# Patient Record
Sex: Female | Born: 1940 | Hispanic: No | Smoking: Never smoker
Health system: Southern US, Community
[De-identification: ages and names within clinical notes are randomized; demographics above are authoritative.]

## PROBLEM LIST (undated history)

## (undated) DIAGNOSIS — I10 Essential (primary) hypertension: Secondary | ICD-10-CM

## (undated) DIAGNOSIS — I2510 Atherosclerotic heart disease of native coronary artery without angina pectoris: Secondary | ICD-10-CM

## (undated) DIAGNOSIS — I421 Obstructive hypertrophic cardiomyopathy: Secondary | ICD-10-CM

## (undated) DIAGNOSIS — M109 Gout, unspecified: Secondary | ICD-10-CM

## (undated) DIAGNOSIS — E785 Hyperlipidemia, unspecified: Secondary | ICD-10-CM

## (undated) DIAGNOSIS — M069 Rheumatoid arthritis, unspecified: Secondary | ICD-10-CM

## (undated) DIAGNOSIS — R079 Chest pain, unspecified: Secondary | ICD-10-CM

## (undated) HISTORY — DX: Atherosclerotic heart disease of native coronary artery without angina pectoris: I25.10

## (undated) HISTORY — DX: Hyperlipidemia, unspecified: E78.5

## (undated) HISTORY — DX: Essential (primary) hypertension: I10

## (undated) HISTORY — DX: Obstructive hypertrophic cardiomyopathy: I42.1

## (undated) HISTORY — DX: Chest pain, unspecified: R07.9

---

## 2011-07-06 DIAGNOSIS — I10 Essential (primary) hypertension: Secondary | ICD-10-CM | POA: Diagnosis present

## 2011-08-11 DIAGNOSIS — Z008 Encounter for other general examination: Secondary | ICD-10-CM | POA: Insufficient documentation

## 2016-06-10 ENCOUNTER — Encounter (HOSPITAL_COMMUNITY): Payer: Self-pay | Admitting: Emergency Medicine

## 2016-06-10 ENCOUNTER — Emergency Department (HOSPITAL_COMMUNITY)
Admission: EM | Admit: 2016-06-10 | Discharge: 2016-06-10 | Disposition: A | Discharge status: Home / Self Care | Payer: Medicare Other | Attending: Emergency Medicine | Admitting: Emergency Medicine

## 2016-06-10 DIAGNOSIS — M2550 Pain in unspecified joint: Secondary | ICD-10-CM

## 2016-06-10 DIAGNOSIS — H579 Unspecified disorder of eye and adnexa: Secondary | ICD-10-CM | POA: Diagnosis present

## 2016-06-10 DIAGNOSIS — Z7982 Long term (current) use of aspirin: Secondary | ICD-10-CM | POA: Diagnosis not present

## 2016-06-10 DIAGNOSIS — H1031 Unspecified acute conjunctivitis, right eye: Secondary | ICD-10-CM | POA: Insufficient documentation

## 2016-06-10 DIAGNOSIS — Z79899 Other long term (current) drug therapy: Secondary | ICD-10-CM | POA: Diagnosis not present

## 2016-06-10 HISTORY — DX: Gout, unspecified: M10.9

## 2016-06-10 MED ORDER — NAPROXEN 500 MG PO TABS: 500.0000 mg | ORAL_TABLET | Freq: Two times a day (BID) | ORAL | 0 refills | Status: DC

## 2016-06-10 MED ORDER — FLUORESCEIN SODIUM 0.6 MG OP STRP
1.0000 | ORAL_STRIP | Freq: Once | OPHTHALMIC | Status: AC
  Administered 2016-06-10: 1 via OPHTHALMIC
  Filled 2016-06-10: qty 1

## 2016-06-10 MED ORDER — TETRACAINE HCL 0.5 % OP SOLN
2.0000 [drp] | Freq: Once | OPHTHALMIC | Status: AC
Start: 2016-06-10 — End: 2016-06-10
  Administered 2016-06-10: 2 [drp] via OPHTHALMIC
  Filled 2016-06-10: qty 4

## 2016-06-10 MED ORDER — TOBRAMYCIN 0.3 % OP SOLN
1.0000 [drp] | Freq: Once | OPHTHALMIC | Status: AC
  Administered 2016-06-10: 1 [drp] via OPHTHALMIC
  Filled 2016-06-10: qty 5

## 2016-06-10 MED ORDER — KETOROLAC TROMETHAMINE 0.5 % OP SOLN
1.0000 [drp] | Freq: Once | OPHTHALMIC | Status: AC
  Administered 2016-06-10: 1 [drp] via OPHTHALMIC
  Filled 2016-06-10: qty 5

## 2016-06-10 NOTE — ED Triage Notes (Signed)
Pt is with son-in-law and do not speak englishRedness to right eye, rates pain 5/10.  Also c/o joint pain (fingers, knees and wrist), rates pain 4-5/10

## 2016-06-10 NOTE — Discharge Instructions (Signed)
Warm wet compresses on/off to your eye.  Apply one drop of the ketorolac to the right eye 4 times a day and one drop of the tobramycin every 4 hrs for 5-7 days.  Avoid rubbing your eye.  Call the eye doctor listed to arrange a follow-up.  You can contact one of the doctors listed to establish a primary care provider.

## 2016-06-10 NOTE — ED Provider Notes (Signed)
[facility] DEPT Provider Note   CSN: [id number] Arrival date & time: 06/10/16  1191     History   Chief Complaint Chief Complaint  Patient presents with  . Conjunctivitis    HPI Dana [name] is a 75 y.o. female.  HPI  Dana [name] is a 75 y.o. female who presents to the Emergency Department complaining of redness, drainage and itching of the right eye.  Symptoms have been present for months, but worsening for one week.  Pt moved here from [place] and has not found a PCP yet.  She has not used any drops to her eye.  She states she has to pry her eye open every morning and reports some blurred vision.  She denies headaches, fever, dizziness or vision loss.  She also requests pain control for recurrent pain to her hands, shoulders and fingers.  States that she has taken mobic without relief.  Denies swelling or redness of the joints   Past Medical History:  Diagnosis Date  . Gout   . Heart beat abnormality     There are no active problems to display for this patient.   History reviewed. No pertinent surgical history.  OB History    Gravida Para Term Preterm AB Living   7         7   SAB TAB Ectopic Multiple Live Births                   Home Medications    Prior to Admission medications   Medication Sig Start Date End Date Taking? Authorizing Provider  allopurinol (ZYLOPRIM) 300 MG tablet Take 300 mg by mouth daily.   Yes Historical Provider, MD  aspirin EC 81 MG tablet Take 81 mg by mouth daily.   Yes Historical Provider, MD  Cholecalciferol (VITAMIN D) 2000 units CAPS Take by mouth.   Yes Historical Provider, MD  ezetimibe (ZETIA) 10 MG tablet Take 10 mg by mouth daily.   Yes Historical Provider, MD  furosemide (LASIX) 40 MG tablet Take 40 mg by mouth.   Yes Historical Provider, MD  meloxicam (MOBIC) 15 MG tablet Take 15 mg by mouth daily.   Yes Historical Provider, MD  metoprolol (LOPRESSOR) 100 MG tablet Take 100 mg by mouth 2 (two) times daily.   Yes  Historical Provider, MD  potassium chloride (K-DUR,KLOR-CON) 10 MEQ tablet Take 10 mEq by mouth daily.   Yes Historical Provider, MD    Family History History reviewed. No pertinent family history.  Social History Social History  Substance Use Topics  . Smoking status: Never Smoker  . Smokeless tobacco: Never Used  . Alcohol use No     Allergies   Patient has no known allergies.   Review of Systems Review of Systems  Constitutional: Negative for chills and fever.  Eyes: Positive for pain, discharge, redness, itching and visual disturbance.  Genitourinary: Negative for difficulty urinating and dysuria.  Musculoskeletal: Positive for arthralgias (pain to both shoulders, hands and fingers). Negative for joint swelling.  Skin: Negative for color change, rash and wound.  Neurological: Negative for dizziness, syncope, weakness and headaches.  All other systems reviewed and are negative.    Physical Exam Updated Vital Signs BP 122/77 (BP Location: Left Arm)   Pulse 64   Temp 98.6 F (37 C) (Oral)   Resp 17   SpO2 99%   Physical Exam  Constitutional: She is oriented to person, place, and time. She appears well-developed and well-nourished. No distress.  HENT:  Head: Normocephalic and atraumatic.  Eyes: EOM are normal. Pupils are equal, round, and reactive to light. Lids are everted and swept, no foreign bodies found. Right eye exhibits chemosis and discharge. Right eye exhibits no hordeolum. No foreign body present in the right eye. Left eye exhibits no discharge. Right conjunctiva is injected. Left conjunctiva is not injected.  Neck: Normal range of motion. Neck supple. No thyromegaly present.  Cardiovascular: Normal rate, regular rhythm and intact distal pulses.   No murmur heard. Pulmonary/Chest: Effort normal and breath sounds normal. No respiratory distress.  Musculoskeletal: Normal range of motion. She exhibits tenderness. She exhibits no edema or deformity.  ttp of  the DIP joints of the bilateral hands and bialteral wrists and shoulders.  No erythema or edema.  Sensation intact.    Lymphadenopathy:    She has no cervical adenopathy.  Neurological: She is alert and oriented to person, place, and time. She exhibits normal muscle tone. Coordination normal.  Skin: Skin is warm and dry. No rash noted.  Nursing note and vitals reviewed.    ED Treatments / Results  Labs (all labs ordered are listed, but only abnormal results are displayed) Labs Reviewed - No data to display  EKG  EKG Interpretation None       Radiology No results found.  Procedures Procedures (including critical care time)  Medications Ordered in ED Medications  tobramycin (TOBREX) 0.3 % ophthalmic solution 1 drop (not administered)  ketorolac (ACULAR) 0.5 % ophthalmic solution 1 drop (not administered)  tetracaine (PONTOCAINE) 0.5 % ophthalmic solution 2 drop (2 drops Right Eye Given by Other 06/10/16 1130)  fluorescein ophthalmic strip 1 strip (1 strip Right Eye Given by Other 06/10/16 1130)     Initial Impression / Assessment and Plan / ED Course  I have reviewed the triage vital signs and the nursing notes.  Pertinent labs & imaging results that were available during my care of the patient were reviewed by me and considered in my medical decision making (see chart for details).  Clinical Course    Pt with conjunctivitis of the right eye.  Chemosis likely from frequent rubbing of her eye.    IOP of the right measured with tonopen:  13, 13, 12 mm Hg  No FB's or evidence of hordeolum.  Dispensed Acular and tobramycin.  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  rx given for naprosyn.  Referral info for local PCP and ophtho    Final Clinical Impressions(s) / ED Diagnoses   Final diagnoses:  Acute conjunctivitis of right eye, unspecified acute conjunctivitis type  Arthralgia, unspecified joint    New Prescriptions New Prescriptions   No medications on file     Rosey Bath 06/12/16 2159    Donnetta Hutching, MD 06/17/16 [phone]

## 2016-07-06 ENCOUNTER — Other Ambulatory Visit (HOSPITAL_COMMUNITY): Payer: Self-pay | Admitting: Internal Medicine

## 2016-07-06 DIAGNOSIS — B351 Tinea unguium: Secondary | ICD-10-CM | POA: Insufficient documentation

## 2016-07-06 DIAGNOSIS — H5704 Mydriasis: Secondary | ICD-10-CM | POA: Insufficient documentation

## 2016-07-06 DIAGNOSIS — Z7982 Long term (current) use of aspirin: Secondary | ICD-10-CM | POA: Insufficient documentation

## 2016-07-06 DIAGNOSIS — Z1231 Encounter for screening mammogram for malignant neoplasm of breast: Secondary | ICD-10-CM

## 2016-07-06 DIAGNOSIS — R0989 Other specified symptoms and signs involving the circulatory and respiratory systems: Secondary | ICD-10-CM | POA: Insufficient documentation

## 2016-07-06 DIAGNOSIS — R600 Localized edema: Secondary | ICD-10-CM | POA: Insufficient documentation

## 2016-07-07 ENCOUNTER — Other Ambulatory Visit (HOSPITAL_COMMUNITY): Payer: Self-pay | Admitting: Internal Medicine

## 2016-07-07 DIAGNOSIS — E785 Hyperlipidemia, unspecified: Secondary | ICD-10-CM

## 2016-07-07 DIAGNOSIS — R079 Chest pain, unspecified: Secondary | ICD-10-CM

## 2016-07-07 DIAGNOSIS — I10 Essential (primary) hypertension: Secondary | ICD-10-CM

## 2016-07-11 ENCOUNTER — Ambulatory Visit (HOSPITAL_COMMUNITY)
Admission: RE | Admit: 2016-07-11 | Discharge: 2016-07-11 | Disposition: A | Discharge status: Home / Self Care | Payer: Medicare Other | Source: Ambulatory Visit | Attending: Internal Medicine | Admitting: Internal Medicine

## 2016-07-11 DIAGNOSIS — R079 Chest pain, unspecified: Secondary | ICD-10-CM | POA: Diagnosis not present

## 2016-07-11 DIAGNOSIS — I10 Essential (primary) hypertension: Secondary | ICD-10-CM | POA: Insufficient documentation

## 2016-07-11 DIAGNOSIS — E785 Hyperlipidemia, unspecified: Secondary | ICD-10-CM | POA: Diagnosis not present

## 2016-07-11 DIAGNOSIS — I6523 Occlusion and stenosis of bilateral carotid arteries: Secondary | ICD-10-CM | POA: Diagnosis not present

## 2016-07-11 DIAGNOSIS — R600 Localized edema: Secondary | ICD-10-CM | POA: Insufficient documentation

## 2016-07-11 DIAGNOSIS — R011 Cardiac murmur, unspecified: Secondary | ICD-10-CM | POA: Diagnosis not present

## 2016-07-11 DIAGNOSIS — R0989 Other specified symptoms and signs involving the circulatory and respiratory systems: Secondary | ICD-10-CM | POA: Insufficient documentation

## 2016-07-18 ENCOUNTER — Ambulatory Visit (HOSPITAL_COMMUNITY): Payer: Medicare Other

## 2016-09-19 ENCOUNTER — Encounter: Payer: Self-pay | Admitting: Cardiology

## 2016-09-19 NOTE — Progress Notes (Signed)
Cardiology Office Note  Date: 09/21/2016   ID: Dana [name], Dana [name] 10-27-1940, MRN [id number]  PCP: [name], MD  Consulting Cardiologist: Nona Dell, MD   Chief Complaint  Patient presents with  . Heart Murmur    History of Present Illness: Dana [name] is a 76 y.o. female referred for cardiology consultation by Dr. Selena Batten for evaluation of heart murmur. She is here with her son today. She is a native of [place] in the [place], most recently lived with family in [place]. This past fall she and family members moved to this area to be closer to one of their daughters. She does not speak English well, her son translates in their native language. I do not have any details about her cardiac history as yet, apparently she did follow with a cardiologist, and it sounds like based on the patient's sons very limited description that she may have had a diagnosed hypertrophic cardiomyopathy.  Patient apparently has a history of recurring chest pain, has been managed with high-dose beta blocker, and actually since she has moved to this area has been having fewer symptoms. She is not particularly active, has NYHA class II dyspnea with basic ADLs. She does not specifically endorse any palpitations or syncope.  I personally reviewed her ECG today which shows sinus bradycardia with IVCD of left bundle-branch block type as well as increased voltage.  Past Medical History:  Diagnosis Date  . Arthritis   . Essential hypertension   . Gout   . Hyperlipidemia     History reviewed. No pertinent surgical history.  Current Outpatient Prescriptions  Medication Sig Dispense Refill  . allopurinol (ZYLOPRIM) 300 MG tablet Take 300 mg by mouth daily.    [name] aspirin EC 81 MG tablet Take 81 mg by mouth daily.    . diphenhydramine-acetaminophen (TYLENOL PM) 25-500 MG TABS tablet Take 1 tablet by mouth at bedtime as needed.    . furosemide (LASIX) 40 MG tablet Take 40 mg by mouth.    .  methotrexate 2.5 MG tablet 4 tabs once a week    . metoprolol (LOPRESSOR) 100 MG tablet Take 100 mg by mouth 2 (two) times daily.    . nitroGLYCERIN (NITROSTAT) 0.4 MG SL tablet Place 0.4 mg under the tongue every 5 (five) minutes as needed for chest pain.    . predniSONE (STERAPRED UNI-PAK 21 TAB) 5 MG (21) TBPK tablet Take 5 mg by mouth daily.    [name] ezetimibe (ZETIA) 10 MG tablet Take 10 mg by mouth daily.    . meloxicam (MOBIC) 15 MG tablet Take 15 mg by mouth daily.    . potassium chloride (K-DUR,KLOR-CON) 10 MEQ tablet Take 10 mEq by mouth daily.     No current facility-administered medications for this visit.    Allergies:  Patient has no known allergies.   Social History: The patient  reports that she has never smoked. She has never used smokeless tobacco. She reports that she does not drink alcohol or use drugs.   Family History: The patient's family history includes Hypertension in her mother.   ROS:  Please see the history of present illness. Otherwise, complete review of systems is positive for arthritic pains.  All other systems are reviewed and negative.   Physical Exam: VS:  BP (!) 143/75 (BP Location: Right Arm, Cuff Size: Large)   Pulse (!) 53   Ht 5\' 2"  (1.575 m)   Wt 173 lb 9.6 oz (78.7 kg)   BMI 31.75  kg/m , BMI Body mass index is 31.75 kg/m.  Wt Readings from Last 3 Encounters:  09/21/16 173 lb 9.6 oz (78.7 kg)    General: Obese woman, appears comfortable at rest. HEENT: Conjunctiva and lids normal, oropharynx clear. Neck: Supple, no elevated JVP or carotid bruits, no thyromegaly. Lungs: Clear to auscultation, nonlabored breathing at rest. Cardiac: Regular rate and rhythm, no S3, 3/6 systolic murmur best heard at the right base, no pericardial rub. Abdomen: Soft, nontender, bowel sounds present, no guarding or rebound. Extremities: No pitting edema, distal pulses 2+. Skin: Warm and dry. Musculoskeletal: No kyphosis. Neuropsychiatric: Alert and oriented x3,  affect grossly appropriate.  ECG: No prior tracing available for comparison.  Recent Labwork:  February 2018: Cholesterol 169, triglycerides 87, HDL 47, LDL 105  Other Studies Reviewed Today:  Carotid Dopplers 07/11/2016: PRESSION: Minor carotid atherosclerosis. No hemodynamically significant ICA stenosis. Degree of narrowing less than 50% bilaterally.  Patent antegrade vertebral flow bilaterally  Assessment and Plan:  1. Heart murmur and potential history of hypertrophic cardiomyopathy. No records regarding prior cardiac workup available as yet, will be requested when the patient's son brings in this contact information. In the meanwhile we will obtain an echocardiogram to assess cardiac structure and function, can then decide about follow-up disposition.  2. Essential hypertension by history. She is currently on Lopressor, also takes Lasix with potassium supplements.  3. Hyperlipidemia, recent LDL 105. She is on Zetia.  4. History of arthritis and gout. Currently established with Dr. Selena Batten.  Current medicines were reviewed with the patient today.   Orders Placed This Encounter  Procedures  . EKG 12-Lead  . ECHOCARDIOGRAM COMPLETE    Disposition: Review records and determine follow-up plan.  Signed, Jonelle Sidle, MD, [facility] 09/21/2016 8:57 AM    [facility] at [facility] [address] [place], [place] [address] Phone: [phone]; Fax: [phone]

## 2016-09-21 ENCOUNTER — Encounter: Payer: Self-pay | Admitting: Cardiology

## 2016-09-21 ENCOUNTER — Ambulatory Visit (INDEPENDENT_AMBULATORY_CARE_PROVIDER_SITE_OTHER): Payer: Medicare Other | Admitting: Cardiology

## 2016-09-21 VITALS — BP 143/75 | HR 53 | Ht 62.0 in | Wt 173.6 lb

## 2016-09-21 DIAGNOSIS — I422 Other hypertrophic cardiomyopathy: Secondary | ICD-10-CM | POA: Diagnosis not present

## 2016-09-21 DIAGNOSIS — R011 Cardiac murmur, unspecified: Secondary | ICD-10-CM | POA: Diagnosis not present

## 2016-09-21 DIAGNOSIS — I10 Essential (primary) hypertension: Secondary | ICD-10-CM

## 2016-09-21 DIAGNOSIS — E782 Mixed hyperlipidemia: Secondary | ICD-10-CM | POA: Diagnosis not present

## 2016-09-21 NOTE — Patient Instructions (Signed)
Your physician recommends that you schedule a follow-up appointment to be determined   Your physician recommends that you continue on your current medications as directed. Please refer to the Current Medication list given to you today.  Your physician has requested that you have an echocardiogram. Echocardiography is a painless test that uses sound waves to create images of your heart. It provides your doctor with information about the size and shape of your heart and how well your heart's chambers and valves are working. This procedure takes approximately one hour. There are no restrictions for this procedure.  If you need a refill on your cardiac medications before your next appointment, please call your pharmacy.  Thank you for choosing [facility]!

## 2016-09-27 ENCOUNTER — Ambulatory Visit (HOSPITAL_COMMUNITY)
Admission: RE | Admit: 2016-09-27 | Discharge: 2016-09-27 | Disposition: A | Discharge status: Home / Self Care | Payer: Medicare Other | Source: Ambulatory Visit | Attending: Cardiology | Admitting: Cardiology

## 2016-09-27 DIAGNOSIS — I081 Rheumatic disorders of both mitral and tricuspid valves: Secondary | ICD-10-CM | POA: Diagnosis not present

## 2016-09-27 DIAGNOSIS — R011 Cardiac murmur, unspecified: Secondary | ICD-10-CM | POA: Diagnosis not present

## 2016-09-27 LAB — ECHOCARDIOGRAM COMPLETE
AO mean calculated velocity dopler: 237 cm/s
AOVTI: 92.2 cm
AV Mean grad: 30 mmHg
AV Peak grad: 83 mmHg
AVPKVEL: 455 cm/s
CHL CUP MV DEC (S): 278
CHL CUP STROKE VOLUME: 39 mL
E decel time: 278 msec
EERAT: 20.65
FS: 31 % (ref 28–44)
IV/PV OW: 1.4
LA diam end sys: 40 mm
LA diam index: 2.12 cm/m2
LA vol A4C: 62.9 ml
LASIZE: 40 mm
LAVOL: 60 mL
LAVOLIN: 31.8 mL/m2
LV E/e' medial: 20.65
LV TDI E'LATERAL: 4.78
LV e' LATERAL: 4.78 cm/s
LV sys vol index: 7 mL/m2
LV sys vol: 14 mL (ref 14–42)
LVDIAVOL: 53 mL (ref 46–106)
LVDIAVOLIN: 28 mL/m2
LVEEAVG: 20.65
LVOT area: 2.27 cm2
LVOT diameter: 17 mm
Lateral S' vel: 8.35 cm/s
MV Peak grad: 4 mmHg
MV pk E vel: 98.7 m/s
MVPKAVEL: 67.2 m/s
PW: 14.1 mm — AB (ref 0.6–1.1)
RV TAPSE: 15.9 mm
RV sys press: 28 mmHg
Reg peak vel: 251 cm/s
Simpson's disk: 74
TDI e' medial: 3.81
TR max vel: 251 cm/s

## 2016-09-27 NOTE — Progress Notes (Signed)
*  PRELIMINARY RESULTS* Echocardiogram 2D Echocardiogram has been performed.  Stacey Drain 09/27/2016, 1:35 PM

## 2016-12-01 ENCOUNTER — Ambulatory Visit (HOSPITAL_COMMUNITY): Payer: Medicare Other

## 2016-12-07 ENCOUNTER — Ambulatory Visit (HOSPITAL_COMMUNITY)
Admission: RE | Admit: 2016-12-07 | Discharge: 2016-12-07 | Disposition: A | Discharge status: Home / Self Care | Payer: Medicare Other | Source: Ambulatory Visit | Attending: Internal Medicine | Admitting: Internal Medicine

## 2016-12-07 DIAGNOSIS — Z1231 Encounter for screening mammogram for malignant neoplasm of breast: Secondary | ICD-10-CM

## 2016-12-09 ENCOUNTER — Other Ambulatory Visit (HOSPITAL_COMMUNITY): Payer: Self-pay | Admitting: Internal Medicine

## 2016-12-09 DIAGNOSIS — M79662 Pain in left lower leg: Secondary | ICD-10-CM

## 2016-12-12 ENCOUNTER — Ambulatory Visit (HOSPITAL_COMMUNITY)
Admission: RE | Admit: 2016-12-12 | Discharge: 2016-12-12 | Disposition: A | Discharge status: Home / Self Care | Payer: Medicare Other | Source: Ambulatory Visit | Attending: Internal Medicine | Admitting: Internal Medicine

## 2016-12-12 DIAGNOSIS — M79605 Pain in left leg: Secondary | ICD-10-CM | POA: Diagnosis not present

## 2016-12-12 DIAGNOSIS — M79662 Pain in left lower leg: Secondary | ICD-10-CM

## 2017-01-19 ENCOUNTER — Encounter (INDEPENDENT_AMBULATORY_CARE_PROVIDER_SITE_OTHER): Payer: Medicare Other | Admitting: Ophthalmology

## 2017-01-19 DIAGNOSIS — I10 Essential (primary) hypertension: Secondary | ICD-10-CM | POA: Diagnosis not present

## 2017-01-19 DIAGNOSIS — H33303 Unspecified retinal break, bilateral: Secondary | ICD-10-CM | POA: Diagnosis not present

## 2017-01-19 DIAGNOSIS — H35033 Hypertensive retinopathy, bilateral: Secondary | ICD-10-CM | POA: Diagnosis not present

## 2017-01-19 DIAGNOSIS — H43813 Vitreous degeneration, bilateral: Secondary | ICD-10-CM

## 2017-01-29 ENCOUNTER — Observation Stay (HOSPITAL_COMMUNITY)
Admission: EM | Admit: 2017-01-29 | Discharge: 2017-01-30 | Disposition: A | Discharge status: Home / Self Care | Payer: Medicare Other | Attending: Internal Medicine | Admitting: Internal Medicine

## 2017-01-29 ENCOUNTER — Emergency Department (HOSPITAL_COMMUNITY): Payer: Medicare Other

## 2017-01-29 ENCOUNTER — Encounter (HOSPITAL_COMMUNITY): Payer: Self-pay | Admitting: Cardiology

## 2017-01-29 DIAGNOSIS — M1990 Unspecified osteoarthritis, unspecified site: Secondary | ICD-10-CM | POA: Diagnosis present

## 2017-01-29 DIAGNOSIS — I422 Other hypertrophic cardiomyopathy: Secondary | ICD-10-CM

## 2017-01-29 DIAGNOSIS — R079 Chest pain, unspecified: Secondary | ICD-10-CM | POA: Diagnosis not present

## 2017-01-29 DIAGNOSIS — R110 Nausea: Secondary | ICD-10-CM | POA: Diagnosis not present

## 2017-01-29 DIAGNOSIS — Z79899 Other long term (current) drug therapy: Secondary | ICD-10-CM | POA: Insufficient documentation

## 2017-01-29 DIAGNOSIS — Z7982 Long term (current) use of aspirin: Secondary | ICD-10-CM | POA: Insufficient documentation

## 2017-01-29 DIAGNOSIS — D649 Anemia, unspecified: Secondary | ICD-10-CM | POA: Diagnosis present

## 2017-01-29 DIAGNOSIS — I10 Essential (primary) hypertension: Secondary | ICD-10-CM | POA: Diagnosis not present

## 2017-01-29 DIAGNOSIS — M109 Gout, unspecified: Secondary | ICD-10-CM | POA: Diagnosis present

## 2017-01-29 DIAGNOSIS — E782 Mixed hyperlipidemia: Secondary | ICD-10-CM | POA: Diagnosis present

## 2017-01-29 DIAGNOSIS — E785 Hyperlipidemia, unspecified: Secondary | ICD-10-CM

## 2017-01-29 DIAGNOSIS — R0602 Shortness of breath: Secondary | ICD-10-CM | POA: Insufficient documentation

## 2017-01-29 DIAGNOSIS — R42 Dizziness and giddiness: Secondary | ICD-10-CM | POA: Diagnosis not present

## 2017-01-29 DIAGNOSIS — I340 Nonrheumatic mitral (valve) insufficiency: Secondary | ICD-10-CM | POA: Diagnosis not present

## 2017-01-29 LAB — COMPREHENSIVE METABOLIC PANEL
ALT: 9 U/L — ABNORMAL LOW (ref 14–54)
AST: 18 U/L (ref 15–41)
Albumin: 3.4 g/dL — ABNORMAL LOW (ref 3.5–5.0)
Alkaline Phosphatase: 73 U/L (ref 38–126)
Anion gap: 7 (ref 5–15)
BILIRUBIN TOTAL: 0.7 mg/dL (ref 0.3–1.2)
BUN: 23 mg/dL — AB (ref 6–20)
CO2: 23 mmol/L (ref 22–32)
CREATININE: 0.77 mg/dL (ref 0.44–1.00)
Calcium: 8.9 mg/dL (ref 8.9–10.3)
Chloride: 105 mmol/L (ref 101–111)
Glucose, Bld: 166 mg/dL — ABNORMAL HIGH (ref 65–99)
POTASSIUM: 3.6 mmol/L (ref 3.5–5.1)
SODIUM: 135 mmol/L (ref 135–145)
TOTAL PROTEIN: 7.3 g/dL (ref 6.5–8.1)

## 2017-01-29 LAB — CBC WITH DIFFERENTIAL/PLATELET
Basophils Absolute: 0 10*3/uL (ref 0.0–0.1)
Basophils Relative: 1 %
EOS ABS: 0.1 10*3/uL (ref 0.0–0.7)
EOS PCT: 2 %
HCT: 32.1 % — ABNORMAL LOW (ref 36.0–46.0)
Hemoglobin: 10.5 g/dL — ABNORMAL LOW (ref 12.0–15.0)
LYMPHS ABS: 1.4 10*3/uL (ref 0.7–4.0)
Lymphocytes Relative: 26 %
MCH: 28.5 pg (ref 26.0–34.0)
MCHC: 32.7 g/dL (ref 30.0–36.0)
MCV: 87.2 fL (ref 78.0–100.0)
MONOS PCT: 6 %
Monocytes Absolute: 0.3 10*3/uL (ref 0.1–1.0)
Neutro Abs: 3.7 10*3/uL (ref 1.7–7.7)
Neutrophils Relative %: 67 %
PLATELETS: 287 10*3/uL (ref 150–400)
RBC: 3.68 MIL/uL — ABNORMAL LOW (ref 3.87–5.11)
RDW: 15.1 % (ref 11.5–15.5)
WBC: 5.5 10*3/uL (ref 4.0–10.5)

## 2017-01-29 LAB — I-STAT TROPONIN, ED: TROPONIN I, POC: 0 ng/mL (ref 0.00–0.08)

## 2017-01-29 LAB — TROPONIN I

## 2017-01-29 LAB — D-DIMER, QUANTITATIVE: D-Dimer, Quant: 4.69 ug/mL-FEU — ABNORMAL HIGH (ref 0.00–0.50)

## 2017-01-29 LAB — MAGNESIUM: MAGNESIUM: 2.1 mg/dL (ref 1.7–2.4)

## 2017-01-29 LAB — RETICULOCYTES
RBC.: 3.49 MIL/uL — ABNORMAL LOW (ref 3.87–5.11)
Retic Count, Absolute: 48.9 10*3/uL (ref 19.0–186.0)
Retic Ct Pct: 1.4 % (ref 0.4–3.1)

## 2017-01-29 LAB — BRAIN NATRIURETIC PEPTIDE: B Natriuretic Peptide: 372 pg/mL — ABNORMAL HIGH (ref 0.0–100.0)

## 2017-01-29 LAB — CBG MONITORING, ED: GLUCOSE-CAPILLARY: 179 mg/dL — AB (ref 65–99)

## 2017-01-29 LAB — TSH: TSH: 1.045 u[IU]/mL (ref 0.350–4.500)

## 2017-01-29 MED ORDER — ORAL CARE MOUTH RINSE
15.0000 mL | Freq: Two times a day (BID) | OROMUCOSAL | Status: DC
  Administered 2017-01-29: 15 mL via OROMUCOSAL

## 2017-01-29 MED ORDER — ATORVASTATIN CALCIUM 10 MG PO TABS: 10.0000 mg | ORAL_TABLET | Freq: Every day | ORAL | Status: DC

## 2017-01-29 MED ORDER — SODIUM CHLORIDE 0.9 % IV BOLUS (SEPSIS)
1000.0000 mL | Freq: Once | INTRAVENOUS | Status: AC
  Administered 2017-01-29: 1000 mL via INTRAVENOUS

## 2017-01-29 MED ORDER — ALLOPURINOL 300 MG PO TABS
300.0000 mg | ORAL_TABLET | Freq: Every day | ORAL | Status: DC
  Administered 2017-01-30: 300 mg via ORAL
  Filled 2017-01-29: qty 1

## 2017-01-29 MED ORDER — ASPIRIN 81 MG PO CHEW: 324.0000 mg | CHEWABLE_TABLET | Freq: Once | ORAL | Status: DC

## 2017-01-29 MED ORDER — ONDANSETRON HCL 4 MG/2ML IJ SOLN: 4.0000 mg | Freq: Four times a day (QID) | INTRAMUSCULAR | Status: DC | PRN

## 2017-01-29 MED ORDER — NITROGLYCERIN 0.4 MG SL SUBL: 0.4000 mg | SUBLINGUAL_TABLET | SUBLINGUAL | Status: DC | PRN

## 2017-01-29 MED ORDER — KETOROLAC TROMETHAMINE 15 MG/ML IJ SOLN: 15.0000 mg | Freq: Three times a day (TID) | INTRAMUSCULAR | Status: DC | PRN

## 2017-01-29 MED ORDER — ACETAMINOPHEN ER 650 MG PO TBCR: 650.0000 mg | EXTENDED_RELEASE_TABLET | Freq: Three times a day (TID) | ORAL | Status: DC | PRN

## 2017-01-29 MED ORDER — SODIUM CHLORIDE 0.9% FLUSH: 3.0000 mL | INTRAVENOUS | Status: DC | PRN

## 2017-01-29 MED ORDER — SODIUM CHLORIDE 0.9 % IV BOLUS (SEPSIS)
500.0000 mL | Freq: Once | INTRAVENOUS | Status: AC
  Administered 2017-01-29: 500 mL via INTRAVENOUS

## 2017-01-29 MED ORDER — MORPHINE SULFATE (PF) 2 MG/ML IV SOLN: 2.0000 mg | INTRAVENOUS | Status: DC | PRN

## 2017-01-29 MED ORDER — KETOROLAC TROMETHAMINE 30 MG/ML IJ SOLN
15.0000 mg | Freq: Once | INTRAMUSCULAR | Status: AC
  Administered 2017-01-29: 15 mg via INTRAVENOUS
  Filled 2017-01-29: qty 1

## 2017-01-29 MED ORDER — ACETAMINOPHEN 325 MG PO TABS: 650.0000 mg | ORAL_TABLET | ORAL | Status: DC | PRN

## 2017-01-29 MED ORDER — IOPAMIDOL (ISOVUE-370) INJECTION 76%
100.0000 mL | Freq: Once | INTRAVENOUS | Status: AC | PRN
  Administered 2017-01-29: 100 mL via INTRAVENOUS

## 2017-01-29 MED ORDER — METOPROLOL TARTRATE 50 MG PO TABS
100.0000 mg | ORAL_TABLET | Freq: Two times a day (BID) | ORAL | Status: DC
Start: 2017-01-29 — End: 2017-01-30
  Administered 2017-01-29 – 2017-01-30 (×2): 100 mg via ORAL
  Filled 2017-01-29 (×2): qty 2

## 2017-01-29 MED ORDER — PREDNISOLONE ACETATE 1 % OP SUSP
1.0000 [drp] | Freq: Two times a day (BID) | OPHTHALMIC | Status: DC
  Administered 2017-01-30: 1 [drp] via OPHTHALMIC
  Filled 2017-01-29: qty 1

## 2017-01-29 MED ORDER — ASPIRIN EC 81 MG PO TBEC
81.0000 mg | DELAYED_RELEASE_TABLET | Freq: Every day | ORAL | Status: DC
  Administered 2017-01-30: 81 mg via ORAL
  Filled 2017-01-29: qty 1

## 2017-01-29 MED ORDER — SODIUM CHLORIDE 0.9% FLUSH
3.0000 mL | Freq: Two times a day (BID) | INTRAVENOUS | Status: DC
  Administered 2017-01-29: 3 mL via INTRAVENOUS

## 2017-01-29 MED ORDER — ENOXAPARIN SODIUM 40 MG/0.4ML ~~LOC~~ SOLN
40.0000 mg | SUBCUTANEOUS | Status: DC
  Administered 2017-01-29: 40 mg via SUBCUTANEOUS
  Filled 2017-01-29: qty 0.4

## 2017-01-29 MED ORDER — CYCLOSPORINE 0.05 % OP EMUL
1.0000 [drp] | Freq: Two times a day (BID) | OPHTHALMIC | Status: DC
  Administered 2017-01-29 – 2017-01-30 (×2): 1 [drp] via OPHTHALMIC
  Filled 2017-01-29 (×2): qty 1

## 2017-01-29 MED ORDER — FOLIC ACID 1 MG PO TABS
1.0000 mg | ORAL_TABLET | Freq: Every day | ORAL | Status: DC
  Administered 2017-01-29 – 2017-01-30 (×2): 1 mg via ORAL
  Filled 2017-01-29 (×2): qty 1

## 2017-01-29 MED ORDER — FENTANYL CITRATE (PF) 100 MCG/2ML IJ SOLN
25.0000 ug | Freq: Once | INTRAMUSCULAR | Status: AC
  Administered 2017-01-29: 25 ug via INTRAVENOUS
  Filled 2017-01-29: qty 2

## 2017-01-29 MED ORDER — SODIUM CHLORIDE 0.9 % IV SOLN: 250.0000 mL | INTRAVENOUS | Status: DC | PRN

## 2017-01-29 MED ORDER — PANTOPRAZOLE SODIUM 40 MG PO TBEC
40.0000 mg | DELAYED_RELEASE_TABLET | Freq: Every day | ORAL | Status: DC
  Administered 2017-01-29 – 2017-01-30 (×2): 40 mg via ORAL
  Filled 2017-01-29 (×2): qty 1

## 2017-01-29 NOTE — ED Triage Notes (Signed)
Chest pain today that started after taking tylenol.  Pt took 6 baby aspirin at home.  EMS gave 1 SL NTG with pain relief.  B/p 86/38 after ntg. Rates pain 7/10 now. 12 lead EKG normal with EMS.

## 2017-01-29 NOTE — ED Provider Notes (Addendum)
[facility] DEPT Provider Note   CSN: [id number] Arrival date & time: 01/29/17  1221     History   Chief Complaint No chief complaint on file.   HPI Dana [name] is a 76 y.o. female.  HPI   76 year old female with past medical history of gout, hyperlipidemia, hypertension and history of multiple episodes of chest pain with her last cath last year that was reportedly clean without intervention but I do not have those records. She had acute onset of left-sided chest pain that was sharp and pressure about 5 minutes after eating and taking her medications. She immediately called EMS for help and they gave her nitroglycerin which relieved her pain a little bit. She is very taken 6 low dose aspirin today prior to arrival. Using her son as an interpreter she does endorse shortness of breath, nausea and lightheadedness but no diaphoresis with this episode. No history of blood clots. This is similar to previous episodes. No other associated modifying symptoms.  Past Medical History:  Diagnosis Date  . Arthritis   . Essential hypertension   . Gout   . Heart abnormality   . HTN (hypertension) 01/29/2017  . Hyperlipidemia     Patient Active Problem List   Diagnosis Date Noted  . Chest pain 01/29/2017  . Hypertrophic cardiomyopathy (HCC) 01/29/2017  . Moderate to severe mitral regurgitation 01/29/2017  . HTN (hypertension) 01/29/2017  . Hyperlipidemia 01/29/2017  . Gout 01/29/2017  . Arthritis 01/29/2017  . Anemia 01/29/2017    History reviewed. No pertinent surgical history.  OB History    Gravida Para Term Preterm AB Living   7         7   SAB TAB Ectopic Multiple Live Births                   Home Medications    Prior to Admission medications   Medication Sig Start Date End Date Taking? Authorizing Provider  acetaminophen (TYLENOL 8 HOUR ARTHRITIS PAIN) 650 MG CR tablet Take 650 mg by mouth every 8 (eight) hours as needed for pain.   Yes [provider]    allopurinol (ZYLOPRIM) 300 MG tablet Take 300 mg by mouth daily.   Yes [provider]  aspirin EC 81 MG tablet Take 81 mg by mouth daily.   Yes [provider]  atorvastatin (LIPITOR) 10 MG tablet Take 10 mg by mouth daily.   Yes [provider]  folic acid (FOLVITE) 1 MG tablet Take 1 mg by mouth daily.   Yes [provider]  furosemide (LASIX) 40 MG tablet Take 40 mg by mouth daily as needed for fluid.    Yes [provider]  metoprolol (LOPRESSOR) 100 MG tablet Take 100 mg by mouth 2 (two) times daily.   Yes [provider]  nitroGLYCERIN (NITROSTAT) 0.4 MG SL tablet Place 0.4 mg under the tongue every 5 (five) minutes as needed for chest pain.   Yes [provider]  prednisoLONE acetate (PRED FORTE) 1 % ophthalmic suspension PLACE ONE DROP INTO THE RIGHT EYE TWICE DAILY 01/20/17  Yes [provider]  RESTASIS MULTIDOSE 0.05 % ophthalmic emulsion PLACE ONE DROP IN BOTH EYES TWICE DAILY 12/26/16  Yes [provider]    Family History Family History  Problem Relation Age of Onset  . Hypertension Mother   . Liver disease Mother   . Alcohol abuse Father     Social History Social History  Substance Use Topics  . Smoking  status: Never Smoker  . Smokeless tobacco: Never Used  . Alcohol use No     Allergies   Patient has no known allergies.   Review of Systems Review of Systems  All other systems reviewed and are negative.    Physical Exam Updated Vital Signs BP 112/69 (BP Location: Right Arm)   Pulse 71   Temp 97.6 F (36.4 C) (Oral)   Resp 18   Ht 5\' 3"  (1.6 m)   Wt 78.5 kg (173 lb)   SpO2 100%   BMI 30.65 kg/m   Physical Exam  Constitutional: She is oriented to person, place, and time. She appears well-developed and well-nourished.  HENT:  Head: Normocephalic and atraumatic.  Eyes: Conjunctivae and EOM are normal.  Neck: Normal range of motion.  Cardiovascular: Normal rate and  regular rhythm.   Murmur heard. Pulmonary/Chest: Breath sounds normal. No stridor. Tachypnea noted. No respiratory distress. She has no wheezes.  Abdominal: Soft. She exhibits no distension.  Musculoskeletal: Normal range of motion. She exhibits tenderness (left chest reproducing her pain). She exhibits no edema.  Neurological: She is alert and oriented to person, place, and time. No cranial nerve deficit. Coordination normal.  Skin: Skin is warm and dry.  Nursing note and vitals reviewed.    ED Treatments / Results  Labs (all labs ordered are listed, but only abnormal results are displayed) Labs Reviewed  CBC WITH DIFFERENTIAL/PLATELET - Abnormal; Notable for the following:       Result Value   RBC 3.68 (*)    Hemoglobin 10.5 (*)    HCT 32.1 (*)    All other components within normal limits  COMPREHENSIVE METABOLIC PANEL - Abnormal; Notable for the following:    Glucose, Bld 166 (*)    BUN 23 (*)    Albumin 3.4 (*)    ALT 9 (*)    All other components within normal limits  D-DIMER, QUANTITATIVE (NOT AT [facility]) - Abnormal; Notable for the following:    D-Dimer, Quant 4.69 (*)    All other components within normal limits  BRAIN NATRIURETIC PEPTIDE - Abnormal; Notable for the following:    B Natriuretic Peptide 372.0 (*)    All other components within normal limits  RETICULOCYTES - Abnormal; Notable for the following:    RBC. 3.49 (*)    All other components within normal limits  CBG MONITORING, ED - Abnormal; Notable for the following:    Glucose-Capillary 179 (*)    All other components within normal limits  TROPONIN I  TROPONIN I  TSH  MAGNESIUM  CBC  TROPONIN I  VITAMIN B12  FOLATE  IRON AND TIBC  FERRITIN  BASIC METABOLIC PANEL  LIPID PANEL  CBC  PROTIME-INR  TROPONIN I  I-STAT TROPONIN, ED    EKG  EKG Interpretation  Date/Time:  Sunday January 29 2017 12:34:17 EDT Ventricular Rate:  86 PR Interval:    QRS Duration: 104 QT Interval:  400 QTC  Calculation: 479 R Axis:   19 Text Interpretation:  Sinus rhythm Probable left atrial enlargement RSR' in V1 or V2, right VCD or RVH LVH with secondary repolarization abnormality No old tracing to compare Confirmed by Marily Memos [phone]) on 01/29/2017 3:48:47 PM       Radiology Dg Chest 2 View  Result Date: 01/29/2017 CLINICAL DATA:  CHEST PAIN, Chest pain today that started after taking Tylenol. Pt took 6 baby aspirin at home PATIENT DOESN'T SPEAK ENGLISH, DAUGHTER INTERPRETED FOR HER, DAUGHTER STATES " CHEST PAIN  STARTED EARLIER TODAY, NO OTHER SYMPTOMS" H EXAM: CHEST  2 VIEW COMPARISON:  None. FINDINGS: The heart size and mediastinal contours are within normal limits. Both lungs are clear. Degenerative changes are seen in thoracic spine. IMPRESSION: No evidence for acute cardiopulmonary abnormality. Electronically Signed   By: Norva Pavlov M.D.   On: 01/29/2017 13:48   Ct Angio Chest Pe W And/or Wo Contrast  Result Date: 01/29/2017 CLINICAL DATA:  Chest pain today. Elevated D-dimer. History of hypertension. EXAM: CT ANGIOGRAPHY CHEST WITH CONTRAST TECHNIQUE: Multidetector CT imaging of the chest was performed using the standard protocol during bolus administration of intravenous contrast. Multiplanar CT image reconstructions and MIPs were obtained to evaluate the vascular anatomy. CONTRAST:  100 cc Isovue 370 COMPARISON:  None. FINDINGS: Cardiovascular: Study slightly limited by patient motion artifact but there is no pulmonary embolism identified within main, lobar or segmental pulmonary arteries bilaterally. No aortic aneurysm or dissection. Mild atherosclerotic change at the aortic arch. Mild cardiomegaly. No pericardial effusion. Coronary artery calcifications noted. Mediastinum/Nodes: Heterogeneously partially calcified mass within the right thyroid lobe measuring approximately 3 x 3 cm. Small lymph nodes within mediastinum, none of which appear to be pathologic by CT size criteria.  Additional small lymph nodes noted within the axillary regions bilaterally. Esophagus appears normal. Trachea and central bronchi are unremarkable. Lungs/Pleura: Patchy ground-glass opacities within each lung, suggesting edema. No pleural effusion or pneumothorax seen. Upper Abdomen: No acute abnormality. Musculoskeletal: Mild degenerative spurring within the thoracic spine. No acute or suspicious osseous finding. Review of the MIP images confirms the above findings. IMPRESSION: 1. No pulmonary embolism identified. 2. Mild cardiomegaly with perihilar and bibasilar edema suggesting mild CHF/volume overload. No evidence of pneumonia. 3. Coronary artery calcifications, particularly dense within the left anterior descending coronary artery. Recommend correlation with any possible associated cardiac symptoms. 4. Heterogeneous, partially calcified, mass within the right thyroid lobe measuring measuring 3 x 3 cm. Per consensus guidelines, nonemergent thyroid ultrasound is recommended. Aortic Atherosclerosis (ICD10-I70.0). Electronically Signed   By: Bary Richard M.D.   On: 01/29/2017 16:01    Procedures Procedures (including critical care time)  Medications Ordered in ED Medications  aspirin chewable tablet 324 mg (324 mg Oral Not Given 01/29/17 1254)  pantoprazole (PROTONIX) EC tablet 40 mg (40 mg Oral Given 01/29/17 1838)  atorvastatin (LIPITOR) tablet 10 mg (not administered)  folic acid (FOLVITE) tablet 1 mg (1 mg Oral Given 01/29/17 1838)  prednisoLONE acetate (PRED FORTE) 1 % ophthalmic suspension 1 drop (1 drop Right Eye Not Given 01/29/17 2112)  cycloSPORINE (RESTASIS) 0.05 % ophthalmic emulsion 1 drop (1 drop Both Eyes Given 01/29/17 2110)  allopurinol (ZYLOPRIM) tablet 300 mg (not administered)  aspirin EC tablet 81 mg (not administered)  metoprolol tartrate (LOPRESSOR) tablet 100 mg (100 mg Oral Given 01/29/17 2109)  nitroGLYCERIN (NITROSTAT) SL tablet 0.4 mg (not administered)  acetaminophen (TYLENOL)  tablet 650 mg (not administered)  ondansetron (ZOFRAN) injection 4 mg (not administered)  enoxaparin (LOVENOX) injection 40 mg (40 mg Subcutaneous Given 01/29/17 1838)  sodium chloride flush (NS) 0.9 % injection 3 mL (3 mLs Intravenous Given 01/29/17 2111)  sodium chloride flush (NS) 0.9 % injection 3 mL (not administered)  0.9 %  sodium chloride infusion (not administered)  ketorolac (TORADOL) 15 MG/ML injection 15 mg (not administered)  morphine 2 MG/ML injection 2 mg (not administered)  MEDLINE mouth rinse (15 mLs Mouth Rinse Given 01/29/17 2200)  sodium chloride 0.9 % bolus 500 mL (0 mLs Intravenous Stopped 01/29/17 1435)  ketorolac (TORADOL) 30 MG/ML injection 15 mg (15 mg Intravenous Given 01/29/17 1248)  sodium chloride 0.9 % bolus 1,000 mL (0 mLs Intravenous Stopped 01/29/17 1620)  fentaNYL (SUBLIMAZE) injection 25 mcg (25 mcg Intravenous Given 01/29/17 1518)  iopamidol (ISOVUE-370) 76 % injection 100 mL (100 mLs Intravenous Contrast Given 01/29/17 1529)     Initial Impression / Assessment and Plan / ED Course  I have reviewed the triage vital signs and the nursing notes.  Pertinent labs & imaging results that were available during my care of the patient were reviewed by me and considered in my medical decision making (see chart for details).  The recent clean cath makes ACS unlikely however with her age and risk factors we'll do a delta troponins. Also possible concern would be a pulmonary embolus so we'll do a d-dimer as she seemed to be low risk. It also be GI related with this close proximity to taking this pill and eating lunch but will rule out other emergencies first.  PE scan negative. Still with some pain but improving. BP's improved with fluids.  High risk for ACS with obesity, HTN, age, description of symptoms. Will d/w hospitalist regarding ACS rule out observation.       Final Clinical Impressions(s) / ED Diagnoses   Final diagnoses:  Chest pain, unspecified type        Marily Memos, MD 01/29/17 1443    Marily Memos, MD 01/29/17 [phone]

## 2017-01-29 NOTE — H&P (Signed)
History and Physical    Dana [name] MPN:[id number] DOB: 11-Jun-1941 DOA: 01/29/2017  PCP: [name], MD  Cardiologist: Dr. Diona Browner Patient coming from: Home  I have personally briefly reviewed patient's old medical records in [facility]  Chief Complaint: Chest pain/shortness of breath  HPI: Dana [name] is a 76 y.o. female native of [place] in the [place] who moved to [place] from [place] to be closer to her daughter's. Patient's daughter translates during the interview. Patient with medical history significant of hypertension, gout, hyperlipidemia, arthritis, hypertrophic cardiomyopathy with EF of 70%, with signs concerning for worsening ventricular outflow tract obstruction per cardiac catheterization of 08/07/2014 in [place]. 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 Patient is presenting to the ED with sudden onset left substernal chest pain with radiation to the left shoulder and left upper extremity described as sharp in nature and intermittent with some associated burning sensation as well which lasted about 2 minutes and then improved and subsequently reoccurred. Patient does endorse some shortness of breath as well as diaphoresis, palpitations, nausea, lightheadedness with these symptoms. EMS was called and per family patient given sibling 1 nitroglycerin which helped with the chest pain. Patient subsequently brought to the ED. Patient denies any fevers, no chills, no abdominal pain, no melena, no hematemesis, no hematochezia, no emesis, no recent long car trips, no recent surgeries.   ED Course: Patient seen in the ED given aspirin and noted to have systolic blood pressures initially in the 80s. Felt likely secondary to nitroglycerin. Patient given fluid boluses with improvement with systolic blood pressure now in the 120s. D-dimer which was done was elevated. First set of troponin was negative. Comprehensive metabolic profile  unremarkable. CBC had a hemoglobin of 10.5 otherwise was within normal limits. Chest x-ray done was unremarkable. CT angiogram chest negative for PE, mild cardiomegaly. Hila and bibasilar edema suggesting mild CHF/volume overload. Negative for pneumonia. Coronary artery calcifications particular dense within the LAD. Heterogeneous partially calcified mass within the right thyroid lobe. [facility] were called to admit the patient for further evaluation and management.  Review of Systems: As per HPI otherwise 10 point review of systems negative.   Past Medical History:  Diagnosis Date  . Arthritis   . Essential hypertension   . Gout   . Heart abnormality   . HTN (hypertension) 01/29/2017  . Hyperlipidemia     History reviewed. No pertinent surgical history.   reports that she has never smoked. She has never used smokeless tobacco. She reports that she does not drink alcohol or use drugs.  No Known Allergies  Family History  Problem Relation Age of Onset  . Hypertension Mother   . Liver disease Mother   . Alcohol abuse Father    Mother deceased age 45 from issues of the liver. Father deceased age 41 was noted to have a history of significant alcohol use. Cause of death unknown.  Prior to Admission medications   Medication Sig Start Date End Date Taking? Authorizing Provider  acetaminophen (TYLENOL 8 HOUR ARTHRITIS PAIN) 650 MG CR tablet Take 650 mg by mouth every 8 (eight) hours as needed for pain.   Yes [provider]  allopurinol (ZYLOPRIM) 300 MG tablet Take 300 mg by mouth daily.   Yes [provider]  aspirin EC 81 MG tablet Take 81 mg by mouth daily.   Yes [provider]  atorvastatin (LIPITOR) 10 MG tablet Take 10 mg by mouth daily.   Yes  [provider]  folic acid (FOLVITE) 1 MG tablet Take 1 mg by mouth daily.   Yes [provider]  furosemide (LASIX) 40 MG tablet Take 40 mg by mouth daily as needed for fluid.    Yes  [provider]  metoprolol (LOPRESSOR) 100 MG tablet Take 100 mg by mouth 2 (two) times daily.   Yes [provider]  nitroGLYCERIN (NITROSTAT) 0.4 MG SL tablet Place 0.4 mg under the tongue every 5 (five) minutes as needed for chest pain.   Yes [provider]  prednisoLONE acetate (PRED FORTE) 1 % ophthalmic suspension PLACE ONE DROP INTO THE RIGHT EYE TWICE DAILY 01/20/17  Yes [provider]  RESTASIS MULTIDOSE 0.05 % ophthalmic emulsion PLACE ONE DROP IN BOTH EYES TWICE DAILY 12/26/16  Yes [provider]    Physical Exam: Vitals:   01/29/17 1645 01/29/17 1700 01/29/17 1715 01/29/17 1730  BP: 135/89 135/76 135/84 126/81  Pulse: 85 83 87 81  Resp: (!) 9 18 (!) 34 16  Temp:      TempSrc:      SpO2: 97% 96% 97% 98%  Weight:      Height:        Constitutional: NAD, calm, comfortable Vitals:   01/29/17 1645 01/29/17 1700 01/29/17 1715 01/29/17 1730  BP: 135/89 135/76 135/84 126/81  Pulse: 85 83 87 81  Resp: (!) 9 18 (!) 34 16  Temp:      TempSrc:      SpO2: 97% 96% 97% 98%  Weight:      Height:       Eyes: PERRLA, EOMI, lids and conjunctivae normal ENMT: Mucous membranes are moist. Posterior pharynx clear of any exudate or lesions.Normal dentition.  Neck: normal, supple, no masses, no thyromegaly Respiratory: clear to auscultation bilaterally, no wheezing, no crackles. Normal respiratory effort. No accessory muscle use.  Cardiovascular: Regular rate and rhythm, with a grade 3/6 systolic ejection murmur heard at the base. No extremity edema. 2+ pedal pulses. No carotid bruits.  Abdomen: no tenderness, no masses palpated. No hepatosplenomegaly. Bowel sounds positive.  Musculoskeletal: no clubbing / cyanosis. No joint deformity upper and lower extremities. Good ROM, no contractures. Normal muscle tone.  Skin: no rashes, lesions, ulcers. No induration Neurologic: CN 2-12 grossly intact. Sensation intact, DTR normal. Strength 5/5 in all 4.   Psychiatric: Normal judgment and insight. Alert and oriented x 3. Normal mood.   Labs on Admission: I have personally reviewed following labs and imaging studies  CBC:  Recent Labs Lab 01/29/17 1251  WBC 5.5  NEUTROABS 3.7  HGB 10.5*  HCT 32.1*  MCV 87.2  PLT 287   Basic Metabolic Panel:  Recent Labs Lab 01/29/17 1251  NA 135  K 3.6  CL 105  CO2 23  GLUCOSE 166*  BUN 23*  CREATININE 0.77  CALCIUM 8.9   GFR: Estimated Creatinine Clearance: 60.2 mL/min (by C-G formula based on SCr of 0.77 mg/dL). Liver Function Tests:  Recent Labs Lab 01/29/17 1251  AST 18  ALT 9*  ALKPHOS 73  BILITOT 0.7  PROT 7.3  ALBUMIN 3.4*   No results for input(s): LIPASE, AMYLASE in the last 168 hours. No results for input(s): AMMONIA in the last 168 hours. Coagulation Profile: No results for input(s): INR, PROTIME in the last 168 hours. Cardiac Enzymes:  Recent Labs Lab 01/29/17 1251  TROPONINI <0.03   BNP (last 3 results) No results for input(s): PROBNP in the last 8760 hours. HbA1C: No results  for input(s): HGBA1C in the last 72 hours. CBG:  Recent Labs Lab 01/29/17 1248  GLUCAP 179*   Lipid Profile: No results for input(s): CHOL, HDL, LDLCALC, TRIG, CHOLHDL, LDLDIRECT in the last 72 hours. Thyroid Function Tests: No results for input(s): TSH, T4TOTAL, FREET4, T3FREE, THYROIDAB in the last 72 hours. Anemia Panel: No results for input(s): VITAMINB12, FOLATE, FERRITIN, TIBC, IRON, RETICCTPCT in the last 72 hours. Urine analysis: No results found for: COLORURINE, APPEARANCEUR, LABSPEC, PHURINE, GLUCOSEU, HGBUR, BILIRUBINUR, KETONESUR, PROTEINUR, UROBILINOGEN, NITRITE, LEUKOCYTESUR  Radiological Exams on Admission: Dg Chest 2 View  Result Date: 01/29/2017 CLINICAL DATA:  CHEST PAIN, Chest pain today that started after taking Tylenol. Pt took 6 baby aspirin at home PATIENT DOESN'T SPEAK ENGLISH, DAUGHTER INTERPRETED FOR HER, DAUGHTER STATES " CHEST PAIN STARTED  EARLIER TODAY, NO OTHER SYMPTOMS" H EXAM: CHEST  2 VIEW COMPARISON:  None. FINDINGS: The heart size and mediastinal contours are within normal limits. Both lungs are clear. Degenerative changes are seen in thoracic spine. IMPRESSION: No evidence for acute cardiopulmonary abnormality. Electronically Signed   By: Norva Pavlov M.D.   On: 01/29/2017 13:48   Ct Angio Chest Pe W And/or Wo Contrast  Result Date: 01/29/2017 CLINICAL DATA:  Chest pain today. Elevated D-dimer. History of hypertension. EXAM: CT ANGIOGRAPHY CHEST WITH CONTRAST TECHNIQUE: Multidetector CT imaging of the chest was performed using the standard protocol during bolus administration of intravenous contrast. Multiplanar CT image reconstructions and MIPs were obtained to evaluate the vascular anatomy. CONTRAST:  100 cc Isovue 370 COMPARISON:  None. FINDINGS: Cardiovascular: Study slightly limited by patient motion artifact but there is no pulmonary embolism identified within main, lobar or segmental pulmonary arteries bilaterally. No aortic aneurysm or dissection. Mild atherosclerotic change at the aortic arch. Mild cardiomegaly. No pericardial effusion. Coronary artery calcifications noted. Mediastinum/Nodes: Heterogeneously partially calcified mass within the right thyroid lobe measuring approximately 3 x 3 cm. Small lymph nodes within mediastinum, none of which appear to be pathologic by CT size criteria. Additional small lymph nodes noted within the axillary regions bilaterally. Esophagus appears normal. Trachea and central bronchi are unremarkable. Lungs/Pleura: Patchy ground-glass opacities within each lung, suggesting edema. No pleural effusion or pneumothorax seen. Upper Abdomen: No acute abnormality. Musculoskeletal: Mild degenerative spurring within the thoracic spine. No acute or suspicious osseous finding. Review of the MIP images confirms the above findings. IMPRESSION: 1. No pulmonary embolism identified. 2. Mild cardiomegaly with  perihilar and bibasilar edema suggesting mild CHF/volume overload. No evidence of pneumonia. 3. Coronary artery calcifications, particularly dense within the left anterior descending coronary artery. Recommend correlation with any possible associated cardiac symptoms. 4. Heterogeneous, partially calcified, mass within the right thyroid lobe measuring measuring 3 x 3 cm. Per consensus guidelines, nonemergent thyroid ultrasound is recommended. Aortic Atherosclerosis (ICD10-I70.0). Electronically Signed   By: Bary Richard M.D.   On: 01/29/2017 16:01    EKG: Independently reviewed. LVH with secondary repolarization  Assessment/Plan Principal Problem:   Chest pain Active Problems:   Hypertrophic cardiomyopathy (HCC)   Moderate to severe mitral regurgitation   HTN (hypertension)   Hyperlipidemia   Gout   Arthritis   Anemia   #1 chest pain Patient presenting with chest pain with both typical and atypical features. Patient with multiple risk factors of hypertension, hyperlipidemia, history of hypertropic cardiomyopathy with moderate mitral regurgitation. Cardiac catheterization from February 2016 with findings worrisome for worsening ventricular outflow tract obstruction. Patient with a significant murmur on examination. Patient also with symptoms that occurred after eating lunch  with some burning associated with it. Admit patient to telemetry. Cycle cardiac enzymes every 6 hours 3. Check a 2-D echo. Check a fasting lipid panel. Check a TSH. D-dimer which was done on admission was elevated however CT angiogram chest was negative for PE. Will continue patient's beta blocker, aspirin, Lipitor. Place on a PPI daily. Patient may benefit from a cardiac catheterization however will defer to cardiology. Consult with cardiology for further evaluation and management.  #2 hypertension Blood pressure initially on presentation to the ED was hypotensive with systolics in the 80s however responded to a fluid  bolus. Systolic blood pressure now in the 120s to the 130s. Resume home regimen of Lopressor. Hold Lasix for now and a blood pressure remained stable resume in the morning.  #3 moderate mitral regurgitation/Hypertrophic cardiomyopathy Repeat 2-D echo. Cardiology consultation for further evaluation to see if patient with a worsening moderate mitral regurgitation. Continue aspirin, statin, beta blocker. May resume diuretics hopefully tomorrow if blood pressure remained stable.  #4 anemia Questionable etiology. Patient with no overt bleeding. Check an anemia panel. Follow H&H. Transfusion threshold hemoglobin less than 8. Continue home regimen folic acid.  #5 gout Stable. Continue home regimen of allopurinol.  #6 arthritis Pain management.  DVT prophylaxis: Lovenox Code Status: Full Family Communication: Updated patient and daughter at bedside. Disposition Plan: Pending cardiac workup. Likely home once medically stable. Consults called: Cardiology Admission status: Place in observation.   [facility] MD [facility] Pager [phone]  If 7PM-7AM, please contact night-coverage [email or web address] Password [email or web address]  01/29/2017, 5:41 PM

## 2017-01-29 NOTE — ED Notes (Signed)
Pt transported to CT ?

## 2017-01-30 ENCOUNTER — Observation Stay (HOSPITAL_BASED_OUTPATIENT_CLINIC_OR_DEPARTMENT_OTHER): Payer: Medicare Other

## 2017-01-30 DIAGNOSIS — I371 Nonrheumatic pulmonary valve insufficiency: Secondary | ICD-10-CM

## 2017-01-30 DIAGNOSIS — I361 Nonrheumatic tricuspid (valve) insufficiency: Secondary | ICD-10-CM | POA: Diagnosis not present

## 2017-01-30 DIAGNOSIS — I340 Nonrheumatic mitral (valve) insufficiency: Secondary | ICD-10-CM | POA: Diagnosis not present

## 2017-01-30 DIAGNOSIS — R079 Chest pain, unspecified: Secondary | ICD-10-CM

## 2017-01-30 DIAGNOSIS — D649 Anemia, unspecified: Secondary | ICD-10-CM | POA: Diagnosis not present

## 2017-01-30 DIAGNOSIS — E785 Hyperlipidemia, unspecified: Secondary | ICD-10-CM | POA: Diagnosis not present

## 2017-01-30 DIAGNOSIS — I422 Other hypertrophic cardiomyopathy: Secondary | ICD-10-CM | POA: Diagnosis not present

## 2017-01-30 DIAGNOSIS — I10 Essential (primary) hypertension: Secondary | ICD-10-CM

## 2017-01-30 LAB — BASIC METABOLIC PANEL
ANION GAP: 8 (ref 5–15)
BUN: 28 mg/dL — ABNORMAL HIGH (ref 6–20)
CHLORIDE: 108 mmol/L (ref 101–111)
CO2: 23 mmol/L (ref 22–32)
CREATININE: 0.8 mg/dL (ref 0.44–1.00)
Calcium: 8.6 mg/dL — ABNORMAL LOW (ref 8.9–10.3)
GFR calc non Af Amer: >60 mL/min (ref >60)
Glucose, Bld: 100 mg/dL — ABNORMAL HIGH (ref 65–99)
POTASSIUM: 4.2 mmol/L (ref 3.5–5.1)
Sodium: 139 mmol/L (ref 135–145)

## 2017-01-30 LAB — IRON AND TIBC
IRON: 27 ug/dL — AB (ref 28–170)
SATURATION RATIOS: 9 % — AB (ref 10.4–31.8)
TIBC: 309 ug/dL (ref 250–450)
UIBC: 282 ug/dL

## 2017-01-30 LAB — TROPONIN I
Troponin I: 0.06 ng/mL (ref <0.03)
Troponin I: 0.05 ng/mL (ref <0.03)

## 2017-01-30 LAB — VITAMIN B12: VITAMIN B 12: 715 pg/mL (ref 180–914)

## 2017-01-30 LAB — PROTIME-INR
INR: 1.04
PROTHROMBIN TIME: 13.6 s (ref 11.4–15.2)

## 2017-01-30 LAB — NM MYOCAR MULTI W/SPECT W/WALL MOTION / EF
CHL CUP NUCLEAR SDS: 6
CHL CUP NUCLEAR SRS: 10
LV sys vol: 42 mL
LVDIAVOL: 92 mL (ref 46–106)
NUC STRESS TID: 0.93
Peak HR: 96 {beats}/min
RATE: 0.39
Rest HR: 64 {beats}/min
SSS: 16

## 2017-01-30 LAB — LIPID PANEL
CHOL/HDL RATIO: 2.7 ratio
Cholesterol: 112 mg/dL (ref 0–200)
HDL: 41 mg/dL (ref >40)
LDL CALC: 56 mg/dL (ref 0–99)
TRIGLYCERIDES: 74 mg/dL (ref <150)
VLDL: 15 mg/dL (ref 0–40)

## 2017-01-30 LAB — CBC
HCT: 30.3 % — ABNORMAL LOW (ref 36.0–46.0)
Hemoglobin: 9.8 g/dL — ABNORMAL LOW (ref 12.0–15.0)
MCH: 28.3 pg (ref 26.0–34.0)
MCHC: 32.3 g/dL (ref 30.0–36.0)
MCV: 87.6 fL (ref 78.0–100.0)
PLATELETS: 263 10*3/uL (ref 150–400)
RBC: 3.46 MIL/uL — ABNORMAL LOW (ref 3.87–5.11)
RDW: 15.2 % (ref 11.5–15.5)
WBC: 4.4 10*3/uL (ref 4.0–10.5)

## 2017-01-30 LAB — FOLATE: FOLATE: 14.4 ng/mL (ref >5.9)

## 2017-01-30 LAB — ECHOCARDIOGRAM COMPLETE
Height: 63 in
Weight: 2750.4 oz

## 2017-01-30 LAB — FERRITIN: Ferritin: 28 ng/mL (ref 11–307)

## 2017-01-30 MED ORDER — REGADENOSON 0.4 MG/5ML IV SOLN
INTRAVENOUS | Status: AC
  Administered 2017-01-30: 0.4 mg via INTRAVENOUS
  Filled 2017-01-30: qty 5

## 2017-01-30 MED ORDER — TECHNETIUM TC 99M TETROFOSMIN IV KIT
30.0000 | PACK | Freq: Once | INTRAVENOUS | Status: AC | PRN
  Administered 2017-01-30: 30 via INTRAVENOUS

## 2017-01-30 MED ORDER — SODIUM CHLORIDE 0.9% FLUSH
INTRAVENOUS | Status: AC
  Administered 2017-01-30: 10 mL via INTRAVENOUS
  Filled 2017-01-30: qty 10

## 2017-01-30 MED ORDER — PANTOPRAZOLE SODIUM 40 MG PO TBEC: 40.0000 mg | DELAYED_RELEASE_TABLET | Freq: Every day | ORAL | 0 refills | Status: DC

## 2017-01-30 MED ORDER — TECHNETIUM TC 99M TETROFOSMIN IV KIT
10.0000 | PACK | Freq: Once | INTRAVENOUS | Status: AC | PRN
  Administered 2017-01-30: 10 via INTRAVENOUS

## 2017-01-30 MED ORDER — VERAPAMIL HCL ER 240 MG PO TBCR: 120.0000 mg | EXTENDED_RELEASE_TABLET | Freq: Every day | ORAL | Status: DC

## 2017-01-30 MED ORDER — TECHNETIUM TC 99M SESTAMIBI - CARDIOLITE: 30.0000 | Freq: Once | INTRAVENOUS | Status: DC | PRN

## 2017-01-30 MED ORDER — REGADENOSON 0.4 MG/5ML IV SOLN
0.4000 mg | Freq: Once | INTRAVENOUS | Status: AC
  Administered 2017-01-30: 0.4 mg via INTRAVENOUS
  Filled 2017-01-30: qty 5

## 2017-01-30 MED ORDER — VERAPAMIL HCL ER 120 MG PO TBCR: 120.0000 mg | EXTENDED_RELEASE_TABLET | Freq: Every day | ORAL | 0 refills | Status: DC

## 2017-01-30 NOTE — Consult Note (Signed)
Cardiology Consultation:   Patient IDShamera [name]; [id number]; Aug 25, 1940   Admit date: 01/29/2017 Date of Consult: 01/30/2017  Primary Care Provider: [name], MD Primary Cardiologist: [facility] Primary Electrophysiologist:  None   Patient Profile:   Dana [name] is a 76 y.o. female with a hx of HOCM who is being seen today for the evaluation of chest pain  at the request of Dana [name].  History of Present Illness:   Dana [name] 76 y.o. does not speak [place] From [place]. CRF; HTN and elevated lipids. She clearly indicates she is not having chest pain this am but other than that I cannot get any history this am.  Admitted yesterday with sudden onset left substernal chest pian. Radiation to left shoulder Only lasted minutes Some diaphoresis and dyspnea Denies rapid palpitations BP was low and responded to fluid D dimer negative No syncope Had cath in [place] 2/11/6 with no obstructive disease Echo done 09/27/16 with severe LVH and LVOT velocity under 33m/sec with some ambiguity due to MR signal   Past Medical History:  Diagnosis Date  . Arthritis   . Essential hypertension   . Gout   . Heart abnormality   . HTN (hypertension) 01/29/2017  . Hyperlipidemia     History reviewed. No pertinent surgical history.   Inpatient Medications: Scheduled Meds: . allopurinol  300 mg Oral Daily  . aspirin  324 mg Oral Once  . aspirin EC  81 mg Oral Daily  . atorvastatin  10 mg Oral q1800  . cycloSPORINE  1 drop Both Eyes BID  . enoxaparin (LOVENOX) injection  40 mg Subcutaneous Q24H  . folic acid  1 mg Oral Daily  . mouth rinse  15 mL Mouth Rinse BID  . metoprolol tartrate  100 mg Oral BID  . pantoprazole  40 mg Oral Q0600  . prednisoLONE acetate  1 drop Right Eye BID  . regadenoson  0.4 mg Intravenous Once  . sodium chloride flush  3 mL Intravenous Q12H   Continuous Infusions: . sodium chloride     PRN Meds: sodium chloride, acetaminophen, ketorolac, morphine injection, nitroGLYCERIN,  ondansetron (ZOFRAN) IV, sodium chloride flush, technetium sestamibi  Allergies:   No Known Allergies  Social History:   Social History   Social History  . Marital status: Widowed    Spouse name: N/A  . Number of children: N/A  . Years of education: N/A   Occupational History  . Not on file.   Social History Main Topics  . Smoking status: Never Smoker  . Smokeless tobacco: Never Used  . Alcohol use No  . Drug use: No  . Sexual activity: Not on file   Other Topics Concern  . Not on file   Social History Narrative  . No narrative on file    Family History:   Family History  Problem Relation Age of Onset  . Hypertension Mother   . Liver disease Mother   . Alcohol abuse Father      ROS:  Please see the history of present illness.  ROS  All other ROS reviewed and negative.     Physical Exam/Data:   Vitals:   01/29/17 2105 01/30/17 0451 01/30/17 0455 01/30/17 0552  BP:  (!) 179/89 (!) 146/87   Pulse:  (!) 53 (!) 55   Resp:  18    Temp:  97.7 F (36.5 C)    TempSrc:  Oral    SpO2: 96% 98%    Weight:    171 lb 14.4  oz (78 kg)  Height:        Intake/Output Summary (Last 24 hours) at 01/30/17 0819 Last data filed at 01/30/17 0600  Gross per 24 hour  Intake              246 ml  Output                0 ml  Net              246 ml   Filed Weights   01/29/17 1241 01/30/17 0552  Weight: 173 lb (78.5 kg) 171 lb 14.4 oz (78 kg)   Body mass index is 30.45 kg/m.  General:  Well nourished, well developed, in no acute distress  HEENT: normal Lymph: no adenopathy Neck: no JVD Endocrine:  No thryomegaly Vascular: No carotid bruits; FA pulses 2+ bilaterally without bruits  Cardiac:  normal S1, S2; RRR;  SEM in LVOT area  Lungs:  clear to auscultation bilaterally, no wheezing, rhonchi or rales  Abd: soft, nontender, no hepatomegaly  Ext: no edema Musculoskeletal:  No deformities, BUE and BLE strength normal and equal Skin: warm and dry  Neuro:  CNs 2-12  intact, no focal abnormalities noted Psych:  Normal affect   EKG:  The EKG was personally reviewed and demonstrates:   SR LVH with strain Telemetry:  Telemetry was personally reviewed and demonstrates:  NSR no VT  Relevant CV Studies: Echo 4/18 HOCM mild LVOT gardient  Laboratory Data:  Chemistry Recent Labs Lab 01/29/17 1251 01/30/17 0427  NA 135 139  K 3.6 4.2  CL 105 108  CO2 23 23  GLUCOSE 166* 100*  BUN 23* 28*  CREATININE 0.77 0.80  CALCIUM 8.9 8.6*  GFRNONAA >60 >60  GFRAA >60 >60  ANIONGAP 7 8     Recent Labs Lab 01/29/17 1251  PROT 7.3  ALBUMIN 3.4*  AST 18  ALT 9*  ALKPHOS 73  BILITOT 0.7   Hematology Recent Labs Lab 01/29/17 1251 01/29/17 2022 01/30/17 0427  WBC 5.5  --  4.4  RBC 3.68* 3.49* 3.46*  HGB 10.5*  --  9.8*  HCT 32.1*  --  30.3*  MCV 87.2  --  87.6  MCH 28.5  --  28.3  MCHC 32.7  --  32.3  RDW 15.1  --  15.2  PLT 287  --  263   Cardiac Enzymes Recent Labs Lab 01/29/17 1251 01/29/17 2022 01/30/17 0427  TROPONINI <0.03 <0.03 0.06*    Recent Labs Lab 01/29/17 1322  TROPIPOC 0.00    BNP Recent Labs Lab 01/29/17 1305  BNP 372.0*    DDimer  Recent Labs Lab 01/29/17 1251  DDIMER 4.69*    Radiology/Studies:  Dg Chest 2 View  Result Date: 01/29/2017 CLINICAL DATA:  CHEST PAIN, Chest pain today that started after taking Tylenol. Pt took 6 baby aspirin at home PATIENT DOESN'T SPEAK ENGLISH, DAUGHTER INTERPRETED FOR HER, DAUGHTER STATES " CHEST PAIN STARTED EARLIER TODAY, NO OTHER SYMPTOMS" H EXAM: CHEST  2 VIEW COMPARISON:  None. FINDINGS: The heart size and mediastinal contours are within normal limits. Both lungs are clear. Degenerative changes are seen in thoracic spine. IMPRESSION: No evidence for acute cardiopulmonary abnormality. Electronically Signed   By: Norva Pavlov M.D.   On: 01/29/2017 13:48   Ct Angio Chest Pe W And/or Wo Contrast  Result Date: 01/29/2017 CLINICAL DATA:  Chest pain today. Elevated  D-dimer. History of hypertension. EXAM: CT ANGIOGRAPHY CHEST WITH CONTRAST TECHNIQUE: Multidetector CT  imaging of the chest was performed using the standard protocol during bolus administration of intravenous contrast. Multiplanar CT image reconstructions and MIPs were obtained to evaluate the vascular anatomy. CONTRAST:  100 cc Isovue 370 COMPARISON:  None. FINDINGS: Cardiovascular: Study slightly limited by patient motion artifact but there is no pulmonary embolism identified within main, lobar or segmental pulmonary arteries bilaterally. No aortic aneurysm or dissection. Mild atherosclerotic change at the aortic arch. Mild cardiomegaly. No pericardial effusion. Coronary artery calcifications noted. Mediastinum/Nodes: Heterogeneously partially calcified mass within the right thyroid lobe measuring approximately 3 x 3 cm. Small lymph nodes within mediastinum, none of which appear to be pathologic by CT size criteria. Additional small lymph nodes noted within the axillary regions bilaterally. Esophagus appears normal. Trachea and central bronchi are unremarkable. Lungs/Pleura: Patchy ground-glass opacities within each lung, suggesting edema. No pleural effusion or pneumothorax seen. Upper Abdomen: No acute abnormality. Musculoskeletal: Mild degenerative spurring within the thoracic spine. No acute or suspicious osseous finding. Review of the MIP images confirms the above findings. IMPRESSION: 1. No pulmonary embolism identified. 2. Mild cardiomegaly with perihilar and bibasilar edema suggesting mild CHF/volume overload. No evidence of pneumonia. 3. Coronary artery calcifications, particularly dense within the left anterior descending coronary artery. Recommend correlation with any possible associated cardiac symptoms. 4. Heterogeneous, partially calcified, mass within the right thyroid lobe measuring measuring 3 x 3 cm. Per consensus guidelines, nonemergent thyroid ultrasound is recommended. Aortic Atherosclerosis  (ICD10-I70.0). Electronically Signed   By: Bary Richard M.D.   On: 01/29/2017 16:01    Assessment and Plan:   1. Chest Pain: resolved r/o ECG chronic LVH with strain from HOCM. Have ordered myovue for this am 2. HOCM:  Avoid dehydration continue beta blocker f/u echo for gradients pending suspect SSCP may be from this with microvascular angina Add low dose verapamil Will arrange outpatient f/u with Dr Revonda Humphrey to d/c home if myovue normal    Signed, Charlton Haws, MD  01/30/2017 8:19 AM

## 2017-01-30 NOTE — Progress Notes (Signed)
Dr. Onalee Hua text paged for notification of Critical Troponin 0.06. Patient denies chest pain. No distress noted at this time.

## 2017-01-30 NOTE — Discharge Summary (Signed)
Physician Discharge Summary  Dana [name] FTD:[id number] DOB: Nov 17, 1940 DOA: 01/29/2017  PCP: [name], MD  Admit date: 01/29/2017 Discharge date: 01/30/2017  Time spent: 60 minutes  Recommendations for Outpatient Follow-up:  1. Follow-up with [name], MD in 1-2 weeks. 2. Follow-up with Dr. Diona Browner, cardiology in 2 weeks.   Discharge Diagnoses:  Principal Problem:   Chest pain Active Problems:   Hypertrophic cardiomyopathy (HCC)   MR (mitral regurgitation)   HTN (hypertension)   Hyperlipidemia   Gout   Arthritis   Anemia   Discharge Condition: Stable and improved  Diet recommendation: Heart healthy  Filed Weights   01/29/17 1241 01/30/17 0552  Weight: 78.5 kg (173 lb) 78 kg (171 lb 14.4 oz)    History of present illness:   Dana [name] is a 76 y.o. female native of [place] in the [place] who moved to [place] from [place] to be closer to her daughter's. Patient's daughter translates during the interview. Patient with medical history significant of hypertension, gout, hyperlipidemia, arthritis, hypertrophic cardiomyopathy with EF of 70%, with signs concerning for worsening ventricular outflow tract obstruction per cardiac catheterization of 08/07/2014 in [place]. 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 Patient presented to the ED with sudden onset left substernal chest pain with radiation to the left shoulder and left upper extremity described as sharp in nature and intermittent with some associated burning sensation as well which lasted about 2 minutes and then improved and subsequently reoccurred. Patient did endorse some shortness of breath as well as diaphoresis, palpitations, nausea, lightheadedness with these symptoms. EMS was called and per family patient given SL 1 nitroglycerin which helped with the chest pain. Patient subsequently brought to the ED. Patient denied any fevers, no chills, no abdominal pain, no  melena, no hematemesis, no hematochezia, no emesis, no recent long car trips, no recent surgeries.   ED Course: Patient seen in the ED given aspirin and noted to have systolic blood pressures initially in the 80s. Felt likely secondary to nitroglycerin. Patient given fluid boluses with improvement with systolic blood pressure now in the 120s. D-dimer which was done was elevated. First set of troponin was negative. Comprehensive metabolic profile unremarkable. CBC had a hemoglobin of 10.5 otherwise was within normal limits. Chest x-ray done was unremarkable. CT angiogram chest negative for PE, mild cardiomegaly. Hila and bibasilar edema suggesting mild CHF/volume overload. Negative for pneumonia. Coronary artery calcifications particular dense within the LAD. Heterogeneous partially calcified mass within the right thyroid lobe. [facility] were called to admit the patient for further evaluation and management.  Hospital Course:  #1 chest pain Patient presented with chest pain with both typical and atypical features. Patient with multiple risk factors of hypertension, hyperlipidemia, history of hypertropic cardiomyopathy with moderate mitral regurgitation. Cardiac catheterization from February 2016 with findings worrisome for worsening ventricular outflow tract obstruction. Patient with a significant murmur on examination. Patient also with symptoms that occurred after eating lunch with some burning associated with it.  Patient was admitted placed on telemetry and cardiac enzymes were cycled which were minimally elevated and seemed to have plateaued. 2-D echo was obtained with EF of 65-70%, severe basal septal hypertrophy, findings consistent with  HOCM > SAM LVOT gradient heart to differentiate from MR signal but velocity likely to 2-62m/sec. TSH was obtained which was within normal limits.  D-dimer which was done on admission was elevated however CT angiogram chest was negative for PE. Fasting lipid  panel obtained had a LDL  of 56 total cholesterol of 112 and HDL of 41. Patient did not have any further chest pain during the hospitalization. Patient was maintained on home regimen of beta blocker, aspirin, Lipitor. PPI was added to patient's regimen. Cardiology consulted and patient underwent a Myoview stress test which was normal. Was recommended per cardiology to add low-dose verapamil to patient's regimen and patient to follow-up with cardiology, Dr. Diona Browner in the outpatient setting. Patient will be discharged in stable and improved condition.   #2 hypertension Blood pressure initially on presentation to the ED was hypotensive with systolics in the 80s however responded to a fluid bolus. Systolic blood pressure remained stable. Patient was resumed back on home regimen of Lopressor. Patient's diuretics were held. Patient's blood pressure improved. Outpatient follow-up.   #3 mitral regurgitation/Hypertrophic cardiomyopathy Repeat 2-D echo was done with a EF of 65-70%, findings consistent with HOCM > SAM LVOT gradient are to differentiate from MR signal but likely velocities between 2-7m/sec. Severe basal septal hypertrophy. Systolic function was vigorous. Patient was seen in consultation by cardiology who recommended to continue patient on beta blocker with addition of low-dose verapamil. Myoview stress rest was done which was a low risk study and normal. Was recommended that patient continue on aspirin, statin and follow up with Dr. Diona Browner of cardiology in the outpatient setting.  #4 anemia Questionable etiology. Patient with no overt bleeding. Patient's hemoglobin remained stable with no overt bleeding during the hospitalization. Anemia panel done had a iron level of 27 ferritin of 28 folate of 14.4 B-12 levels of 715. Outpatient follow-up with PCP.  #5 gout Stable. Patient was maintained on home regimen of allopurinol.  #6 arthritis Pain management.  Procedures:  2-D echo  01/30/2017  Chest x-ray 01/29/2017  Myoview stress test 01/30/2017----low risk study. Left ventricular EF mildly decreased 45-54%, there was no ST segment deviation noted during test. Significant bowel artifact obscures some of the inferior lateral wall. No ischemia. EF 54%. Low risk study.  Consultations:  Cardiology: Dr. Eden Emms 01/30/2017  Discharge Exam: Vitals:   01/30/17 0455 01/30/17 1447  BP: (!) 146/87 (!) 158/65  Pulse: (!) 55 74  Resp:  18  Temp:  97.7 F (36.5 C)    General: NAD Cardiovascular: RRR Respiratory: CTAB  Discharge Instructions   Discharge Instructions    Diet - low sodium heart healthy    Complete by:  As directed    Discharge instructions    Complete by:  As directed    Avoid dehydration.   Increase activity slowly    Complete by:  As directed      Current Discharge Medication List    START taking these medications   Details  pantoprazole (PROTONIX) 40 MG tablet Take 1 tablet (40 mg total) by mouth daily at 6 (six) AM. Qty: 30 tablet, Refills: 0    verapamil (CALAN-SR) 120 MG CR tablet Take 1 tablet (120 mg total) by mouth daily. Qty: 30 tablet, Refills: 0      CONTINUE these medications which have NOT CHANGED   Details  acetaminophen (TYLENOL 8 HOUR ARTHRITIS PAIN) 650 MG CR tablet Take 650 mg by mouth every 8 (eight) hours as needed for pain.    allopurinol (ZYLOPRIM) 300 MG tablet Take 300 mg by mouth daily.    aspirin EC 81 MG tablet Take 81 mg by mouth daily.    atorvastatin (LIPITOR) 10 MG tablet Take 10 mg by mouth daily.    folic acid (FOLVITE) 1 MG tablet Take 1 mg  by mouth daily.    furosemide (LASIX) 40 MG tablet Take 40 mg by mouth daily as needed for fluid.     metoprolol (LOPRESSOR) 100 MG tablet Take 100 mg by mouth 2 (two) times daily.    nitroGLYCERIN (NITROSTAT) 0.4 MG SL tablet Place 0.4 mg under the tongue every 5 (five) minutes as needed for chest pain.    prednisoLONE acetate (PRED FORTE) 1 % ophthalmic  suspension PLACE ONE DROP INTO THE RIGHT EYE TWICE DAILY Refills: 1    RESTASIS MULTIDOSE 0.05 % ophthalmic emulsion PLACE ONE DROP IN BOTH EYES TWICE DAILY Refills: 4       No Known Allergies Follow-up Information    [name], MD. Schedule an appointment as soon as possible for a visit in 2 week(s).   Specialty:  Internal Medicine Why:  f/u in 1-2 weeks. Contact information: [address] [place] [address] [phone]        Jonelle Sidle, MD. Schedule an appointment as soon as possible for a visit in 2 week(s).   Specialty:  Cardiology Contact information: [address] [place] [address] [phone]            The results of significant diagnostics from this hospitalization (including imaging, microbiology, ancillary and laboratory) are listed below for reference.    Significant Diagnostic Studies: Dg Chest 2 View  Result Date: 01/29/2017 CLINICAL DATA:  CHEST PAIN, Chest pain today that started after taking Tylenol. Pt took 6 baby aspirin at home PATIENT DOESN'T SPEAK ENGLISH, DAUGHTER INTERPRETED FOR HER, DAUGHTER STATES " CHEST PAIN STARTED EARLIER TODAY, NO OTHER SYMPTOMS" H EXAM: CHEST  2 VIEW COMPARISON:  None. FINDINGS: The heart size and mediastinal contours are within normal limits. Both lungs are clear. Degenerative changes are seen in thoracic spine. IMPRESSION: No evidence for acute cardiopulmonary abnormality. Electronically Signed   By: Norva Pavlov M.D.   On: 01/29/2017 13:48   Ct Angio Chest Pe W And/or Wo Contrast  Result Date: 01/29/2017 CLINICAL DATA:  Chest pain today. Elevated D-dimer. History of hypertension. EXAM: CT ANGIOGRAPHY CHEST WITH CONTRAST TECHNIQUE: Multidetector CT imaging of the chest was performed using the standard protocol during bolus administration of intravenous contrast. Multiplanar CT image reconstructions and MIPs were obtained to evaluate the vascular anatomy. CONTRAST:  100 cc Isovue 370  COMPARISON:  None. FINDINGS: Cardiovascular: Study slightly limited by patient motion artifact but there is no pulmonary embolism identified within main, lobar or segmental pulmonary arteries bilaterally. No aortic aneurysm or dissection. Mild atherosclerotic change at the aortic arch. Mild cardiomegaly. No pericardial effusion. Coronary artery calcifications noted. Mediastinum/Nodes: Heterogeneously partially calcified mass within the right thyroid lobe measuring approximately 3 x 3 cm. Small lymph nodes within mediastinum, none of which appear to be pathologic by CT size criteria. Additional small lymph nodes noted within the axillary regions bilaterally. Esophagus appears normal. Trachea and central bronchi are unremarkable. Lungs/Pleura: Patchy ground-glass opacities within each lung, suggesting edema. No pleural effusion or pneumothorax seen. Upper Abdomen: No acute abnormality. Musculoskeletal: Mild degenerative spurring within the thoracic spine. No acute or suspicious osseous finding. Review of the MIP images confirms the above findings. IMPRESSION: 1. No pulmonary embolism identified. 2. Mild cardiomegaly with perihilar and bibasilar edema suggesting mild CHF/volume overload. No evidence of pneumonia. 3. Coronary artery calcifications, particularly dense within the left anterior descending coronary artery. Recommend correlation with any possible associated cardiac symptoms. 4. Heterogeneous, partially calcified, mass within the right thyroid lobe measuring measuring  3 x 3 cm. Per consensus guidelines, nonemergent thyroid ultrasound is recommended. Aortic Atherosclerosis (ICD10-I70.0). Electronically Signed   By: Bary Richard M.D.   On: 01/29/2017 16:01   Nm Myocar Multi W/spect W/wall Motion / Ef  Result Date: 01/30/2017  This is a low risk study.  The left ventricular ejection fraction is mildly decreased (45-54%).  There was no ST segment deviation noted during stress.  Significant bowel artifact  obscures some of the inferior and lateral wall No ischemia EF 54% low risk study    Microbiology: No results found for this or any previous visit (from the past 240 hour(s)).   Labs: Basic Metabolic Panel:  Recent Labs Lab 01/29/17 1251 01/29/17 2022 01/30/17 0427  NA 135  --  139  K 3.6  --  4.2  CL 105  --  108  CO2 23  --  23  GLUCOSE 166*  --  100*  BUN 23*  --  28*  CREATININE 0.77  --  0.80  CALCIUM 8.9  --  8.6*  MG  --  2.1  --    Liver Function Tests:  Recent Labs Lab 01/29/17 1251  AST 18  ALT 9*  ALKPHOS 73  BILITOT 0.7  PROT 7.3  ALBUMIN 3.4*   No results for input(s): LIPASE, AMYLASE in the last 168 hours. No results for input(s): AMMONIA in the last 168 hours. CBC:  Recent Labs Lab 01/29/17 1251 01/30/17 0427  WBC 5.5 4.4  NEUTROABS 3.7  --   HGB 10.5* 9.8*  HCT 32.1* 30.3*  MCV 87.2 87.6  PLT 287 263   Cardiac Enzymes:  Recent Labs Lab 01/29/17 1251 01/29/17 2022 01/30/17 0427 01/30/17 0834  TROPONINI <0.03 <0.03 0.06* 0.05*   BNP: BNP (last 3 results)  Recent Labs  01/29/17 1305  BNP 372.0*    ProBNP (last 3 results) No results for input(s): PROBNP in the last 8760 hours.  CBG:  Recent Labs Lab 01/29/17 1248  GLUCAP 179*       Signed:  Olie Scaffidi MD.  [facility] 01/30/2017, 5:27 PM

## 2017-01-30 NOTE — Care Management Obs Status (Signed)
MEDICARE OBSERVATION STATUS NOTIFICATION   Patient Details  Name: Dana [name] MRN: [id number] Date of Birth: [birth year]   Medicare Observation Status Notification Given:  Yes    Malcolm Metro, RN 01/30/2017, 11:16 AM

## 2017-01-30 NOTE — Care Management Note (Signed)
Case Management Note  Patient Details  Name: Dana [name] MRN: [id number] Date of Birth: 1940-10-29  Subjective/Objective:                  Admitted for r/o chest pain. Pt from home, lives with family. She is ind with ADL's. She has PCP and transportation to appointments. She has insurance with drug coverage. Daughter reports no difficulty managing medication. She plans to return home with self care. No CM needs communicated.   Action/Plan: DC home today.   Expected Discharge Date:     01/30/2017             Expected Discharge Plan:  Home/Self Care  In-House Referral:  NA  Discharge planning Services  CM Consult  Post Acute Care Choice:  NA Choice offered to:  NA  Status of Service:  Completed, signed off  Malcolm Metro, RN 01/30/2017, 11:58 AM

## 2017-01-30 NOTE — Progress Notes (Signed)
*  PRELIMINARY RESULTS* Echocardiogram 2D Echocardiogram has been performed.  Dana [name] 01/30/2017, 4:32 PM

## 2017-02-14 ENCOUNTER — Encounter (INDEPENDENT_AMBULATORY_CARE_PROVIDER_SITE_OTHER): Payer: Medicare Other | Admitting: Ophthalmology

## 2017-02-14 DIAGNOSIS — H33302 Unspecified retinal break, left eye: Secondary | ICD-10-CM

## 2017-02-14 NOTE — Progress Notes (Signed)
Cardiology Office Note    Date:  02/15/2017   ID:  Dana [name], Dana [name] [birth year], MRN [id number]  PCP:  [name], MD  Cardiologist: Dr. Diona Browner  Chief Complaint  Patient presents with  . Follow-up    History of Present Illness:  Dana [name] is a 76 y.o. female does not speak [place] From [place] with a hx of HOCM  CRF; HTN and elevated lipids. History of cardiac catheterization in [place] 08/07/14 with 40% nonobstructive LAD no obstructive disease. Echo 09/27/16 with severe LVH and LVOT velocity under 2 m/second with some ambiguity due to MR signal.   Seen in the hospital 01/30/17 with chest pain. EKG with chronic LVH with strain. Troponins flat at 0.03 0.06 and 0.05 Felt substernal chest pain may be from microvascular angina and low-dose verapamil was added. Repeat 2-D echo 02/01/17 LVEF 65-70% a chest CT(negative for PE, mild volume overload/CHF), and LVOT gradient hard to differentiate from MR signal likely 2-3 m/s. Myoview 01/30/17 was low risk LVEF 45-54% with no ST segment deviation during stress. No ischemia. Significant bowel artifact obscures some of the inferior and lateral wall. Patient was anemic hemoglobin 9.8.  Patient comes in today accompanied by her son who interprets for her. He says her heart is been doing well since she's been home. She denies any further chest pain or dyspnea. She was having more knee pain and was placed on a steroid taper by her family physician.    Past Medical History:  Diagnosis Date  . Arthritis   . Essential hypertension   . Gout   . Heart abnormality   . HTN (hypertension) 01/29/2017  . Hyperlipidemia     History reviewed. No pertinent surgical history.  Current Medications: Current Meds  Medication Sig  . acetaminophen (TYLENOL 8 HOUR ARTHRITIS PAIN) 650 MG CR tablet Take 650 mg by mouth every 8 (eight) hours as needed for pain.  [name] allopurinol (ZYLOPRIM) 300 MG tablet Take 300 mg by mouth daily.  [name] aspirin EC 81 MG tablet Take 81 mg by  mouth daily.  [name] atorvastatin (LIPITOR) 10 MG tablet Take 10 mg by mouth daily.  . folic acid (FOLVITE) 1 MG tablet Take 1 mg by mouth daily.  . furosemide (LASIX) 40 MG tablet Take 40 mg by mouth daily as needed for fluid.   . metoprolol (LOPRESSOR) 100 MG tablet Take 100 mg by mouth 2 (two) times daily.  . nitroGLYCERIN (NITROSTAT) 0.4 MG SL tablet Place 0.4 mg under the tongue every 5 (five) minutes as needed for chest pain.  . pantoprazole (PROTONIX) 40 MG tablet Take 1 tablet (40 mg total) by mouth daily at 6 (six) AM.  . prednisoLONE acetate (PRED FORTE) 1 % ophthalmic suspension PLACE ONE DROP INTO THE RIGHT EYE TWICE DAILY  . RESTASIS MULTIDOSE 0.05 % ophthalmic emulsion PLACE ONE DROP IN BOTH EYES TWICE DAILY  . verapamil (CALAN-SR) 120 MG CR tablet Take 1 tablet (120 mg total) by mouth daily.     Allergies:   Patient has no known allergies.   Social History   Social History  . Marital status: Widowed    Spouse name: N/A  . Number of children: N/A  . Years of education: N/A   Social History Main Topics  . Smoking status: Never Smoker  . Smokeless tobacco: Never Used  . Alcohol use No  . Drug use: No  . Sexual activity: Not Asked   Other Topics Concern  . None   Social History Narrative  .  None     Family History:  The patient's family history includes Alcohol abuse in her father; Hypertension in her mother; Liver disease in her mother.   ROS:   Please see the history of present illness.    Review of Systems  Constitution: Negative.  HENT: Negative.   Eyes: Negative.   Cardiovascular: Negative.   Respiratory: Negative.   Hematologic/Lymphatic: Negative.   Musculoskeletal: Positive for arthritis, joint pain and joint swelling.  Gastrointestinal: Negative.   Genitourinary: Negative.   Neurological: Negative.    All other systems reviewed and are negative.   PHYSICAL EXAM:   VS:  BP 122/78 (BP Location: Left Arm)   Pulse (!) 58   Ht 5\' 2"  (1.575 m)   Wt  172 lb 12.8 oz (78.4 kg)   SpO2 98%   BMI 31.61 kg/m   Physical Exam  GEN: Overweight, in no acute distress  Neck: no JVD, carotid bruits, or masses Cardiac:RRR; 2-3/6 are systolic murmur throughout Respiratory:  clear to auscultation bilaterally, normal work of breathing GI: soft, nontender, nondistended, + BS Ext: without cyanosis, clubbing, or edema, Good distal pulses bilaterally Neuro:  Alert and Oriented x 3 Psych: euthymic mood, full affect  Wt Readings from Last 3 Encounters:  02/15/17 172 lb 12.8 oz (78.4 kg)  01/30/17 171 lb 14.4 oz (78 kg)  09/21/16 173 lb 9.6 oz (78.7 kg)      Studies/Labs Reviewed:   EKG:  EKG is  ordered today.  The ekg ordered today demonstrates sinus bradycardia 49 bpm with LVH and ST-T wave changes unchanged from prior tracing  Recent Labs: 01/29/2017: ALT 9; B Natriuretic Peptide 372.0; Magnesium 2.1; TSH 1.045 01/30/2017: BUN 28; Creatinine, Ser 0.80; Hemoglobin 9.8; Platelets 263; Potassium 4.2; Sodium 139   Lipid Panel    Component Value Date/Time   CHOL 112 01/30/2017 0427   TRIG 74 01/30/2017 0427   HDL 41 01/30/2017 0427   CHOLHDL 2.7 01/30/2017 0427   VLDL 15 01/30/2017 0427   LDLCALC 56 01/30/2017 0427    Additional studies/ records that were reviewed today include:  2-D echo 01/30/17 Study Conclusions   - Left ventricle: Findings consistant with HOCM> SAM LVOT gradient   hard to differentiate from MR signal but likely velocity is   between 2-74m/sec. Severe basal septal hypertrophy. Systolic   function was vigorous. The estimated ejection fraction was in the   range of 65% to 70%. - Left atrium: The atrium was mildly dilated. - Atrial septum: No defect or patent foramen ovale was identified. - Pulmonary arteries: PA peak pressure: 39 mm Hg (S).    Myoview 01/30/17  This is a low risk study.  The left ventricular ejection fraction is mildly decreased (45-54%).  There was no ST segment deviation noted during stress.     Significant bowel artifact obscures some of the inferior and lateral wall No ischemia EF 54% low risk study    ASSESSMENT:    1. Chest pain, unspecified type   2. Hypertrophic cardiomyopathy (HCC)   3. Essential hypertension   4. Hyperlipidemia, unspecified hyperlipidemia type      PLAN:  In order of problems listed above:   Chest pain Resolved patient was hospitalization with similar symptoms low risk Myoview. Felt she could possibly have microvascular angina and low-dose verapamil was added. Sinus bradycardia on both verapamil and beta blocker that patient feeling much better. Will watch closely. Follow-up with Dr. 04/01/17 in October.  HOCM follow-up echo LVOT gradient hard to differentiate  from MR signal but likely velocity is between 2-3 m/s. Severe basal septal hypertrophy, systolic function was vigorous LVEF 65-70%. Avoid dehydration and continue beta blocker.  Essential hypertension blood pressure controlled  Hyperlipidemia continue Lipitor   Medication Adjustments/Labs and Tests Ordered: Current medicines are reviewed at length with the patient today.  Concerns regarding medicines are outlined above.  Medication changes, Labs and Tests ordered today are listed in the Patient Instructions below. Patient Instructions  Medication Instructions:  Your physician recommends that you continue on your current medications as directed. Please refer to the Current Medication list given to you today.   Labwork: NONE  Testing/Procedures: NONE  Follow-Up: Your physician recommends that you schedule a follow-up appointment - 04/07/2017    Any Other Special Instructions Will Be Listed Below (If Applicable).     If you need a refill on your cardiac medications before your next appointment, please call your pharmacy.      Elson Clan, PA-C  02/15/2017 11:03 AM    [facility] [address] [place], [place], [place]  [address] Phone: [phone]; Fax: [phone]

## 2017-02-15 ENCOUNTER — Ambulatory Visit (INDEPENDENT_AMBULATORY_CARE_PROVIDER_SITE_OTHER): Payer: Medicare Other | Admitting: Physician Assistant

## 2017-02-15 ENCOUNTER — Encounter: Payer: Self-pay | Admitting: Physician Assistant

## 2017-02-15 VITALS — BP 122/78 | HR 58 | Ht 62.0 in | Wt 172.8 lb

## 2017-02-15 DIAGNOSIS — I422 Other hypertrophic cardiomyopathy: Secondary | ICD-10-CM | POA: Diagnosis not present

## 2017-02-15 DIAGNOSIS — E785 Hyperlipidemia, unspecified: Secondary | ICD-10-CM

## 2017-02-15 DIAGNOSIS — R079 Chest pain, unspecified: Secondary | ICD-10-CM

## 2017-02-15 DIAGNOSIS — I10 Essential (primary) hypertension: Secondary | ICD-10-CM

## 2017-02-15 NOTE — Patient Instructions (Signed)
Medication Instructions:  Your physician recommends that you continue on your current medications as directed. Please refer to the Current Medication list given to you today.   Labwork: NONE  Testing/Procedures: NONE  Follow-Up: Your physician recommends that you schedule a follow-up appointment - 04/07/2017    Any Other Special Instructions Will Be Listed Below (If Applicable).     If you need a refill on your cardiac medications before your next appointment, please call your pharmacy.

## 2017-02-28 ENCOUNTER — Encounter (HOSPITAL_COMMUNITY): Payer: Self-pay | Admitting: *Deleted

## 2017-02-28 ENCOUNTER — Emergency Department (HOSPITAL_COMMUNITY): Payer: Medicare Other

## 2017-02-28 ENCOUNTER — Inpatient Hospital Stay (HOSPITAL_COMMUNITY)
Admission: EM | Admit: 2017-02-28 | Discharge: 2017-03-04 | DRG: 247 | Disposition: A | Discharge status: Home / Self Care | Payer: Medicare Other | Attending: Cardiology | Admitting: Cardiology

## 2017-02-28 DIAGNOSIS — I422 Other hypertrophic cardiomyopathy: Secondary | ICD-10-CM | POA: Diagnosis not present

## 2017-02-28 DIAGNOSIS — E785 Hyperlipidemia, unspecified: Secondary | ICD-10-CM | POA: Diagnosis present

## 2017-02-28 DIAGNOSIS — R079 Chest pain, unspecified: Secondary | ICD-10-CM | POA: Diagnosis present

## 2017-02-28 DIAGNOSIS — I25110 Atherosclerotic heart disease of native coronary artery with unstable angina pectoris: Secondary | ICD-10-CM | POA: Diagnosis not present

## 2017-02-28 DIAGNOSIS — I421 Obstructive hypertrophic cardiomyopathy: Secondary | ICD-10-CM | POA: Diagnosis present

## 2017-02-28 DIAGNOSIS — I10 Essential (primary) hypertension: Secondary | ICD-10-CM | POA: Diagnosis present

## 2017-02-28 DIAGNOSIS — D649 Anemia, unspecified: Secondary | ICD-10-CM | POA: Diagnosis present

## 2017-02-28 DIAGNOSIS — Z955 Presence of coronary angioplasty implant and graft: Secondary | ICD-10-CM

## 2017-02-28 DIAGNOSIS — M1990 Unspecified osteoarthritis, unspecified site: Secondary | ICD-10-CM | POA: Diagnosis present

## 2017-02-28 DIAGNOSIS — R0789 Other chest pain: Secondary | ICD-10-CM | POA: Diagnosis present

## 2017-02-28 DIAGNOSIS — R072 Precordial pain: Secondary | ICD-10-CM

## 2017-02-28 DIAGNOSIS — I200 Unstable angina: Secondary | ICD-10-CM

## 2017-02-28 DIAGNOSIS — M109 Gout, unspecified: Secondary | ICD-10-CM | POA: Diagnosis present

## 2017-02-28 LAB — BASIC METABOLIC PANEL
ANION GAP: 10 (ref 5–15)
BUN: 31 mg/dL — ABNORMAL HIGH (ref 6–20)
CHLORIDE: 100 mmol/L — AB (ref 101–111)
CO2: 25 mmol/L (ref 22–32)
Calcium: 8.5 mg/dL — ABNORMAL LOW (ref 8.9–10.3)
Creatinine, Ser: 0.89 mg/dL (ref 0.44–1.00)
GFR calc non Af Amer: >60 mL/min (ref >60)
GLUCOSE: 114 mg/dL — AB (ref 65–99)
POTASSIUM: 3.5 mmol/L (ref 3.5–5.1)
Sodium: 135 mmol/L (ref 135–145)

## 2017-02-28 LAB — CBC
HEMATOCRIT: 39.3 % (ref 36.0–46.0)
HEMOGLOBIN: 12.9 g/dL (ref 12.0–15.0)
MCH: 28 pg (ref 26.0–34.0)
MCHC: 32.8 g/dL (ref 30.0–36.0)
MCV: 85.4 fL (ref 78.0–100.0)
Platelets: 221 10*3/uL (ref 150–400)
RBC: 4.6 MIL/uL (ref 3.87–5.11)
RDW: 16.3 % — ABNORMAL HIGH (ref 11.5–15.5)
WBC: 11.5 10*3/uL — ABNORMAL HIGH (ref 4.0–10.5)

## 2017-02-28 LAB — TROPONIN I: Troponin I: 0.03 ng/mL (ref <0.03)

## 2017-02-28 LAB — I-STAT TROPONIN, ED: Troponin i, poc: 0.05 ng/mL (ref 0.00–0.08)

## 2017-02-28 MED ORDER — NITROGLYCERIN 2 % TD OINT
TOPICAL_OINTMENT | TRANSDERMAL | Status: AC
  Filled 2017-02-28: qty 1

## 2017-02-28 MED ORDER — PREDNISONE 5 MG PO TABS
10.0000 mg | ORAL_TABLET | Freq: Every day | ORAL | Status: DC
  Administered 2017-03-01 – 2017-03-04 (×4): 10 mg via ORAL
  Filled 2017-02-28: qty 1
  Filled 2017-02-28: qty 2
  Filled 2017-02-28 (×2): qty 1

## 2017-02-28 MED ORDER — ENOXAPARIN SODIUM 40 MG/0.4ML ~~LOC~~ SOLN: 40.0000 mg | SUBCUTANEOUS | Status: DC

## 2017-02-28 MED ORDER — PREDNISOLONE ACETATE 1 % OP SUSP
1.0000 [drp] | Freq: Two times a day (BID) | OPHTHALMIC | Status: DC
  Administered 2017-03-01 – 2017-03-04 (×5): 1 [drp] via OPHTHALMIC
  Filled 2017-02-28: qty 5
  Filled 2017-02-28 (×2): qty 1

## 2017-02-28 MED ORDER — ALLOPURINOL 300 MG PO TABS
300.0000 mg | ORAL_TABLET | Freq: Every day | ORAL | Status: DC
  Administered 2017-03-01 – 2017-03-04 (×4): 300 mg via ORAL
  Filled 2017-02-28 (×5): qty 1

## 2017-02-28 MED ORDER — HEPARIN BOLUS VIA INFUSION
4000.0000 [IU] | Freq: Once | INTRAVENOUS | Status: AC
  Administered 2017-03-01: 4000 [IU] via INTRAVENOUS
  Filled 2017-02-28: qty 4000

## 2017-02-28 MED ORDER — ONDANSETRON HCL 4 MG/2ML IJ SOLN: 4.0000 mg | Freq: Four times a day (QID) | INTRAMUSCULAR | Status: DC | PRN

## 2017-02-28 MED ORDER — ACETAMINOPHEN 325 MG PO TABS: 650.0000 mg | ORAL_TABLET | ORAL | Status: DC | PRN

## 2017-02-28 MED ORDER — NITROGLYCERIN 2 % TD OINT
1.0000 [in_us] | TOPICAL_OINTMENT | Freq: Once | TRANSDERMAL | Status: AC
  Administered 2017-02-28: 1 [in_us] via TOPICAL

## 2017-02-28 MED ORDER — SODIUM CHLORIDE 0.9 % IV BOLUS (SEPSIS)
500.0000 mL | Freq: Once | INTRAVENOUS | Status: AC
  Administered 2017-02-28: 500 mL via INTRAVENOUS

## 2017-02-28 MED ORDER — PANTOPRAZOLE SODIUM 40 MG PO TBEC
40.0000 mg | DELAYED_RELEASE_TABLET | Freq: Every day | ORAL | Status: DC
  Administered 2017-03-01 – 2017-03-04 (×4): 40 mg via ORAL
  Filled 2017-02-28 (×4): qty 1

## 2017-02-28 MED ORDER — FOLIC ACID 1 MG PO TABS
1.0000 mg | ORAL_TABLET | Freq: Every day | ORAL | Status: DC
  Administered 2017-03-01 – 2017-03-04 (×5): 1 mg via ORAL
  Filled 2017-02-28 (×5): qty 1

## 2017-02-28 MED ORDER — NITROGLYCERIN 0.4 MG SL SUBL: 0.4000 mg | SUBLINGUAL_TABLET | Freq: Once | SUBLINGUAL | Status: DC

## 2017-02-28 MED ORDER — METOPROLOL TARTRATE 25 MG PO TABS
100.0000 mg | ORAL_TABLET | Freq: Two times a day (BID) | ORAL | Status: DC
  Administered 2017-03-02 – 2017-03-04 (×3): 100 mg via ORAL
  Filled 2017-02-28: qty 4
  Filled 2017-02-28: qty 1
  Filled 2017-02-28: qty 4
  Filled 2017-02-28 (×2): qty 1
  Filled 2017-02-28: qty 2

## 2017-02-28 MED ORDER — HEPARIN (PORCINE) IN NACL 100-0.45 UNIT/ML-% IJ SOLN
650.0000 [IU]/h | INTRAMUSCULAR | Status: DC
  Administered 2017-03-01: 800 [IU]/h via INTRAVENOUS
  Administered 2017-03-02: 650 [IU]/h via INTRAVENOUS
  Filled 2017-02-28 (×2): qty 250

## 2017-02-28 MED ORDER — VERAPAMIL HCL ER 120 MG PO TBCR
120.0000 mg | EXTENDED_RELEASE_TABLET | Freq: Every day | ORAL | Status: DC
  Administered 2017-03-03 – 2017-03-04 (×2): 120 mg via ORAL
  Filled 2017-02-28 (×6): qty 1

## 2017-02-28 MED ORDER — CYCLOSPORINE 0.05 % OP EMUL
1.0000 [drp] | Freq: Two times a day (BID) | OPHTHALMIC | Status: DC
  Administered 2017-03-01 – 2017-03-04 (×8): 1 [drp] via OPHTHALMIC
  Filled 2017-02-28 (×8): qty 1

## 2017-02-28 MED ORDER — ATORVASTATIN CALCIUM 10 MG PO TABS
10.0000 mg | ORAL_TABLET | Freq: Every day | ORAL | Status: DC
  Administered 2017-03-01 – 2017-03-03 (×4): 10 mg via ORAL
  Filled 2017-02-28 (×4): qty 1

## 2017-02-28 MED ORDER — ASPIRIN EC 81 MG PO TBEC
81.0000 mg | DELAYED_RELEASE_TABLET | Freq: Every day | ORAL | Status: DC
  Administered 2017-03-01 – 2017-03-04 (×5): 81 mg via ORAL
  Filled 2017-02-28 (×5): qty 1

## 2017-02-28 NOTE — ED Triage Notes (Addendum)
Pt brought in by [facility] for c/o chest pain and weakness x 1 day; pt states the pain is to the left side of her chest and does not radiate; pt describes the pain as a "needle stick pain"; son administered 4 baby aspirin at home prior to ems arrival

## 2017-02-28 NOTE — H&P (Signed)
History and Physical    Dana [name] LMR:[id number] DOB: [birth year] DOA: 02/28/2017  PCP: [name], MD  Patient coming from: Home.  Chief Complaint: Chest pain.  HPI: Dana [name] is a 76 y.o. female with history of HOCM, hypertension, gout presents to the ER with complaints of chest pain. Patient was recently admitted to the hospital and had stress test Myoview which was negative for ischemia and patient was placed on verapamil. Patient also was recently placed on prednisone likely for gouty arthritis and has 7 more days left. Patient states chest pain started 2 days ago left anterior chest wall pressure-like present even at rest increases on exertion with no associated shortness of breath but had some mild dizziness. Denies any productive cough fever or chills.   ED Course: EKG in the ER does not show anything acute. Troponin was negative. Chest x-ray was unremarkable. Patient was given sublingual nitroglycerin for which patient blood pressure dropped following which patient was given 500 mL normal saline bolus.  Review of Systems: As per HPI, rest all negative.   Past Medical History:  Diagnosis Date  . Arthritis   . Essential hypertension   . Gout   . Heart abnormality   . HTN (hypertension) 01/29/2017  . Hyperlipidemia     History reviewed. No pertinent surgical history.   reports that she has never smoked. She has never used smokeless tobacco. She reports that she does not drink alcohol or use drugs.  No Known Allergies  Family History  Problem Relation Age of Onset  . Hypertension Mother   . Liver disease Mother   . Alcohol abuse Father     Prior to Admission medications   Medication Sig Start Date End Date Taking? Authorizing Provider  acetaminophen (TYLENOL 8 HOUR ARTHRITIS PAIN) 650 MG CR tablet Take 650 mg by mouth every 8 (eight) hours as needed for pain.   Yes [provider]  allopurinol (ZYLOPRIM) 300 MG tablet Take 300 mg by mouth daily.   Yes  [provider]  aspirin EC 81 MG tablet Take 81 mg by mouth daily.   Yes [provider]  atorvastatin (LIPITOR) 10 MG tablet Take 10 mg by mouth daily.   Yes [provider]  folic acid (FOLVITE) 1 MG tablet Take 1 mg by mouth daily.   Yes [provider]  furosemide (LASIX) 40 MG tablet Take 40 mg by mouth daily as needed for fluid.    Yes [provider]  metoprolol (LOPRESSOR) 100 MG tablet Take 100 mg by mouth 2 (two) times daily.   Yes [provider]  nitroGLYCERIN (NITROSTAT) 0.4 MG SL tablet Place 0.4 mg under the tongue every 5 (five) minutes as needed for chest pain.   Yes [provider]  pantoprazole (PROTONIX) 40 MG tablet Take 1 tablet (40 mg total) by mouth daily at 6 (six) AM. 01/31/17  Yes Rodolph Bong, MD  prednisoLONE acetate (PRED FORTE) 1 % ophthalmic suspension PLACE ONE DROP INTO THE RIGHT EYE TWICE DAILY 01/20/17  Yes [provider]  predniSONE (DELTASONE) 10 MG tablet TAKE ONE TABLET BY MOUTH ONCE DAILY FOR 7 DAYS STARTING ON 02/22/2017 02/08/17  Yes [provider]  RESTASIS MULTIDOSE 0.05 % ophthalmic emulsion PLACE ONE DROP IN BOTH EYES TWICE DAILY 12/26/16  Yes [provider]  verapamil (CALAN-SR) 120 MG CR tablet Take 1 tablet (120 mg total) by mouth daily. 01/30/17  Yes Rodolph Bong, MD    Physical Exam: Vitals:  02/28/17 1930 02/28/17 2000 02/28/17 2030 02/28/17 2133  BP: 95/69 102/78 105/75 100/86  Pulse: 100 85 89 98  Resp: (!) 24 12 17 18   Temp:      TempSrc:      SpO2: 94% 98% 97% 97%  Weight:      Height:          Constitutional: Moderately built and nourished. Vitals:   02/28/17 1930 02/28/17 2000 02/28/17 2030 02/28/17 2133  BP: 95/69 102/78 105/75 100/86  Pulse: 100 85 89 98  Resp: (!) 24 12 17 18   Temp:      TempSrc:      SpO2: 94% 98% 97% 97%  Weight:      Height:       Eyes: Anicteric no pallor. ENMT: No discharge from the ears eyes  nose or mouth. Neck: No mass felt. No JVD appreciated. Respiratory: No rhonchi or crepitations. Cardiovascular: S1-S2 heard systolic murmur heard. Abdomen: Soft nontender bowel sounds present. Musculoskeletal: No edema. No joint effusion. Skin: No rash. Skin appears warm. Neurologic: Alert awake oriented to time place and person. Moves all extremities. Psychiatric: Appears normal. Normal affect.   Labs on Admission: I have personally reviewed following labs and imaging studies  CBC:  Recent Labs Lab 02/28/17 1916  WBC 11.5*  HGB 12.9  HCT 39.3  MCV 85.4  PLT 221   Basic Metabolic Panel:  Recent Labs Lab 02/28/17 1916  NA 135  K 3.5  CL 100*  CO2 25  GLUCOSE 114*  BUN 31*  CREATININE 0.89  CALCIUM 8.5*   GFR: Estimated Creatinine Clearance: 52.9 mL/min (by C-G formula based on SCr of 0.89 mg/dL). Liver Function Tests: No results for input(s): AST, ALT, ALKPHOS, BILITOT, PROT, ALBUMIN in the last 168 hours. No results for input(s): LIPASE, AMYLASE in the last 168 hours. No results for input(s): AMMONIA in the last 168 hours. Coagulation Profile: No results for input(s): INR, PROTIME in the last 168 hours. Cardiac Enzymes: No results for input(s): CKTOTAL, CKMB, CKMBINDEX, TROPONINI in the last 168 hours. BNP (last 3 results) No results for input(s): PROBNP in the last 8760 hours. HbA1C: No results for input(s): HGBA1C in the last 72 hours. CBG: No results for input(s): GLUCAP in the last 168 hours. Lipid Profile: No results for input(s): CHOL, HDL, LDLCALC, TRIG, CHOLHDL, LDLDIRECT in the last 72 hours. Thyroid Function Tests: No results for input(s): TSH, T4TOTAL, FREET4, T3FREE, THYROIDAB in the last 72 hours. Anemia Panel: No results for input(s): VITAMINB12, FOLATE, FERRITIN, TIBC, IRON, RETICCTPCT in the last 72 hours. Urine analysis: No results found for: COLORURINE, APPEARANCEUR, LABSPEC, PHURINE, GLUCOSEU, HGBUR, BILIRUBINUR, KETONESUR, PROTEINUR,  UROBILINOGEN, NITRITE, LEUKOCYTESUR Sepsis Labs: @LABRCNTIP (procalcitonin:4,lacticidven:4) )No results found for this or any previous visit (from the past 240 hour(s)).   Radiological Exams on Admission: Dg Chest 2 View  Result Date: 02/28/2017 CLINICAL DATA:  Chest pain and weakness for 1 day. EXAM: CHEST  2 VIEW COMPARISON:  Chest radiographs and CTA 01/29/2017 FINDINGS: The cardiomediastinal silhouette is unchanged. Heart size is within normal limits. The descending thoracic aorta is mildly tortuous. The lungs are clear aside from minimal atelectasis or scarring in the left lung base. No pleural effusion or pneumothorax is identified. No acute osseous abnormality is seen. IMPRESSION: No active cardiopulmonary disease. Electronically Signed   By: M.D.   On: 02/28/2017 19:39    EKG: Independently reviewed. Normal sinus rhythm with LVH.  Assessment/Plan Principal Problem:   Chest pain Active Problems:  Hypertrophic cardiomyopathy (HCC)   HTN (hypertension)   Gout   Anemia    1. Chest pain - patient has had a negative stress test recently. Will cycle cardiac markers keep patient nothing by mouth in a.m. Consult cardiology in the a.m.. Since patient's blood pressure is in the low normal will avoid nitroglycerin. Patient is on Aspirin and Lipitor. 2. HOCM - will try to avoid dehydration. Beta blockers and verapamil has been ordered for tomorrow morning. 3. Gout - with recent exacerbation patient on prednisone. 4. Hypertension presently blood pressure is in the low-normal closely observe.  I have reviewed patient's old charts and labs.   DVT prophylaxis: Lovenox. Code Status: Full code.  Family Communication: Discussed with patient.  Disposition Plan: Home.  Consults called: None.  Admission status: Observation.    Eduard Clos MD [facility] Pager [phone].  If 7PM-7AM, please contact night-coverage [email or web address] Password [email or web address]  02/28/2017,  9:51 PM

## 2017-02-28 NOTE — ED Provider Notes (Signed)
Emergency Department Provider Note   I have reviewed the triage vital signs and the nursing notes.   HISTORY  Chief Complaint Chest Pain   HPI Dana [name] is a 76 y.o. female with PMH of HTN, HLD, MR, and hypertrophic cardiomyopathy presents to the emergency department for evaluation of intermittent substernal chest pain over the past 24 hours. The patient states the pain is sharp and "needle-like" but also feels like a heavy pressure. No modifying factors. No fever or shaking chills. She does "feel cold" at times. This pain feels similar to pain experienced during her last admission. Denies any abdominal pain, nausea, or vomiting. No radiation of pain. Son at bedside gave ASA PTA.    Past Medical History:  Diagnosis Date  . Arthritis   . Essential hypertension   . Gout   . Heart abnormality   . HTN (hypertension) 01/29/2017  . Hyperlipidemia     Patient Active Problem List   Diagnosis Date Noted  . Chest pain 01/29/2017  . Hypertrophic cardiomyopathy (HCC) 01/29/2017  . MR (mitral regurgitation) 01/29/2017  . HTN (hypertension) 01/29/2017  . Hyperlipidemia 01/29/2017  . Gout 01/29/2017  . Arthritis 01/29/2017  . Anemia 01/29/2017    History reviewed. No pertinent surgical history.  Current Outpatient Rx  . Order #: [id number] Class: Historical Med  . Order #: [id number] Class: Historical Med  . Order #: [id number] Class: Historical Med  . Order #: [id number] Class: Historical Med  . Order #: [id number] Class: Historical Med  . Order #: [id number] Class: Historical Med  . Order #: [id number] Class: Historical Med  . Order #: [id number] Class: Historical Med  . Order #: [id number] Class: Print  . Order #: [id number] Class: Historical Med  . Order #: [id number] Class: Historical Med  . Order #: [id number] Class: Historical Med  . Order #: [id number] Class: Print    Allergies Patient has no known allergies.  Family History  Problem Relation Age of Onset  . Hypertension Mother   .  Liver disease Mother   . Alcohol abuse Father     Social History Social History  Substance Use Topics  . Smoking status: Never Smoker  . Smokeless tobacco: Never Used  . Alcohol use No    Review of Systems  Constitutional: No fever/chills Eyes: No visual changes. ENT: No sore throat. Cardiovascular: Positive chest pain. Respiratory: Denies shortness of breath. Gastrointestinal: No abdominal pain.  No nausea, no vomiting.  No diarrhea.  No constipation. Genitourinary: Negative for dysuria. Musculoskeletal: Negative for back pain. Skin: Negative for rash. Neurological: Negative for headaches, focal weakness or numbness.  10-point ROS otherwise negative.  ____________________________________________   PHYSICAL EXAM:  VITAL SIGNS: ED Triage Vitals  Enc Vitals Group     BP 02/28/17 1914 97/72     Pulse Rate 02/28/17 1914 99     Resp 02/28/17 1914 18     Temp 02/28/17 1914 98.4 F (36.9 C)     Temp Source 02/28/17 1914 Oral     SpO2 02/28/17 1914 96 %     Weight 02/28/17 1911 172 lb (78 kg)     Height 02/28/17 1911 5\' 2"  (1.575 m)     Pain Score 02/28/17 1910 6   Constitutional: Alert and oriented. Well appearing and in no acute distress. Eyes: Conjunctivae are normal. Head: Atraumatic. Nose: No congestion/rhinnorhea. Mouth/Throat: Mucous membranes are moist.  Oropharynx non-erythematous. Neck: No stridor.  Cardiovascular: Normal rate, regular rhythm. Good peripheral circulation. 4/6 systolic murmur.  Respiratory: Normal respiratory effort.  No retractions. Lungs CTAB. Gastrointestinal: Soft and nontender.  No distention.  Musculoskeletal: No lower extremity tenderness nor edema. No gross deformities of extremities. Neurologic:  Normal speech and language. No gross focal neurologic deficits are appreciated.  Skin:  Skin is warm, dry and intact. No rash noted.  ____________________________________________   LABS (all labs ordered are listed, but only abnormal  results are displayed)  Labs Reviewed  BASIC METABOLIC PANEL - Abnormal; Notable for the following:       Result Value   Chloride 100 (*)    Glucose, Bld 114 (*)    BUN 31 (*)    Calcium 8.5 (*)    All other components within normal limits  CBC - Abnormal; Notable for the following:    WBC 11.5 (*)    RDW 16.3 (*)    All other components within normal limits  I-STAT TROPONIN, ED   ____________________________________________  EKG   EKG Interpretation  Date/Time:  Tuesday February 28 2017 19:13:35 EDT Ventricular Rate:  96 PR Interval:    QRS Duration: 101 QT Interval:  362 QTC Calculation: 458 R Axis:   24 Text Interpretation:  Sinus rhythm Abnormal R-wave progression, early transition LVH with secondary repolarization abnormality No STEMI. Similar to prior.  Confirmed by Alona Bene [phone]) on 02/28/2017 7:18:05 PM       ____________________________________________  RADIOLOGY  Dg Chest 2 View  Result Date: 02/28/2017 CLINICAL DATA:  Chest pain and weakness for 1 day. EXAM: CHEST  2 VIEW COMPARISON:  Chest radiographs and CTA 01/29/2017 FINDINGS: The cardiomediastinal silhouette is unchanged. Heart size is within normal limits. The descending thoracic aorta is mildly tortuous. The lungs are clear aside from minimal atelectasis or scarring in the left lung base. No pleural effusion or pneumothorax is identified. No acute osseous abnormality is seen. IMPRESSION: No active cardiopulmonary disease. Electronically Signed   By: Sebastian Ache M.D.   On: 02/28/2017 19:39    ____________________________________________   PROCEDURES  Procedure(s) performed:   Procedures  None ____________________________________________   INITIAL IMPRESSION / ASSESSMENT AND PLAN / ED COURSE  Pertinent labs & imaging results that were available during my care of the patient were reviewed by me and considered in my medical decision making (see chart for details).  Patient presents to  the emergency department for evaluation of chest pain that has occurred intermittently over the past 24 hours. They're both typical and atypical features. Patient has significant risk factors. Last heart catheterization was done in [place] in 2016 with 40% LAD lesion noted at that time. Patient had recent CP admission last month with similar symptoms. ECHO and enzyme trending at that time. Patient also with HOCH. BP borderline low especially in comparison to discharge vital signs last month. Continued beta blocker during last admission and added Verapamil. Will treat with IVF and Nitro ointment for pain.   Discussed patient's case with Hospitalist, Dr. Toniann Fail to request admission. Patient and family (if present) updated with plan. Care transferred to Hospitalist service.  I reviewed all nursing notes, vitals, pertinent old records, EKGs, labs, imaging (as available).  ____________________________________________  FINAL CLINICAL IMPRESSION(S) / ED DIAGNOSES  Final diagnoses:  Precordial chest pain     MEDICATIONS GIVEN DURING THIS VISIT:  Medications  nitroGLYCERIN (NITROGLYN) 2 % ointment 1 inch (1 inch Topical Given 02/28/17 1948)  sodium chloride 0.9 % bolus 500 mL (0 mLs Intravenous Stopped 02/28/17 2019)     NEW OUTPATIENT MEDICATIONS STARTED DURING THIS VISIT:  None   Note:  This document was prepared using Dragon voice recognition  software and may include unintentional dictation errors.  Alona Bene, MD Emergency Medicine    Long, Arlyss Repress, MD 02/28/17 2053

## 2017-02-28 NOTE — Progress Notes (Signed)
ANTICOAGULATION CONSULT NOTE - Preliminary  Pharmacy Consult for Heparin Indication: ACS/STEMI  No Known Allergies  Patient Measurements: Height: 5\' 2"  (157.5 cm) Weight: 167 lb (75.8 kg) IBW/kg (Calculated) : 50.1 HEPARIN DW (KG): 66.6   Vital Signs: Temp: 98.4 F (36.9 C) (09/04 2230) Temp Source: Oral (09/04 2230) BP: 95/74 (09/04 2230) Pulse Rate: 92 (09/04 2230)  Labs:  Recent Labs  02/28/17 1916 02/28/17 2156  HGB 12.9  --   HCT 39.3  --   PLT 221  --   CREATININE 0.89  --   TROPONINI  --  0.03*   Estimated Creatinine Clearance: 52.1 mL/min (by C-G formula based on SCr of 0.89 mg/dL).  Medical History: Past Medical History:  Diagnosis Date  . Arthritis   . Essential hypertension   . Gout   . Heart abnormality   . HTN (hypertension) 01/29/2017  . Hyperlipidemia     Medications:   Assessment: 76 yo female seen in the ED for chest pain x 24 hours. Serial troponins pending. Pt is admitted at [facility] and is to be evaluated by cardiology in the AM. Pharmacy has bee consulted for IV heparin dosing.  Goal of Therapy:  Heparin level goal: 0.3-0.7 units/ml Monitor platelets by anticoagulation protocol: Yes   Plan:  Heparin 4000 unit IV bolus Heparin infusion at 800 units/hr 6 hour heparin level  Preliminary review of pertinent patient information completed.  [facility] clinical pharmacist will complete review during morning rounds to assess the patient and finalize treatment regimen.  Jeani Hawking, [facility] 02/28/2017,11:44 PM

## 2017-03-01 DIAGNOSIS — I10 Essential (primary) hypertension: Secondary | ICD-10-CM | POA: Diagnosis not present

## 2017-03-01 DIAGNOSIS — D500 Iron deficiency anemia secondary to blood loss (chronic): Secondary | ICD-10-CM | POA: Diagnosis not present

## 2017-03-01 DIAGNOSIS — I422 Other hypertrophic cardiomyopathy: Secondary | ICD-10-CM

## 2017-03-01 DIAGNOSIS — I200 Unstable angina: Secondary | ICD-10-CM | POA: Diagnosis not present

## 2017-03-01 DIAGNOSIS — R748 Abnormal levels of other serum enzymes: Secondary | ICD-10-CM

## 2017-03-01 DIAGNOSIS — D649 Anemia, unspecified: Secondary | ICD-10-CM | POA: Diagnosis present

## 2017-03-01 DIAGNOSIS — I421 Obstructive hypertrophic cardiomyopathy: Secondary | ICD-10-CM | POA: Diagnosis present

## 2017-03-01 DIAGNOSIS — R079 Chest pain, unspecified: Secondary | ICD-10-CM | POA: Diagnosis not present

## 2017-03-01 DIAGNOSIS — M1990 Unspecified osteoarthritis, unspecified site: Secondary | ICD-10-CM | POA: Diagnosis present

## 2017-03-01 DIAGNOSIS — I25110 Atherosclerotic heart disease of native coronary artery with unstable angina pectoris: Secondary | ICD-10-CM | POA: Diagnosis present

## 2017-03-01 DIAGNOSIS — R0789 Other chest pain: Secondary | ICD-10-CM | POA: Diagnosis present

## 2017-03-01 DIAGNOSIS — I2510 Atherosclerotic heart disease of native coronary artery without angina pectoris: Secondary | ICD-10-CM | POA: Diagnosis not present

## 2017-03-01 DIAGNOSIS — M109 Gout, unspecified: Secondary | ICD-10-CM | POA: Diagnosis present

## 2017-03-01 DIAGNOSIS — E785 Hyperlipidemia, unspecified: Secondary | ICD-10-CM | POA: Diagnosis present

## 2017-03-01 LAB — HEPARIN LEVEL (UNFRACTIONATED)
HEPARIN UNFRACTIONATED: 0.75 [IU]/mL — AB (ref 0.30–0.70)
HEPARIN UNFRACTIONATED: 0.7 [IU]/mL (ref 0.30–0.70)

## 2017-03-01 LAB — APTT: aPTT: 144 seconds — ABNORMAL HIGH (ref 24–36)

## 2017-03-01 LAB — TROPONIN I
TROPONIN I: 0.18 ng/mL — AB (ref <0.03)
TROPONIN I: 0.48 ng/mL — AB (ref <0.03)
Troponin I: 0.21 ng/mL (ref <0.03)

## 2017-03-01 LAB — PROTIME-INR
INR: 0.91
Prothrombin Time: 12.2 seconds (ref 11.4–15.2)

## 2017-03-01 MED ORDER — SODIUM CHLORIDE 0.9 % WEIGHT BASED INFUSION: 3.0000 mL/kg/h | INTRAVENOUS | Status: AC

## 2017-03-01 MED ORDER — SODIUM CHLORIDE 0.9 % IV BOLUS (SEPSIS)
250.0000 mL | Freq: Once | INTRAVENOUS | Status: AC
  Administered 2017-03-01: 250 mL via INTRAVENOUS

## 2017-03-01 MED ORDER — SODIUM CHLORIDE 0.9 % WEIGHT BASED INFUSION
1.0000 mL/kg/h | INTRAVENOUS | Status: DC
  Administered 2017-03-01 – 2017-03-02 (×2): 1 mL/kg/h via INTRAVENOUS

## 2017-03-01 MED ORDER — SODIUM CHLORIDE 0.9% FLUSH
3.0000 mL | Freq: Two times a day (BID) | INTRAVENOUS | Status: DC
  Administered 2017-03-01 – 2017-03-02 (×2): 3 mL via INTRAVENOUS

## 2017-03-01 MED ORDER — SODIUM CHLORIDE 0.9 % IV SOLN
250.0000 mL | INTRAVENOUS | Status: DC | PRN
Start: 2017-03-01 — End: 2017-03-02

## 2017-03-01 MED ORDER — MORPHINE SULFATE (PF) 2 MG/ML IV SOLN: 2.0000 mg | INTRAVENOUS | Status: DC | PRN

## 2017-03-01 MED ORDER — ASPIRIN 81 MG PO CHEW
81.0000 mg | CHEWABLE_TABLET | ORAL | Status: AC
  Administered 2017-03-02: 81 mg via ORAL
  Filled 2017-03-01: qty 1

## 2017-03-01 MED ORDER — MORPHINE SULFATE (PF) 2 MG/ML IV SOLN
2.0000 mg | Freq: Once | INTRAVENOUS | Status: AC
  Administered 2017-03-01: 2 mg via INTRAVENOUS
  Filled 2017-03-01: qty 1

## 2017-03-01 MED ORDER — SODIUM CHLORIDE 0.9% FLUSH: 3.0000 mL | INTRAVENOUS | Status: DC | PRN

## 2017-03-01 NOTE — Progress Notes (Addendum)
ANTICOAGULATION CONSULT NOTE - Preliminary  Pharmacy Consult for Heparin Indication: ACS/STEMI  No Known Allergies  Patient Measurements: Height: 5\' 2"  (157.5 cm) Weight: 167 lb (75.8 kg) IBW/kg (Calculated) : 50.1 HEPARIN DW (KG): 66.6   Vital Signs: Temp: 98.2 F (36.8 C) (09/05 0544) Temp Source: Oral (09/05 0544) BP: 100/68 (09/05 0544) Pulse Rate: 87 (09/05 0544)  Labs:  Recent Labs  02/28/17 1916 02/28/17 2156 03/01/17 0508 03/01/17 0627  HGB 12.9  --   --   --   HCT 39.3  --   --   --   PLT 221  --   --   --   APTT  --   --  144*  --   LABPROT 12.2  --   --   --   INR 0.91  --   --   --   HEPARINUNFRC  --   --   --  0.75*  CREATININE 0.89  --   --   --   TROPONINI  --  0.03* 0.18*  --    Estimated Creatinine Clearance: 52.1 mL/min (by C-G formula based on SCr of 0.89 mg/dL).  Medical History: Past Medical History:  Diagnosis Date  . Arthritis   . Essential hypertension   . Gout   . Heart abnormality   . HTN (hypertension) 01/29/2017  . Hyperlipidemia     Medications:   Assessment: 76 yo female seen in the ED for chest pain x 24 hours. Serial troponins pending. Pt is admitted at [facility] and is to be evaluated by cardiology in the AM. Pharmacy has bee consulted for IV heparin dosing. Heparin level slightly above goal.  Goal of Therapy:  Heparin level goal: 0.3-0.7 units/ml Monitor platelets by anticoagulation protocol: Yes   Plan:  Decrease Heparin infusion at 700 units/hr Heparin level in 6 hours and daily CBC daily Monitor for S/S of bleeding  [facility], BS Elder Cyphers, BCPS Clinical Pharmacist Pager [phone] 03/01/2017,9:17 AM

## 2017-03-01 NOTE — Consult Note (Signed)
Cardiology Consultation:   Patient IDDeandria [name]; [id number]; 19-Jan-1941   Admit date: 02/28/2017 Date of Consult: 03/01/2017  Primary Care Provider: [name], MD Primary Cardiologist: Diona Browner Primary Electrophysiologist: N/A   Patient Profile:   Dana [name] is a 76 y.o. female with a hx of HOCM who is being seen today for the evaluation of Chest pain at the request of Dr. Toniann Fail.  History of Present Illness:    [name] is a female who does not speak [place] with a hx of HOCM CRF; HTN and elevated lipids. History of cardiac catheterization in [place] 08/07/14 with 40% nonobstructive LAD no obstructive disease. Echo 09/27/16 with severe LVH and LVOT velocity under 2 m/second with some ambiguity due to MR signal.    Seen in the hospital 01/30/17 with chest pain. EKG with chronic LVH with strain. Troponins flat at 0.03 0.06 and 0.05 Felt substernal chest pain may be from microvascular angina and low-dose verapamil was added. Repeat 2-D echo 02/01/17 LVEF 65-70% a chest CT(negative for PE, mild volume overload/CHF), and LVOT gradient hard to differentiate from MR signal likely 2-3 m/s. Myoview 01/30/17 was low risk LVEF 45-54% with no ST segment deviation during stress. No ischemia. Significant bowel artifact obscures some of the inferior and lateral wall. Patient was anemic hemoglobin 9.8.   I saw the patient post hospital 02/15/17 and she was doing well without further chest pain. She did have sinus bradycardia which was watching closely on verapamil and beta blocker.  Patient now returns to the hospital with 2 day history of left anterior chest pain present at rest but increases with exertion. EKG without acute change, initial troponin I negative, follow-up troponin 0.03, 0.18. EKG normal sinus rhythm at 96 bpm with LVH, no acute change  Patient's son isn't here at the present and she can't speak [place]. She indicates she is still having chest pain at a "5" but looks comfortable.  HR in 90's and she had been bradycardic in the office.  Past Medical History:  Diagnosis Date  . Arthritis   . Essential hypertension   . Gout   . Heart abnormality   . HTN (hypertension) 01/29/2017  . Hyperlipidemia     History reviewed. No pertinent surgical history.   Home Medications:  Prior to Admission medications   Medication Sig Start Date End Date Taking? Authorizing Provider  acetaminophen (TYLENOL 8 HOUR ARTHRITIS PAIN) 650 MG CR tablet Take 650 mg by mouth every 8 (eight) hours as needed for pain.   Yes [provider]  allopurinol (ZYLOPRIM) 300 MG tablet Take 300 mg by mouth daily.   Yes [provider]  aspirin EC 81 MG tablet Take 81 mg by mouth daily.   Yes [provider]  atorvastatin (LIPITOR) 10 MG tablet Take 10 mg by mouth daily.   Yes [provider]  folic acid (FOLVITE) 1 MG tablet Take 1 mg by mouth daily.   Yes [provider]  furosemide (LASIX) 40 MG tablet Take 40 mg by mouth daily as needed for fluid.    Yes [provider]  metoprolol (LOPRESSOR) 100 MG tablet Take 100 mg by mouth 2 (two) times daily.   Yes [provider]  nitroGLYCERIN (NITROSTAT) 0.4 MG SL tablet Place 0.4 mg under the tongue every 5 (five) minutes as needed for chest pain.   Yes [provider]  pantoprazole (PROTONIX) 40 MG tablet Take 1 tablet (40 mg total) by mouth daily at 6 (six)  AM. 01/31/17  Yes Rodolph Bong, MD  prednisoLONE acetate (PRED FORTE) 1 % ophthalmic suspension PLACE ONE DROP INTO THE RIGHT EYE TWICE DAILY 01/20/17  Yes [provider]  predniSONE (DELTASONE) 10 MG tablet TAKE ONE TABLET BY MOUTH ONCE DAILY FOR 7 DAYS STARTING ON 02/22/2017 02/08/17  Yes [provider]  RESTASIS MULTIDOSE 0.05 % ophthalmic emulsion PLACE ONE DROP IN BOTH EYES TWICE DAILY 12/26/16  Yes [provider]  verapamil (CALAN-SR) 120 MG CR tablet Take 1 tablet (120 mg total) by mouth daily.  01/30/17  Yes Rodolph Bong, MD    Inpatient Medications: Scheduled Meds: . allopurinol  300 mg Oral Daily  . aspirin EC  81 mg Oral Daily  . atorvastatin  10 mg Oral q1800  . cycloSPORINE  1 drop Both Eyes BID  . folic acid  1 mg Oral Daily  . metoprolol tartrate  100 mg Oral BID  . pantoprazole  40 mg Oral Q0600  . prednisoLONE acetate  1 drop Right Eye BID  . predniSONE  10 mg Oral Q breakfast  . verapamil  120 mg Oral Daily   Continuous Infusions: . heparin 800 Units/hr (03/01/17 0022)   PRN Meds: acetaminophen, ondansetron (ZOFRAN) IV  Allergies:   No Known Allergies  Social History:   Social History   Social History  . Marital status: Widowed    Spouse name: N/A  . Number of children: N/A  . Years of education: N/A   Occupational History  . Not on file.   Social History Main Topics  . Smoking status: Never Smoker  . Smokeless tobacco: Never Used  . Alcohol use No  . Drug use: No  . Sexual activity: Not on file   Other Topics Concern  . Not on file   Social History Narrative  . No narrative on file    Family History:     Family History  Problem Relation Age of Onset  . Hypertension Mother   . Liver disease Mother   . Alcohol abuse Father      ROS:  Please see the history of present illness.  Review of Systems  Reason unable to perform ROS: doesn't speak English, no interpreter here.        Physical Exam/Data:   Vitals:   02/28/17 2133 02/28/17 2230 03/01/17 0230 03/01/17 0544  BP: 100/86 95/74 98/71  100/68  Pulse: 98 92 93 87  Resp: 18 20 20 20   Temp:  98.4 F (36.9 C) 97.9 F (36.6 C) 98.2 F (36.8 C)  TempSrc:  Oral Oral Oral  SpO2: 97% 98% 100% 100%  Weight:  167 lb (75.8 kg)    Height:  5\' 2"  (1.575 m)      Intake/Output Summary (Last 24 hours) at 03/01/17 0759 Last data filed at 02/28/17 2019  Gross per 24 hour  Intake              500 ml  Output                0 ml  Net              500 ml   Filed Weights    02/28/17 1911 02/28/17 2230  Weight: 172 lb (78 kg) 167 lb (75.8 kg)   Body mass index is 30.54 kg/m.  General:  Well nourished, well developed, in no acute distress  HEENT: normal Lymph: no adenopathy Neck: no JVD Endocrine:  No thryomegaly Vascular: No carotid bruits; FA  pulses 2+ bilaterally without bruits  Cardiac:  normal S1, S2; RRR; 3-4/6 harsh systolic murmur LSB. Lungs:  clear to auscultation bilaterally, no wheezing, rhonchi or rales  Abd: soft, nontender, no hepatomegaly  Ext: no edema Musculoskeletal:  No deformities, BUE and BLE strength normal and equal Skin: warm and dry  Neuro:  CNs 2-12 intact, no focal abnormalities noted Psych:  Normal affect   EKG:  The EKG was personally reviewed and demonstrates:  Normal sinus rhythm at 96 bpm with LVH, no acute change Telemetry:  Telemetry was personally reviewed and demonstrates:  NSR 90's   Relevant CV Studies: 2-D echo 01/30/17 Study Conclusions   - Left ventricle: Findings consistant with HOCM> SAM LVOT gradient   hard to differentiate from MR signal but likely velocity is   between 2-69m/sec. Severe basal septal hypertrophy. Systolic   function was vigorous. The estimated ejection fraction was in the   range of 65% to 70%. - Left atrium: The atrium was mildly dilated. - Atrial septum: No defect or patent foramen ovale was identified. - Pulmonary arteries: PA peak pressure: 39 mm Hg (S).      Myoview 01/30/17  This is a low risk study.  The left ventricular ejection fraction is mildly decreased (45-54%).  There was no ST segment deviation noted during stress.   Significant bowel artifact obscures some of the inferior and lateral wall No ischemia EF 54% low risk study       Laboratory Data:  Chemistry Recent Labs Lab 02/28/17 1916  NA 135  K 3.5  CL 100*  CO2 25  GLUCOSE 114*  BUN 31*  CREATININE 0.89  CALCIUM 8.5*  GFRNONAA >60  GFRAA >60  ANIONGAP 10    No results for input(s): PROT,  ALBUMIN, AST, ALT, ALKPHOS, BILITOT in the last 168 hours. Hematology Recent Labs Lab 02/28/17 1916  WBC 11.5*  RBC 4.60  HGB 12.9  HCT 39.3  MCV 85.4  MCH 28.0  MCHC 32.8  RDW 16.3*  PLT 221   Cardiac Enzymes Recent Labs Lab 02/28/17 2156 03/01/17 0508  TROPONINI 0.03* 0.18*    Recent Labs Lab 02/28/17 1939  TROPIPOC 0.05    BNPNo results for input(s): BNP, PROBNP in the last 168 hours.  DDimer No results for input(s): DDIMER in the last 168 hours.  Radiology/Studies:  Dg Chest 2 View  Result Date: 02/28/2017 CLINICAL DATA:  Chest pain and weakness for 1 day. EXAM: CHEST  2 VIEW COMPARISON:  Chest radiographs and CTA 01/29/2017 FINDINGS: The cardiomediastinal silhouette is unchanged. Heart size is within normal limits. The descending thoracic aorta is mildly tortuous. The lungs are clear aside from minimal atelectasis or scarring in the left lung base. No pleural effusion or pneumothorax is identified. No acute osseous abnormality is seen. IMPRESSION: No active cardiopulmonary disease. Electronically Signed   By: Sebastian Ache M.D.   On: 02/28/2017 19:39    Assessment and Plan:   1. Recurrent chest pain with hospitalization last month for the same felt possibly secondary to microvascular angina improved with low-dose verapamil. Low risk Myoview 01/30/17. Still having chest pain, EKG non acute, troponins flat. She was bradycardic in the office and now HR in 90's-hasn't received her verapamil or metoprolol yet and held last night b/c of low BP. Watch to see how she does after she gets doses.  2. HOCM follow-up echo 01/30/17 LVOT gradient hard to differentiate from MR signal but likely velocity is between 2-3 m/s. Severe basal septal hypertrophy, systolic  function was vigorous EF 65-70%. Avoid dehydration and continue beta blocker. 3. Essential hypertension BP low and dropped after sl NTG in ER. Received IV fluids. ? Dehydration although labs don't indicate this. Will speak with son  when he arrives. 4. Hyperlipidemia on lipitor 5. Gouty arthritis on steroid taper   Signed, Jacolyn Reedy, PA-C  03/01/2017 7:59 AM   Attending note Patient seen and discussed with PA Geni Bers, I agree with her documentation above. 76 yo female history of recurring chest pain, HOCM, HTN, HL, admitted with chest pain.    01/2017 echo: severe baseal septal hypertrophy, difficult to assess gradient likely 2-3 m/s. 09/2016 echo: peak gradient about 2 m/s  01/2017 nuclear stress no clear ischemia K 3.5, Cr 0.89, WBC 11.5 Hgb 12.9 Plt 221  Trop neg-->0.0-->0.18-->0.21 EKG SR, LVH, inferior and lateral precordial ST depressions chronic  Patient with recurring episode of chest pain. Recent negative stress test though technically limited due to gut radiotracer uptake. History of HOCM and suspected chest pain was related to that. This admission concerning troponin trend in setting of chest pain that is trending up, has not peaked concerning for possible ACS. With chest pain and uptrending troponin difficult to be comfortable with diagnosis of microvascular ischemia and HOCM alone. We will plan for transfer to [facility] for cath to further evaluate her coronaries. If normal cath then continue aggressive treatment for her HOCM and likely microvascular ischemia. Current medical therapy with ASA, atorva 10, hep gtt, lopressor 100mg  bid, verapamil 120mg .  Discussed transfer with family who is present. He asks if we could arrange transfer this evening and plan for cath tomorrow, he is her son in law and is currently the only family in town and is not able to travel with her until this evening. He is concerned about her being along particularly given he is her only translator at the time, more family is due to come in town later today. We will work to accomodate family request, plan for [facility] transfer at 5pm and left heart cath tomorrow. Has had some soft bps, continue to monitor on lopressor and verapamil.      Dina Rich MD

## 2017-03-01 NOTE — Progress Notes (Signed)
[facility] PROGRESS NOTE  Dana [name] NID:[id number] DOB: Dec 11, 1940 DOA: 02/28/2017   PCP: [name], MD     Assessment/Plan: Principal Problem:   Chest pain Active Problems:   Hypertrophic cardiomyopathy (HCC)   HTN (hypertension)   Gout   Anemia   76 year old female with a history of  HOCM CRF; HTN and elevated lipids,Seen in the hospital 01/30/17 with chest pain.EKG with chronic LVH with strain.Troponins flat at 0.03 0.06 and 0.05 Felt substernal chest pain may be from microvascular angina and low-dose verapamil was added. Repeat 2-D echo 02/01/17 LVEF 65-70% a chest CT(negative for PE, mild volume overload/CHF),and LVOT gradient hard to differentiate from MR signal likely 2-3 m/s. Myoview 01/30/17 was low risk LVEF 45-54% with no ST segment deviation during stress, readmitted 9/4 with two-day history of left-sided chest pain with increasing troponins. Seen by cardiology transition to heparin drip and being transferred [facility] for cardiac catheterization  Assessment and plan    1. Chest pain - patient has had a negative stress test recently.  Cardiac markers increasing. Patient evaluated by Dr. Wyline Mood. This admission concerning for possible ACS. Recommended cardiac catheterization. Continue medical therapy with aspirin, statin, heparin drip, Lopressor, verapamil. 2. HOCM - will try to avoid dehydration. Continue Beta blockers and verapamil   3. Gout - with recent exacerbation patient on prednisone. 4. Hypertension presently blood pressure is in the low-normal closely observe. 5. Dyslipidemia-continue statin   DVT prophylaxsis heparin drip  Code Status:  Full code    Family Communication: Discussed in detail with the patient's son , all imaging results, lab results explained to the patient   Disposition Plan:  Transfer to  for cardiac catheterization      Consultants:  Cardiology  Procedures:  None  Antibiotics: Anti-infectives     None         HPI/Subjective:  continues to have chest pain , bu improved since this am  Objective: Vitals:   02/28/17 2230 03/01/17 0230 03/01/17 0544 03/01/17 1117  BP: 95/74 98/71 100/68   Pulse: 92 93 87   Resp: 20 20 20    Temp: 98.4 F (36.9 C) 97.9 F (36.6 C) 98.2 F (36.8 C)   TempSrc: Oral Oral Oral   SpO2: 98% 100% 100% 96%  Weight: 75.8 kg (167 lb)     Height: 5\' 2"  (1.575 m)       Intake/Output Summary (Last 24 hours) at 03/01/17 1222 Last data filed at 02/28/17 2019  Gross per 24 hour  Intake              500 ml  Output                0 ml  Net              500 ml    Exam:  Examination:  General exam: Appears calm and comfortable  Respiratory system: Clear to auscultation. Respiratory effort normal. Cardiovascular system: S1 & S2 heard, RRR. No JVD, murmurs, rubs, gallops or clicks. No pedal edema. Gastrointestinal system: Abdomen is nondistended, soft and nontender. No organomegaly or masses felt. Normal bowel sounds heard. Central nervous system: Alert and oriented. No focal neurological deficits. Extremities: Symmetric 5 x 5 power. Skin: No rashes, lesions or ulcers Psychiatry: Judgement and insight appear normal. Mood & affect appropriate.     Data Reviewed: I have personally reviewed following labs and imaging studies  Micro Results No results found for this or  any previous visit (from the past 240 hour(s)).  Radiology Reports Dg Chest 2 View  Result Date: 02/28/2017 CLINICAL DATA:  Chest pain and weakness for 1 day. EXAM: CHEST  2 VIEW COMPARISON:  Chest radiographs and CTA 01/29/2017 FINDINGS: The cardiomediastinal silhouette is unchanged. Heart size is within normal limits. The descending thoracic aorta is mildly tortuous. The lungs are clear aside from minimal atelectasis or scarring in the left lung base. No pleural effusion or pneumothorax is identified. No acute osseous abnormality is seen. IMPRESSION: No active cardiopulmonary disease.  Electronically Signed   By: Sebastian Ache M.D.   On: 02/28/2017 19:39   Nm Myocar Multi W/spect W/wall Motion / Ef  Result Date: 01/30/2017  This is a low risk study.  The left ventricular ejection fraction is mildly decreased (45-54%).  There was no ST segment deviation noted during stress.  Significant bowel artifact obscures some of the inferior and lateral wall No ischemia EF 54% low risk study     CBC  Recent Labs Lab 02/28/17 1916  WBC 11.5*  HGB 12.9  HCT 39.3  PLT 221  MCV 85.4  MCH 28.0  MCHC 32.8  RDW 16.3*    Chemistries   Recent Labs Lab 02/28/17 1916  NA 135  K 3.5  CL 100*  CO2 25  GLUCOSE 114*  BUN 31*  CREATININE 0.89  CALCIUM 8.5*   ------------------------------------------------------------------------------------------------------------------ estimated creatinine clearance is 52.1 mL/min (by C-G formula based on SCr of 0.89 mg/dL). ------------------------------------------------------------------------------------------------------------------ No results for input(s): HGBA1C in the last 72 hours. ------------------------------------------------------------------------------------------------------------------ No results for input(s): CHOL, HDL, LDLCALC, TRIG, CHOLHDL, LDLDIRECT in the last 72 hours. ------------------------------------------------------------------------------------------------------------------ No results for input(s): TSH, T4TOTAL, T3FREE, THYROIDAB in the last 72 hours.  Invalid input(s): FREET3 ------------------------------------------------------------------------------------------------------------------ No results for input(s): VITAMINB12, FOLATE, FERRITIN, TIBC, IRON, RETICCTPCT in the last 72 hours.  Coagulation profile  Recent Labs Lab 02/28/17 1916  INR 0.91    No results for input(s): DDIMER in the last 72 hours.  Cardiac Enzymes  Recent Labs Lab 02/28/17 2156 03/01/17 0508 03/01/17 0629  TROPONINI  0.03* 0.18* 0.21*   ------------------------------------------------------------------------------------------------------------------ Invalid input(s): POCBNP   CBG: No results for input(s): GLUCAP in the last 168 hours.     Studies: Dg Chest 2 View  Result Date: 02/28/2017 CLINICAL DATA:  Chest pain and weakness for 1 day. EXAM: CHEST  2 VIEW COMPARISON:  Chest radiographs and CTA 01/29/2017 FINDINGS: The cardiomediastinal silhouette is unchanged. Heart size is within normal limits. The descending thoracic aorta is mildly tortuous. The lungs are clear aside from minimal atelectasis or scarring in the left lung base. No pleural effusion or pneumothorax is identified. No acute osseous abnormality is seen. IMPRESSION: No active cardiopulmonary disease. Electronically Signed   By: Sebastian Ache M.D.   On: 02/28/2017 19:39      No results found for: HGBA1C Lab Results  Component Value Date   LDLCALC 56 01/30/2017   CREATININE 0.89 02/28/2017       Scheduled Meds: . allopurinol  300 mg Oral Daily  . aspirin EC  81 mg Oral Daily  . atorvastatin  10 mg Oral q1800  . cycloSPORINE  1 drop Both Eyes BID  . folic acid  1 mg Oral Daily  . metoprolol tartrate  100 mg Oral BID  . pantoprazole  40 mg Oral Q0600  . prednisoLONE acetate  1 drop Right Eye BID  . predniSONE  10 mg Oral Q breakfast  . verapamil  120 mg Oral  Daily   Continuous Infusions: . heparin 800 Units/hr (03/01/17 0022)     LOS: 0 days    Time spent: >30 MINS    Richarda Overlie  [facility] Pager [phone]. If 7PM-7AM, please contact night-coverage at [email or web address], password [facility] 03/01/2017, 12:22 PM  LOS: 0 days

## 2017-03-01 NOTE — Progress Notes (Signed)
Pt transferred to [facility] via [facility].  Report given to Rhohina, Charity fundraiser. Pt not having chest pain and vitals are stable.

## 2017-03-01 NOTE — Progress Notes (Signed)
ANTICOAGULATION CONSULT NOTE - follow up  Pharmacy Consult for Heparin Indication: ACS/STEMI  No Known Allergies  Patient Measurements: Height: 5\' 2"  (157.5 cm) Weight: 167 lb (75.8 kg) IBW/kg (Calculated) : 50.1 HEPARIN DW (KG): 66.6   Vital Signs: Temp: 98.8 F (37.1 C) (09/05 1500) Temp Source: Oral (09/05 1500) BP: 92/72 (09/05 1500) Pulse Rate: 105 (09/05 1500)  Labs:  Recent Labs  02/28/17 1916  03/01/17 0508 03/01/17 0627 03/01/17 0629 03/01/17 1635 03/01/17 1636  HGB 12.9  --   --   --   --   --   --   HCT 39.3  --   --   --   --   --   --   PLT 221  --   --   --   --   --   --   APTT  --   --  144*  --   --   --   --   LABPROT 12.2  --   --   --   --   --   --   INR 0.91  --   --   --   --   --   --   HEPARINUNFRC  --   --   --  0.75*  --   --  0.70  CREATININE 0.89  --   --   --   --   --   --   TROPONINI  --   < > 0.18*  --  0.21* 0.48*  --   < > = values in this interval not displayed. Estimated Creatinine Clearance: 52.1 mL/min (by C-G formula based on SCr of 0.89 mg/dL).  Medical History: Past Medical History:  Diagnosis Date  . Arthritis   . Essential hypertension   . Gout   . Heart abnormality   . HTN (hypertension) 01/29/2017  . Hyperlipidemia     Medications:   Assessment: 76 yo female seen in the ED for chest pain x 24 hours. Serial troponins pending. Pt is admitted at [facility] and is to be evaluated by cardiology in the AM. Pharmacy has bee consulted for IV heparin dosing. Heparin level therapeutic at high end of range.  Goal of Therapy:  Heparin level goal: 0.3-0.7 units/ml Monitor platelets by anticoagulation protocol: Yes   Plan:  Decrease Heparin infusion at 650 units/hr Heparin level daily CBC daily Monitor for S/S of bleeding  [facility], BS Elder Cyphers, BCPS Clinical Pharmacist Pager [phone] 03/01/2017,6:56 PM

## 2017-03-01 NOTE — Progress Notes (Signed)
CRITICAL VALUE ALERT  Critical Value:  Troponin 0.18  Date & Time Notied:  03/01/17 @ 0617  Provider Notified: Dr. Toniann Fail  Orders Received/Actions taken: Orders received

## 2017-03-01 NOTE — Progress Notes (Signed)
CRITICAL VALUE ALERT  Critical Value: Troponin  0.03  Date & Time Notied:  02/28/2017 @ 2303  Provider Notified: Dr. Toniann Fail  Orders Received/Actions taken: Order received for Heparin drip.

## 2017-03-01 NOTE — Care Management Obs Status (Signed)
MEDICARE OBSERVATION STATUS NOTIFICATION   Patient Details  Name: Dana [name] MRN: [id number] Date of Birth: 1940/11/24   Medicare Observation Status Notification Given:  Yes    Malcolm Metro, RN 03/01/2017, 2:01 PM

## 2017-03-02 ENCOUNTER — Ambulatory Visit (HOSPITAL_COMMUNITY): Admission: RE | Admit: 2017-03-02 | Payer: Medicare Other | Source: Ambulatory Visit | Admitting: Cardiovascular Disease

## 2017-03-02 ENCOUNTER — Encounter (HOSPITAL_COMMUNITY)
Admission: EM | Disposition: A | Discharge status: Home / Self Care | Payer: Self-pay | Source: Home / Self Care | Attending: Internal Medicine

## 2017-03-02 DIAGNOSIS — I200 Unstable angina: Secondary | ICD-10-CM

## 2017-03-02 DIAGNOSIS — I2510 Atherosclerotic heart disease of native coronary artery without angina pectoris: Secondary | ICD-10-CM

## 2017-03-02 DIAGNOSIS — I10 Essential (primary) hypertension: Secondary | ICD-10-CM

## 2017-03-02 HISTORY — PX: LEFT HEART CATH AND CORONARY ANGIOGRAPHY: CATH118249

## 2017-03-02 LAB — CBC
HEMATOCRIT: 34.8 % — AB (ref 36.0–46.0)
HEMOGLOBIN: 11.2 g/dL — AB (ref 12.0–15.0)
MCH: 27.5 pg (ref 26.0–34.0)
MCHC: 32.2 g/dL (ref 30.0–36.0)
MCV: 85.5 fL (ref 78.0–100.0)
Platelets: 188 10*3/uL (ref 150–400)
RBC: 4.07 MIL/uL (ref 3.87–5.11)
RDW: 16.2 % — ABNORMAL HIGH (ref 11.5–15.5)
WBC: 8.6 10*3/uL (ref 4.0–10.5)

## 2017-03-02 LAB — COMPREHENSIVE METABOLIC PANEL
ALBUMIN: 2.9 g/dL — AB (ref 3.5–5.0)
ALK PHOS: 51 U/L (ref 38–126)
ALT: 11 U/L — AB (ref 14–54)
AST: 22 U/L (ref 15–41)
Anion gap: 7 (ref 5–15)
BILIRUBIN TOTAL: 0.6 mg/dL (ref 0.3–1.2)
BUN: 22 mg/dL — ABNORMAL HIGH (ref 6–20)
CALCIUM: 8.3 mg/dL — AB (ref 8.9–10.3)
CO2: 25 mmol/L (ref 22–32)
CREATININE: 0.86 mg/dL (ref 0.44–1.00)
Chloride: 108 mmol/L (ref 101–111)
GFR calc non Af Amer: >60 mL/min (ref >60)
GLUCOSE: 94 mg/dL (ref 65–99)
Potassium: 3.7 mmol/L (ref 3.5–5.1)
SODIUM: 140 mmol/L (ref 135–145)
TOTAL PROTEIN: 5.7 g/dL — AB (ref 6.5–8.1)

## 2017-03-02 LAB — HEPARIN LEVEL (UNFRACTIONATED): Heparin Unfractionated: 0.47 IU/mL (ref 0.30–0.70)

## 2017-03-02 SURGERY — LEFT HEART CATH AND CORONARY ANGIOGRAPHY
Anesthesia: LOCAL

## 2017-03-02 MED ORDER — SODIUM CHLORIDE 0.9% FLUSH: 3.0000 mL | INTRAVENOUS | Status: DC | PRN

## 2017-03-02 MED ORDER — LIDOCAINE HCL (PF) 1 % IJ SOLN
INTRAMUSCULAR | Status: DC | PRN
  Administered 2017-03-02: 2 mL

## 2017-03-02 MED ORDER — SODIUM CHLORIDE 0.9% FLUSH
3.0000 mL | Freq: Two times a day (BID) | INTRAVENOUS | Status: DC
  Administered 2017-03-03: 3 mL via INTRAVENOUS

## 2017-03-02 MED ORDER — HEPARIN (PORCINE) IN NACL 2-0.9 UNIT/ML-% IJ SOLN
INTRAMUSCULAR | Status: AC | PRN
  Administered 2017-03-02: 1000 mL

## 2017-03-02 MED ORDER — IOPAMIDOL (ISOVUE-370) INJECTION 76%
INTRAVENOUS | Status: DC | PRN
  Administered 2017-03-02: 75 mL via INTRA_ARTERIAL

## 2017-03-02 MED ORDER — FENTANYL CITRATE (PF) 100 MCG/2ML IJ SOLN
INTRAMUSCULAR | Status: DC | PRN
  Administered 2017-03-02: 25 ug via INTRAVENOUS

## 2017-03-02 MED ORDER — ONDANSETRON HCL 4 MG/2ML IJ SOLN: 4.0000 mg | Freq: Four times a day (QID) | INTRAMUSCULAR | Status: DC | PRN

## 2017-03-02 MED ORDER — HEPARIN SODIUM (PORCINE) 1000 UNIT/ML IJ SOLN
INTRAMUSCULAR | Status: DC | PRN
  Administered 2017-03-02: 4000 [IU] via INTRAVENOUS

## 2017-03-02 MED ORDER — HEPARIN (PORCINE) IN NACL 2-0.9 UNIT/ML-% IJ SOLN
INTRAMUSCULAR | Status: DC | PRN
  Administered 2017-03-02: 8 mL via INTRA_ARTERIAL

## 2017-03-02 MED ORDER — CLOPIDOGREL BISULFATE 75 MG PO TABS
300.0000 mg | ORAL_TABLET | Freq: Once | ORAL | Status: AC
  Administered 2017-03-02: 300 mg via ORAL
  Filled 2017-03-02: qty 4

## 2017-03-02 MED ORDER — SODIUM CHLORIDE 0.9 % IV SOLN: INTRAVENOUS | Status: AC

## 2017-03-02 MED ORDER — CLOPIDOGREL BISULFATE 75 MG PO TABS
75.0000 mg | ORAL_TABLET | Freq: Every day | ORAL | Status: DC
  Administered 2017-03-03 – 2017-03-04 (×2): 75 mg via ORAL
  Filled 2017-03-02 (×2): qty 1

## 2017-03-02 MED ORDER — SODIUM CHLORIDE 0.9 % IV SOLN: 250.0000 mL | INTRAVENOUS | Status: DC | PRN

## 2017-03-02 MED ORDER — MIDAZOLAM HCL 2 MG/2ML IJ SOLN
INTRAMUSCULAR | Status: DC | PRN
  Administered 2017-03-02: 1 mg via INTRAVENOUS

## 2017-03-02 SURGICAL SUPPLY — 13 items
CATH IMPULSE 5F ANG/FL3.5 (CATHETERS) ×2 IMPLANT
CATH LAUNCHER 5F JL3.5 (CATHETERS) ×1 IMPLANT
CATH LAUNCHER 5F JR4 (CATHETERS) ×2 IMPLANT
CATHETER LAUNCHER 5F JL3.5 (CATHETERS) ×2
DEVICE RAD COMP TR BAND LRG (VASCULAR PRODUCTS) ×2 IMPLANT
GLIDESHEATH SLEND SS 6F .021 (SHEATH) ×2 IMPLANT
GUIDEWIRE INQWIRE 1.5J.035X260 (WIRE) ×1 IMPLANT
INQWIRE 1.5J .035X260CM (WIRE) ×2
KIT ESSENTIALS PG (KITS) ×2 IMPLANT
KIT HEART LEFT (KITS) ×2 IMPLANT
PACK CARDIAC CATHETERIZATION (CUSTOM PROCEDURE TRAY) ×2 IMPLANT
TRANSDUCER W/STOPCOCK (MISCELLANEOUS) ×2 IMPLANT
TUBING CIL FLEX 10 FLL-RA (TUBING) ×2 IMPLANT

## 2017-03-02 NOTE — Progress Notes (Signed)
ANTICOAGULATION CONSULT NOTE - follow up  Pharmacy Consult for Heparin Indication: ACS/STEMI  No Known Allergies  Patient Measurements: Height: 5\' 5"  (165.1 cm) Weight: 167 lb 14.4 oz (76.2 kg) IBW/kg (Calculated) : 57 HEPARIN DW (KG): 72.7   Vital Signs: Temp: 97.7 F (36.5 C) (09/06 0447) BP: 104/72 (09/06 0447) Pulse Rate: 87 (09/06 0447)  Labs:  Recent Labs  02/28/17 1916  03/01/17 0508 03/01/17 05/01/17 03/01/17 0629 03/01/17 1635 03/01/17 1636 03/02/17 0743  HGB 12.9  --   --   --   --   --   --  11.2*  HCT 39.3  --   --   --   --   --   --  34.8*  PLT 221  --   --   --   --   --   --  188  APTT  --   --  144*  --   --   --   --   --   LABPROT 12.2  --   --   --   --   --   --   --   INR 0.91  --   --   --   --   --   --   --   HEPARINUNFRC  --   --   --  0.75*  --   --  0.70 0.47  CREATININE 0.89  --   --   --   --   --   --  0.86  TROPONINI  --   < > 0.18*  --  0.21* 0.48*  --   --   < > = values in this interval not displayed. Estimated Creatinine Clearance: 57.7 mL/min (by C-G formula based on SCr of 0.86 mg/dL).  Medical History: Past Medical History:  Diagnosis Date  . Arthritis   . Essential hypertension   . Gout   . Heart abnormality   . HTN (hypertension) 01/29/2017  . Hyperlipidemia     Medications:   Assessment: 76 yo female seen in the ED for chest pain x 24 hours. Pharmacy has bee consulted for IV heparin dosing. Serial troponins pending upwards, most recent 0.48. Plan for cath today. Heparin level therapeutic at 0.47 this morning. H/H and platelets stable with no notes indicating signs/symptoms of bleeding.   Goal of Therapy:  Heparin level goal: 0.3-0.7 units/ml Monitor platelets by anticoagulation protocol: Yes   Plan:  Continue heparin infusion at 650 units/hr Heparin level and CBC daily Monitor for S/S of bleeding Follow up plan for [facility] after cath  [facility], PharmD Clinical Pharmacist  Phone: [phone] 03/02/2017,9:46 AM

## 2017-03-02 NOTE — Progress Notes (Signed)
Progress Note  Patient Name: Dana [name] Date of Encounter: 03/02/2017  Primary Cardiologist: Nona Dell  Subjective   Resting comfortably in bed/sleep. I spoke to the daughter who is also at the bedside. Quiet night without difficulty.  Inpatient Medications    Scheduled Meds: . allopurinol  300 mg Oral Daily  . aspirin EC  81 mg Oral Daily  . atorvastatin  10 mg Oral q1800  . cycloSPORINE  1 drop Both Eyes BID  . folic acid  1 mg Oral Daily  . metoprolol tartrate  100 mg Oral BID  . pantoprazole  40 mg Oral Q0600  . prednisoLONE acetate  1 drop Right Eye BID  . predniSONE  10 mg Oral Q breakfast  . sodium chloride flush  3 mL Intravenous Q12H  . verapamil  120 mg Oral Daily   Continuous Infusions: . sodium chloride    . sodium chloride 1 mL/kg/hr (03/02/17 1025)  . heparin 650 Units/hr (03/02/17 0115)   PRN Meds: sodium chloride, acetaminophen, morphine injection, ondansetron (ZOFRAN) IV, sodium chloride flush   Vital Signs    Vitals:   03/01/17 2225 03/02/17 0447 03/02/17 1019 03/02/17 1300  BP: 96/68 104/72 97/68 106/76  Pulse: 94 87 71 79  Resp: 16 (!) 26  (!) 21  Temp: 97.6 F (36.4 C) 97.7 F (36.5 C)  98 F (36.7 C)  TempSrc:    Oral  SpO2: 96% 98%  99%  Weight: 167 lb 8.8 oz (76 kg) 167 lb 14.4 oz (76.2 kg)    Height: 5\' 5"  (1.651 m)       Intake/Output Summary (Last 24 hours) at 03/02/17 1355 Last data filed at 03/02/17 1025  Gross per 24 hour  Intake          1477.87 ml  Output                0 ml  Net          1477.87 ml   Filed Weights   02/28/17 2230 03/01/17 2225 03/02/17 0447  Weight: 167 lb (75.8 kg) 167 lb 8.8 oz (76 kg) 167 lb 14.4 oz (76.2 kg)    Telemetry    Sinus rhythm and sinus bradycardia - Personally Reviewed  ECG    Normal sinus rhythm, left atrial abnormality, left ventricular hypertrophy with strain and interventricular conduction delay related to hypertrophy. - Personally Reviewed  Physical Exam  Elderly,  comfortable, asleep.  GEN: No acute distress.   Neck: No JVD Cardiac: RRR, there is a 2/6 systolic murmur, rubs, or gallops.  Respiratory: Clear to auscultation bilaterally. GI: Soft, nontender, non-distended  MS: No edema; No deformity. Femoral pulses are 2+ and symmetric. Radial pulses are 2+. Neuro:  Nonfocal  Psych: Normal affect   Labs    Chemistry Recent Labs Lab 02/28/17 1916 03/02/17 0743  NA 135 140  K 3.5 3.7  CL 100* 108  CO2 25 25  GLUCOSE 114* 94  BUN 31* 22*  CREATININE 0.89 0.86  CALCIUM 8.5* 8.3*  PROT  --  5.7*  ALBUMIN  --  2.9*  AST  --  22  ALT  --  11*  ALKPHOS  --  51  BILITOT  --  0.6  GFRNONAA >60 >60  GFRAA >60 >60  ANIONGAP 10 7     Hematology Recent Labs Lab 02/28/17 1916 03/02/17 0743  WBC 11.5* 8.6  RBC 4.60 4.07  HGB 12.9 11.2*  HCT 39.3 34.8*  MCV 85.4 85.5  MCH 28.0 27.5  MCHC 32.8 32.2  RDW 16.3* 16.2*  PLT 221 188    Cardiac Enzymes Recent Labs Lab 02/28/17 2156 03/01/17 0508 03/01/17 0629 03/01/17 1635  TROPONINI 0.03* 0.18* 0.21* 0.48*    Recent Labs Lab 02/28/17 1939  TROPIPOC 0.05     BNPNo results for input(s): BNP, PROBNP in the last 168 hours.   DDimer No results for input(s): DDIMER in the last 168 hours.   Radiology    Dg Chest 2 View  Result Date: 02/28/2017 CLINICAL DATA:  Chest pain and weakness for 1 day. EXAM: CHEST  2 VIEW COMPARISON:  Chest radiographs and CTA 01/29/2017 FINDINGS: The cardiomediastinal silhouette is unchanged. Heart size is within normal limits. The descending thoracic aorta is mildly tortuous. The lungs are clear aside from minimal atelectasis or scarring in the left lung base. No pleural effusion or pneumothorax is identified. No acute osseous abnormality is seen. IMPRESSION: No active cardiopulmonary disease. Electronically Signed   By: Sebastian Ache M.D.   On: 02/28/2017 19:39    Cardiac Studies   Echocardiography 01/30/2017: Study Conclusions  - Left ventricle:  Findings consistant with HOCM> SAM LVOT gradient   hard to differentiate from MR signal but likely velocity is   between 2-20m/sec. Severe basal septal hypertrophy. Systolic   function was vigorous. The estimated ejection fraction was in the   range of 65% to 70%. - Left atrium: The atrium was mildly dilated. - Atrial septum: No defect or patent foramen ovale was identified. - Pulmonary arteries: PA peak pressure: 39 mm Hg (S).   Patient Profile     76 y.o. female with a hx of HOCM whoWas seen for chest pain at [facility] by Dr. Wyline Mood and referred to [facility] last evening for coronary angiography to be performed on 03/02/2017  Assessment & Plan    1. 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 Trace positive cardiac markers. Coronary angiography is scheduled to be performed later today. Further management of chest pain will be based upon findings. The study will help [place] exclude significant obstructive coronary disease. If coronaries are patent, it is likely that her cardiomyopathy is responsible.    Signed, Lesleigh Noe, MD  03/02/2017, 1:55 PM

## 2017-03-02 NOTE — Progress Notes (Signed)
   03/01/17 2225  Vitals  Temp 97.6 F (36.4 C)  BP 96/68  BP Location Right Arm  BP Method Automatic  Patient Position (if appropriate) Sitting  Pulse Rate 94  Pulse Rate Source Dinamap;Monitor  Resp 16  Oxygen Therapy  SpO2 96 %  O2 Device Room Air  Height and Weight  Height 5\' 5"  (1.651 m)  Weight 76 kg (167 lb 8.8 oz)  BSA (Calculated - sq m) 1.87 sq meters  BMI (Calculated) 27.88  Weight in (lb) to have BMI = 25 149.9   Patient arrived from [facility]. Pt reported 5/10 heaviness on her chest when she arrived. Schorr, NP notified. Received order for a 250 bolus of NS d/t pt BP being soft. Also given an order of IV morphine for chest pain. EKG done. Dr. [facility] made aware regarding new EKG. Per oncall cardiology there's no significant changes from previous one. Pt. Given Allena Katz sandwhich and hot tea. Daughter at bedside. Will monitor.

## 2017-03-02 NOTE — Interval H&P Note (Signed)
History and Physical Interval Note:  03/02/2017 3:47 PM  Dana [name]  has presented today for surgery, with the diagnosis of cp  The various methods of treatment have been discussed with the patient and family. After consideration of risks, benefits and other options for treatment, the patient has consented to  Procedure(s): LEFT HEART CATH AND CORONARY ANGIOGRAPHY (N/A) as a surgical intervention .  The patient's history has been reviewed, patient examined, no change in status, stable for surgery.  I have reviewed the patient's chart and labs.  Questions were answered to the patient's satisfaction.     Tonny Bollman

## 2017-03-02 NOTE — H&P (View-Only) (Signed)
 Progress Note  Patient Name: Dana [name] Date of Encounter: 03/02/2017  Primary Cardiologist: Samuel McDowell  Subjective   Resting comfortably in bed/sleep. I spoke to the daughter who is also at the bedside. Quiet night without difficulty.  Inpatient Medications    Scheduled Meds: . allopurinol  300 mg Oral Daily  . aspirin EC  81 mg Oral Daily  . atorvastatin  10 mg Oral q1800  . cycloSPORINE  1 drop Both Eyes BID  . folic acid  1 mg Oral Daily  . metoprolol tartrate  100 mg Oral BID  . pantoprazole  40 mg Oral Q0600  . prednisoLONE acetate  1 drop Right Eye BID  . predniSONE  10 mg Oral Q breakfast  . sodium chloride flush  3 mL Intravenous Q12H  . verapamil  120 mg Oral Daily   Continuous Infusions: . sodium chloride    . sodium chloride 1 mL/kg/hr (03/02/17 1025)  . heparin 650 Units/hr (03/02/17 0115)   PRN Meds: sodium chloride, acetaminophen, morphine injection, ondansetron (ZOFRAN) IV, sodium chloride flush   Vital Signs    Vitals:   03/01/17 2225 03/02/17 0447 03/02/17 1019 03/02/17 1300  BP: 96/68 104/72 97/68 106/76  Pulse: 94 87 71 79  Resp: 16 (!) 26  (!) 21  Temp: 97.6 F (36.4 C) 97.7 F (36.5 C)  98 F (36.7 C)  TempSrc:    Oral  SpO2: 96% 98%  99%  Weight: 167 lb 8.8 oz (76 kg) 167 lb 14.4 oz (76.2 kg)    Height: 5' 5" (1.651 m)       Intake/Output Summary (Last 24 hours) at 03/02/17 1355 Last data filed at 03/02/17 1025  Gross per 24 hour  Intake          1477.87 ml  Output                0 ml  Net          1477.87 ml   Filed Weights   02/28/17 2230 03/01/17 2225 03/02/17 0447  Weight: 167 lb (75.8 kg) 167 lb 8.8 oz (76 kg) 167 lb 14.4 oz (76.2 kg)    Telemetry    Sinus rhythm and sinus bradycardia - Personally Reviewed  ECG    Normal sinus rhythm, left atrial abnormality, left ventricular hypertrophy with strain and interventricular conduction delay related to hypertrophy. - Personally Reviewed  Physical Exam  Elderly,  comfortable, asleep.  GEN: No acute distress.   Neck: No JVD Cardiac: RRR, there is a 2/6 systolic murmur, rubs, or gallops.  Respiratory: Clear to auscultation bilaterally. GI: Soft, nontender, non-distended  MS: No edema; No deformity. Femoral pulses are 2+ and symmetric. Radial pulses are 2+. Neuro:  Nonfocal  Psych: Normal affect   Labs    Chemistry Recent Labs Lab 02/28/17 1916 03/02/17 0743  NA 135 140  K 3.5 3.7  CL 100* 108  CO2 25 25  GLUCOSE 114* 94  BUN 31* 22*  CREATININE 0.89 0.86  CALCIUM 8.5* 8.3*  PROT  --  5.7*  ALBUMIN  --  2.9*  AST  --  22  ALT  --  11*  ALKPHOS  --  51  BILITOT  --  0.6  GFRNONAA >60 >60  GFRAA >60 >60  ANIONGAP 10 7     Hematology Recent Labs Lab 02/28/17 1916 03/02/17 0743  WBC 11.5* 8.6  RBC 4.60 4.07  HGB 12.9 11.2*  HCT 39.3 34.8*  MCV 85.4 85.5    MCH 28.0 27.5  MCHC 32.8 32.2  RDW 16.3* 16.2*  PLT 221 188    Cardiac Enzymes Recent Labs Lab 02/28/17 2156 03/01/17 0508 03/01/17 0629 03/01/17 1635  TROPONINI 0.03* 0.18* 0.21* 0.48*    Recent Labs Lab 02/28/17 1939  TROPIPOC 0.05     BNPNo results for input(s): BNP, PROBNP in the last 168 hours.   DDimer No results for input(s): DDIMER in the last 168 hours.   Radiology    Dg Chest 2 View  Result Date: 02/28/2017 CLINICAL DATA:  Chest pain and weakness for 1 day. EXAM: CHEST  2 VIEW COMPARISON:  Chest radiographs and CTA 01/29/2017 FINDINGS: The cardiomediastinal silhouette is unchanged. Heart size is within normal limits. The descending thoracic aorta is mildly tortuous. The lungs are clear aside from minimal atelectasis or scarring in the left lung base. No pleural effusion or pneumothorax is identified. No acute osseous abnormality is seen. IMPRESSION: No active cardiopulmonary disease. Electronically Signed   By: Sebastian Ache M.D.   On: 02/28/2017 19:39    Cardiac Studies   Echocardiography 01/30/2017: Study Conclusions  - Left ventricle:  Findings consistant with HOCM> SAM LVOT gradient   hard to differentiate from MR signal but likely velocity is   between 2-20m/sec. Severe basal septal hypertrophy. Systolic   function was vigorous. The estimated ejection fraction was in the   range of 65% to 70%. - Left atrium: The atrium was mildly dilated. - Atrial septum: No defect or patent foramen ovale was identified. - Pulmonary arteries: PA peak pressure: 39 mm Hg (S).   Patient Profile     76 y.o. female with a hx of HOCM whoWas seen for chest pain at [facility] by Dr. Wyline Mood and referred to [facility] last evening for coronary angiography to be performed on 03/02/2017  Assessment & Plan    1. 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 Trace positive cardiac markers. Coronary angiography is scheduled to be performed later today. Further management of chest pain will be based upon findings. The study will help [place] exclude significant obstructive coronary disease. If coronaries are patent, it is likely that her cardiomyopathy is responsible.    Signed, Lesleigh Noe, MD  03/02/2017, 1:55 PM

## 2017-03-03 ENCOUNTER — Encounter (HOSPITAL_COMMUNITY)
Admission: EM | Disposition: A | Discharge status: Home / Self Care | Payer: Self-pay | Source: Home / Self Care | Attending: Internal Medicine

## 2017-03-03 ENCOUNTER — Other Ambulatory Visit: Payer: Self-pay

## 2017-03-03 ENCOUNTER — Encounter (HOSPITAL_COMMUNITY): Payer: Self-pay | Admitting: Cardiovascular Disease

## 2017-03-03 DIAGNOSIS — I25110 Atherosclerotic heart disease of native coronary artery with unstable angina pectoris: Secondary | ICD-10-CM

## 2017-03-03 DIAGNOSIS — I200 Unstable angina: Secondary | ICD-10-CM

## 2017-03-03 HISTORY — PX: CORONARY BALLOON ANGIOPLASTY: CATH118233

## 2017-03-03 HISTORY — PX: CORONARY STENT INTERVENTION: CATH118234

## 2017-03-03 LAB — POCT ACTIVATED CLOTTING TIME
ACTIVATED CLOTTING TIME: 197 s
ACTIVATED CLOTTING TIME: 180 s
Activated Clotting Time: 257 seconds
Activated Clotting Time: 268 seconds

## 2017-03-03 SURGERY — CORONARY STENT INTERVENTION
Anesthesia: LOCAL

## 2017-03-03 MED ORDER — HEPARIN SODIUM (PORCINE) 1000 UNIT/ML IJ SOLN
INTRAMUSCULAR | Status: DC | PRN
  Administered 2017-03-03 (×2): 2000 [IU] via INTRAVENOUS
  Administered 2017-03-03: 6000 [IU] via INTRAVENOUS

## 2017-03-03 MED ORDER — ASPIRIN 81 MG PO CHEW
81.0000 mg | CHEWABLE_TABLET | ORAL | Status: AC
  Administered 2017-03-03: 81 mg via ORAL
  Filled 2017-03-03: qty 1

## 2017-03-03 MED ORDER — HEPARIN (PORCINE) IN NACL 2-0.9 UNIT/ML-% IJ SOLN
INTRAMUSCULAR | Status: AC | PRN
  Administered 2017-03-03: 1000 mL

## 2017-03-03 MED ORDER — SODIUM CHLORIDE 0.9% FLUSH: 3.0000 mL | Freq: Two times a day (BID) | INTRAVENOUS | Status: DC

## 2017-03-03 MED ORDER — SODIUM CHLORIDE 0.9 % IV SOLN: INTRAVENOUS | Status: DC

## 2017-03-03 MED ORDER — IOPAMIDOL (ISOVUE-370) INJECTION 76%
INTRAVENOUS | Status: AC
  Filled 2017-03-03: qty 50

## 2017-03-03 MED ORDER — FENTANYL CITRATE (PF) 100 MCG/2ML IJ SOLN
INTRAMUSCULAR | Status: AC
  Filled 2017-03-03: qty 2

## 2017-03-03 MED ORDER — HEPARIN SODIUM (PORCINE) 1000 UNIT/ML IJ SOLN
INTRAMUSCULAR | Status: AC
  Filled 2017-03-03: qty 1

## 2017-03-03 MED ORDER — SODIUM CHLORIDE 0.9 % IV SOLN: 250.0000 mL | INTRAVENOUS | Status: DC | PRN

## 2017-03-03 MED ORDER — MIDAZOLAM HCL 2 MG/2ML IJ SOLN
INTRAMUSCULAR | Status: DC | PRN
  Administered 2017-03-03: 1 mg via INTRAVENOUS

## 2017-03-03 MED ORDER — LIDOCAINE HCL (PF) 1 % IJ SOLN
INTRAMUSCULAR | Status: AC
  Filled 2017-03-03: qty 30

## 2017-03-03 MED ORDER — IOPAMIDOL (ISOVUE-370) INJECTION 76%
INTRAVENOUS | Status: AC
  Filled 2017-03-03: qty 100

## 2017-03-03 MED ORDER — SODIUM CHLORIDE 0.9% FLUSH: 3.0000 mL | INTRAVENOUS | Status: DC | PRN

## 2017-03-03 MED ORDER — MORPHINE SULFATE (PF) 4 MG/ML IV SOLN
2.0000 mg | INTRAVENOUS | Status: DC | PRN
Start: 2017-03-03 — End: 2017-03-04

## 2017-03-03 MED ORDER — MIDAZOLAM HCL 2 MG/2ML IJ SOLN
INTRAMUSCULAR | Status: AC
  Filled 2017-03-03: qty 2

## 2017-03-03 MED ORDER — NITROGLYCERIN 1 MG/10 ML FOR IR/CATH LAB
INTRA_ARTERIAL | Status: DC | PRN
  Administered 2017-03-03: 200 ug via INTRACORONARY

## 2017-03-03 MED ORDER — NITROGLYCERIN 1 MG/10 ML FOR IR/CATH LAB
INTRA_ARTERIAL | Status: AC
  Filled 2017-03-03: qty 10

## 2017-03-03 MED ORDER — LIDOCAINE HCL (PF) 1 % IJ SOLN
INTRAMUSCULAR | Status: DC | PRN
  Administered 2017-03-03: 15 mL

## 2017-03-03 MED ORDER — FENTANYL CITRATE (PF) 100 MCG/2ML IJ SOLN
INTRAMUSCULAR | Status: DC | PRN
  Administered 2017-03-03: 25 ug via INTRAVENOUS

## 2017-03-03 MED ORDER — SODIUM CHLORIDE 0.9 % WEIGHT BASED INFUSION
1.0000 mL/kg/h | INTRAVENOUS | Status: DC
  Administered 2017-03-03: 500 mL via INTRAVENOUS

## 2017-03-03 MED ORDER — IOPAMIDOL (ISOVUE-370) INJECTION 76%
INTRAVENOUS | Status: DC | PRN
  Administered 2017-03-03: 110 mL via INTRA_ARTERIAL

## 2017-03-03 MED ORDER — SODIUM CHLORIDE 0.9 % IV SOLN
250.0000 mL | INTRAVENOUS | Status: DC | PRN
Start: 2017-03-03 — End: 2017-03-04

## 2017-03-03 MED ORDER — ANGIOPLASTY BOOK
Freq: Once | Status: AC
  Administered 2017-03-03: 20:00:00
  Filled 2017-03-03: qty 1

## 2017-03-03 MED ORDER — HEPARIN (PORCINE) IN NACL 2-0.9 UNIT/ML-% IJ SOLN
INTRAMUSCULAR | Status: AC
  Filled 2017-03-03: qty 1000

## 2017-03-03 MED ORDER — SODIUM CHLORIDE 0.9 % WEIGHT BASED INFUSION
3.0000 mL/kg/h | INTRAVENOUS | Status: DC
  Administered 2017-03-03: 3 mL/kg/h via INTRAVENOUS

## 2017-03-03 MED ORDER — SODIUM CHLORIDE 0.9 % IV SOLN
INTRAVENOUS | Status: AC
  Administered 2017-03-03: 18:00:00 via INTRAVENOUS

## 2017-03-03 SURGICAL SUPPLY — 17 items
BALLN EMERGE MR 2.5X12 (BALLOONS) ×2
BALLN SAPPHIRE ~~LOC~~ 3.5X8 (BALLOONS) ×2 IMPLANT
BALLOON EMERGE MR 2.5X12 (BALLOONS) ×1 IMPLANT
CATH LAUNCHER 6FR EBU3.5 (CATHETERS) ×2 IMPLANT
COVER PRB 48X5XTLSCP FOLD TPE (BAG) ×1 IMPLANT
COVER PROBE 5X48 (BAG) ×1
KIT ENCORE 26 ADVANTAGE (KITS) ×2 IMPLANT
KIT HEART LEFT (KITS) ×2 IMPLANT
PACK CARDIAC CATHETERIZATION (CUSTOM PROCEDURE TRAY) ×2 IMPLANT
SHEATH PINNACLE 6F 10CM (SHEATH) ×2 IMPLANT
STENT SIERRA 3.00 X 12 MM (Permanent Stent) ×2 IMPLANT
TRANSDUCER W/STOPCOCK (MISCELLANEOUS) ×2 IMPLANT
TUBING CIL FLEX 10 FLL-RA (TUBING) ×2 IMPLANT
VALVE GUARDIAN II ~~LOC~~ HEMO (MISCELLANEOUS) ×2 IMPLANT
WIRE ASAHI PROWATER 180CM (WIRE) ×2 IMPLANT
WIRE EMERALD 3MM-J .035X150CM (WIRE) ×2 IMPLANT
WIRE MARVEL STR TIP 190CM (WIRE) ×2 IMPLANT

## 2017-03-03 NOTE — H&P (View-Only) (Signed)
Progress Note  Patient Name: Dana [name] Date of Encounter: 03/03/2017  Primary Cardiologist: Diona Browner   Subjective   No chest pain this morning. Planned for PCI today.   Inpatient Medications    Scheduled Meds: . allopurinol  300 mg Oral Daily  . aspirin EC  81 mg Oral Daily  . atorvastatin  10 mg Oral q1800  . clopidogrel  75 mg Oral Q breakfast  . cycloSPORINE  1 drop Both Eyes BID  . folic acid  1 mg Oral Daily  . metoprolol tartrate  100 mg Oral BID  . pantoprazole  40 mg Oral Q0600  . prednisoLONE acetate  1 drop Right Eye BID  . predniSONE  10 mg Oral Q breakfast  . sodium chloride flush  3 mL Intravenous Q12H  . verapamil  120 mg Oral Daily   Continuous Infusions: . sodium chloride     PRN Meds: sodium chloride, acetaminophen, morphine injection, ondansetron (ZOFRAN) IV, sodium chloride flush   Vital Signs    Vitals:   03/02/17 2040 03/02/17 2214 03/03/17 0405 03/03/17 0920  BP: 110/82 110/84 (!) 92/41 128/67  Pulse: 89 100 70 62  Resp: 18  16   Temp: 98.7 F (37.1 C)  97.8 F (36.6 C) 97.8 F (36.6 C)  TempSrc:    Oral  SpO2: 94%  94% 98%  Weight:   171 lb 4.8 oz (77.7 kg)   Height:        Intake/Output Summary (Last 24 hours) at 03/03/17 1013 Last data filed at 03/03/17 0410  Gross per 24 hour  Intake           933.41 ml  Output                0 ml  Net           933.41 ml   Filed Weights   03/01/17 2225 03/02/17 0447 03/03/17 0405  Weight: 167 lb 8.8 oz (76 kg) 167 lb 14.4 oz (76.2 kg) 171 lb 4.8 oz (77.7 kg)    Telemetry    SR - Personally Reviewed  ECG    N/a - Personally Reviewed  Physical Exam   General: Well developed, well nourished, female appearing in no acute distress. Head: Normocephalic, atraumatic.  Neck: Supple. Lungs:  Resp regular and unlabored, CTA. Heart: RRR, S1, S2, no S3, S4, 3/6 systolic murmur; no rub. Abdomen: Soft, non-tender, non-distended with normoactive bowel sounds. No hepatomegaly. No  rebound/guarding. No obvious abdominal masses. Extremities: No clubbing, cyanosis, edema. Distal pedal pulses are 2+ bilaterally. Right radial site stable with mild bruising.  Neuro: Alert and oriented X 3. Moves all extremities spontaneously. Psych: Normal affect.  Labs    Chemistry Recent Labs Lab 02/28/17 1916 03/02/17 0743  NA 135 140  K 3.5 3.7  CL 100* 108  CO2 25 25  GLUCOSE 114* 94  BUN 31* 22*  CREATININE 0.89 0.86  CALCIUM 8.5* 8.3*  PROT  --  5.7*  ALBUMIN  --  2.9*  AST  --  22  ALT  --  11*  ALKPHOS  --  51  BILITOT  --  0.6  GFRNONAA >60 >60  GFRAA >60 >60  ANIONGAP 10 7     Hematology Recent Labs Lab 02/28/17 1916 03/02/17 0743  WBC 11.5* 8.6  RBC 4.60 4.07  HGB 12.9 11.2*  HCT 39.3 34.8*  MCV 85.4 85.5  MCH 28.0 27.5  MCHC 32.8 32.2  RDW 16.3* 16.2*  PLT 221  188    Cardiac Enzymes Recent Labs Lab 02/28/17 2156 03/01/17 0508 03/01/17 0629 03/01/17 1635  TROPONINI 0.03* 0.18* 0.21* 0.48*    Recent Labs Lab 02/28/17 1939  TROPIPOC 0.05     BNPNo results for input(s): BNP, PROBNP in the last 168 hours.   DDimer No results for input(s): DDIMER in the last 168 hours.    Radiology    No results found.  Cardiac Studies   LHC: 03/02/17  Conclusion   1. Severe single-vessel coronary artery disease involving a large first obtuse marginal branch of the left dominant circumflex 2. Moderate LAD stenosis 3. Small nondominant RCA with no significant obstruction 4. Mild to moderate left main stenosis 5. Known hypertrophic cardiomyopathy  Recommend PCI of the obtuse marginal. 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 The patient will be loaded with Plavix. Will schedule her PCI for tomorrow. Would use femoral artery access and consider either a 5 [place] guide catheter or a guide catheter with sideholes. Plan discussed at length with the patient's daughter. Note the patient is  non-English speaking.      Patient Profile     76 y.o. female with a hx of HOCMwhoWas seen for chest pain at [facility] by Dr. Branch and referred to [facility] last evening for coronary angiography to be performed on 03/02/2017  Assessment & Plan    1. Recurrent chest pain: underwent cath yesterday with lesion in the large first OM of the Lcx. Unable to PCI yesterday using radial approach. Planned for PCI with femoral approach today. No chest pain this morning. Labs stable this am.   2. HOCM: follow-up echo 01/30/17 LVOT gradient hard to differentiate from MR signal but likely velocity is between 2-3 m/s. Severe basal septal hypertrophy, systolic function was vigorous EF 65-70%.   3. Essential hypertension: Stable with current therapy  4. Hyperlipidemia: on lipitor  Signed, Lindsay Roberts, NP  03/03/2017, 10:13 AM  Pager # [phone]   The patient has been seen in conjunction with Lindsay Roberts, NPC. All aspects of care have been considered and discussed. The patient has been personally interviewed, examined, and all clinical data has been reviewed.   Quiet night. Awaiting PCI from femoral approach.  Will be sure to check CBC and basic metabolic panel in the a.m.   

## 2017-03-03 NOTE — Progress Notes (Signed)
Progress Note  Patient Name: Dana [name] Date of Encounter: 03/03/2017  Primary Cardiologist: Diona Browner   Subjective   No chest pain this morning. Planned for PCI today.   Inpatient Medications    Scheduled Meds: . allopurinol  300 mg Oral Daily  . aspirin EC  81 mg Oral Daily  . atorvastatin  10 mg Oral q1800  . clopidogrel  75 mg Oral Q breakfast  . cycloSPORINE  1 drop Both Eyes BID  . folic acid  1 mg Oral Daily  . metoprolol tartrate  100 mg Oral BID  . pantoprazole  40 mg Oral Q0600  . prednisoLONE acetate  1 drop Right Eye BID  . predniSONE  10 mg Oral Q breakfast  . sodium chloride flush  3 mL Intravenous Q12H  . verapamil  120 mg Oral Daily   Continuous Infusions: . sodium chloride     PRN Meds: sodium chloride, acetaminophen, morphine injection, ondansetron (ZOFRAN) IV, sodium chloride flush   Vital Signs    Vitals:   03/02/17 2040 03/02/17 2214 03/03/17 0405 03/03/17 0920  BP: 110/82 110/84 (!) 92/41 128/67  Pulse: 89 100 70 62  Resp: 18  16   Temp: 98.7 F (37.1 C)  97.8 F (36.6 C) 97.8 F (36.6 C)  TempSrc:    Oral  SpO2: 94%  94% 98%  Weight:   171 lb 4.8 oz (77.7 kg)   Height:        Intake/Output Summary (Last 24 hours) at 03/03/17 1013 Last data filed at 03/03/17 0410  Gross per 24 hour  Intake           933.41 ml  Output                0 ml  Net           933.41 ml   Filed Weights   03/01/17 2225 03/02/17 0447 03/03/17 0405  Weight: 167 lb 8.8 oz (76 kg) 167 lb 14.4 oz (76.2 kg) 171 lb 4.8 oz (77.7 kg)    Telemetry    SR - Personally Reviewed  ECG    N/a - Personally Reviewed  Physical Exam   General: Well developed, well nourished, female appearing in no acute distress. Head: Normocephalic, atraumatic.  Neck: Supple. Lungs:  Resp regular and unlabored, CTA. Heart: RRR, S1, S2, no S3, S4, 3/6 systolic murmur; no rub. Abdomen: Soft, non-tender, non-distended with normoactive bowel sounds. No hepatomegaly. No  rebound/guarding. No obvious abdominal masses. Extremities: No clubbing, cyanosis, edema. Distal pedal pulses are 2+ bilaterally. Right radial site stable with mild bruising.  Neuro: Alert and oriented X 3. Moves all extremities spontaneously. Psych: Normal affect.  Labs    Chemistry Recent Labs Lab 02/28/17 1916 03/02/17 0743  NA 135 140  K 3.5 3.7  CL 100* 108  CO2 25 25  GLUCOSE 114* 94  BUN 31* 22*  CREATININE 0.89 0.86  CALCIUM 8.5* 8.3*  PROT  --  5.7*  ALBUMIN  --  2.9*  AST  --  22  ALT  --  11*  ALKPHOS  --  51  BILITOT  --  0.6  GFRNONAA >60 >60  GFRAA >60 >60  ANIONGAP 10 7     Hematology Recent Labs Lab 02/28/17 1916 03/02/17 0743  WBC 11.5* 8.6  RBC 4.60 4.07  HGB 12.9 11.2*  HCT 39.3 34.8*  MCV 85.4 85.5  MCH 28.0 27.5  MCHC 32.8 32.2  RDW 16.3* 16.2*  PLT 221  188    Cardiac Enzymes Recent Labs Lab 02/28/17 2156 03/01/17 0508 03/01/17 0629 03/01/17 1635  TROPONINI 0.03* 0.18* 0.21* 0.48*    Recent Labs Lab 02/28/17 1939  TROPIPOC 0.05     BNPNo results for input(s): BNP, PROBNP in the last 168 hours.   DDimer No results for input(s): DDIMER in the last 168 hours.    Radiology    No results found.  Cardiac Studies   LHC: 03/02/17  Conclusion   1. Severe single-vessel coronary artery disease involving a large first obtuse marginal branch of the left dominant circumflex 2. Moderate LAD stenosis 3. Small nondominant RCA with no significant obstruction 4. Mild to moderate left main stenosis 5. Known hypertrophic cardiomyopathy  Recommend PCI of the obtuse marginal. 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 The patient will be loaded with Plavix. Will schedule her PCI for tomorrow. Would use femoral artery access and consider either a 5 [place] guide catheter or a guide catheter with sideholes. Plan discussed at length with the patient's daughter. Note the patient is  non-English speaking.      Patient Profile     76 y.o. female with a hx of HOCMwhoWas seen for chest pain at [facility] by Dr. Wyline Mood and referred to [facility] last evening for coronary angiography to be performed on 03/02/2017  Assessment & Plan    1. Recurrent chest pain: underwent cath yesterday with lesion in the large first OM of the Lcx. Unable to PCI yesterday using radial approach. Planned for PCI with femoral approach today. No chest pain this morning. Labs stable this am.   2. HOCM: follow-up echo 01/30/17 LVOT gradient hard to differentiate from MR signal but likely velocity is between 2-3 m/s. Severe basal septal hypertrophy, systolic function was vigorous EF 65-70%.   3. Essential hypertension: Stable with current therapy  4. Hyperlipidemia: on lipitor  Signed, Laverda Page, NP  03/03/2017, 10:13 AM  Pager # [phone]   The patient has been seen in conjunction with Laverda Page, [facility] All aspects of care have been considered and discussed. The patient has been personally interviewed, examined, and all clinical data has been reviewed.   Quiet night. Awaiting PCI from femoral approach.  Will be sure to check CBC and basic metabolic panel in the a.m.

## 2017-03-03 NOTE — Plan of Care (Signed)
Problem: Physical Regulation: Goal: Ability to maintain clinical measurements within normal limits will improve Outcome: Progressing Pt still c/o CP 6/10 ongoing since cath. Unchanged per pt. TR band now removed. Oozing at site not stopped. Dressing applied. VSS. Will continue to monitor.   Problem: Bowel/Gastric: Goal: Will not experience complications related to bowel motility Outcome: Not Progressing Pt c/o some constipation. Will address w/ oncoming shift.

## 2017-03-03 NOTE — Progress Notes (Signed)
Site area: right groin  Site Prior to Removal:  Level 0  Pressure Applied For 20 MINUTES    Minutes Beginning at 20:54  Manual:   Yes.    Patient Status During Pull:  WNL  Post Pull Groin Site:  Level 0  Post Pull Instructions Given:  Yes.    Post Pull Pulses Present:  Yes.    Dressing Applied:  Yes  Comments:   Pressure dressing applied.Pt tolerated well.

## 2017-03-03 NOTE — Interval H&P Note (Signed)
History and Physical Interval Note:  03/03/2017 3:59 PM  Dana [name]  has presented today for surgery, with the diagnosis of cad - plan for staged PCI.    The various methods of treatment have been discussed with the patient and family. After consideration of risks, benefits and other options for treatment, the patient has consented to  Procedure(s): CORONARY STENT INTERVENTION (N/A) as a surgical intervention .  The patient's history has been reviewed, patient examined, no change in status, stable for surgery.  I have reviewed the patient's chart and labs.  Questions were answered to the patient's satisfaction.    Cath Lab Visit (complete for each Cath Lab visit)  Clinical Evaluation Leading to the Procedure:   ACS: No.  Non-ACS:    Anginal Classification: CCS III  Anti-ischemic medical therapy: Minimal Therapy (1 class of medications)  Non-Invasive Test Results: No non-invasive testing performed  Prior CABG: No previous CABG   Bryan Lemma

## 2017-03-03 NOTE — Progress Notes (Signed)
Pt's right TR Band removed as per protocol. Right radial site was level 0 on admission and level 0 now post removal. Clean dry drsg applied. Palpable 2+ radial pulse. CSM wnl.  Pt instructed on rue mobility instructions.

## 2017-03-04 DIAGNOSIS — D500 Iron deficiency anemia secondary to blood loss (chronic): Secondary | ICD-10-CM

## 2017-03-04 LAB — CBC
HCT: 31.2 % — ABNORMAL LOW (ref 36.0–46.0)
Hemoglobin: 9.9 g/dL — ABNORMAL LOW (ref 12.0–15.0)
MCH: 27.3 pg (ref 26.0–34.0)
MCHC: 31.7 g/dL (ref 30.0–36.0)
MCV: 86 fL (ref 78.0–100.0)
PLATELETS: 159 10*3/uL (ref 150–400)
RBC: 3.63 MIL/uL — AB (ref 3.87–5.11)
RDW: 16.3 % — ABNORMAL HIGH (ref 11.5–15.5)
WBC: 6.2 10*3/uL (ref 4.0–10.5)

## 2017-03-04 LAB — BASIC METABOLIC PANEL
Anion gap: 4 — ABNORMAL LOW (ref 5–15)
BUN: 17 mg/dL (ref 6–20)
CALCIUM: 8.1 mg/dL — AB (ref 8.9–10.3)
CHLORIDE: 111 mmol/L (ref 101–111)
CO2: 24 mmol/L (ref 22–32)
CREATININE: 1.02 mg/dL — AB (ref 0.44–1.00)
GFR calc Af Amer: >60 mL/min (ref >60)
GFR calc non Af Amer: 52 mL/min — ABNORMAL LOW (ref >60)
GLUCOSE: 102 mg/dL — AB (ref 65–99)
Potassium: 3.9 mmol/L (ref 3.5–5.1)
Sodium: 139 mmol/L (ref 135–145)

## 2017-03-04 MED ORDER — CLOPIDOGREL BISULFATE 75 MG PO TABS: 75.0000 mg | ORAL_TABLET | Freq: Every day | ORAL | 11 refills | Status: DC

## 2017-03-04 MED ORDER — NITROGLYCERIN 0.4 MG SL SUBL: 0.4000 mg | SUBLINGUAL_TABLET | SUBLINGUAL | 2 refills | Status: DC | PRN

## 2017-03-04 NOTE — Progress Notes (Signed)
Please note pt no longer on prednisone and stopped.

## 2017-03-04 NOTE — Progress Notes (Addendum)
Progress Note  Patient Name: Dana [name] Date of Encounter: 03/04/2017  Primary Cardiologist: Dr. Diona Browner   Subjective   No complaints. She denies CP and dyspnea. Ambulating ok. Groin stable.   Inpatient Medications    Scheduled Meds: . allopurinol  300 mg Oral Daily  . aspirin EC  81 mg Oral Daily  . atorvastatin  10 mg Oral q1800  . clopidogrel  75 mg Oral Q breakfast  . cycloSPORINE  1 drop Both Eyes BID  . folic acid  1 mg Oral Daily  . metoprolol tartrate  100 mg Oral BID  . pantoprazole  40 mg Oral Q0600  . prednisoLONE acetate  1 drop Right Eye BID  . predniSONE  10 mg Oral Q breakfast  . sodium chloride flush  3 mL Intravenous Q12H  . sodium chloride flush  3 mL Intravenous Q12H  . verapamil  120 mg Oral Daily   Continuous Infusions: . sodium chloride    . sodium chloride     PRN Meds: sodium chloride, sodium chloride, acetaminophen, morphine injection, ondansetron (ZOFRAN) IV, sodium chloride flush, sodium chloride flush   Vital Signs    Vitals:   03/04/17 0100 03/04/17 0200 03/04/17 0400 03/04/17 0708  BP: (!) 90/45 (!) 99/51 99/60 (!) 108/53  Pulse: 67 (!) 55 61 67  Resp: (!) 22 19 (!) 26 (!) 32  Temp:   97.9 F (36.6 C) 98.4 F (36.9 C)  TempSrc:   Oral Oral  SpO2: 94% 96% 97% 96%  Weight:   174 lb 2.6 oz (79 kg)   Height:        Intake/Output Summary (Last 24 hours) at 03/04/17 0832 Last data filed at 03/04/17 0827  Gross per 24 hour  Intake          1819.17 ml  Output             2100 ml  Net          -280.83 ml   Filed Weights   03/02/17 0447 03/03/17 0405 03/04/17 0400  Weight: 167 lb 14.4 oz (76.2 kg) 171 lb 4.8 oz (77.7 kg) 174 lb 2.6 oz (79 kg)    Telemetry    NSR  - Personally Reviewed  ECG    NSR - Personally Reviewed  Physical Exam   GEN: No acute distress.   Neck: No JVD Cardiac: RRR, no murmurs, rubs, or gallops.  Respiratory: Clear to auscultation bilaterally. GI: Soft, nontender, non-distended  MS: No edema;  No deformity. Neuro:  Nonfocal  Psych: Normal affect   Labs    Chemistry Recent Labs Lab 02/28/17 1916 03/02/17 0743 03/04/17 0432  NA 135 140 139  K 3.5 3.7 3.9  CL 100* 108 111  CO2 25 25 24   GLUCOSE 114* 94 102*  BUN 31* 22* 17  CREATININE 0.89 0.86 1.02*  CALCIUM 8.5* 8.3* 8.1*  PROT  --  5.7*  --   ALBUMIN  --  2.9*  --   AST  --  22  --   ALT  --  11*  --   ALKPHOS  --  51  --   BILITOT  --  0.6  --   GFRNONAA >60 >60 52*  GFRAA >60 >60 >60  ANIONGAP 10 7 4*     Hematology Recent Labs Lab 02/28/17 1916 03/02/17 0743 03/04/17 0432  WBC 11.5* 8.6 6.2  RBC 4.60 4.07 3.63*  HGB 12.9 11.2* 9.9*  HCT 39.3 34.8* 31.2*  MCV 85.4  85.5 86.0  MCH 28.0 27.5 27.3  MCHC 32.8 32.2 31.7  RDW 16.3* 16.2* 16.3*  PLT 221 188 159    Cardiac Enzymes Recent Labs Lab 02/28/17 2156 03/01/17 0508 03/01/17 0629 03/01/17 1635  TROPONINI 0.03* 0.18* 0.21* 0.48*    Recent Labs Lab 02/28/17 1939  TROPIPOC 0.05     BNPNo results for input(s): BNP, PROBNP in the last 168 hours.   DDimer No results for input(s): DDIMER in the last 168 hours.   Radiology    No results found.  Cardiac Studies   LHC 01/30/17 Conclusion   1. Severe single-vessel coronary artery disease involving a large first obtuse marginal branch of the left dominant circumflex 2. Moderate LAD stenosis 3. Small nondominant RCA with no significant obstruction 4. Mild to moderate left main stenosis 5. Known hypertrophic cardiomyopathy   Intervention 01/31/17 Procedures   CORONARY BALLOON ANGIOPLASTY  CORONARY STENT INTERVENTION  Conclusion     Ost LM lesion, 40 %stenosed.  1st Mrg lesion, 90 %stenosed.  A STENT SIERRA 3.00 X 12 MM drug eluting stent was successfully placed.  Post intervention, there is a 0% residual stenosis.  Lat 1st Mrg lesion, 60 %stenosed. Bifurcation lesion jailed by the initial stent  Post intervention - rescue PTCA only, there is a 20% residual stenosis.      Patient Profile     76 y.o. female with a hx of HOCMwho was seen for chest pain at [facility] by Dr. Wyline Mood and referred to [facility] for coronary angiography to be performed on 03/02/2017, given CP concerning for cardiac etiology.   Assessment & Plan    1.  CAD: LHC this admit showed severe single vessel CAD involving a large first obtuse marginal branch of the left dominant circumflex, Moderate LAD stenosis, Small nondominant RCA with no significant obstruction, Mild to moderate left main stenosis. The OM was treated with PCI  + DES. Medical therapy for residual CAD. EF normal by echo, 65-70%. Continue DAPT with ASA + Plavix, BB and statin therapy.   2.  HOCM: follow-up echo 8/6/18LVOT gradient hard to differentiate from MR signal but likely velocity is between 2-3 m/s. Severe basal septal hypertrophy, systolic function was vigorous EF 65-70%. She is on BB and CCB.   3. Essential hypertension: BP has been soft this admit. Pt written to get Lopressor, 100 mg, BID. She is also on verapamil, 120. May need to reduce dose and reassess BP in clinic. Will defer to MD. HR in the 60s.   4. Hyperlipidemia: on Lipitor. LDL at goat at 56 mg/dL.   5. Anemia: drop in Hgb from 11.2>>9.9. Femoral access site is stable. No hematoma. She will need a f/u CBC outpatient.   Dispo: pt on IM service. We will arrange f/u in our office with Dr. Diona Browner.   For questions or updates, please contact [facility] Please consult [email or web address] for contact info under Cardiology/STEMI. Daytime calls, contact the Day Call APP (6a-8a) or assigned team (Teams A-D) provider (7:30a - 5p). All other daytime calls (7:30-5p), contact the Card Master @ [phone].   Nighttime calls, contact the assigned APP (5p-8p) or MD (6:30p-8p). Overnight calls (8p-6a), contact the on call Fellow @ [phone].      Signed, Robbie Lis, PA-C  03/04/2017, 8:32 AM    I have seen, examined the patient, and  reviewed the above assessment and plan.  On exam, RRR with 2/6 SEM LUSB.  no hematoma/ bruit.  Changes to above are made where  necessary.   Cardiac rehab is encouraged.  Will arrange close outpatient cardiology follow-up.  OK to discharge to home from my standpoint on current medicines.  Hypertrophic CM also noted.  Will follow with Dr Diona Browner for this.  Cardiology team to see as needed while here. Please call with questions.   Co Sign: Hillis Range, MD 03/04/2017 9:05 AM

## 2017-03-04 NOTE — Progress Notes (Signed)
CARDIAC REHAB PHASE I   PRE:  Rate/Rhythm: 73 nsr  BP:  Sitting: 120/65      SaO2: 98 ra  MODE:  Ambulation: 500 ft   POST:  Rate/Rhythm: 83 nsr  BP:  Sitting: 112/49     SaO2: 97 ra [id number] Patient ambulated in hallway independently. Accompanied by CR RN and patients daughter. Steady gait noted. Patient denied complaints per daughter. Patient speaks basic english. Native [place]. Adult grandson lives with patient however daughter very active in patients care and takes patient to all appointments. Education completed with patient and daughter including antiplatelet therapy, diet, exercise guidelines, nitro/911, and risk factors. With patient and daughter permission phase 2 cardiac rehab order placed for AP. Patient lives within sight distance of hospital..   Turner Daniels, BSN 03/04/2017 9:28 AM

## 2017-03-04 NOTE — Discharge Summary (Signed)
Discharge Summary    Patient ID: Dana [name],  MRN: [id number], DOB/AGE: [birth year] 76 y.o.  Admit date: 02/28/2017 Discharge date: 03/04/2017  Primary Care Provider: [name] Primary Cardiologist: Dr. Diona Browner  Discharge Diagnoses    Principal Problem:   Chest pain Active Problems:   Hypertrophic cardiomyopathy (HCC)   HTN (hypertension)   Gout   Anemia   Unstable angina (HCC)   Coronary artery disease involving native coronary artery of native heart with unstable angina pectoris (HCC)   Allergies No Known Allergies  Diagnostic Studies/Procedures    LHC 01/30/17 Conclusion   1. Severe single-vessel coronary artery disease involving a large first obtuse marginal branch of the left dominant circumflex 2. Moderate LAD stenosis 3. Small nondominant RCA with no significant obstruction 4. Mild to moderate left main stenosis 5. Known hypertrophic cardiomyopathy   Intervention 01/31/17 Procedures   CORONARY BALLOON ANGIOPLASTY  CORONARY STENT INTERVENTION  Conclusion     Ost LM lesion, 40 %stenosed.  1st Mrg lesion, 90 %stenosed.  A STENT SIERRA 3.00 X 12 MM drug eluting stent was successfully placed.  Post intervention, there is a 0% residual stenosis.  Lat 1st Mrg lesion, 60 %stenosed. Bifurcation lesion jailed by the initial stent  Post intervention - rescue PTCA only, there is a 20% residual stenosis.      History of Present Illness     76 y.o.femalewith a hx of HOCMwho was seen for chest pain at [facility] by Dr. Wyline Mood and referred to [facility] for coronary angiography to be performed on 03/02/2017, given CP concerning for cardiac etiology.   Hospital Course     Patient was admitted to [facility] for cath. See medical management of hospital medical problems below.  1.  CAD: LHC this admit showed severe single vessel CAD involving a large first obtuse marginal branch of the left dominant circumflex, Moderate LAD stenosis, Small nondominant  RCA with no significant obstruction, Mild to moderate left main stenosis. The OM was treated with PCI  + DES. Medical therapy for residual CAD. EF normal by echo, 65-70%. Continue DAPT with ASA + Plavix, BB and statin therapy.  2.  HOCM: follow-up echo 8/6/18LVOT gradient hard to differentiate from MR signal but likely velocity is between 2-3 m/s. Severe basal septal hypertrophy, systolic function was vigorous EF 65-70%. She is on BB and CCB.   3. Essential hypertension:BP controlled on current regimen. No changes made.  4. Hyperlipidemia: on Lipitor. LDL at goat at 56 mg/dL.   5. Anemia: drop in Hgb from 11.2>>9.9. Femoral access site is stable. No hematoma. She will need a f/u CBC outpatient.   Dispo: pt was seen and examined by Dr. Johney Frame on 03/04/17. Pt denied CP or dyspnea. She was ambulating well with cardiac rehab w/o exertional symptoms. Her femoral cath site was stable. VSS and renal function stable. Dr. Johney Frame felt that she was stable for discharge home. She will f/u with Dr. Diona Browner in [place].   Consultants: none     Discharge Vitals Blood pressure (!) 108/53, pulse 67, temperature 98.4 F (36.9 C), temperature source Oral, resp. rate (!) 32, height 5\' 5"  (1.651 m), weight 174 lb 2.6 oz (79 kg), SpO2 96 %.  Filed Weights   03/02/17 0447 03/03/17 0405 03/04/17 0400  Weight: 167 lb 14.4 oz (76.2 kg) 171 lb 4.8 oz (77.7 kg) 174 lb 2.6 oz (79 kg)    Labs & Radiologic Studies    CBC  Recent Labs  03/02/17 0743  03/04/17 0432  WBC 8.6 6.2  HGB 11.2* 9.9*  HCT 34.8* 31.2*  MCV 85.5 86.0  PLT 188 159   Basic Metabolic Panel  Recent Labs  03/02/17 0743 03/04/17 0432  NA 140 139  K 3.7 3.9  CL 108 111  CO2 25 24  GLUCOSE 94 102*  BUN 22* 17  CREATININE 0.86 1.02*  CALCIUM 8.3* 8.1*   Liver Function Tests  Recent Labs  03/02/17 0743  AST 22  ALT 11*  ALKPHOS 51  BILITOT 0.6  PROT 5.7*  ALBUMIN 2.9*   No results for input(s): LIPASE, AMYLASE in  the last 72 hours. Cardiac Enzymes  Recent Labs  03/01/17 1635  TROPONINI 0.48*   BNP Invalid input(s): POCBNP D-Dimer No results for input(s): DDIMER in the last 72 hours. Hemoglobin A1C No results for input(s): HGBA1C in the last 72 hours. Fasting Lipid Panel No results for input(s): CHOL, HDL, LDLCALC, TRIG, CHOLHDL, LDLDIRECT in the last 72 hours. Thyroid Function Tests No results for input(s): TSH, T4TOTAL, T3FREE, THYROIDAB in the last 72 hours.  Invalid input(s): FREET3 _____________  Dg Chest 2 View  Result Date: 02/28/2017 CLINICAL DATA:  Chest pain and weakness for 1 day. EXAM: CHEST  2 VIEW COMPARISON:  Chest radiographs and CTA 01/29/2017 FINDINGS: The cardiomediastinal silhouette is unchanged. Heart size is within normal limits. The descending thoracic aorta is mildly tortuous. The lungs are clear aside from minimal atelectasis or scarring in the left lung base. No pleural effusion or pneumothorax is identified. No acute osseous abnormality is seen. IMPRESSION: No active cardiopulmonary disease. Electronically Signed   By: Sebastian Ache M.D.   On: 02/28/2017 19:39   Disposition   Pt is being discharged home today in good condition.  Follow-up Plans & Appointments    Follow-up Information    Jonelle Sidle, MD Follow up.   Specialty:  Cardiology Why:  our office will call you with a follow-up appointment in 1-2 weeks  Contact information: [address] [place] [address] [phone]          Discharge Instructions    Amb Referral to Cardiac Rehabilitation    Complete by:  As directed    Diagnosis:  Coronary Stents   Diet - low sodium heart healthy    Complete by:  As directed    Increase activity slowly    Complete by:  As directed       Discharge Medications   Current Discharge Medication List    START taking these medications   Details  clopidogrel (PLAVIX) 75 MG tablet Take 1 tablet (75 mg total) by mouth daily with  breakfast. Qty: 30 tablet, Refills: 11      CONTINUE these medications which have CHANGED   Details  nitroGLYCERIN (NITROSTAT) 0.4 MG SL tablet Place 1 tablet (0.4 mg total) under the tongue every 5 (five) minutes as needed for chest pain. Qty: 25 tablet, Refills: 2      CONTINUE these medications which have NOT CHANGED   Details  acetaminophen (TYLENOL 8 HOUR ARTHRITIS PAIN) 650 MG CR tablet Take 650 mg by mouth every 8 (eight) hours as needed for pain.    allopurinol (ZYLOPRIM) 300 MG tablet Take 300 mg by mouth daily.    aspirin EC 81 MG tablet Take 81 mg by mouth daily.    atorvastatin (LIPITOR) 10 MG tablet Take 10 mg by mouth daily.    folic acid (FOLVITE) 1 MG tablet Take 1 mg by mouth daily.  furosemide (LASIX) 40 MG tablet Take 40 mg by mouth daily as needed for fluid.     metoprolol (LOPRESSOR) 100 MG tablet Take 100 mg by mouth 2 (two) times daily.    pantoprazole (PROTONIX) 40 MG tablet Take 1 tablet (40 mg total) by mouth daily at 6 (six) AM. Qty: 30 tablet, Refills: 0    prednisoLONE acetate (PRED FORTE) 1 % ophthalmic suspension PLACE ONE DROP INTO THE RIGHT EYE TWICE DAILY Refills: 1    predniSONE (DELTASONE) 10 MG tablet TAKE ONE TABLET BY MOUTH ONCE DAILY FOR 7 DAYS STARTING ON 02/22/2017 Refills: 0    RESTASIS MULTIDOSE 0.05 % ophthalmic emulsion PLACE ONE DROP IN BOTH EYES TWICE DAILY Refills: 4    verapamil (CALAN-SR) 120 MG CR tablet Take 1 tablet (120 mg total) by mouth daily. Qty: 30 tablet, Refills: 0         Aspirin prescribed at discharge?  Yes High Intensity Statin Prescribed? (Lipitor 40-80mg  or Crestor 20-40mg ): No: LDL at goal at 56. On lipitor 10 mg.  Beta Blocker Prescribed? Yes For EF <40%, was ACEI/ARB Prescribed? Yes ADP Receptor Inhibitor Prescribed? (i.e. Plavix etc.-Includes Medically Managed Patients): Yes For EF <40%, Aldosterone Inhibitor Prescribed? Yes Was EF assessed during THIS hospitalization? Yes Was Cardiac Rehab II  ordered? (Included Medically managed Patients): Yes   Outstanding Labs/Studies   F/u CBC at time of hospital f/u.   Duration of Discharge Encounter   Greater than 30 minutes including physician time.  Signed, Robbie Lis  03/04/2017, 11:17 AM PA-C  Hillis Range MD, [facility] 03/04/2017 10:44 PM

## 2017-03-06 ENCOUNTER — Encounter (HOSPITAL_COMMUNITY): Payer: Self-pay | Admitting: Cardiology

## 2017-03-20 ENCOUNTER — Encounter: Payer: Self-pay | Admitting: Cardiology

## 2017-03-20 NOTE — Progress Notes (Signed)
.    Cardiology Office Note  Date: 03/21/2017   ID: Dana [name], DOB 15-Jan-1941, MRN [id number]  PCP: [name], MD  Primary Cardiologist: Rozann Lesches, MD   Chief Complaint  Patient presents with  . Hospitalization Follow-up    History of Present Illness: Dana [name] is a 76 y.o. female native of [place] in the [place] that I met for the first time back in March of this year, previously lived with her family in [place]. She had an interval visit in August with Ms. Vita Barley. More recently she was admitted to [facility] with chest pain and underwent cardiac catheterization revealing single vessel obstructive disease involving a large first obtuse marginal with otherwise moderate LAD stenosis, mild to moderate left main stenosis, and a small nondominant RCA. She underwent intervention with DES to the first obtuse marginal and was otherwise managed medically. Follow-up echocardiogram is outlined below.  She presents today with her son for a follow-up visit. She indicates through her son that she feels better since recent intervention, no recurring chest pain, tolerating her medications. In reviewing the list I do not see that she is on Plavix, this was prescribed at hospital discharge. Nursing is checking with her pharmacy and we are going to ensure that this medication is in place in addition to aspirin with recent DES. She has not used any nitroglycerin.  We did talk about cardiac rehabilitation, and an order has been signed for referral. I do have concerns about language barrier however, and I am not sure whether she will be able to complete this program as I doubt that an interpreter will be able to be present at each session, and I am not sure that her son will be able to be there either. I encouraged them to at least meet for the initial consultation and then decide from there.  Past Medical History:  Diagnosis Date  . Arthritis   . CAD (coronary artery  disease)    DES to OM1 02/2017  . Essential hypertension   . Gout   . HOCM (hypertrophic obstructive cardiomyopathy) ([place])   . Hyperlipidemia     Past Surgical History:  Procedure Laterality Date  . CORONARY BALLOON ANGIOPLASTY N/A 03/03/2017   Procedure: CORONARY BALLOON ANGIOPLASTY;  Surgeon: Leonie Man, MD;  Location: [facility];  Service: Cardiovascular;  Laterality: N/A;  . CORONARY STENT INTERVENTION N/A 03/03/2017   Procedure: CORONARY STENT INTERVENTION;  Surgeon: Leonie Man, MD;  Location: [facility];  Service: Cardiovascular;  Laterality: N/A;  . LEFT HEART CATH AND CORONARY ANGIOGRAPHY N/A 03/02/2017   Procedure: LEFT HEART CATH AND CORONARY ANGIOGRAPHY;  Surgeon: Sherren Mocha, MD;  Location: [facility];  Service: Cardiovascular;  Laterality: N/A;    Current Outpatient Prescriptions  Medication Sig Dispense Refill  . allopurinol (ZYLOPRIM) 300 MG tablet Take 300 mg by mouth daily.    [name] aspirin EC 81 MG tablet Take 81 mg by mouth daily.    [name] atorvastatin (LIPITOR) 10 MG tablet Take 10 mg by mouth daily.    . clopidogrel (PLAVIX) 75 MG tablet Take 75 mg by mouth daily.    [name] ezetimibe (ZETIA) 10 MG tablet Take 10 mg by mouth daily.    . folic acid (FOLVITE) 1 MG tablet Take 1 mg by mouth daily.    . furosemide (LASIX) 40 MG tablet Take 40 mg by mouth daily as needed for fluid.     . methotrexate  2.5 MG tablet Take 2.5 mg by mouth daily.    . metoprolol (LOPRESSOR) 100 MG tablet Take 100 mg by mouth 2 (two) times daily.    . nitroGLYCERIN (NITROSTAT) 0.4 MG SL tablet Place 1 tablet (0.4 mg total) under the tongue every 5 (five) minutes as needed for chest pain. 25 tablet 2  . pantoprazole (PROTONIX) 40 MG tablet Take 1 tablet (40 mg total) by mouth daily at 6 (six) AM. 30 tablet 0  . prednisoLONE acetate (PRED FORTE) 1 % ophthalmic suspension PLACE ONE DROP INTO THE RIGHT EYE TWICE DAILY  1  . RESTASIS MULTIDOSE 0.05 % ophthalmic emulsion PLACE  ONE DROP IN BOTH EYES TWICE DAILY  4  . verapamil (CALAN-SR) 120 MG CR tablet Take 1 tablet (120 mg total) by mouth daily. 30 tablet 0   No current facility-administered medications for this visit.    Allergies:  Patient has no known allergies.   Social History: The patient  reports that she has never smoked. She has never used smokeless tobacco. She reports that she does not drink alcohol or use drugs.   ROS:  Please see the history of present illness. Otherwise, complete review of systems is positive for none.  All other systems are reviewed and negative.   Physical Exam: VS:  BP 120/74   Pulse (!) 59   Ht 5' 2"  (1.575 m)   Wt 170 lb (77.1 kg)   SpO2 95%   BMI 31.09 kg/m , BMI Body mass index is 31.09 kg/m.  Wt Readings from Last 3 Encounters:  03/21/17 170 lb (77.1 kg)  03/04/17 174 lb 2.6 oz (79 kg)  02/15/17 172 lb 12.8 oz (78.4 kg)    General: Obese woman, appears comfortable at rest. HEENT: Conjunctiva and lids normal, oropharynx clear. Neck: Supple, no elevated JVP or carotid bruits, no thyromegaly. Lungs: Clear to auscultation, nonlabored breathing at rest. Cardiac: Regular rate and rhythm, no S3, 3/6 systolic murmur, no pericardial rub. Abdomen: Soft, nontender, bowel sounds present, no guarding or rebound. Extremities: No pitting edema, distal pulses 2+. Skin: Warm and dry. Musculoskeletal: No kyphosis. Neuropsychiatric: Alert and oriented x3, affect grossly appropriate.  ECG: I personally reviewed the tracing from 03/04/2017 which showed sinus rhythm with prolonged PR interval, LVH and repolarization abnormalities.  Recent Labwork: 01/29/2017: B Natriuretic Peptide 372.0; Magnesium 2.1; TSH 1.045 03/02/2017: ALT 11; AST 22 03/04/2017: BUN 17; Creatinine, Ser 1.02; Hemoglobin 9.9; Platelets 159; Potassium 3.9; Sodium 139     Component Value Date/Time   CHOL 112 01/30/2017 0427   TRIG 74 01/30/2017 0427   HDL 41 01/30/2017 0427   CHOLHDL 2.7 01/30/2017 0427   VLDL  15 01/30/2017 0427   LDLCALC 56 01/30/2017 0427    Other Studies Reviewed Today:  Echocardiogram 01/30/2017: Study Conclusions  - Left ventricle: Findings consistant with HOCM> SAM LVOT gradient   hard to differentiate from MR signal but likely velocity is   between 2-57msec. Severe basal septal hypertrophy. Systolic   function was vigorous. The estimated ejection fraction was in the   range of 65% to 70%. - Left atrium: The atrium was mildly dilated. - Atrial septum: No defect or patent foramen ovale was identified. - Pulmonary arteries: PA peak pressure: 39 mm Hg (S).  Cardiac catheterization 03/02/2017: 1. Severe single-vessel coronary artery disease involving a large first obtuse marginal branch of the left dominant circumflex 2. Moderate LAD stenosis 3. Small nondominant RCA with no significant obstruction 4. Mild to moderate left main stenosis  5. Known hypertrophic cardiomyopathy  Recommend PCI of the obtuse marginal. 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 The patient will be loaded with Plavix. Will schedule her PCI for tomorrow. Would use femoral artery access and consider either a 5 [place] guide catheter or a guide catheter with sideholes. Plan discussed at length with the patient's daughter. Note the patient is non-English speaking.   Coronary PCI 03/03/2017:  Ost LM lesion, 40 %stenosed.  1st Mrg lesion, 90 %stenosed.  A STENT SIERRA 3.00 X 12 MM drug eluting stent was successfully placed.  Post intervention, there is a 0% residual stenosis.  Lat 1st Mrg lesion, 60 %stenosed. Bifurcation lesion jailed by the initial stent  Post intervention - rescue PTCA only, there is a 20% residual stenosis.   Successful bifurcation PCI of large OM1 with major branch of OM1 PTCA. A Xience Esmond DES stent was used for the main branch and then PTCA was performed through the stent into the side branch for ostial  stenosis.  Assessment and Plan:  1. Recently diagnosed CAD status post DES intervention to the first obtuse marginal, otherwise with mild to moderate residual disease that is being managed medically. She reports improvement in chest pain. Plan is to continue medical therapy, as noted above we are ensuring that she is actually taking Plavix along with her aspirin. Referral made to cardiac rehabilitation, but as I noted, there are concerns about language barrier inhibiting this.  2. Essential hypertension, blood pressure is well controlled today.  3. Hypertrophic obstructive cardiomyopathy. Recent echocardiogram shows vigorous LVEF with LVOT gradient, velocity between 2 and 3 m/s. No syncope. Continue verapamil and metoprolol for heart rate control.  4. Hyperlipidemia, on Lipitor. Recent LDL 56.  Current medicines were reviewed with the patient today.  Disposition: Follow-up in 3 months.  Signed, Satira Sark, MD, [facility] 03/21/2017 9:28 AM    [facility]. [address] [place], [place] [address] Phone: [phone]; Fax: [phone]

## 2017-03-21 ENCOUNTER — Ambulatory Visit (INDEPENDENT_AMBULATORY_CARE_PROVIDER_SITE_OTHER): Payer: Medicare Other | Admitting: Cardiology

## 2017-03-21 ENCOUNTER — Encounter: Payer: Self-pay | Admitting: Cardiology

## 2017-03-21 VITALS — BP 120/74 | HR 59 | Ht 62.0 in | Wt 170.0 lb

## 2017-03-21 DIAGNOSIS — I25119 Atherosclerotic heart disease of native coronary artery with unspecified angina pectoris: Secondary | ICD-10-CM

## 2017-03-21 DIAGNOSIS — I10 Essential (primary) hypertension: Secondary | ICD-10-CM | POA: Diagnosis not present

## 2017-03-21 DIAGNOSIS — I209 Angina pectoris, unspecified: Secondary | ICD-10-CM | POA: Diagnosis not present

## 2017-03-21 DIAGNOSIS — E782 Mixed hyperlipidemia: Secondary | ICD-10-CM | POA: Diagnosis not present

## 2017-03-21 DIAGNOSIS — I422 Other hypertrophic cardiomyopathy: Secondary | ICD-10-CM | POA: Diagnosis not present

## 2017-03-21 NOTE — Patient Instructions (Signed)
Your physician recommends that you schedule a follow-up appointment in:  3 months with Dr.McDowell     Please take Plavix 75 mg daily     All other medications stay the same      No lab work or tests ordered today       Thank you for choosing  [facility] !

## 2017-04-07 ENCOUNTER — Ambulatory Visit: Payer: Medicare Other | Admitting: Cardiology

## 2017-04-28 ENCOUNTER — Other Ambulatory Visit (HOSPITAL_COMMUNITY): Payer: Self-pay | Admitting: Family Medicine

## 2017-04-28 DIAGNOSIS — Z780 Asymptomatic menopausal state: Secondary | ICD-10-CM

## 2017-05-01 ENCOUNTER — Encounter (HOSPITAL_COMMUNITY): Payer: Medicare Other

## 2017-06-12 ENCOUNTER — Encounter (HOSPITAL_COMMUNITY): Payer: Self-pay

## 2017-06-12 ENCOUNTER — Inpatient Hospital Stay (HOSPITAL_COMMUNITY)
Admission: RE | Admit: 2017-06-12 | Discharge: 2017-06-12 | Disposition: A | Discharge status: Home / Self Care | Payer: Medicare Other | Source: Ambulatory Visit | Attending: Family Medicine | Admitting: Family Medicine

## 2017-06-13 ENCOUNTER — Encounter (INDEPENDENT_AMBULATORY_CARE_PROVIDER_SITE_OTHER): Payer: Medicare Other | Admitting: Ophthalmology

## 2017-06-24 ENCOUNTER — Encounter (HOSPITAL_COMMUNITY): Payer: Self-pay | Admitting: Emergency Medicine

## 2017-06-24 ENCOUNTER — Other Ambulatory Visit: Payer: Self-pay

## 2017-06-24 ENCOUNTER — Emergency Department (HOSPITAL_COMMUNITY)
Admission: EM | Admit: 2017-06-24 | Discharge: 2017-06-24 | Disposition: A | Discharge status: Home / Self Care | Payer: Medicare Other | Attending: Emergency Medicine | Admitting: Emergency Medicine

## 2017-06-24 DIAGNOSIS — R600 Localized edema: Secondary | ICD-10-CM | POA: Insufficient documentation

## 2017-06-24 DIAGNOSIS — I10 Essential (primary) hypertension: Secondary | ICD-10-CM | POA: Diagnosis not present

## 2017-06-24 DIAGNOSIS — I2510 Atherosclerotic heart disease of native coronary artery without angina pectoris: Secondary | ICD-10-CM | POA: Diagnosis not present

## 2017-06-24 DIAGNOSIS — Z79899 Other long term (current) drug therapy: Secondary | ICD-10-CM | POA: Diagnosis not present

## 2017-06-24 DIAGNOSIS — M25562 Pain in left knee: Secondary | ICD-10-CM | POA: Diagnosis present

## 2017-06-24 DIAGNOSIS — M25561 Pain in right knee: Secondary | ICD-10-CM | POA: Diagnosis not present

## 2017-06-24 DIAGNOSIS — Z7982 Long term (current) use of aspirin: Secondary | ICD-10-CM | POA: Diagnosis not present

## 2017-06-24 DIAGNOSIS — Z7901 Long term (current) use of anticoagulants: Secondary | ICD-10-CM | POA: Insufficient documentation

## 2017-06-24 DIAGNOSIS — E785 Hyperlipidemia, unspecified: Secondary | ICD-10-CM | POA: Insufficient documentation

## 2017-06-24 MED ORDER — PREDNISONE 20 MG PO TABS: 20.0000 mg | ORAL_TABLET | Freq: Every day | ORAL | 0 refills | Status: DC

## 2017-06-24 NOTE — ED Provider Notes (Signed)
[facility] EMERGENCY DEPARTMENT Provider Note   CSN: [id number] Arrival date & time: 06/24/17  1313     History   Chief Complaint Chief Complaint  Patient presents with  . Knee Pain    HPI Dana [name] is a 76 y.o. female.  HPI Patient with bilateral knee pain.  Has had recent flights from [place] to [place] to [place] and back to the [place].  Now increasing pain in both of her knees.  Worse with movements.  Slight swelling.  No chest pain or difficulty breathing.  Does have a history of gout.  No redness or swelling of the knees.  No fevers. Past Medical History:  Diagnosis Date  . Arthritis   . CAD (coronary artery disease)    DES to OM1 02/2017  . Essential hypertension   . Gout   . HOCM (hypertrophic obstructive cardiomyopathy) (HCC)   . Hyperlipidemia     Patient Active Problem List   Diagnosis Date Noted  . Unstable angina (HCC)   . Coronary artery disease involving native coronary artery of native heart with unstable angina pectoris (HCC)   . Chest pain 01/29/2017  . Hypertrophic cardiomyopathy (HCC) 01/29/2017  . MR (mitral regurgitation) 01/29/2017  . HTN (hypertension) 01/29/2017  . Hyperlipidemia 01/29/2017  . Gout 01/29/2017  . Arthritis 01/29/2017  . Anemia 01/29/2017    Past Surgical History:  Procedure Laterality Date  . CORONARY BALLOON ANGIOPLASTY N/A 03/03/2017   Procedure: CORONARY BALLOON ANGIOPLASTY;  Surgeon: Marykay Lex, MD;  Location: [facility] INVASIVE CV LAB;  Service: Cardiovascular;  Laterality: N/A;  . CORONARY STENT INTERVENTION N/A 03/03/2017   Procedure: CORONARY STENT INTERVENTION;  Surgeon: Marykay Lex, MD;  Location: [facility] INVASIVE CV LAB;  Service: Cardiovascular;  Laterality: N/A;  . LEFT HEART CATH AND CORONARY ANGIOGRAPHY N/A 03/02/2017   Procedure: LEFT HEART CATH AND CORONARY ANGIOGRAPHY;  Surgeon: Tonny Bollman, MD;  Location: [facility] INVASIVE CV LAB;  Service: Cardiovascular;  Laterality: N/A;    OB History    Gravida Para Term  Preterm AB Living   7         7   SAB TAB Ectopic Multiple Live Births                   Home Medications    Prior to Admission medications   Medication Sig Start Date End Date Taking? Authorizing Provider  allopurinol (ZYLOPRIM) 300 MG tablet Take 300 mg by mouth daily.   Yes [provider]  aspirin EC 81 MG tablet Take 81 mg by mouth daily.   Yes [provider]  atorvastatin (LIPITOR) 10 MG tablet Take 10 mg by mouth daily.   Yes [provider]  clopidogrel (PLAVIX) 75 MG tablet Take 75 mg by mouth daily.   Yes [provider]  ezetimibe (ZETIA) 10 MG tablet Take 10 mg by mouth daily.   Yes [provider]  folic acid (FOLVITE) 1 MG tablet Take 1 mg by mouth daily.   Yes [provider]  furosemide (LASIX) 40 MG tablet Take 40 mg by mouth daily as needed for fluid.    Yes [provider]  methotrexate 2.5 MG tablet Take 2.5 mg by mouth daily.   Yes [provider]  metoprolol (LOPRESSOR) 100 MG tablet Take 100 mg by mouth 2 (two) times daily.   Yes [provider]  nitroGLYCERIN (NITROSTAT) 0.4 MG SL tablet Place 1 tablet (0.4 mg total) under the tongue every 5 (five)  minutes as needed for chest pain. 03/04/17  Yes Sharol Harness, Brittainy M, PA-C  pantoprazole (PROTONIX) 40 MG tablet Take 1 tablet (40 mg total) by mouth daily at 6 (six) AM. 01/31/17  Yes Rodolph Bong, MD  prednisoLONE acetate (PRED FORTE) 1 % ophthalmic suspension PLACE ONE DROP INTO THE RIGHT EYE TWICE DAILY 01/20/17  Yes [provider]  RESTASIS MULTIDOSE 0.05 % ophthalmic emulsion PLACE ONE DROP IN BOTH EYES TWICE DAILY 12/26/16  Yes [provider]  verapamil (CALAN-SR) 120 MG CR tablet Take 1 tablet (120 mg total) by mouth daily. 01/30/17  Yes Rodolph Bong, MD  predniSONE (DELTASONE) 20 MG tablet Take 1 tablet (20 mg total) by mouth daily. 06/24/17   Benjiman Core, MD    Family History Family History  Problem  Relation Age of Onset  . Hypertension Mother   . Liver disease Mother   . Alcohol abuse Father     Social History Social History   Tobacco Use  . Smoking status: Never Smoker  . Smokeless tobacco: Never Used  Substance Use Topics  . Alcohol use: No  . Drug use: No     Allergies   Patient has no known allergies.   Review of Systems Review of Systems  Constitutional: Negative for appetite change.  HENT: Negative for congestion.   Respiratory: Negative for shortness of breath.   Cardiovascular: Positive for leg swelling.  Gastrointestinal: Negative for abdominal pain.  Genitourinary: Negative for enuresis.  Musculoskeletal: Negative for back pain.       Bilateral knee pain with swelling behind them.  Skin: Negative for wound.  Neurological: Negative for seizures.  Hematological: Negative for adenopathy.  Psychiatric/Behavioral: Negative for confusion.     Physical Exam Updated Vital Signs BP (!) 129/46   Pulse 84   Temp 98.3 F (36.8 C) (Oral)   Resp 18   Ht 5\' 2"  (1.575 m)   Wt 81.6 kg (180 lb)   SpO2 99%   BMI 32.92 kg/m   Physical Exam  Constitutional: She appears well-developed.  HENT:  Head: Atraumatic.  Cardiovascular: Normal rate.  Pulmonary/Chest: Effort normal.  Abdominal: There is no tenderness.  Musculoskeletal:  Some pain posteriorly to the left knee.  No large knee effusion.  No erythema.  Knee is not irritable.  Some edema of left lower leg.  Some tenderness also behind right knee.  Not as much of a fullness as on the left side.  No large knee effusion anteriorly.  Good range of motion.  Some edema also on right lower leg.  Skin: Skin is warm. Capillary refill takes less than 2 seconds.     ED Treatments / Results  Labs (all labs ordered are listed, but only abnormal results are displayed) Labs Reviewed - No data to display  EKG  EKG Interpretation None       Radiology No results found.  Procedures Procedures (including  critical care time)  Medications Ordered in ED Medications - No data to display   Initial Impression / Assessment and Plan / ED Course  I have reviewed the triage vital signs and the nursing notes.  Pertinent labs & imaging results that were available during my care of the patient were reviewed by me and considered in my medical decision making (see chart for details).     Patient with pain in both her knees.  May be related to sitting in the plane for so many hours recently but with the mobility DVT is considered.  Outpatient Doppler scheduled.  However patient also has a history of gout.  Could be a mild flare.  No erythema and not irritable but short course of steroids given.  Discharge home for Doppler tomorrow and PCP follow-up as needed.  Final Clinical Impressions(s) / ED Diagnoses   Final diagnoses:  Acute pain of both knees    ED Discharge Orders        Ordered    US Venous Img Lower Bilateral     06/24/17 1853    predniSONE (DELTASONE) 20 MG tablet  Daily     06/24/17 1927       Benjiman Core, MD 06/25/17 0010

## 2017-06-24 NOTE — ED Triage Notes (Signed)
Patient complain of bilateral knee pain. Patient just flew from [place] and states knee pain has increased.

## 2017-06-24 NOTE — Discharge Instructions (Signed)
Follow-up tomorrow for the ultrasound.  Follow-up with your doctor for further management of the knee pain.  Take the steroids for the next few days to help.

## 2017-06-25 ENCOUNTER — Inpatient Hospital Stay (HOSPITAL_COMMUNITY): Admit: 2017-06-25 | Payer: Medicare Other

## 2017-06-25 ENCOUNTER — Ambulatory Visit (HOSPITAL_COMMUNITY)
Admission: RE | Admit: 2017-06-25 | Discharge: 2017-06-25 | Disposition: A | Discharge status: Home / Self Care | Payer: Medicare Other | Source: Ambulatory Visit | Attending: Emergency Medicine | Admitting: Emergency Medicine

## 2017-06-25 DIAGNOSIS — M25569 Pain in unspecified knee: Secondary | ICD-10-CM | POA: Insufficient documentation

## 2017-06-25 NOTE — ED Provider Notes (Signed)
Bilateral venous Doppler studies of the lower extremities were negative.  This was discussed with the patient and her son.   Donnetta Hutching, MD 06/25/17 [phone]

## 2017-07-12 NOTE — Progress Notes (Signed)
Cardiology Office Note  Date: 07/14/2017   ID: Dana [name], DOB 05-Jul-1940, MRN [id number]  PCP: Dana Grippe, MD  Primary Cardiologist: Dana Dell, MD   Chief Complaint  Patient presents with  . Coronary Artery Disease  . Cardiomyopathy    History of Present Illness: Dana [name] is a 77 y.o. female native of [place] in the [place], last seen in September 2018. She is here today with her son. Overall he states that she has been doing well. She has had one episode of brief chest discomfort since last assessment, took one nitroglycerin pill. She has traveled to [place] and on to [place] in December 2018.  We went over her medications. Cardiac regimen is stable and she has good blood pressure and heart rate control noted today. She reports no bleeding problems on aspirin and Plavix.  Cardiac studies from August and September 2018 are outlined below.  Past Medical History:  Diagnosis Date  . Arthritis   . CAD (coronary artery disease)    DES to OM1 02/2017  . Essential hypertension   . Gout   . HOCM (hypertrophic obstructive cardiomyopathy) (HCC)   . Hyperlipidemia     Past Surgical History:  Procedure Laterality Date  . CORONARY BALLOON ANGIOPLASTY N/A 03/03/2017   Procedure: CORONARY BALLOON ANGIOPLASTY;  Surgeon: Dana Lex, MD;  Location: [facility] INVASIVE CV LAB;  Service: Cardiovascular;  Laterality: N/A;  . CORONARY STENT INTERVENTION N/A 03/03/2017   Procedure: CORONARY STENT INTERVENTION;  Surgeon: Dana Lex, MD;  Location: [facility] INVASIVE CV LAB;  Service: Cardiovascular;  Laterality: N/A;  . LEFT HEART CATH AND CORONARY ANGIOGRAPHY N/A 03/02/2017   Procedure: LEFT HEART CATH AND CORONARY ANGIOGRAPHY;  Surgeon: Dana Bollman, MD;  Location: [facility] INVASIVE CV LAB;  Service: Cardiovascular;  Laterality: N/A;    Current Outpatient Medications  Medication Sig Dispense Refill  . allopurinol (ZYLOPRIM) 300 MG tablet Take 300 mg by mouth daily.    [name] aspirin EC 81 MG  tablet Take 81 mg by mouth daily.    [name] atorvastatin (LIPITOR) 10 MG tablet Take 10 mg by mouth daily.    . clopidogrel (PLAVIX) 75 MG tablet Take 75 mg by mouth daily.    [name] ezetimibe (ZETIA) 10 MG tablet Take 10 mg by mouth daily.    . folic acid (FOLVITE) 1 MG tablet Take 1 mg by mouth daily.    . furosemide (LASIX) 40 MG tablet Take 40 mg by mouth daily as needed for fluid.     . methotrexate 2.5 MG tablet Take 2.5 mg by mouth daily.    . metoprolol (LOPRESSOR) 100 MG tablet Take 100 mg by mouth 2 (two) times daily.    . nitroGLYCERIN (NITROSTAT) 0.4 MG SL tablet Place 1 tablet (0.4 mg total) under the tongue every 5 (five) minutes as needed for chest pain. 25 tablet 2  . pantoprazole (PROTONIX) 40 MG tablet Take 1 tablet (40 mg total) by mouth daily at 6 (six) AM. 30 tablet 0  . prednisoLONE acetate (PRED FORTE) 1 % ophthalmic suspension PLACE ONE DROP INTO THE RIGHT EYE TWICE DAILY  1  . predniSONE (DELTASONE) 20 MG tablet Take 1 tablet (20 mg total) by mouth daily. 5 tablet 0  . RESTASIS MULTIDOSE 0.05 % ophthalmic emulsion PLACE ONE DROP IN BOTH EYES TWICE DAILY  4  . verapamil (CALAN-SR) 120 MG CR tablet Take 1 tablet (120 mg total) by mouth daily. 30 tablet 0   No current facility-administered medications  for this visit.    Allergies:  Patient has no known allergies.   Social History: The patient  reports that  has never smoked. she has never used smokeless tobacco. She reports that she does not drink alcohol or use drugs.   ROS:  Please see the history of present illness. Otherwise, complete review of systems is positive for arthritic symptoms in her feet.  All other systems are reviewed and negative.   Physical Exam: VS:  BP 100/64   Pulse (!) 55   Ht 5\' 2"  (1.575 m)   Wt 169 lb (76.7 kg)   SpO2 98%   BMI 30.91 kg/m , BMI Body mass index is 30.91 kg/m.  Wt Readings from Last 3 Encounters:  07/14/17 169 lb (76.7 kg)  06/24/17 180 lb (81.6 kg)  03/21/17 170 lb (77.1 kg)     General: Patient appears comfortable at rest. HEENT: Conjunctiva and lids normal, oropharynx clear. Neck: Supple, no elevated JVP or carotid bruits, no thyromegaly. Lungs: Clear to auscultation, nonlabored breathing at rest. Cardiac: Regular rate and rhythm, no S3, 2/6 systolic murmur, no pericardial rub. Abdomen: Soft, nontender, bowel sounds present. Extremities: No pitting edema, distal pulses 2+. Skin: Warm and dry. Musculoskeletal: No kyphosis. Neuropsychiatric: Alert and oriented x3, affect grossly appropriate.  ECG: I personally reviewed the tracing from 03/04/2017 which showed sinus rhythm with prolonged PR interval, LVH and significant repolarization abnormalities.  Recent Labwork: 01/29/2017: B Natriuretic Peptide 372.0; Magnesium 2.1; TSH 1.045 03/02/2017: ALT 11; AST 22 03/04/2017: BUN 17; Creatinine, Ser 1.02; Hemoglobin 9.9; Platelets 159; Potassium 3.9; Sodium 139     Component Value Date/Time   CHOL 112 01/30/2017 0427   TRIG 74 01/30/2017 0427   HDL 41 01/30/2017 0427   CHOLHDL 2.7 01/30/2017 0427   VLDL 15 01/30/2017 0427   LDLCALC 56 01/30/2017 0427    Other Studies Reviewed Today:  Echocardiogram 01/30/2017: Study Conclusions  - Left ventricle: Findings consistant with HOCM> SAM LVOT gradient hard to differentiate from MR signal but likely velocity is between 2-62m/sec. Severe basal septal hypertrophy. Systolic function was vigorous. The estimated ejection fraction was in the range of 65% to 70%. - Left atrium: The atrium was mildly dilated. - Atrial septum: No defect or patent foramen ovale was identified. - Pulmonary arteries: PA peak pressure: 39 mm Hg (S).  Cardiac catheterization 03/02/2017: 1. Severe single-vessel coronary artery disease involving a large first obtuse marginal branch of the left dominant circumflex 2. Moderate LAD stenosis 3. Small nondominant RCA with no significant obstruction 4. Mild to moderate left main stenosis 5. Known  hypertrophic cardiomyopathy  Recommend PCI of the obtuse marginal. 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 The patient will be loaded with Plavix. Will schedule her PCI for tomorrow. Would use femoral artery access and consider either a 5 [place] guide catheter or a guide catheter with sideholes. Plan discussed at length with the patient's daughter. Note the patient is non-English speaking.   Coronary PCI 03/03/2017:  Ost LM lesion, 40 %stenosed.  1st Mrg lesion, 90 %stenosed.  A STENT SIERRA 3.00 X 12 MM drug eluting stent was successfully placed.  Post intervention, there is a 0% residual stenosis.  Lat 1st Mrg lesion, 60 %stenosed. Bifurcation lesion jailed by the initial stent  Post intervention - rescue PTCA only, there is a 20% residual stenosis.  Successful bifurcation PCI of large OM1 with major branch of OM1 PTCA. A Xience Sierra DES  stent was used for the main branch and then PTCA was performed through the stent into the side branch for ostial stenosis.  Assessment and Plan:  1. CAD status post DES to the first obtuse marginal with mild to moderate residual disease that has been managed medically. Plan is to continue with current regimen and observation for now including dual antiplatelet therapy.  2. Essential hypertension, blood pressure is well controlled today.  3. Hypertrophic obstructive cardiomyopathy. Echocardiogram from within the last 6 months is outlined above. She is symptomatically stable and on both verapamil as well as metoprolol with good heart rate control.  4. Mixed hyperlipidemia, continues on Lipitor.  Current medicines were reviewed with the patient today.  Disposition: Follow-up in 6 months.  Signed, Jonelle Sidle, MD, [facility] 07/14/2017 9:56 AM    [facility] at [facility] [address] [place], [place], [place] [address] Phone: [phone]; Fax: [phone]

## 2017-07-14 ENCOUNTER — Ambulatory Visit (INDEPENDENT_AMBULATORY_CARE_PROVIDER_SITE_OTHER): Payer: Medicare Other | Admitting: Cardiology

## 2017-07-14 ENCOUNTER — Encounter: Payer: Self-pay | Admitting: Cardiology

## 2017-07-14 VITALS — BP 100/64 | HR 55 | Ht 62.0 in | Wt 169.0 lb

## 2017-07-14 DIAGNOSIS — I209 Angina pectoris, unspecified: Secondary | ICD-10-CM

## 2017-07-14 DIAGNOSIS — I10 Essential (primary) hypertension: Secondary | ICD-10-CM | POA: Diagnosis not present

## 2017-07-14 DIAGNOSIS — I422 Other hypertrophic cardiomyopathy: Secondary | ICD-10-CM | POA: Diagnosis not present

## 2017-07-14 DIAGNOSIS — I25119 Atherosclerotic heart disease of native coronary artery with unspecified angina pectoris: Secondary | ICD-10-CM

## 2017-07-14 DIAGNOSIS — E782 Mixed hyperlipidemia: Secondary | ICD-10-CM

## 2017-07-14 NOTE — Patient Instructions (Signed)

## 2017-07-24 ENCOUNTER — Encounter (INDEPENDENT_AMBULATORY_CARE_PROVIDER_SITE_OTHER): Payer: Medicare Other | Admitting: Ophthalmology

## 2017-07-27 ENCOUNTER — Encounter (INDEPENDENT_AMBULATORY_CARE_PROVIDER_SITE_OTHER): Payer: Medicare Other | Admitting: Ophthalmology

## 2017-07-27 DIAGNOSIS — H43813 Vitreous degeneration, bilateral: Secondary | ICD-10-CM

## 2017-07-27 DIAGNOSIS — H35372 Puckering of macula, left eye: Secondary | ICD-10-CM | POA: Diagnosis not present

## 2017-07-27 DIAGNOSIS — I10 Essential (primary) hypertension: Secondary | ICD-10-CM

## 2017-07-27 DIAGNOSIS — H33302 Unspecified retinal break, left eye: Secondary | ICD-10-CM

## 2017-07-27 DIAGNOSIS — H35033 Hypertensive retinopathy, bilateral: Secondary | ICD-10-CM

## 2017-09-16 ENCOUNTER — Observation Stay (HOSPITAL_COMMUNITY)
Admission: EM | Admit: 2017-09-16 | Discharge: 2017-09-18 | Disposition: A | Discharge status: Home / Self Care | Payer: Medicare Other | Attending: Internal Medicine | Admitting: Internal Medicine

## 2017-09-16 ENCOUNTER — Emergency Department (HOSPITAL_COMMUNITY): Payer: Medicare Other

## 2017-09-16 DIAGNOSIS — M109 Gout, unspecified: Secondary | ICD-10-CM | POA: Diagnosis present

## 2017-09-16 DIAGNOSIS — Z79899 Other long term (current) drug therapy: Secondary | ICD-10-CM | POA: Diagnosis not present

## 2017-09-16 DIAGNOSIS — E782 Mixed hyperlipidemia: Secondary | ICD-10-CM | POA: Diagnosis present

## 2017-09-16 DIAGNOSIS — I421 Obstructive hypertrophic cardiomyopathy: Secondary | ICD-10-CM | POA: Diagnosis not present

## 2017-09-16 DIAGNOSIS — M069 Rheumatoid arthritis, unspecified: Secondary | ICD-10-CM | POA: Insufficient documentation

## 2017-09-16 DIAGNOSIS — R079 Chest pain, unspecified: Secondary | ICD-10-CM | POA: Diagnosis present

## 2017-09-16 DIAGNOSIS — Z7902 Long term (current) use of antithrombotics/antiplatelets: Secondary | ICD-10-CM | POA: Insufficient documentation

## 2017-09-16 DIAGNOSIS — D649 Anemia, unspecified: Secondary | ICD-10-CM | POA: Diagnosis not present

## 2017-09-16 DIAGNOSIS — Z7952 Long term (current) use of systemic steroids: Secondary | ICD-10-CM | POA: Insufficient documentation

## 2017-09-16 DIAGNOSIS — I10 Essential (primary) hypertension: Secondary | ICD-10-CM | POA: Diagnosis not present

## 2017-09-16 DIAGNOSIS — Z955 Presence of coronary angioplasty implant and graft: Secondary | ICD-10-CM | POA: Diagnosis not present

## 2017-09-16 DIAGNOSIS — M1990 Unspecified osteoarthritis, unspecified site: Secondary | ICD-10-CM | POA: Diagnosis present

## 2017-09-16 DIAGNOSIS — Z7982 Long term (current) use of aspirin: Secondary | ICD-10-CM | POA: Diagnosis not present

## 2017-09-16 DIAGNOSIS — E785 Hyperlipidemia, unspecified: Secondary | ICD-10-CM | POA: Insufficient documentation

## 2017-09-16 DIAGNOSIS — I2510 Atherosclerotic heart disease of native coronary artery without angina pectoris: Secondary | ICD-10-CM | POA: Diagnosis present

## 2017-09-16 LAB — CBC WITH DIFFERENTIAL/PLATELET
BASOS ABS: 0 10*3/uL (ref 0.0–0.1)
BASOS PCT: 0 %
EOS ABS: 0.1 10*3/uL (ref 0.0–0.7)
Eosinophils Relative: 1 %
HCT: 31.3 % — ABNORMAL LOW (ref 36.0–46.0)
HEMOGLOBIN: 10.2 g/dL — AB (ref 12.0–15.0)
Lymphocytes Relative: 15 %
Lymphs Abs: 1.1 10*3/uL (ref 0.7–4.0)
MCH: 28.3 pg (ref 26.0–34.0)
MCHC: 32.6 g/dL (ref 30.0–36.0)
MCV: 86.9 fL (ref 78.0–100.0)
Monocytes Absolute: 0.3 10*3/uL (ref 0.1–1.0)
Monocytes Relative: 4 %
NEUTROS PCT: 80 %
Neutro Abs: 5.8 10*3/uL (ref 1.7–7.7)
Platelets: 215 10*3/uL (ref 150–400)
RBC: 3.6 MIL/uL — AB (ref 3.87–5.11)
RDW: 15.7 % — ABNORMAL HIGH (ref 11.5–15.5)
WBC: 7.3 10*3/uL (ref 4.0–10.5)

## 2017-09-16 LAB — BASIC METABOLIC PANEL
Anion gap: 9 (ref 5–15)
BUN: 17 mg/dL (ref 6–20)
CALCIUM: 8.7 mg/dL — AB (ref 8.9–10.3)
CO2: 23 mmol/L (ref 22–32)
CREATININE: 0.81 mg/dL (ref 0.44–1.00)
Chloride: 106 mmol/L (ref 101–111)
Glucose, Bld: 117 mg/dL — ABNORMAL HIGH (ref 65–99)
Potassium: 3.6 mmol/L (ref 3.5–5.1)
SODIUM: 138 mmol/L (ref 135–145)

## 2017-09-16 LAB — I-STAT TROPONIN, ED: TROPONIN I, POC: 0.01 ng/mL (ref 0.00–0.08)

## 2017-09-16 NOTE — ED Provider Notes (Addendum)
[facility] EMERGENCY DEPARTMENT Provider Note   CSN: [id number] Arrival date & time: 09/16/17  2116     History   Chief Complaint Chief Complaint  Patient presents with  . Chest Pain    HPI Dana [name] is a 77 y.o. female.  77 year old female with history of coronary artery disease, drug-eluting stent to OM1 September of last year, presents to ER with chest pain.  She had pain onset while at rest earlier tonight.  Pain was in the left chest, did not radiate.  Pain felt similar to previous cardiac chest pain.  She reports shortness of breath and nausea associated with the symptoms.  She vomited one time.  Patient reports aspirin and nitroglycerin administered by EMS.  Her pain is now resolved.     Past Medical History:  Diagnosis Date  . Arthritis   . CAD (coronary artery disease)    DES to OM1 02/2017  . Essential hypertension   . Gout   . HOCM (hypertrophic obstructive cardiomyopathy) (HCC)   . Hyperlipidemia     Patient Active Problem List   Diagnosis Date Noted  . Unstable angina (HCC)   . Coronary artery disease involving native coronary artery of native heart with unstable angina pectoris (HCC)   . Chest pain 01/29/2017  . Hypertrophic cardiomyopathy (HCC) 01/29/2017  . MR (mitral regurgitation) 01/29/2017  . HTN (hypertension) 01/29/2017  . Hyperlipidemia 01/29/2017  . Gout 01/29/2017  . Arthritis 01/29/2017  . Anemia 01/29/2017    Past Surgical History:  Procedure Laterality Date  . CORONARY BALLOON ANGIOPLASTY N/A 03/03/2017   Procedure: CORONARY BALLOON ANGIOPLASTY;  Surgeon: Marykay Lex, MD;  Location: [facility] INVASIVE CV LAB;  Service: Cardiovascular;  Laterality: N/A;  . CORONARY STENT INTERVENTION N/A 03/03/2017   Procedure: CORONARY STENT INTERVENTION;  Surgeon: Marykay Lex, MD;  Location: [facility] INVASIVE CV LAB;  Service: Cardiovascular;  Laterality: N/A;  . LEFT HEART CATH AND CORONARY ANGIOGRAPHY N/A 03/02/2017   Procedure: LEFT  HEART CATH AND CORONARY ANGIOGRAPHY;  Surgeon: Tonny Bollman, MD;  Location: [facility] INVASIVE CV LAB;  Service: Cardiovascular;  Laterality: N/A;     OB History    Gravida  7   Para      Term      Preterm      AB      Living  7     SAB      TAB      Ectopic      Multiple      Live Births               Home Medications    Prior to Admission medications   Medication Sig Start Date End Date Taking? Authorizing Provider  allopurinol (ZYLOPRIM) 300 MG tablet Take 300 mg by mouth daily.    [provider]  aspirin EC 81 MG tablet Take 81 mg by mouth daily.    [provider]  atorvastatin (LIPITOR) 10 MG tablet Take 10 mg by mouth daily.    [provider]  clopidogrel (PLAVIX) 75 MG tablet Take 75 mg by mouth daily.    [provider]  ezetimibe (ZETIA) 10 MG tablet Take 10 mg by mouth daily.    [provider]  folic acid (FOLVITE) 1 MG tablet Take 1 mg by mouth daily.    [provider]  furosemide (LASIX) 40 MG tablet Take 40 mg by mouth daily as needed for fluid.     [provider]  methotrexate 2.5 MG tablet Take 2.5 mg by mouth daily.    [provider]  metoprolol (LOPRESSOR) 100 MG tablet Take 100 mg by mouth 2 (two) times daily.    [provider]  nitroGLYCERIN (NITROSTAT) 0.4 MG SL tablet Place 1 tablet (0.4 mg total) under the tongue every 5 (five) minutes as needed for chest pain. 03/04/17   Robbie Lis M, PA-C  pantoprazole (PROTONIX) 40 MG tablet Take 1 tablet (40 mg total) by mouth daily at 6 (six) AM. 01/31/17   Rodolph Bong, MD  prednisoLONE acetate (PRED FORTE) 1 % ophthalmic suspension PLACE ONE DROP INTO THE RIGHT EYE TWICE DAILY 01/20/17   [provider]  predniSONE (DELTASONE) 20 MG tablet Take 1 tablet (20 mg total) by mouth daily. 06/24/17   Benjiman Core, MD  RESTASIS MULTIDOSE 0.05 % ophthalmic emulsion PLACE ONE DROP IN BOTH EYES TWICE DAILY  12/26/16   [provider]  verapamil (CALAN-SR) 120 MG CR tablet Take 1 tablet (120 mg total) by mouth daily. 01/30/17   Rodolph Bong, MD    Family History Family History  Problem Relation Age of Onset  . Hypertension Mother   . Liver disease Mother   . Alcohol abuse Father     Social History Social History   Tobacco Use  . Smoking status: Never Smoker  . Smokeless tobacco: Never Used  Substance Use Topics  . Alcohol use: No  . Drug use: No     Allergies   Patient has no known allergies.   Review of Systems Review of Systems  Respiratory: Positive for shortness of breath.   Cardiovascular: Positive for chest pain.  Gastrointestinal: Positive for nausea and vomiting.  All other systems reviewed and are negative.    Physical Exam Updated Vital Signs BP 112/60 (BP Location: Right Arm)   Pulse 62   Resp (!) 26   SpO2 98%   Physical Exam  Constitutional: She is oriented to person, place, and time. She appears well-developed and well-nourished. No distress.  HENT:  Head: Normocephalic and atraumatic.  Right Ear: Hearing normal.  Left Ear: Hearing normal.  Nose: Nose normal.  Mouth/Throat: Oropharynx is clear and moist and mucous membranes are normal.  Eyes: Pupils are equal, round, and reactive to light. Conjunctivae and EOM are normal.  Neck: Normal range of motion. Neck supple.  Cardiovascular: Regular rhythm, S1 normal and S2 normal. Exam reveals no gallop and no friction rub.  Murmur heard.  Systolic murmur is present with a grade of 4/6. Pulmonary/Chest: Effort normal and breath sounds normal. No respiratory distress. She exhibits no tenderness.  Abdominal: Soft. Normal appearance and bowel sounds are normal. There is no hepatosplenomegaly. There is no tenderness. There is no rebound, no guarding, no tenderness at McBurney's point and negative Murphy's sign. No hernia.  Musculoskeletal: Normal range of motion.  Neurological: She is alert and  oriented to person, place, and time. She has normal strength. No cranial nerve deficit or sensory deficit. Coordination normal. GCS eye subscore is 4. GCS verbal subscore is 5. GCS motor subscore is 6.  Skin: Skin is warm, dry and intact. No rash noted. No cyanosis.  Psychiatric: She has a normal mood and affect. Her speech is normal and behavior is normal. Thought content normal.  Nursing note and vitals reviewed.    ED Treatments / Results  Labs (all labs ordered are listed, but only abnormal results are displayed) Labs Reviewed  BASIC METABOLIC PANEL - Abnormal;  Notable for the following components:      Result Value   Glucose, Bld 117 (*)    Calcium 8.7 (*)    All other components within normal limits  CBC WITH DIFFERENTIAL/PLATELET - Abnormal; Notable for the following components:   RBC 3.60 (*)    Hemoglobin 10.2 (*)    HCT 31.3 (*)    RDW 15.7 (*)    All other components within normal limits  I-STAT TROPONIN, ED    EKG EKG Interpretation  Date/Time:  Saturday September 16 2017 21:29:17 EDT Ventricular Rate:  72 PR Interval:  208 QRS Duration: 92 QT Interval:  432 QTC Calculation: 473 R Axis:   38 Text Interpretation:  Normal sinus rhythm Left ventricular hypertrophy with repolarization abnormality Cannot rule out Septal infarct , age undetermined Abnormal ECG since last tracing no significant change Confirmed by Rolan Bucco [phone]) on 09/16/2017 9:33:54 PM   Radiology Dg Chest 2 View  Result Date: 09/16/2017 CLINICAL DATA:  Left chest pain EXAM: CHEST - 2 VIEW COMPARISON:  02/28/2017 FINDINGS: The lungs are clear without focal pneumonia, edema, pneumothorax or pleural effusion. Cardiopericardial silhouette is at upper limits of normal for size. The visualized bony structures of the thorax are intact. Telemetry leads overlie the chest. IMPRESSION: No active cardiopulmonary disease. Electronically Signed   By: Kennith Center M.D.   On: 09/16/2017 22:04     Procedures Procedures (including critical care time)  Medications Ordered in ED Medications - No data to display   Initial Impression / Assessment and Plan / ED Course  I have reviewed the triage vital signs and the nursing notes.  Pertinent labs & imaging results that were available during my care of the patient were reviewed by me and considered in my medical decision making (see chart for details).     Patient with previous history of coronary artery disease, status post drug-eluting stent to OM1 September 2018 presents with chest pain similar to previous cardiac chest pain.  She is currently chest pain-free.  EKG shows LVH with associated repolarization changes, unchanged from previous.  No obvious ischemia or infarct.  Normal troponin.  Based on patient's previous history, will require observation.  Final Clinical Impressions(s) / ED Diagnoses   Final diagnoses:  Chest pain, unspecified type    ED Discharge Orders    None       Gilda Crease, MD 09/16/17 2354    Gilda Crease, MD 09/17/17 [phone]

## 2017-09-16 NOTE — ED Triage Notes (Signed)
Pt arrived via [facility] c/o chest pain that began tonight at approx 1930, per family. Pt took 324 ASA and 1 nitro prior to EMS arrival. EMS found their EKG to be abnormal which was transmitted to EDP. Pt denies N/V at time of arrival to ED. Pt has cardiac hx. EMS v/s 114/75, hr 85, 18rr, 95%ra.

## 2017-09-16 NOTE — ED Notes (Signed)
Delay in lab draw pt in xray. 

## 2017-09-17 ENCOUNTER — Encounter (HOSPITAL_COMMUNITY): Payer: Self-pay | Admitting: Internal Medicine

## 2017-09-17 ENCOUNTER — Other Ambulatory Visit: Payer: Self-pay

## 2017-09-17 DIAGNOSIS — R079 Chest pain, unspecified: Secondary | ICD-10-CM

## 2017-09-17 DIAGNOSIS — I421 Obstructive hypertrophic cardiomyopathy: Secondary | ICD-10-CM | POA: Diagnosis not present

## 2017-09-17 DIAGNOSIS — I25119 Atherosclerotic heart disease of native coronary artery with unspecified angina pectoris: Secondary | ICD-10-CM | POA: Diagnosis not present

## 2017-09-17 DIAGNOSIS — R072 Precordial pain: Secondary | ICD-10-CM | POA: Diagnosis not present

## 2017-09-17 DIAGNOSIS — E785 Hyperlipidemia, unspecified: Secondary | ICD-10-CM

## 2017-09-17 DIAGNOSIS — M1990 Unspecified osteoarthritis, unspecified site: Secondary | ICD-10-CM

## 2017-09-17 DIAGNOSIS — I159 Secondary hypertension, unspecified: Secondary | ICD-10-CM | POA: Diagnosis not present

## 2017-09-17 DIAGNOSIS — I2510 Atherosclerotic heart disease of native coronary artery without angina pectoris: Secondary | ICD-10-CM | POA: Diagnosis present

## 2017-09-17 LAB — RAPID URINE DRUG SCREEN, HOSP PERFORMED
Amphetamines: NOT DETECTED
BARBITURATES: NOT DETECTED
Benzodiazepines: NOT DETECTED
COCAINE: NOT DETECTED
OPIATES: NOT DETECTED
Tetrahydrocannabinol: NOT DETECTED

## 2017-09-17 LAB — LIPID PANEL
CHOL/HDL RATIO: 2.5 ratio
Cholesterol: 123 mg/dL (ref 0–200)
HDL: 49 mg/dL (ref >40)
LDL Cholesterol: 60 mg/dL (ref 0–99)
TRIGLYCERIDES: 69 mg/dL (ref <150)
VLDL: 14 mg/dL (ref 0–40)

## 2017-09-17 LAB — TROPONIN I

## 2017-09-17 LAB — HEMOGLOBIN A1C
Hgb A1c MFr Bld: 5.5 % (ref 4.8–5.6)
Mean Plasma Glucose: 111.15 mg/dL

## 2017-09-17 LAB — GLUCOSE, CAPILLARY: Glucose-Capillary: 110 mg/dL — ABNORMAL HIGH (ref 65–99)

## 2017-09-17 LAB — CORTISOL-AM, BLOOD: Cortisol - AM: 9.2 ug/dL (ref 6.7–22.6)

## 2017-09-17 LAB — BRAIN NATRIURETIC PEPTIDE: B Natriuretic Peptide: 251.5 pg/mL — ABNORMAL HIGH (ref 0.0–100.0)

## 2017-09-17 MED ORDER — NITROGLYCERIN 0.4 MG SL SUBL: 0.4000 mg | SUBLINGUAL_TABLET | SUBLINGUAL | Status: DC | PRN

## 2017-09-17 MED ORDER — PREDNISOLONE ACETATE 1 % OP SUSP
1.0000 [drp] | OPHTHALMIC | Status: DC
  Administered 2017-09-17 – 2017-09-18 (×7): 1 [drp] via OPHTHALMIC
  Filled 2017-09-17: qty 1

## 2017-09-17 MED ORDER — CYCLOSPORINE 0.05 % OP EMUL
1.0000 [drp] | Freq: Two times a day (BID) | OPHTHALMIC | Status: DC
  Administered 2017-09-17 – 2017-09-18 (×3): 1 [drp] via OPHTHALMIC
  Filled 2017-09-17 (×4): qty 1

## 2017-09-17 MED ORDER — HYDRALAZINE HCL 20 MG/ML IJ SOLN: 5.0000 mg | INTRAMUSCULAR | Status: DC | PRN

## 2017-09-17 MED ORDER — ALLOPURINOL 300 MG PO TABS
300.0000 mg | ORAL_TABLET | Freq: Every day | ORAL | Status: DC
  Administered 2017-09-17 – 2017-09-18 (×2): 300 mg via ORAL
  Filled 2017-09-17 (×2): qty 1

## 2017-09-17 MED ORDER — ENOXAPARIN SODIUM 40 MG/0.4ML ~~LOC~~ SOLN
40.0000 mg | SUBCUTANEOUS | Status: DC
  Administered 2017-09-17 – 2017-09-18 (×2): 40 mg via SUBCUTANEOUS
  Filled 2017-09-17 (×2): qty 0.4

## 2017-09-17 MED ORDER — ONDANSETRON HCL 4 MG/2ML IJ SOLN: 4.0000 mg | Freq: Four times a day (QID) | INTRAMUSCULAR | Status: DC | PRN

## 2017-09-17 MED ORDER — METHOTREXATE 2.5 MG PO TABS: 2.5000 mg | ORAL_TABLET | Freq: Every day | ORAL | Status: DC

## 2017-09-17 MED ORDER — PANTOPRAZOLE SODIUM 40 MG PO TBEC
40.0000 mg | DELAYED_RELEASE_TABLET | Freq: Every day | ORAL | Status: DC
  Administered 2017-09-17 – 2017-09-18 (×2): 40 mg via ORAL
  Filled 2017-09-17 (×2): qty 1

## 2017-09-17 MED ORDER — ALPRAZOLAM 0.25 MG PO TABS: 0.2500 mg | ORAL_TABLET | Freq: Two times a day (BID) | ORAL | Status: DC | PRN

## 2017-09-17 MED ORDER — ATORVASTATIN CALCIUM 10 MG PO TABS
10.0000 mg | ORAL_TABLET | Freq: Every day | ORAL | Status: DC
  Administered 2017-09-17 – 2017-09-18 (×2): 10 mg via ORAL
  Filled 2017-09-17 (×2): qty 1

## 2017-09-17 MED ORDER — MORPHINE SULFATE (PF) 4 MG/ML IV SOLN: 2.0000 mg | INTRAVENOUS | Status: DC | PRN

## 2017-09-17 MED ORDER — PREDNISONE 20 MG PO TABS: 20.0000 mg | ORAL_TABLET | Freq: Every day | ORAL | Status: DC

## 2017-09-17 MED ORDER — CLOPIDOGREL BISULFATE 75 MG PO TABS
75.0000 mg | ORAL_TABLET | Freq: Every day | ORAL | Status: DC
  Administered 2017-09-17 – 2017-09-18 (×2): 75 mg via ORAL
  Filled 2017-09-17 (×2): qty 1

## 2017-09-17 MED ORDER — VERAPAMIL HCL ER 120 MG PO TBCR
120.0000 mg | EXTENDED_RELEASE_TABLET | Freq: Every day | ORAL | Status: DC
  Administered 2017-09-17 – 2017-09-18 (×2): 120 mg via ORAL
  Filled 2017-09-17 (×2): qty 1

## 2017-09-17 MED ORDER — ACETAMINOPHEN 325 MG PO TABS
650.0000 mg | ORAL_TABLET | ORAL | Status: DC | PRN
  Administered 2017-09-17: 650 mg via ORAL
  Filled 2017-09-17: qty 2

## 2017-09-17 MED ORDER — ASPIRIN EC 81 MG PO TBEC
81.0000 mg | DELAYED_RELEASE_TABLET | Freq: Every day | ORAL | Status: DC
  Administered 2017-09-17 – 2017-09-18 (×2): 81 mg via ORAL
  Filled 2017-09-17 (×2): qty 1

## 2017-09-17 MED ORDER — EZETIMIBE 10 MG PO TABS
10.0000 mg | ORAL_TABLET | Freq: Every day | ORAL | Status: DC
  Administered 2017-09-17 – 2017-09-18 (×2): 10 mg via ORAL
  Filled 2017-09-17 (×2): qty 1

## 2017-09-17 MED ORDER — ZOLPIDEM TARTRATE 5 MG PO TABS: 5.0000 mg | ORAL_TABLET | Freq: Every evening | ORAL | Status: DC | PRN

## 2017-09-17 MED ORDER — METOPROLOL TARTRATE 100 MG PO TABS
100.0000 mg | ORAL_TABLET | Freq: Two times a day (BID) | ORAL | Status: DC
  Administered 2017-09-17 – 2017-09-18 (×3): 100 mg via ORAL
  Filled 2017-09-17 (×3): qty 1

## 2017-09-17 NOTE — Progress Notes (Signed)
Patient is a 76 year old female with past medical history of coronary artery disease status post DES to OM on 9/18, HOCM, hypertension, hyperlipidemia and arthritis who presented to the emergency department with complaints of chest pain. Cardiology is following.  Patient's troponins have been cycled and  they are all negative.  Patient seen and examined the bedside this morning.  She is comfortable.Patient is currently chest pain-free.  Chest pain was atypical on presentation.  EKG did not show any acute ischemic changes.  She is hemodynamically  stable.  She ambulated on the hallway without any chest pain.  Cardiology evaluated her this morning .Cardiology is recommending that she can be discharged home if her echocardiogram comes out to be normal.  Patient is currently waiting for echocardiogram. Diet restarted.  When she is discharged, she will follow-up as an outpatient with cardiology.

## 2017-09-17 NOTE — ED Notes (Signed)
Label sent to main lab to run bnp blood test. 

## 2017-09-17 NOTE — Progress Notes (Signed)
Family requests password be "[email or web address]" placed on chart and pt information.

## 2017-09-17 NOTE — H&P (Signed)
History and Physical    Dana [name] QIH:[id number] DOB: Nov 18, 1940 DOA: 09/16/2017  Referring MD/NP/PA:   PCP: [name], MD   Patient coming from:  The patient is coming from home.  At baseline, pt is independent for most of ADL.   Chief Complaint: Chest pain  HPI: Dana [name] is a 77 y.o. female with medical history significant of HOCM, hypertension, hyperlipidemia, GERD, arthritis, CAD, s/p of DES, who presents with chest pain.  Pt speaks Samoan and cannot speak [place]. Her son is at bedside and helped in collecting the history. Pt states that chest pain started at about 7:30 PM. It is located in left central chest, sharp, 8 out of 10 in severity, nonradiating. It is not aggravated with any known factors. It is associated with mild SOB, no cough. No fever or chills. Patient did not have long distant traveling recently. No tenderness in the calf areas. Patient was nauseated and vomited once at home. Currently patient does not have nausea, vomiting, diarrhea or abdominal pain. She denies symptoms of UTI or unilateral weakness. Pt was treated with ASA and NTG by EMS. She becomes chest pain free in ED.   ED Course: pt was found to have negative troponin, WBC 7.3, electrolytes renal function okay, no fever, no tachycardia, O2 sat are 98% on room air, negative chest x-ray. Patient is placed on telemetry bed for observation.  Review of Systems:   General: no fevers, chills, no body weight gain, has poor appetite, has fatigue HEENT: no blurry vision, hearing changes or sore throat Respiratory: has dyspnea, no coughing, wheezing CV: has chest pain, no palpitations GI: had nausea, vomiting, no abdominal pain, diarrhea, constipation GU: no dysuria, burning on urination, increased urinary frequency, hematuria  Ext: no leg edema Neuro: no unilateral weakness, numbness, or tingling, no vision change or hearing loss Skin: no rash, no skin tear. MSK: No muscle spasm, no deformity, no limitation  of range of movement in spin Heme: No easy bruising.  Travel history: No recent long distant travel.  Allergy: No Known Allergies  Past Medical History:  Diagnosis Date  . Arthritis   . CAD (coronary artery disease)    DES to OM1 02/2017  . Essential hypertension   . Gout   . HOCM (hypertrophic obstructive cardiomyopathy) (HCC)   . Hyperlipidemia     Past Surgical History:  Procedure Laterality Date  . CORONARY BALLOON ANGIOPLASTY N/A 03/03/2017   Procedure: CORONARY BALLOON ANGIOPLASTY;  Surgeon: Marykay Lex, MD;  Location: [facility] INVASIVE CV LAB;  Service: Cardiovascular;  Laterality: N/A;  . CORONARY STENT INTERVENTION N/A 03/03/2017   Procedure: CORONARY STENT INTERVENTION;  Surgeon: Marykay Lex, MD;  Location: [facility] INVASIVE CV LAB;  Service: Cardiovascular;  Laterality: N/A;  . LEFT HEART CATH AND CORONARY ANGIOGRAPHY N/A 03/02/2017   Procedure: LEFT HEART CATH AND CORONARY ANGIOGRAPHY;  Surgeon: Tonny Bollman, MD;  Location: [facility] INVASIVE CV LAB;  Service: Cardiovascular;  Laterality: N/A;    Social History:  reports that she has never smoked. She has never used smokeless tobacco. She reports that she does not drink alcohol or use drugs.  Family History:  Family History  Problem Relation Age of Onset  . Hypertension Mother   . Liver disease Mother   . Alcohol abuse Father      Prior to Admission medications   Medication Sig Start Date End Date Taking? Authorizing Provider  allopurinol (ZYLOPRIM) 300 MG tablet Take 300 mg by mouth daily.    [provider]  aspirin EC 81 MG tablet Take 81 mg by mouth daily.    [provider]  atorvastatin (LIPITOR) 10 MG tablet Take 10 mg by mouth daily.    [provider]  clopidogrel (PLAVIX) 75 MG tablet Take 75 mg by mouth daily.    [provider]  ezetimibe (ZETIA) 10 MG tablet Take 10 mg by mouth daily.    [provider]  folic acid (FOLVITE) 1 MG tablet Take 1 mg by mouth daily.     [provider]  furosemide (LASIX) 40 MG tablet Take 40 mg by mouth daily as needed for fluid.     [provider]  methotrexate 2.5 MG tablet Take 2.5 mg by mouth daily.    [provider]  metoprolol (LOPRESSOR) 100 MG tablet Take 100 mg by mouth 2 (two) times daily.    [provider]  nitroGLYCERIN (NITROSTAT) 0.4 MG SL tablet Place 1 tablet (0.4 mg total) under the tongue every 5 (five) minutes as needed for chest pain. 03/04/17   Robbie Lis M, PA-C  pantoprazole (PROTONIX) 40 MG tablet Take 1 tablet (40 mg total) by mouth daily at 6 (six) AM. 01/31/17   Rodolph Bong, MD  prednisoLONE acetate (PRED FORTE) 1 % ophthalmic suspension PLACE ONE DROP INTO THE RIGHT EYE TWICE DAILY 01/20/17   [provider]  predniSONE (DELTASONE) 20 MG tablet Take 1 tablet (20 mg total) by mouth daily. 06/24/17   Benjiman Core, MD  RESTASIS MULTIDOSE 0.05 % ophthalmic emulsion PLACE ONE DROP IN BOTH EYES TWICE DAILY 12/26/16   [provider]  verapamil (CALAN-SR) 120 MG CR tablet Take 1 tablet (120 mg total) by mouth daily. 01/30/17   Rodolph Bong, MD    Physical Exam: Vitals:   09/16/17 2128  BP: 112/60  Pulse: 62  Resp: (!) 26  SpO2: 98%   General: Not in acute distress HEENT:       Eyes: PERRL, EOMI, no scleral icterus.       ENT: No discharge from the ears and nose, no pharynx injection, no tonsillar enlargement.        Neck: No JVD, no bruit, no mass felt. Heme: No neck lymph node enlargement. Cardiac: S1/S2, RRR, 4/6 systolic murmurs, No gallops or rubs. Respiratory:  No rales, wheezing, rhonchi or rubs. GI: Soft, nondistended, nontender, no rebound pain, no organomegaly, BS present. GU: No hematuria Ext: No pitting leg edema bilaterally. 2+DP/PT pulse bilaterally. Musculoskeletal: No joint deformities, No joint redness or warmth, no limitation of ROM in spin. Skin: No rashes.  Neuro: Alert, oriented X3, cranial nerves  II-XII grossly intact, moves all extremities normally.  Psych: Patient is not psychotic, no suicidal or hemocidal ideation.  Labs on Admission: I have personally reviewed following labs and imaging studies  CBC: Recent Labs  Lab 09/16/17 2236  WBC 7.3  NEUTROABS 5.8  HGB 10.2*  HCT 31.3*  MCV 86.9  PLT 215   Basic Metabolic Panel: Recent Labs  Lab 09/16/17 2236  NA 138  K 3.6  CL 106  CO2 23  GLUCOSE 117*  BUN 17  CREATININE 0.81  CALCIUM 8.7*   GFR: CrCl cannot be calculated (Unknown ideal weight.). Liver Function Tests: No results for input(s): AST, ALT, ALKPHOS, BILITOT, PROT, ALBUMIN in the last 168 hours. No results for input(s): LIPASE, AMYLASE in the last 168 hours. No results for input(s): AMMONIA in the last 168 hours. Coagulation Profile: No results for input(s): INR,  PROTIME in the last 168 hours. Cardiac Enzymes: No results for input(s): CKTOTAL, CKMB, CKMBINDEX, TROPONINI in the last 168 hours. BNP (last 3 results) No results for input(s): PROBNP in the last 8760 hours. HbA1C: No results for input(s): HGBA1C in the last 72 hours. CBG: No results for input(s): GLUCAP in the last 168 hours. Lipid Profile: No results for input(s): CHOL, HDL, LDLCALC, TRIG, CHOLHDL, LDLDIRECT in the last 72 hours. Thyroid Function Tests: No results for input(s): TSH, T4TOTAL, FREET4, T3FREE, THYROIDAB in the last 72 hours. Anemia Panel: No results for input(s): VITAMINB12, FOLATE, FERRITIN, TIBC, IRON, RETICCTPCT in the last 72 hours. Urine analysis: No results found for: COLORURINE, APPEARANCEUR, LABSPEC, PHURINE, GLUCOSEU, HGBUR, BILIRUBINUR, KETONESUR, PROTEINUR, UROBILINOGEN, NITRITE, LEUKOCYTESUR Sepsis Labs: @LABRCNTIP (procalcitonin:4,lacticidven:4) )No results found for this or any previous visit (from the past 240 hour(s)).   Radiological Exams on Admission: Dg Chest 2 View  Result Date: 09/16/2017 CLINICAL DATA:  Left chest pain EXAM: CHEST - 2 VIEW  COMPARISON:  02/28/2017 FINDINGS: The lungs are clear without focal pneumonia, edema, pneumothorax or pleural effusion. Cardiopericardial silhouette is at upper limits of normal for size. The visualized bony structures of the thorax are intact. Telemetry leads overlie the chest. IMPRESSION: No active cardiopulmonary disease. Electronically Signed   By: 04/30/2017 M.D.   On: 09/16/2017 22:04     EKG: Independently reviewed.  Sinus rhythm, QTC 473, diffused T-wave inversion, ST elevation in V1, minor ST elevation in V2, the EKG has no significant change compared with previous EKG.     Assessment/Plan Principal Problem:   Chest pain Active Problems:   HTN (hypertension)   Hyperlipidemia   Gout   Arthritis   Anemia   CAD (coronary artery disease)   HOCM (hypertrophic obstructive cardiomyopathy) (HCC)   Chest pain and hx of CAD: s/p of stent 02/2017. CKG showed diffused T-wave inversion, ST elevation in V1, minor ST elevation in V2, but there is no  significant change compared with previous EKG. Currently she is CP free, unlikley to have STEMI.   - will place on Tele bed for obs - cycle CE q6 x3 and repeat EKG in the am  - prn Nitroglycerin, Morphine, and aspirin, plavis, lipitor zetia - Risk factor stratification: will check FLP, UDS and A1C  - 2d echo - Inpatient non-urgent card consult order was put in Epic and message to 03/2017 was sent out.  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 -continue methotrexate and folic acid  Anemia: stable. hgb 10.2 -f/u by CBC  HOCM (hypertrophic obstructive cardiomyopathy) (HCC):  -continue metoprolol and Verapamil -2d echo on 01/30/17 showed: "Left ventricle: Findings consistant with HOCM> SAM LVOT gradient  hard to differentiate from MR signal but likely velocity is between 2-56m/sec. Severe basal septal  hypertrophy. Systolic function was vigorous. The estimated ejection fraction was in the range of 65% to 70%".    DVT ppx: Q Lovenox Code Status: Full code Family Communication:  Yes, patient's son   at bed side Disposition Plan:  Anticipate discharge back to previous home environment Consults called:  none Admission status: Obs / tele    Date of Service 09/17/2017    09/19/2017 [facility] Pager [phone]  If 7PM-7AM, please contact night-coverage [email or web address] Password [facility] 09/17/2017, 12:48 AM

## 2017-09-17 NOTE — Plan of Care (Signed)
Called ED, received report from [place].

## 2017-09-17 NOTE — Consult Note (Addendum)
Cardiology Consultation:   Patient IDStephie Flink; [id number]; 1940-09-29   Admit date: 09/16/2017 Date of Consult: 09/17/2017  Primary Care Provider: [name], MD Primary Cardiologist: Nona Dell, MD   Patient Profile:   Dana [name] is a 77 y.o. female with a hx of CAD s/p DES to OM 02/2017, HOCM, HTN, HLD and arthritis who is being seen today for the evaluation of chest pain at the request of Dr. Clyde Lundborg.   Echo 01/2017 showed vigorous LVEF (65-70%) with LVOT gradient, velocity between 2 and 3 m/s.   Admitted to [facility] 02/2017 with chest pain and underwent cardiac catheterization revealing single vessel obstructive disease involving a large first obtuse marginal with otherwise moderate LAD stenosis, mild to moderate left main stenosis, and a small nondominant RCA. She underwent intervention with DES to the first obtuse marginal and was otherwise managed medically.   She was doing well on cardiac stand point when last seen by Dr. Diona Browner 06/2017.  History of Present Illness:   Pt speaks Samoan and cannot speak [place]. Her son is at bedside provided translation.   Dana [name] was in USOH up until yesterday when had sudden onset of yesterday. She had sudden onset on left sided chest pain after eating milk and some toast. Describes as sharp with severe diaphoresis. No associated SOB or radiated. She took one SL nitro and ASA 81mg  x 2. Pain did not improved but had vomiting. EMS called and took another ASA 81mg  x2. She was given another SL nitro by EMS. Her pain eventually subsided in ER. She was admitted for observation. Her pain is similar when she had stent placement last year. Compliant with medications. No exertional dyspnea or chest pain. No orthopnea, PND, syncope, dizziness, palpitation or melena.     Electrolytes and Scr stable. Troponin negative so far. BNP 251.5. CXR without acute abnormality. BP stable.    Past Medical History:  Diagnosis Date  . Arthritis   .  CAD (coronary artery disease)    DES to OM1 02/2017  . Essential hypertension   . Gout   . HOCM (hypertrophic obstructive cardiomyopathy) (HCC)   . Hyperlipidemia     Past Surgical History:  Procedure Laterality Date  . CORONARY BALLOON ANGIOPLASTY N/A 03/03/2017   Procedure: CORONARY BALLOON ANGIOPLASTY;  Surgeon: Marykay Lex, MD;  Location: [facility] INVASIVE CV LAB;  Service: Cardiovascular;  Laterality: N/A;  . CORONARY STENT INTERVENTION N/A 03/03/2017   Procedure: CORONARY STENT INTERVENTION;  Surgeon: Marykay Lex, MD;  Location: [facility] INVASIVE CV LAB;  Service: Cardiovascular;  Laterality: N/A;  . LEFT HEART CATH AND CORONARY ANGIOGRAPHY N/A 03/02/2017   Procedure: LEFT HEART CATH AND CORONARY ANGIOGRAPHY;  Surgeon: Tonny Bollman, MD;  Location: [facility] INVASIVE CV LAB;  Service: Cardiovascular;  Laterality: N/A;     Inpatient Medications: Scheduled Meds: . allopurinol  300 mg Oral Daily  . aspirin EC  81 mg Oral Daily  . atorvastatin  10 mg Oral Daily  . clopidogrel  75 mg Oral Daily  . cycloSPORINE  1 drop Both Eyes BID  . enoxaparin (LOVENOX) injection  40 mg Subcutaneous Q24H  . ezetimibe  10 mg Oral Daily  . methotrexate  2.5 mg Oral Daily  . metoprolol tartrate  100 mg Oral BID  . pantoprazole  40 mg Oral Q0600  . prednisoLONE acetate  1 drop Both Eyes Q2H while awake  . verapamil  120 mg Oral Daily   Continuous Infusions:  PRN Meds: acetaminophen, ALPRAZolam,  hydrALAZINE, morphine injection, nitroGLYCERIN, ondansetron (ZOFRAN) IV, zolpidem  Allergies:   No Known Allergies  Social History:   Social History   Socioeconomic History  . Marital status: Widowed    Spouse name: Not on file  . Number of children: Not on file  . Years of education: Not on file  . Highest education level: Not on file  Occupational History  . Not on file  Social Needs  . Financial resource strain: Not on file  . Food insecurity:    Worry: Not on file    Inability: Not on file  .  Transportation needs:    Medical: Not on file    Non-medical: Not on file  Tobacco Use  . Smoking status: Never Smoker  . Smokeless tobacco: Never Used  Substance and Sexual Activity  . Alcohol use: No  . Drug use: No  . Sexual activity: Not on file  Lifestyle  . Physical activity:    Days per week: Not on file    Minutes per session: Not on file  . Stress: Not on file  Relationships  . Social connections:    Talks on phone: Not on file    Gets together: Not on file    Attends religious service: Not on file    Active member of club or organization: Not on file    Attends meetings of clubs or organizations: Not on file    Relationship status: Not on file  . Intimate partner violence:    Fear of current or ex partner: Not on file    Emotionally abused: Not on file    Physically abused: Not on file    Forced sexual activity: Not on file  Other Topics Concern  . Not on file  Social History Narrative  . Not on file    Family History:   Family History  Problem Relation Age of Onset  . Hypertension Mother   . Liver disease Mother   . Alcohol abuse Father      ROS:  Please see the history of present illness.   All other ROS reviewed and negative.     Physical Exam/Data:   Vitals:   09/17/17 0045 09/17/17 0115 09/17/17 0200 09/17/17 0328  BP: 117/66 108/81 100/73 115/60  Pulse: 74 78 72 67  Resp: 19 (!) 29 (!) 26 18  Temp:    97.7 F (36.5 C)  TempSrc:    Oral  SpO2: 100% 97% 97% 98%  Weight:    75.8 kg (167 lb 3.2 oz)  Height:    5\' 2"  (1.575 m)   No intake or output data in the 24 hours ending 09/17/17 0746 Filed Weights   09/17/17 0328  Weight: 75.8 kg (167 lb 3.2 oz)   Body mass index is 30.58 kg/m.  General:  Well nourished, well developed, in no acute distress HEENT: normal Lymph: no adenopathy Neck: no JVD Endocrine:  No thryomegaly Vascular: No carotid bruits; FA pulses 2+ bilaterally without bruits  Cardiac:  normal S1, S2; RRR; Systolic  murmur Lungs:  clear to auscultation bilaterally, no wheezing, rhonchi or rales  Abd: soft, nontender, no hepatomegaly  Ext: no edema Musculoskeletal:  No deformities, BUE and BLE strength normal and equal Skin: warm and dry  Neuro:  CNs 2-12 intact, no focal abnormalities noted Psych:  Normal affect   EKG:  The EKG was personally reviewed and demonstrates:  Sinus rhythm with global TWI - no acute changes Telemetry:  Telemetry was personally reviewed and  demonstrates:  Sinus rhythm   Relevant CV Studies: Echocardiogram 01/30/2017: Study Conclusions  - Left ventricle: Findings consistant with HOCM> SAM LVOT gradient hard to differentiate from MR signal but likely velocity is between 2-47m/sec. Severe basal septal hypertrophy. Systolic function was vigorous. The estimated ejection fraction was in the range of 65% to 70%. - Left atrium: The atrium was mildly dilated. - Atrial septum: No defect or patent foramen ovale was identified. - Pulmonary arteries: PA peak pressure: 39 mm Hg (S).  Cardiac catheterization 03/02/2017: 1. Severe single-vessel coronary artery disease involving a large first obtuse marginal branch of the left dominant circumflex 2. Moderate LAD stenosis 3. Small nondominant RCA with no significant obstruction 4. Mild to moderate left main stenosis 5. Known hypertrophic cardiomyopathy  Recommend PCI of the obtuse marginal. 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 The patient will be loaded with Plavix. Will schedule her PCI for tomorrow. Would use femoral artery access and consider either a 5 [place] guide catheter or a guide catheter with sideholes. Plan discussed at length with the patient's daughter. Note the patient is non-English speaking.   Coronary PCI 03/03/2017:  Ost LM lesion, 40 %stenosed.  1st Mrg lesion, 90 %stenosed.  A STENT SIERRA 3.00 X 12 MM drug eluting stent was successfully  placed.  Post intervention, there is a 0% residual stenosis.  Lat 1st Mrg lesion, 60 %stenosed. Bifurcation lesion jailed by the initial stent  Post intervention - rescue PTCA only, there is a 20% residual stenosis.  Successful bifurcation PCI of large OM1 with major branch of OM1 PTCA. A Xience Sierra DES stent was used for the main branch and then PTCA was performed through the stent into the side branch for ostial stenosis.  Laboratory Data:  Chemistry Recent Labs  Lab 09/16/17 2236  NA 138  K 3.6  CL 106  CO2 23  GLUCOSE 117*  BUN 17  CREATININE 0.81  CALCIUM 8.7*  GFRNONAA >60  GFRAA >60  ANIONGAP 9    Hematology Recent Labs  Lab 09/16/17 2236  WBC 7.3  RBC 3.60*  HGB 10.2*  HCT 31.3*  MCV 86.9  MCH 28.3  MCHC 32.6  RDW 15.7*  PLT 215   Cardiac Enzymes Recent Labs  Lab 09/17/17 0013  TROPONINI <0.03    Recent Labs  Lab 09/16/17 2253  TROPIPOC 0.01    BNP Recent Labs  Lab 09/16/17 2236  BNP 251.5*    Radiology/Studies:  Dg Chest 2 View  Result Date: 09/16/2017 CLINICAL DATA:  Left chest pain EXAM: CHEST - 2 VIEW COMPARISON:  02/28/2017 FINDINGS: The lungs are clear without focal pneumonia, edema, pneumothorax or pleural effusion. Cardiopericardial silhouette is at upper limits of normal for size. The visualized bony structures of the thorax are intact. Telemetry leads overlie the chest. IMPRESSION: No active cardiopulmonary disease. Electronically Signed   By: Kennith Center M.D.   On: 09/16/2017 22:04    Assessment and Plan:   1. Chest pain  - atypical symptoms however history is unreliable. Her pain is resolved with nitro and similar to prior angina. Troponin remained negative. EKG without acute ischemic changes.  2. CAD - s/p bifurcation PCI of large OM1 with major branch of OM1 PTCA with Xience Sierra DES. - Continue ASA, Plavix, statin and BB  3. HTN - Stable on current medications  4. HOCM - Echo with LVEF with LVOT gradient,  velocity between 2 and 3 m/s. No  syncope.  - HR and BP stable  5. HLD - 09/17/2017: Cholesterol 123; HDL 49; LDL Cholesterol 60; Triglycerides 69; VLDL 14  - Continue statin  Pending echo. Ambulated in hallway. If no recurrent angina and echo with normal LVEF, She can be discharge later today with outpatient follow up.   For questions or updates, please contact [facility] Please consult [email or web address] for contact info under Cardiology/STEMI.   Vonzella Nipple [place], [place]  09/17/2017 7:46 AM    Attending note:  Patient seen and examined.  Discussed case with Mr. Ross Marcus.  Patient is known to me, last seen in the office back in January and clinically stable at that time.  She presents to the hospital with recent episode of chest discomfort at rest.  This occurred after having milk and toast, felt nauseated, took nitroglycerin, ultimately felt better after emesis.  She otherwise has been compliant with her medications and not reporting regular exertional chest pain.  On examination this morning she is resting comfortably, has not had recurring chest pain in the hospital.  She is in sinus rhythm by telemetry which I personally reviewed, heart rate in the 60s-70s, systolic blood pressure in the 110-120 range.  Lungs are clear without labored breathing.  Cardiac exam reveals RRR with 2/6 systolic murmur.  Lab work shows normal troponin I levels less than 0.03, potassium 3.6, BUN 17, creatinine 0.81, BNP 251, hemoglobin 10.2, platelets 215.  ECG reviewed and chronically abnormal with LVH and repolarization abnormalities.  Chest x-ray reports no acute process.  Episode of chest pain at rest, possibly GI in etiology based on description.  No clear evidence of ACS by cardiac enzymes and ECG chronically abnormal.  Limited echocardiogram has been ordered by the primary team.  If no new wall motion abnormalities or decrease in LVEF documented, and she does well with ambulation on the hall,  anticipate discharge home today on cardiac regimen with office follow-up.  Jonelle Sidle, M.D., F.A.C.C.

## 2017-09-18 ENCOUNTER — Observation Stay (HOSPITAL_BASED_OUTPATIENT_CLINIC_OR_DEPARTMENT_OTHER): Payer: Medicare Other

## 2017-09-18 ENCOUNTER — Other Ambulatory Visit: Payer: Self-pay

## 2017-09-18 DIAGNOSIS — I340 Nonrheumatic mitral (valve) insufficiency: Secondary | ICD-10-CM

## 2017-09-18 DIAGNOSIS — R0789 Other chest pain: Secondary | ICD-10-CM | POA: Diagnosis not present

## 2017-09-18 DIAGNOSIS — R079 Chest pain, unspecified: Secondary | ICD-10-CM | POA: Diagnosis not present

## 2017-09-18 LAB — ECHOCARDIOGRAM COMPLETE
Height: 62 in
Weight: 2691.2 oz

## 2017-09-18 MED ORDER — METHOTREXATE 2.5 MG PO TABS: 15.0000 mg | ORAL_TABLET | ORAL | Status: DC

## 2017-09-18 NOTE — Care Management Obs Status (Signed)
MEDICARE OBSERVATION STATUS NOTIFICATION   Patient Details  Name: Dana [name] MRN: [id number] Date of Birth: Jan 01, 1941   Medicare Observation Status Notification Given:  Yes    Lawerance Sabal, RN 09/18/2017, 9:36 AM

## 2017-09-18 NOTE — Discharge Summary (Signed)
Physician Discharge Summary  Dana [name] JSE:[id number] DOB: 1941-03-19 DOA: 09/16/2017  PCP: [name], MD  Admit date: 09/16/2017 Discharge date: 09/18/2017  Admitted From: Home Disposition: Home  Home Health:Home Discharge Condition:Stable CODE STATUS:Full Diet recommendation: Heart Healthy   Brief/Interim Summary: Patient is a 77 year old female with past medical history of coronary artery disease status post DES to OM on 9/18, HOCM, hypertension, hyperlipidemia and arthritis who presented to the emergency department with complaints of chest pain. Cardiology evaluated her .  Patient's troponins have been cycled and  they are all negative.  Patient seen and examined the bedside this morning.  She is comfortable.Patient is currently chest pain-free.  Chest pain was atypical on presentation.  EKG did not show any acute ischemic changes.  She is hemodynamically  stable.  She ambulated on the hallway without any chest pain.  Cardiology evaluated  and recommended that that she can be discharged home if her echocardiogram comes out to be normal.  Echocardiogram showed  Moderate asymmetric hypertrophy of the septum. The appearance was consistent with hypertrophic cardiomyopathy. Systolic function was hyperdynamic. The estimated ejection fraction was in the range of 75% to 80%. No wall motion abnormalities. She will follow-up as an outpatient with cardiology.  Following problems were addressed during her hospitalization:  Chest pain and hx of CAD: s/p of stent 02/2017. EKG showed diffused T-wave inversion, ST elevation in V1, minor ST elevation in V2, but there is no  significant change compared with previous EKG. Currently she is CP free. Troponins cycled and they are all negative.  Cardiology evaluated the patient Echocardiogram report as above.  HTN:  Continue home medications: metoprolol, verapamil Currently blood pressure stable  HLD: Continue lipitor and Zetia  Gout: Continue home  allopurinol   Arthritis: Has H/O RA.Continue methotrexate and folic acid  Anemia: Stable   Discharge Diagnoses:  Principal Problem:   Chest pain Active Problems:   HTN (hypertension)   Hyperlipidemia   Gout   Arthritis   Anemia   CAD (coronary artery disease)   HOCM (hypertrophic obstructive cardiomyopathy) (HCC)    Discharge Instructions  Discharge Instructions    Diet - low sodium heart healthy   Complete by:  As directed    Discharge instructions   Complete by:  As directed    1) Follow up with Cardiology as an outpatient. 2) Continue taking your medications at home.   Increase activity slowly   Complete by:  As directed      Allergies as of 09/18/2017   No Known Allergies     Medication List    TAKE these medications   allopurinol 300 MG tablet Commonly known as:  ZYLOPRIM Take 300 mg by mouth daily as needed (for gout).   aspirin EC 81 MG tablet Take 81 mg by mouth daily.   atorvastatin 10 MG tablet Commonly known as:  LIPITOR Take 10 mg by mouth daily.   clopidogrel 75 MG tablet Commonly known as:  PLAVIX Take 75 mg by mouth daily.   ezetimibe 10 MG tablet Commonly known as:  ZETIA Take 10 mg by mouth daily.   fluticasone 50 MCG/ACT nasal spray Commonly known as:  FLONASE Place 2 sprays into both nostrils daily.   folic acid 1 MG tablet Commonly known as:  FOLVITE Take 1 mg by mouth daily.   furosemide 40 MG tablet Commonly known as:  LASIX Take 40 mg by mouth daily as needed for fluid.   methotrexate 2.5 MG tablet Take 15 mg by  mouth once a week. On Tuesday   metoprolol tartrate 100 MG tablet Commonly known as:  LOPRESSOR Take 100 mg by mouth 2 (two) times daily.   nitroGLYCERIN 0.4 MG SL tablet Commonly known as:  NITROSTAT Place 1 tablet (0.4 mg total) under the tongue every 5 (five) minutes as needed for chest pain.   pantoprazole 40 MG tablet Commonly known as:  PROTONIX Take 40 mg by mouth daily.   prednisoLONE  acetate 1 % ophthalmic suspension Commonly known as:  PRED FORTE PLACE ONE DROP INTO THE RIGHT EYE TWICE DAILY   RESTASIS MULTIDOSE 0.05 % ophthalmic emulsion Generic drug:  cycloSPORINE PLACE ONE DROP IN BOTH EYES TWICE DAILY   verapamil 120 MG CR tablet Commonly known as:  CALAN-SR Take 1 tablet (120 mg total) by mouth daily.      Follow-up Information    Ellsworth Lennox, PA-C. Go on 10/04/2017.   Specialties:  Physician Assistant, Cardiology Why:  @1 :30 pm for hospital follow up with Dr. Ival Bible PA Contact information: [address] [place] [address] [phone]        [name], MD. Schedule an appointment as soon as possible for a visit in 1 week(s).   Specialty:  Internal Medicine Contact information: [address] [place] [address] [phone]          No Known Allergies  Consultations: Cardiology  Procedures/Studies: Dg Chest 2 View  Result Date: 09/16/2017 CLINICAL DATA:  Left chest pain EXAM: CHEST - 2 VIEW COMPARISON:  02/28/2017 FINDINGS: The lungs are clear without focal pneumonia, edema, pneumothorax or pleural effusion. Cardiopericardial silhouette is at upper limits of normal for size. The visualized bony structures of the thorax are intact. Telemetry leads overlie the chest. IMPRESSION: No active cardiopulmonary disease. Electronically Signed   By: Kennith Center M.D.   On: 09/16/2017 22:04   (Echo, Carotid, EGD, Colonoscopy, ERCP)    Subjective: Patient seen and examined the bedside this morning.  Remains comfortable.  No chest pain.  Discharge Exam: Vitals:   09/18/17 0806 09/18/17 1128  BP: 128/68 96/70  Pulse: 65 (!) 59  Resp:  18  Temp:  97.8 F (36.6 C)  SpO2:  99%   Vitals:   09/18/17 0100 09/18/17 0506 09/18/17 0806 09/18/17 1128  BP: (!) 101/53 (!) 116/58 128/68 96/70  Pulse: 67 (!) 54 65 (!) 59  Resp: 18 18  18   Temp: (!) 97.2 F (36.2 C) 97.7 F (36.5 C)  97.8 F (36.6 C)  TempSrc: Oral  Oral  Oral  SpO2: 96% 96%  99%  Weight:  76.3 kg (168 lb 3.2 oz)    Height:        General: Pt is alert, awake, not in acute distress Cardiovascular: RRR, S1/S2 +, no rubs, no gallops Respiratory: CTA bilaterally, no wheezing, no rhonchi Abdominal: Soft, NT, ND, bowel sounds + Extremities: no edema, no cyanosis    The results of significant diagnostics from this hospitalization (including imaging, microbiology, ancillary and laboratory) are listed below for reference.     Microbiology: No results found for this or any previous visit (from the past 240 hour(s)).   Labs: BNP (last 3 results) Recent Labs    01/29/17 1305 09/16/17 2236  BNP 372.0* 251.5*   Basic Metabolic Panel: Recent Labs  Lab 09/16/17 2236  NA 138  K 3.6  CL 106  CO2 23  GLUCOSE 117*  BUN 17  CREATININE 0.81  CALCIUM 8.7*   Liver  Function Tests: No results for input(s): AST, ALT, ALKPHOS, BILITOT, PROT, ALBUMIN in the last 168 hours. No results for input(s): LIPASE, AMYLASE in the last 168 hours. No results for input(s): AMMONIA in the last 168 hours. CBC: Recent Labs  Lab 09/16/17 2236  WBC 7.3  NEUTROABS 5.8  HGB 10.2*  HCT 31.3*  MCV 86.9  PLT 215   Cardiac Enzymes: Recent Labs  Lab 09/17/17 0013 09/17/17 0627 09/17/17 1349  TROPONINI <0.03 <0.03 <0.03   BNP: Invalid input(s): POCBNP CBG: Recent Labs  Lab 09/17/17 1653  GLUCAP 110*   D-Dimer No results for input(s): DDIMER in the last 72 hours. Hgb A1c Recent Labs    09/16/17 2236  HGBA1C 5.5   Lipid Profile Recent Labs    09/17/17 0627  CHOL 123  HDL 49  LDLCALC 60  TRIG 69  CHOLHDL 2.5   Thyroid function studies No results for input(s): TSH, T4TOTAL, T3FREE, THYROIDAB in the last 72 hours.  Invalid input(s): FREET3 Anemia work up No results for input(s): VITAMINB12, FOLATE, FERRITIN, TIBC, IRON, RETICCTPCT in the last 72 hours. Urinalysis No results found for: COLORURINE, APPEARANCEUR, LABSPEC,  PHURINE, GLUCOSEU, HGBUR, BILIRUBINUR, KETONESUR, PROTEINUR, UROBILINOGEN, NITRITE, LEUKOCYTESUR Sepsis Labs Invalid input(s): PROCALCITONIN,  WBC,  LACTICIDVEN Microbiology No results found for this or any previous visit (from the past 240 hour(s)).   Time coordinating discharge: Over 30 minutes  SIGNED:   Burnadette Pop, MD  [facility] 09/18/2017, 3:49 PM Pager [phone]  If 7PM-7AM, please contact night-coverage [email or web address] Password [email or web address]

## 2017-09-18 NOTE — Progress Notes (Signed)
Echo results not available at this time.

## 2017-09-18 NOTE — Progress Notes (Signed)
Pt discharge instructions reviewed with pt and son-in-law. Pt and son-in-law verbalize understanding and state they have no questions. Pt belongings with pt. Pt is not in distress. Pt discharged via wheelchair. Pt's son-in-law is driving her home.

## 2017-09-18 NOTE — Progress Notes (Signed)
   Consult note reviewed Awaiting echo.  Please call us if any further questions.  Signing off.  Donato Schultz, MD

## 2017-09-18 NOTE — Progress Notes (Signed)
  Echocardiogram 2D Echocardiogram has been performed.  [name] 09/18/2017, 11:14 AM

## 2017-09-18 NOTE — Progress Notes (Signed)
RN paged MD echo results.

## 2017-10-03 NOTE — Progress Notes (Signed)
Cardiology Office Note    Date:  10/04/2017   ID:  Chastin, Garlitz 1940/09/26, MRN [id number]  PCP:  [name], MD  Cardiologist: Nona Dell, MD    Chief Complaint  Patient presents with  . Hospitalization Follow-up    History of Present Illness:    Dana [name] is a 77 y.o. female with past medical history of CAD (s/p DES to OM1 in 02/2017), HOCM, HTN, and HLD who resents to the office today for hospital follow-up.  Sgh was recently admitted to [facility] from 3/23 - 09/18/2017 for evaluation of chest pain. Reported having sudden onset left-sided chest pain when consuming breakfast with the pain resolving after a vomiting episode. Cyclic troponin values remained negative and her echocardiogram showed hyperdynamic LV function with an EF of 75-80% and dynamic obstruction at rest and the outflow tract with grade 2 diastolic dysfunction and mild to moderate MR. Her overall presenting symptoms were thought to be most consistent with a GI etiology and no further cardiac testing was pursued.  In talking with the patient today, she reports overall doing well from a cardiac perspective since her recent hospitalization. Most history is provided by the patient's son as she does not speak [place]. She denies any repeat episodes of chest pain.  No recent dyspnea on exertion, orthopnea, PND, lower extremity edema, or palpitations. She has been suffering from allergies due to the pollen.   She does not check blood pressure regularly at home but it is well controlled at 118/64 during today's visit. Reports good compliance with her medication regimen.    Past Medical History:  Diagnosis Date  . Arthritis   . CAD (coronary artery disease)    a. s/p DES to OM1 in 02/2017  . Essential hypertension   . Gout   . HOCM (hypertrophic obstructive cardiomyopathy) (HCC)   . Hyperlipidemia     Past Surgical History:  Procedure Laterality Date  . CORONARY BALLOON ANGIOPLASTY N/A 03/03/2017   Procedure: CORONARY BALLOON ANGIOPLASTY;  Surgeon: Marykay Lex, MD;  Location: [facility] INVASIVE CV LAB;  Service: Cardiovascular;  Laterality: N/A;  . CORONARY STENT INTERVENTION N/A 03/03/2017   Procedure: CORONARY STENT INTERVENTION;  Surgeon: Marykay Lex, MD;  Location: [facility] INVASIVE CV LAB;  Service: Cardiovascular;  Laterality: N/A;  . LEFT HEART CATH AND CORONARY ANGIOGRAPHY N/A 03/02/2017   Procedure: LEFT HEART CATH AND CORONARY ANGIOGRAPHY;  Surgeon: Tonny Bollman, MD;  Location: [facility] INVASIVE CV LAB;  Service: Cardiovascular;  Laterality: N/A;    Current Medications: Outpatient Medications Prior to Visit  Medication Sig Dispense Refill  . allopurinol (ZYLOPRIM) 300 MG tablet Take 300 mg by mouth daily as needed (for gout).     [name] aspirin EC 81 MG tablet Take 81 mg by mouth daily.    [name] atorvastatin (LIPITOR) 10 MG tablet Take 10 mg by mouth daily.    . clopidogrel (PLAVIX) 75 MG tablet Take 75 mg by mouth daily.    [name] ezetimibe (ZETIA) 10 MG tablet Take 10 mg by mouth daily.    . fluticasone (FLONASE) 50 MCG/ACT nasal spray Place 2 sprays into both nostrils daily.    . folic acid (FOLVITE) 1 MG tablet Take 1 mg by mouth daily.    . furosemide (LASIX) 40 MG tablet Take 40 mg by mouth daily as needed for fluid.     . methotrexate 2.5 MG tablet Take 15 mg by mouth once a week. On Tuesday    . metoprolol (LOPRESSOR) 100  MG tablet Take 100 mg by mouth 2 (two) times daily.    . nitroGLYCERIN (NITROSTAT) 0.4 MG SL tablet Place 1 tablet (0.4 mg total) under the tongue every 5 (five) minutes as needed for chest pain. 25 tablet 2  . pantoprazole (PROTONIX) 40 MG tablet Take 40 mg by mouth daily.    . RESTASIS MULTIDOSE 0.05 % ophthalmic emulsion PLACE ONE DROP IN BOTH EYES TWICE DAILY  4  . verapamil (CALAN-SR) 120 MG CR tablet Take 1 tablet (120 mg total) by mouth daily. 30 tablet 0  . prednisoLONE acetate (PRED FORTE) 1 % ophthalmic suspension PLACE ONE DROP INTO THE RIGHT EYE TWICE DAILY  1    No facility-administered medications prior to visit.      Allergies:   Patient has no known allergies.   Social History   Socioeconomic History  . Marital status: Widowed    Spouse name: Not on file  . Number of children: Not on file  . Years of education: Not on file  . Highest education level: Not on file  Occupational History  . Not on file  Social Needs  . Financial resource strain: Not on file  . Food insecurity:    Worry: Not on file    Inability: Not on file  . Transportation needs:    Medical: Not on file    Non-medical: Not on file  Tobacco Use  . Smoking status: Never Smoker  . Smokeless tobacco: Never Used  Substance and Sexual Activity  . Alcohol use: No  . Drug use: No  . Sexual activity: Not on file  Lifestyle  . Physical activity:    Days per week: Not on file    Minutes per session: Not on file  . Stress: Not on file  Relationships  . Social connections:    Talks on phone: Not on file    Gets together: Not on file    Attends religious service: Not on file    Active member of club or organization: Not on file    Attends meetings of clubs or organizations: Not on file    Relationship status: Not on file  Other Topics Concern  . Not on file  Social History Narrative  . Not on file     Family History:  The patient's family history includes Alcohol abuse in her father; Hypertension in her mother; Liver disease in her mother.   Review of Systems:   Please see the history of present illness.     General:  No chills, fever, night sweats or weight changes. Positive for runny nose and sneezing.  Cardiovascular:  No chest pain, dyspnea on exertion, edema, orthopnea, palpitations, paroxysmal nocturnal dyspnea. Dermatological: No rash, lesions/masses Respiratory: No cough, dyspnea Urologic: No hematuria, dysuria Abdominal:   No nausea, vomiting, diarrhea, bright red blood per rectum, melena, or hematemesis Neurologic:  No visual changes, wkns,  changes in mental status. All other systems reviewed and are otherwise negative except as noted above.   Physical Exam:    VS:  BP 118/64   Pulse 64   Ht 5\' 2"  (1.575 m)   Wt 172 lb (78 kg)   SpO2 96%   BMI 31.46 kg/m    General: Well developed, well nourished,female appearing in no acute distress. Head: Normocephalic, atraumatic, sclera non-icteric, no xanthomas, nares are without discharge.  Neck: No carotid bruits. JVD not elevated.  Lungs: Respirations regular and unlabored, without wheezes or rales.  Heart: Regular rate and rhythm. No S3  or S4.  No rubs or gallops appreciated. 2/6 SEM.  Abdomen: Soft, non-tender, non-distended with normoactive bowel sounds. No hepatomegaly. No rebound/guarding. No obvious abdominal masses. Msk:  Strength and tone appear normal for age. No joint deformities or effusions. Extremities: No clubbing or cyanosis. No lower extremity edema.  Distal pedal pulses are 2+ bilaterally. Neuro: Alert and oriented X 3. Moves all extremities spontaneously. No focal deficits noted. Psych:  Responds to questions appropriately with a normal affect. Skin: No rashes or lesions noted  Wt Readings from Last 3 Encounters:  10/04/17 172 lb (78 kg)  09/18/17 168 lb 3.2 oz (76.3 kg)  07/14/17 169 lb (76.7 kg)     Studies/Labs Reviewed:   EKG:  EKG is not ordered today.    Recent Labs: 01/29/2017: Magnesium 2.1; TSH 1.045 03/02/2017: ALT 11 09/16/2017: B Natriuretic Peptide 251.5; BUN 17; Creatinine, Ser 0.81; Hemoglobin 10.2; Platelets 215; Potassium 3.6; Sodium 138   Lipid Panel    Component Value Date/Time   CHOL 123 09/17/2017 0627   TRIG 69 09/17/2017 0627   HDL 49 09/17/2017 0627   CHOLHDL 2.5 09/17/2017 0627   VLDL 14 09/17/2017 0627   LDLCALC 60 09/17/2017 0627    Additional studies/ records that were reviewed today include:   Echocardiogram: 09/18/2017 Study Conclusions  - Left ventricle: The cavity size was normal. There was moderate    asymmetric hypertrophy of the septum. The appearance was   consistent with hypertrophic cardiomyopathy. Systolic function   was hyperdynamic. The estimated ejection fraction was in the   range of 75% to 80%. There was dynamic obstruction at rest in the   outflow tract, with a peak velocity of 330 cm/sec and a peak   gradient of 44 mm Hg. The gradient increases to 66 mm Hg with the   Valsalva maneuver. Wall motion was normal; there were no regional   wall motion abnormalities. Features are consistent with a   pseudonormal left ventricular filling pattern, with concomitant   abnormal relaxation and increased filling pressure (grade 2   diastolic dysfunction). - Mitral valve: Calcified annulus. There was mild systolic anterior   motion of the anterior leaflet. There was mild to moderate   regurgitation directed eccentrically and posteriorly. - Left atrium: The atrium was mildly dilated. - Pulmonary arteries: PA peak pressure: 31 mm Hg (S).  Cardiac Catheterization: 03/03/2017  Ost LM lesion, 40 %stenosed.  1st Mrg lesion, 90 %stenosed.  A STENT SIERRA 3.00 X 12 MM drug eluting stent was successfully placed.  Post intervention, there is a 0% residual stenosis.  Lat 1st Mrg lesion, 60 %stenosed. Bifurcation lesion jailed by the initial stent  Post intervention - rescue PTCA only, there is a 20% residual stenosis.   Successful bifurcation PCI of large OM1 with major branch of OM1 PTCA. A Xience Sierra DES stent was used for the main branch and then PTCA was performed through the stent into the side branch for ostial stenosis.  Plan:  Transfer to [facility]) for sheath removal  Continue dual antiplatelet therapy for one year (would be okay to stop aspirin after 3 months if necessary)  Anticipate that she should be ready for discharge tomorrow  Assessment:    1. Coronary artery disease involving native coronary artery of native heart with angina pectoris (HCC)   2.  Hypertrophic cardiomyopathy (HCC)   3. Essential hypertension   4. Hyperlipidemia LDL goal <70   5. Non-rheumatic mitral regurgitation      Plan:  In order of problems listed above:  1. CAD - s/p DES to OM1 in 02/2017. Was recently admitted for evaluation of chest pain which was overall thought to be most consistent with a GI etiology as cyclic troponin values remained negative and her echocardiogram showed a preserved EF with no regional WMA.  - she denies any repeat episodes of chest pain and reports breathing has been at baseline. Would not pursue further cardiac testing at this time given her overall recent atypical presentation and no repeat episodes since.  - continue with DAPT (ASA and Plavix) along with BB and statin therapy.   2. HOCM - The patient has a known history of HOCM with recent echocardiogram showing hyperdynamic LV function with an EF of 75-80% and dynamic obstruction at rest and the outflow tract with grade 2 diastolic dysfunction and mild to moderate MR. - continue Verapamil and Lopressor.    3. HTN - BP is well controlled at 118/64 during today's visit. - Continue Lopressor 100 mg twice daily and Verapamil CR 120 mg daily.  4. HLD - Recent FLP showed total cholesterol 123, triglycerides 69, HDL 49, and LDL 60.  At goal of LDL less than 70. Continue Atorvastatin 10mg  daily and Zetia.   5. Mitral Regurgitation - mild to moderate by recent echo.  - continue to follow.    Medication Adjustments/Labs and Tests Ordered: Current medicines are reviewed at length with the patient today.  Concerns regarding medicines are outlined above.  Medication changes, Labs and Tests ordered today are listed in the Patient Instructions below. Patient Instructions  Medication Instructions:  Your physician recommends that you continue on your current medications as directed. Please refer to the Current Medication list given to you today.  Labwork: None    Testing/Procedures: None   Follow-Up: Your physician recommends that you schedule a follow-up appointment in: 4 Months with Dr. .   Any Other Special Instructions Will Be Listed Below (If Applicable).  May take Claritin as needed for allergies.   If you need a refill on your cardiac medications before your next appointment, please call your pharmacy.  Thank you for choosing [facility]!     Signed, Diona Browner, PA-C  10/04/2017 2:14 PM    [facility] [address] [place], [place] Phone: [phone]

## 2017-10-04 ENCOUNTER — Encounter: Payer: Self-pay | Admitting: Student

## 2017-10-04 ENCOUNTER — Ambulatory Visit (INDEPENDENT_AMBULATORY_CARE_PROVIDER_SITE_OTHER): Payer: Medicare Other | Admitting: Student

## 2017-10-04 VITALS — BP 118/64 | HR 64 | Ht 62.0 in | Wt 172.0 lb

## 2017-10-04 DIAGNOSIS — I10 Essential (primary) hypertension: Secondary | ICD-10-CM | POA: Diagnosis not present

## 2017-10-04 DIAGNOSIS — I25119 Atherosclerotic heart disease of native coronary artery with unspecified angina pectoris: Secondary | ICD-10-CM

## 2017-10-04 DIAGNOSIS — E785 Hyperlipidemia, unspecified: Secondary | ICD-10-CM | POA: Diagnosis not present

## 2017-10-04 DIAGNOSIS — I422 Other hypertrophic cardiomyopathy: Secondary | ICD-10-CM | POA: Diagnosis not present

## 2017-10-04 DIAGNOSIS — I340 Nonrheumatic mitral (valve) insufficiency: Secondary | ICD-10-CM

## 2017-10-04 NOTE — Patient Instructions (Signed)
Medication Instructions:  Your physician recommends that you continue on your current medications as directed. Please refer to the Current Medication list given to you today.   Labwork: None   Testing/Procedures: None   Follow-Up: Your physician recommends that you schedule a follow-up appointment in: 4 Months with Dr. Diona Browner.    Any Other Special Instructions Will Be Listed Below (If Applicable).  May Take Claritin    If you need a refill on your cardiac medications before your next appointment, please call your pharmacy.  Thank you for choosing [facility]!

## 2017-10-10 DIAGNOSIS — I2510 Atherosclerotic heart disease of native coronary artery without angina pectoris: Secondary | ICD-10-CM | POA: Insufficient documentation

## 2017-10-10 DIAGNOSIS — I421 Obstructive hypertrophic cardiomyopathy: Secondary | ICD-10-CM | POA: Insufficient documentation

## 2017-10-31 ENCOUNTER — Other Ambulatory Visit (HOSPITAL_COMMUNITY): Payer: Self-pay | Admitting: Family Medicine

## 2017-10-31 DIAGNOSIS — Z1231 Encounter for screening mammogram for malignant neoplasm of breast: Secondary | ICD-10-CM

## 2017-11-08 ENCOUNTER — Other Ambulatory Visit: Payer: Self-pay | Admitting: Cardiology

## 2017-11-15 ENCOUNTER — Emergency Department (HOSPITAL_COMMUNITY): Payer: Medicare Other

## 2017-11-15 ENCOUNTER — Observation Stay (HOSPITAL_COMMUNITY)
Admission: EM | Admit: 2017-11-15 | Discharge: 2017-11-16 | Disposition: A | Discharge status: Home / Self Care | Payer: Medicare Other | Attending: Internal Medicine | Admitting: Internal Medicine

## 2017-11-15 ENCOUNTER — Other Ambulatory Visit: Payer: Self-pay

## 2017-11-15 ENCOUNTER — Encounter (HOSPITAL_COMMUNITY): Payer: Self-pay | Admitting: *Deleted

## 2017-11-15 DIAGNOSIS — I4581 Long QT syndrome: Secondary | ICD-10-CM | POA: Diagnosis not present

## 2017-11-15 DIAGNOSIS — I421 Obstructive hypertrophic cardiomyopathy: Secondary | ICD-10-CM | POA: Diagnosis not present

## 2017-11-15 DIAGNOSIS — K219 Gastro-esophageal reflux disease without esophagitis: Secondary | ICD-10-CM | POA: Insufficient documentation

## 2017-11-15 DIAGNOSIS — Z8249 Family history of ischemic heart disease and other diseases of the circulatory system: Secondary | ICD-10-CM | POA: Diagnosis not present

## 2017-11-15 DIAGNOSIS — R0789 Other chest pain: Secondary | ICD-10-CM | POA: Diagnosis not present

## 2017-11-15 DIAGNOSIS — E669 Obesity, unspecified: Secondary | ICD-10-CM | POA: Insufficient documentation

## 2017-11-15 DIAGNOSIS — I700 Atherosclerosis of aorta: Secondary | ICD-10-CM | POA: Insufficient documentation

## 2017-11-15 DIAGNOSIS — Z7982 Long term (current) use of aspirin: Secondary | ICD-10-CM | POA: Diagnosis not present

## 2017-11-15 DIAGNOSIS — E785 Hyperlipidemia, unspecified: Secondary | ICD-10-CM | POA: Diagnosis not present

## 2017-11-15 DIAGNOSIS — Z8379 Family history of other diseases of the digestive system: Secondary | ICD-10-CM | POA: Diagnosis not present

## 2017-11-15 DIAGNOSIS — Z7902 Long term (current) use of antithrombotics/antiplatelets: Secondary | ICD-10-CM | POA: Insufficient documentation

## 2017-11-15 DIAGNOSIS — Z6831 Body mass index (BMI) 31.0-31.9, adult: Secondary | ICD-10-CM | POA: Insufficient documentation

## 2017-11-15 DIAGNOSIS — Z811 Family history of alcohol abuse and dependence: Secondary | ICD-10-CM | POA: Insufficient documentation

## 2017-11-15 DIAGNOSIS — M069 Rheumatoid arthritis, unspecified: Secondary | ICD-10-CM | POA: Diagnosis not present

## 2017-11-15 DIAGNOSIS — I422 Other hypertrophic cardiomyopathy: Secondary | ICD-10-CM | POA: Diagnosis not present

## 2017-11-15 DIAGNOSIS — M109 Gout, unspecified: Secondary | ICD-10-CM | POA: Diagnosis present

## 2017-11-15 DIAGNOSIS — R079 Chest pain, unspecified: Secondary | ICD-10-CM | POA: Diagnosis not present

## 2017-11-15 DIAGNOSIS — D649 Anemia, unspecified: Secondary | ICD-10-CM | POA: Diagnosis present

## 2017-11-15 DIAGNOSIS — I10 Essential (primary) hypertension: Secondary | ICD-10-CM | POA: Diagnosis present

## 2017-11-15 DIAGNOSIS — I2510 Atherosclerotic heart disease of native coronary artery without angina pectoris: Secondary | ICD-10-CM | POA: Diagnosis not present

## 2017-11-15 DIAGNOSIS — M1990 Unspecified osteoarthritis, unspecified site: Secondary | ICD-10-CM | POA: Insufficient documentation

## 2017-11-15 DIAGNOSIS — I25110 Atherosclerotic heart disease of native coronary artery with unstable angina pectoris: Secondary | ICD-10-CM | POA: Diagnosis not present

## 2017-11-15 DIAGNOSIS — Z79899 Other long term (current) drug therapy: Secondary | ICD-10-CM | POA: Insufficient documentation

## 2017-11-15 DIAGNOSIS — Z955 Presence of coronary angioplasty implant and graft: Secondary | ICD-10-CM | POA: Diagnosis not present

## 2017-11-15 DIAGNOSIS — R072 Precordial pain: Secondary | ICD-10-CM

## 2017-11-15 DIAGNOSIS — E782 Mixed hyperlipidemia: Secondary | ICD-10-CM | POA: Diagnosis not present

## 2017-11-15 HISTORY — DX: Rheumatoid arthritis, unspecified: M06.9

## 2017-11-15 LAB — CBC WITH DIFFERENTIAL/PLATELET
BASOS ABS: 0 10*3/uL (ref 0.0–0.1)
Basophils Relative: 0 %
Eosinophils Absolute: 0.3 10*3/uL (ref 0.0–0.7)
Eosinophils Relative: 5 %
HCT: 32.9 % — ABNORMAL LOW (ref 36.0–46.0)
HEMOGLOBIN: 10.4 g/dL — AB (ref 12.0–15.0)
LYMPHS ABS: 1.2 10*3/uL (ref 0.7–4.0)
LYMPHS PCT: 22 %
MCH: 26.8 pg (ref 26.0–34.0)
MCHC: 31.6 g/dL (ref 30.0–36.0)
MCV: 84.8 fL (ref 78.0–100.0)
Monocytes Absolute: 0.6 10*3/uL (ref 0.1–1.0)
Monocytes Relative: 11 %
NEUTROS ABS: 3.4 10*3/uL (ref 1.7–7.7)
NEUTROS PCT: 62 %
Platelets: 272 10*3/uL (ref 150–400)
RBC: 3.88 MIL/uL (ref 3.87–5.11)
RDW: 15.6 % — ABNORMAL HIGH (ref 11.5–15.5)
WBC: 5.5 10*3/uL (ref 4.0–10.5)

## 2017-11-15 LAB — MAGNESIUM: Magnesium: 2 mg/dL (ref 1.7–2.4)

## 2017-11-15 LAB — MRSA PCR SCREENING: MRSA by PCR: NEGATIVE

## 2017-11-15 LAB — BASIC METABOLIC PANEL
Anion gap: 9 (ref 5–15)
BUN: 20 mg/dL (ref 6–20)
CALCIUM: 9.1 mg/dL (ref 8.9–10.3)
CO2: 28 mmol/L (ref 22–32)
Chloride: 102 mmol/L (ref 101–111)
Creatinine, Ser: 0.88 mg/dL (ref 0.44–1.00)
Glucose, Bld: 108 mg/dL — ABNORMAL HIGH (ref 65–99)
Potassium: 3.7 mmol/L (ref 3.5–5.1)
SODIUM: 139 mmol/L (ref 135–145)

## 2017-11-15 LAB — TROPONIN I: Troponin I: <0.03 ng/mL (ref <0.03)

## 2017-11-15 LAB — PHOSPHORUS: Phosphorus: 5.5 mg/dL — ABNORMAL HIGH (ref 2.5–4.6)

## 2017-11-15 MED ORDER — VERAPAMIL HCL ER 240 MG PO TBCR
240.0000 mg | EXTENDED_RELEASE_TABLET | Freq: Every day | ORAL | Status: DC
  Administered 2017-11-15 – 2017-11-16 (×2): 240 mg via ORAL
  Filled 2017-11-15 (×2): qty 1

## 2017-11-15 MED ORDER — ASPIRIN EC 81 MG PO TBEC
81.0000 mg | DELAYED_RELEASE_TABLET | Freq: Every day | ORAL | Status: DC
Start: 2017-11-15 — End: 2017-11-16
  Administered 2017-11-15 – 2017-11-16 (×2): 81 mg via ORAL
  Filled 2017-11-15 (×2): qty 1

## 2017-11-15 MED ORDER — ACETAMINOPHEN 325 MG PO TABS: 650.0000 mg | ORAL_TABLET | ORAL | Status: DC | PRN

## 2017-11-15 MED ORDER — ALLOPURINOL 300 MG PO TABS: 300.0000 mg | ORAL_TABLET | Freq: Every day | ORAL | Status: DC | PRN

## 2017-11-15 MED ORDER — ENOXAPARIN SODIUM 40 MG/0.4ML ~~LOC~~ SOLN: 40.0000 mg | SUBCUTANEOUS | Status: DC

## 2017-11-15 MED ORDER — FLUTICASONE PROPIONATE 50 MCG/ACT NA SUSP
2.0000 | Freq: Every day | NASAL | Status: DC
  Administered 2017-11-15 – 2017-11-16 (×2): 2 via NASAL
  Filled 2017-11-15: qty 16

## 2017-11-15 MED ORDER — FUROSEMIDE 40 MG PO TABS: 40.0000 mg | ORAL_TABLET | Freq: Every day | ORAL | Status: DC | PRN

## 2017-11-15 MED ORDER — EZETIMIBE 10 MG PO TABS
10.0000 mg | ORAL_TABLET | Freq: Every day | ORAL | Status: DC
  Administered 2017-11-15 – 2017-11-16 (×2): 10 mg via ORAL
  Filled 2017-11-15 (×2): qty 1

## 2017-11-15 MED ORDER — SODIUM CHLORIDE 0.9 % IV SOLN
INTRAVENOUS | Status: DC
  Administered 2017-11-15 – 2017-11-16 (×2): via INTRAVENOUS

## 2017-11-15 MED ORDER — NITROGLYCERIN 0.4 MG SL SUBL
0.4000 mg | SUBLINGUAL_TABLET | SUBLINGUAL | Status: DC | PRN
  Administered 2017-11-15: 0.4 mg via SUBLINGUAL
  Filled 2017-11-15: qty 1

## 2017-11-15 MED ORDER — ENOXAPARIN SODIUM 40 MG/0.4ML ~~LOC~~ SOLN
40.0000 mg | SUBCUTANEOUS | Status: DC
  Administered 2017-11-15 – 2017-11-16 (×2): 40 mg via SUBCUTANEOUS
  Filled 2017-11-15 (×2): qty 0.4

## 2017-11-15 MED ORDER — ONDANSETRON HCL 4 MG/2ML IJ SOLN: 4.0000 mg | Freq: Four times a day (QID) | INTRAMUSCULAR | Status: DC | PRN

## 2017-11-15 MED ORDER — ASPIRIN 81 MG PO CHEW
324.0000 mg | CHEWABLE_TABLET | Freq: Once | ORAL | Status: AC
  Administered 2017-11-15: 324 mg via ORAL
  Filled 2017-11-15: qty 4

## 2017-11-15 MED ORDER — ATORVASTATIN CALCIUM 10 MG PO TABS
10.0000 mg | ORAL_TABLET | Freq: Every day | ORAL | Status: DC
  Administered 2017-11-15 – 2017-11-16 (×2): 10 mg via ORAL
  Filled 2017-11-15 (×2): qty 1

## 2017-11-15 MED ORDER — FOLIC ACID 1 MG PO TABS
1.0000 mg | ORAL_TABLET | Freq: Every day | ORAL | Status: DC
  Administered 2017-11-15 – 2017-11-16 (×2): 1 mg via ORAL
  Filled 2017-11-15 (×2): qty 1

## 2017-11-15 MED ORDER — CLOPIDOGREL BISULFATE 75 MG PO TABS
75.0000 mg | ORAL_TABLET | Freq: Every day | ORAL | Status: DC
  Administered 2017-11-15 – 2017-11-16 (×2): 75 mg via ORAL
  Filled 2017-11-15 (×2): qty 1

## 2017-11-15 MED ORDER — CARVEDILOL 3.125 MG PO TABS
3.1250 mg | ORAL_TABLET | Freq: Two times a day (BID) | ORAL | Status: DC
  Administered 2017-11-15 – 2017-11-16 (×3): 3.125 mg via ORAL
  Filled 2017-11-15 (×3): qty 1

## 2017-11-15 MED ORDER — PANTOPRAZOLE SODIUM 40 MG PO TBEC
40.0000 mg | DELAYED_RELEASE_TABLET | Freq: Every day | ORAL | Status: DC
  Administered 2017-11-15 – 2017-11-16 (×2): 40 mg via ORAL
  Filled 2017-11-15 (×2): qty 1

## 2017-11-15 MED ORDER — MORPHINE SULFATE (PF) 4 MG/ML IV SOLN
4.0000 mg | Freq: Once | INTRAVENOUS | Status: AC
  Administered 2017-11-15: 2 mg via INTRAVENOUS
  Filled 2017-11-15: qty 1

## 2017-11-15 NOTE — Consult Note (Signed)
CARDIOLOGY CONSULT NOTE       Patient IDManaal [name] MRN: [id number] DOB/AGE: 25-Oct-1940 76 y.o.  Admit date: 11/15/2017 Referring Physician: Tat Primary Physician: Dana Grippe, MD Primary Cardiologist: Dana [name] Reason for Consultation: Chest Pain  Principal Problem:   Chest pain Active Problems:   Hypertrophic cardiomyopathy (HCC)   HTN (hypertension)   Hyperlipidemia   Gout   Anemia   CAD (coronary artery disease)   Precordial chest pain   HPI:  77 y.o. history of CAD. DES to OM1 with jailed side branch  September 2018. Also history of HTN, HLD and HOCM.  Seen in March for SSCP with r/o and thought to be GI Rx medically Patient is Dana [name] and does not speak English History from son. She has had some weakness , dizziness and chest pain since Thursday pain not like previous pain before stent. Sometimes exertional Not related to food. No associated palpitations or syncope. In ER pain responded more to morphine nitrates made BP low Despite severe pain enzymes negative ECG with chronic changes of LVH and HOCM. Currently pain free in ICU  Compliant with meds Generally sedentary   ROS All other systems reviewed and negative except as noted above  Past Medical History:  Diagnosis Date  . CAD (coronary artery disease)    a. s/p DES to OM1 in 02/2017  . Essential hypertension   . Gout   . HOCM (hypertrophic obstructive cardiomyopathy) (HCC)   . Hyperlipidemia   . Rheumatoid arthritis (HCC)     Family History  Problem Relation Age of Onset  . Hypertension Mother   . Liver disease Mother   . Alcohol abuse Father     Social History   Socioeconomic History  . Marital status: Widowed    Spouse name: Not on file  . Number of children: Not on file  . Years of education: Not on file  . Highest education level: Not on file  Occupational History  . Not on file  Social Needs  . Financial resource strain: Not on file  . Food insecurity:    Worry: Not on file    Inability:  Not on file  . Transportation needs:    Medical: Not on file    Non-medical: Not on file  Tobacco Use  . Smoking status: Never Smoker  . Smokeless tobacco: Never Used  Substance and Sexual Activity  . Alcohol use: No  . Drug use: No  . Sexual activity: Not on file  Lifestyle  . Physical activity:    Days per week: Not on file    Minutes per session: Not on file  . Stress: Not on file  Relationships  . Social connections:    Talks on phone: Not on file    Gets together: Not on file    Attends religious service: Not on file    Active member of club or organization: Not on file    Attends meetings of clubs or organizations: Not on file    Relationship status: Not on file  . Intimate partner violence:    Fear of current or ex partner: Not on file    Emotionally abused: Not on file    Physically abused: Not on file    Forced sexual activity: Not on file  Other Topics Concern  . Not on file  Social History Narrative  . Not on file    Past Surgical History:  Procedure Laterality Date  . CORONARY BALLOON ANGIOPLASTY N/A 03/03/2017   Procedure: CORONARY BALLOON  ANGIOPLASTY;  Surgeon: Marykay Lex, MD;  Location: [facility] INVASIVE CV LAB;  Service: Cardiovascular;  Laterality: N/A;  . CORONARY STENT INTERVENTION N/A 03/03/2017   Procedure: CORONARY STENT INTERVENTION;  Surgeon: Marykay Lex, MD;  Location: [facility] INVASIVE CV LAB;  Service: Cardiovascular;  Laterality: N/A;  . LEFT HEART CATH AND CORONARY ANGIOGRAPHY N/A 03/02/2017   Procedure: LEFT HEART CATH AND CORONARY ANGIOGRAPHY;  Surgeon: Tonny Bollman, MD;  Location: [facility] INVASIVE CV LAB;  Service: Cardiovascular;  Laterality: N/A;     . aspirin EC  81 mg Oral Daily  . atorvastatin  10 mg Oral Daily  . clopidogrel  75 mg Oral Daily  . enoxaparin (LOVENOX) injection  40 mg Subcutaneous Q24H  . ezetimibe  10 mg Oral Daily  . fluticasone  2 spray Each Nare Daily  . folic acid  1 mg Oral Daily  . pantoprazole  40 mg Oral Daily      Physical Exam: Blood pressure (!) 88/75, pulse 79, temperature 97.9 F (36.6 C), temperature source Oral, resp. rate 20, height 5\' 2"  (1.575 m), weight 172 lb 13.5 oz (78.4 kg), SpO2 95 %.   Affect appropriate Overweight Dana [name] female  HEENT: normal Neck supple with no adenopathy JVP normal no bruits no thyromegaly Lungs clear with no wheezing and good diaphragmatic motion Heart:  S1/S2 no murmur, no rub, gallop or click PMI normal Abdomen: benighn, BS positve, no tenderness, no AAA no bruit.  No HSM or HJR Distal pulses intact with no bruits No edema Neuro non-focal Skin warm and dry No muscular weakness   Labs:   Lab Results  Component Value Date   WBC 5.5 11/15/2017   HGB 10.4 (L) 11/15/2017   HCT 32.9 (L) 11/15/2017   MCV 84.8 11/15/2017   PLT 272 11/15/2017    Recent Labs  Lab 11/15/17 0120  NA 139  K 3.7  CL 102  CO2 28  BUN 20  CREATININE 0.88  CALCIUM 9.1  GLUCOSE 108*   Lab Results  Component Value Date   TROPONINI <0.03 11/15/2017    Lab Results  Component Value Date   CHOL 123 09/17/2017   CHOL 112 01/30/2017   Lab Results  Component Value Date   HDL 49 09/17/2017   HDL 41 01/30/2017   Lab Results  Component Value Date   LDLCALC 60 09/17/2017   LDLCALC 56 01/30/2017   Lab Results  Component Value Date   TRIG 69 09/17/2017   TRIG 74 01/30/2017   Lab Results  Component Value Date   CHOLHDL 2.5 09/17/2017   CHOLHDL 2.7 01/30/2017   No results found for: LDLDIRECT    Radiology: Dg Chest 2 View  Result Date: 11/15/2017 CLINICAL DATA:  Mid chest pain. EXAM: CHEST - 2 VIEW COMPARISON:  Radiograph 09/17/2027.  CT 01/29/2017 FINDINGS: The cardiomediastinal contours are normal. Aortic atherosclerosis and tortuosity. Pulmonary vasculature is normal. No consolidation, pleural effusion, or pneumothorax. No acute osseous abnormalities are seen. IMPRESSION: 1. No acute findings. 2.  Aortic Atherosclerosis (ICD10-I70.0). Electronically  Signed   By: 03/31/2017 M.D.   On: 11/15/2017 01:47    EKG: SR LVH no acute changes    ASSESSMENT AND PLAN:  Chest Pain:  History of OM stent September 2018 Admit for r/o March 2019. Despite prolonged pain enzymes negative Discussed options with son Pain may actually be non cardiac or more from HOCM> Discussed transfer to [facility] for cath or adjusting medical Rx and favors the latter Increase verapamil and  start beta blocker Try to avoid nitrates as they tend to make HOCM patients hypotensive Can consider adding disopyramide as outpatient. If she has more pain with ambulation today will transfer to [facility] for cath tomorrow  HTN:  Well controlled.  Continue current medications and low sodium Dash type diet.     HOCM:  Prominent murmur on exam moderate gradients by echo see above for medication adjustment Given age and obesity would consider septal alcohol ablation if gradients become severe   GERD:  May contribute to her pain continue protonix  Signed: Charlton Haws 11/15/2017, 8:15 AM

## 2017-11-15 NOTE — H&P (Signed)
History and Physical    Dana [name] RXV:[id number] DOB: 1941-01-23 DOA: 11/15/2017  PCP: [name], MD   Patient coming from: Home.  I have personally briefly reviewed patient's old medical records in [facility] Link  Chief Complaint: Chest pain  HPI: Dana [name] is a 77 y.o. female with medical history significant of osteoarthritis, essential hypertension, gout, hypertrophic obstructive cardiomyopathy, hyperlipidemia, CAD who underwent cardiac catheterization in September 2019 due to chest pain which revealed a single vessel obstructive disease involving a large first obtuse marginal with otherwise moderate LAD stenosis, mild to moderate left main stenosis, and a small nondominant RCA. This was treated with DES to the first obtuse marginal and medical treatment.  She was admitted overnight at [facility] due to chest pain on 09/17/2017 and had not had any significant chest pain complaints until 3 days ago when she started pressure-like chest pain, associated with mild dyspnea, palpitations, but denies nausea, emesis, dizziness or diaphoresis.  No PND, orthopnea or recent lower extremities edema.  The pain episodes have been responding relatively well to NTG, until yesterday evening, when she developed intense pressure-like left-sided chest pain, radiated to her right sided chest, rated at a 8-9 out of 10.did not get relieved as much as it has been relieving over the past recent days with SL nitroglycerin.  No fever, chills, sore throat, cough, wheezing, hemoptysis, abdominal pain, diarrhea, constipation, melena or hematochezia.  No dysuria, frequency or hematuria.  No heat or cold intolerance.  No polyuria, polydipsia, polyphagia or blurred vision.  ED Course: Initial vital signs temperature 97.7 F, pulse 78, respirations 27, blood pressure 123/69 mmHg and O2 sat 98% on room air.  He received 324 mg of chewable aspirin and 4 mg of morphine in the ED.  Her pain decreased to 3-4 out of 10 after this.   However, her blood pressure decreased with a systolic ranging from the mid 50D to mid 100s.  Her white count was 5.5 with a normal differential.  Hemoglobin 10.4 g/dL and platelets 326.  BMP shows a sodium 139, potassium 3.7, chloride 102 and CO2 28 mmol/L.  BUN 20, creatinine 0.8 a, calcium 9.1 and glucose 180 mg/dL.  Her EKG was sinus rhythm with LVH, mildly prolonged QT segment, but not significantly changed from previous.  First troponin level was normal.  Her chest radiograph show aortic atherosclerosis, but no acute findings.  Review of Systems: As per HPI otherwise 10 point review of systems negative.   Past Medical History:  Diagnosis Date  . Arthritis   . CAD (coronary artery disease)    a. s/p DES to OM1 in 02/2017  . Essential hypertension   . Gout   . HOCM (hypertrophic obstructive cardiomyopathy) (HCC)   . Hyperlipidemia     Past Surgical History:  Procedure Laterality Date  . CORONARY BALLOON ANGIOPLASTY N/A 03/03/2017   Procedure: CORONARY BALLOON ANGIOPLASTY;  Surgeon: Marykay Lex, MD;  Location: [facility] INVASIVE CV LAB;  Service: Cardiovascular;  Laterality: N/A;  . CORONARY STENT INTERVENTION N/A 03/03/2017   Procedure: CORONARY STENT INTERVENTION;  Surgeon: Marykay Lex, MD;  Location: [facility] INVASIVE CV LAB;  Service: Cardiovascular;  Laterality: N/A;  . LEFT HEART CATH AND CORONARY ANGIOGRAPHY N/A 03/02/2017   Procedure: LEFT HEART CATH AND CORONARY ANGIOGRAPHY;  Surgeon: Tonny Bollman, MD;  Location: [facility] INVASIVE CV LAB;  Service: Cardiovascular;  Laterality: N/A;     reports that she has never smoked. She has never used smokeless tobacco. She reports that she does  not drink alcohol or use drugs.  No Known Allergies  Family History  Problem Relation Age of Onset  . Hypertension Mother   . Liver disease Mother   . Alcohol abuse Father     Prior to Admission medications   Medication Sig Start Date End Date Taking? Authorizing Provider  allopurinol (ZYLOPRIM) 300  MG tablet Take 300 mg by mouth daily as needed (for gout).    Yes [provider]  aspirin EC 81 MG tablet Take 81 mg by mouth daily.   Yes [provider]  atorvastatin (LIPITOR) 10 MG tablet Take 10 mg by mouth daily.   Yes [provider]  clopidogrel (PLAVIX) 75 MG tablet TAKE 1 TABLET BY MOUTH ONCE DAILY. 11/08/17  Yes Jonelle Sidle, MD  ezetimibe (ZETIA) 10 MG tablet Take 10 mg by mouth daily.   Yes [provider]  fluticasone (FLONASE) 50 MCG/ACT nasal spray Place 2 sprays into both nostrils daily.   Yes [provider]  folic acid (FOLVITE) 1 MG tablet Take 1 mg by mouth daily.   Yes [provider]  furosemide (LASIX) 40 MG tablet Take 40 mg by mouth daily as needed for fluid.    Yes [provider]  methotrexate 2.5 MG tablet Take 15 mg by mouth once a week. On Tuesday   Yes [provider]  metoprolol (LOPRESSOR) 100 MG tablet Take 100 mg by mouth 2 (two) times daily.   Yes [provider]  nitroGLYCERIN (NITROSTAT) 0.4 MG SL tablet Place 1 tablet (0.4 mg total) under the tongue every 5 (five) minutes as needed for chest pain. 03/04/17  Yes Sharol Harness, Brittainy M, PA-C  pantoprazole (PROTONIX) 40 MG tablet Take 40 mg by mouth daily.   Yes [provider]  RESTASIS MULTIDOSE 0.05 % ophthalmic emulsion PLACE ONE DROP IN BOTH EYES TWICE DAILY 12/26/16  Yes [provider]  verapamil (CALAN-SR) 120 MG CR tablet Take 1 tablet (120 mg total) by mouth daily. 01/30/17  Yes Rodolph Bong, MD    Physical Exam: Vitals:   11/15/17 0200 11/15/17 0206 11/15/17 0215 11/15/17 0230  BP: 98/75 102/72 108/71 96/66  Pulse: 87 87 83 81  Resp: 19 19 19 18   Temp:      TempSrc:      SpO2: 100% 99% 98% 99%  Weight:      Height:        Constitutional: NAD, calm, comfortable Eyes: PERRL, lids and conjunctivae normal ENMT: Mucous membranes are moist. Posterior pharynx clear of any exudate or  lesions. Neck: normal, supple, no masses, no thyromegaly Respiratory: clear to auscultation bilaterally, no wheezing, no crackles. Normal respiratory effort. No accessory muscle use.  Cardiovascular: Regular rate and rhythm, 4/6 HSM, no rubs / gallops. No extremity edema. 2+ pedal pulses. No carotid bruits.  Abdomen: Soft, no tenderness, no masses palpated. No hepatosplenomegaly. Bowel sounds positive.  Musculoskeletal: no clubbing / cyanosis.  Good ROM, no contractures. Normal muscle tone.  Skin: Multiple hyperpigmented macules and patches of trunk and extremities. Neurologic: CN 2-12 grossly intact. Sensation intact, DTR normal. Strength 5/5 in all 4.  Psychiatric: Normal judgment and insight. Alert and oriented x 3. Normal mood.    Labs on Admission: I have personally reviewed following labs and imaging studies  CBC: Recent Labs  Lab 11/15/17 0120  WBC 5.5  NEUTROABS 3.4  HGB 10.4*  HCT 32.9*  MCV 84.8  PLT 272   Basic Metabolic Panel: Recent Labs  Lab  11/15/17 0120  NA 139  K 3.7  CL 102  CO2 28  GLUCOSE 108*  BUN 20  CREATININE 0.88  CALCIUM 9.1   GFR: Estimated Creatinine Clearance: 56.2 mL/min (by C-G formula based on SCr of 0.88 mg/dL). Liver Function Tests: No results for input(s): AST, ALT, ALKPHOS, BILITOT, PROT, ALBUMIN in the last 168 hours. No results for input(s): LIPASE, AMYLASE in the last 168 hours. No results for input(s): AMMONIA in the last 168 hours. Coagulation Profile: No results for input(s): INR, PROTIME in the last 168 hours. Cardiac Enzymes: Recent Labs  Lab 11/15/17 0120  TROPONINI <0.03   BNP (last 3 results) No results for input(s): PROBNP in the last 8760 hours. HbA1C: No results for input(s): HGBA1C in the last 72 hours. CBG: No results for input(s): GLUCAP in the last 168 hours. Lipid Profile: No results for input(s): CHOL, HDL, LDLCALC, TRIG, CHOLHDL, LDLDIRECT in the last 72 hours. Thyroid Function Tests: No results for  input(s): TSH, T4TOTAL, FREET4, T3FREE, THYROIDAB in the last 72 hours. Anemia Panel: No results for input(s): VITAMINB12, FOLATE, FERRITIN, TIBC, IRON, RETICCTPCT in the last 72 hours. Urine analysis: No results found for: COLORURINE, APPEARANCEUR, LABSPEC, PHURINE, GLUCOSEU, HGBUR, BILIRUBINUR, KETONESUR, PROTEINUR, UROBILINOGEN, NITRITE, LEUKOCYTESUR  Radiological Exams on Admission: Dg Chest 2 View  Result Date: 11/15/2017 CLINICAL DATA:  Mid chest pain. EXAM: CHEST - 2 VIEW COMPARISON:  Radiograph 09/17/2027.  CT 01/29/2017 FINDINGS: The cardiomediastinal contours are normal. Aortic atherosclerosis and tortuosity. Pulmonary vasculature is normal. No consolidation, pleural effusion, or pneumothorax. No acute osseous abnormalities are seen. IMPRESSION: 1. No acute findings. 2.  Aortic Atherosclerosis (ICD10-I70.0). Electronically Signed   By: Rubye Oaks M.D.   On: 11/15/2017 01:47   09/18/2017 echocardiogram complete ------------------------------------------------------------------- LV EF: 75% -   80%  ------------------------------------------------------------------- Indications:      Chest pain 786.51.  ------------------------------------------------------------------- History:   PMH:   Coronary artery disease.  Risk factors:  HOCM. Hypertension. Dyslipidemia.  ------------------------------------------------------------------- Study Conclusions  - Left ventricle: The cavity size was normal. There was moderate   asymmetric hypertrophy of the septum. The appearance was   consistent with hypertrophic cardiomyopathy. Systolic function   was hyperdynamic. The estimated ejection fraction was in the   range of 75% to 80%. There was dynamic obstruction at rest in the   outflow tract, with a peak velocity of 330 cm/sec and a peak   gradient of 44 mm Hg. The gradient increases to 66 mm Hg with the   Valsalva maneuver. Wall motion was normal; there were no regional   wall  motion abnormalities. Features are consistent with a   pseudonormal left ventricular filling pattern, with concomitant   abnormal relaxation and increased filling pressure (grade 2   diastolic dysfunction). - Mitral valve: Calcified annulus. There was mild systolic anterior   motion of the anterior leaflet. There was mild to moderate   regurgitation directed eccentrically and posteriorly. - Left atrium: The atrium was mildly dilated. - Pulmonary arteries: PA peak pressure: 31 mm Hg (S).  EKG: Independently reviewed.  Vent. rate 78 BPM PR interval * ms QRS duration 95 ms QT/QTc 442/504 ms P-R-T axes 16 19 177 Sinus rhythm Abnormal R-wave progression, early transition LVH with secondary repolarization abnormality Anterior Q waves, possibly due to LVH Prolonged QT interval  Assessment/Plan Principal Problem:   Chest pain Per patient, pain is similar to when she had ACS. She has been using NTG at home often over the past couple days. NTG is  the only thing in the partially relieves the pain, but continues to rated at a 4 out of 10. This is happening despite being given morphine 4 mg IVP x1 dose. I will continue supplemental oxygen and PRN sublingual nitroglycerin. NTP to ACW is a consideration, but will deferred for now due to hypotension. Continue cardiac telemetry monitoring. Trend troponin level. Routine cardiology consult later today.  Active Problems:   CAD (coronary artery disease) Continue aspirin, atorvastatin, clopidogrel and Zetia. Resume beta-blocker and calcium channel blocker, if BP rises.    Hypertrophic cardiomyopathy (HCC) Continue Lopressor 100 mg p.o. twice daily.    HTN (hypertension) On Lopressor 100 p.o. twice daily. On verapamil SR 120 mg p.o. daily. Takes furosemide as needed for edema. Antihypertensive medications have been held due to hypotension following morphine IV. Follow-up blood pressure, heart rate, renal function and electrolytes. Adjust  treatment as needed.    Hyperlipidemia Continue atorvastatin 10 mg p.o. daily. Monitor LFTs as needed.    Gout Continue allopurinol 300 mg p.o. daily.    Anemia Anemia panel last year revealed low iron and saturation ratio. May benefit from iron supplementation.    DVT prophylaxis: Lovenox SQ. Code Status: Full code. Family Communication: Her son-in-law Jonny Ruiz helped translate Disposition Plan: 24 to 48-hour observation for chest pain. Consults called: Routine cardiology consult. Admission status: Observation/telemetry.   Bobette Mo MD [facility] Pager [phone].  If 7PM-7AM, please contact night-coverage [email or web address] Password [facility]  11/15/2017, 2:59 AM   This document was prepared using Dragon voice recognition software and may contain some unintended transcription errors.

## 2017-11-15 NOTE — ED Notes (Signed)
Patient transported to X-ray 

## 2017-11-15 NOTE — Care Management Obs Status (Signed)
MEDICARE OBSERVATION STATUS NOTIFICATION   Patient Details  Name: Dana [name] MRN: [id number] Date of Birth: Nov 13, 1940   Medicare Observation Status Notification Given:  Yes    Senie Lanese, Chrystine Oiler, RN 11/15/2017, 11:01 AM

## 2017-11-15 NOTE — ED Provider Notes (Addendum)
[facility] EMERGENCY DEPARTMENT Provider Note   CSN: [id number] Arrival date & time: 11/15/17  0031     History   Chief Complaint Chief Complaint  Patient presents with  . Chest Pain    HPI Dana [name] is a 77 y.o. female.  The history is provided by the patient. A language interpreter was used.  She has history of hypertension, hyperlipidemia, coronary artery disease status post stent placement, hypertrophic obstructive cardiomyopathy and comes in with an episode of chest pain.  She actually started having chest pains 2 days ago, but had pain tonight which occurred while watching television.  She had partial relief with nitroglycerin.  There is no associated dyspnea, nausea, diaphoresis.  Pain is currently rated at 6/10.  She had also used nitroglycerin 2 days ago, but had complete relief of pain at that time.  She states symptoms are similar to what she had prior to stent placement.  She has not taken any aspirin today.  Nothing makes the pain worse.  Nothing except the nitroglycerin made it any better.  Past Medical History:  Diagnosis Date  . Arthritis   . CAD (coronary artery disease)    a. s/p DES to OM1 in 02/2017  . Essential hypertension   . Gout   . HOCM (hypertrophic obstructive cardiomyopathy) (HCC)   . Hyperlipidemia     Patient Active Problem List   Diagnosis Date Noted  . CAD (coronary artery disease) 09/17/2017  . HOCM (hypertrophic obstructive cardiomyopathy) (HCC)   . Unstable angina (HCC)   . Coronary artery disease involving native coronary artery of native heart with unstable angina pectoris (HCC)   . Chest pain 01/29/2017  . Hypertrophic cardiomyopathy (HCC) 01/29/2017  . MR (mitral regurgitation) 01/29/2017  . HTN (hypertension) 01/29/2017  . Hyperlipidemia 01/29/2017  . Gout 01/29/2017  . Arthritis 01/29/2017  . Anemia 01/29/2017    Past Surgical History:  Procedure Laterality Date  . CORONARY BALLOON ANGIOPLASTY N/A 03/03/2017   Procedure:  CORONARY BALLOON ANGIOPLASTY;  Surgeon: Marykay Lex, MD;  Location: [facility] INVASIVE CV LAB;  Service: Cardiovascular;  Laterality: N/A;  . CORONARY STENT INTERVENTION N/A 03/03/2017   Procedure: CORONARY STENT INTERVENTION;  Surgeon: Marykay Lex, MD;  Location: [facility] INVASIVE CV LAB;  Service: Cardiovascular;  Laterality: N/A;  . LEFT HEART CATH AND CORONARY ANGIOGRAPHY N/A 03/02/2017   Procedure: LEFT HEART CATH AND CORONARY ANGIOGRAPHY;  Surgeon: Tonny Bollman, MD;  Location: [facility] INVASIVE CV LAB;  Service: Cardiovascular;  Laterality: N/A;     OB History    Gravida  7   Para      Term      Preterm      AB      Living  7     SAB      TAB      Ectopic      Multiple      Live Births               Home Medications    Prior to Admission medications   Medication Sig Start Date End Date Taking? Authorizing Provider  allopurinol (ZYLOPRIM) 300 MG tablet Take 300 mg by mouth daily as needed (for gout).     [provider]  aspirin EC 81 MG tablet Take 81 mg by mouth daily.    [provider]  atorvastatin (LIPITOR) 10 MG tablet Take 10 mg by mouth daily.    [provider]  clopidogrel (PLAVIX) 75 MG tablet TAKE 1  TABLET BY MOUTH ONCE DAILY. 11/08/17   Jonelle Sidle, MD  ezetimibe (ZETIA) 10 MG tablet Take 10 mg by mouth daily.    [provider]  fluticasone (FLONASE) 50 MCG/ACT nasal spray Place 2 sprays into both nostrils daily.    [provider]  folic acid (FOLVITE) 1 MG tablet Take 1 mg by mouth daily.    [provider]  furosemide (LASIX) 40 MG tablet Take 40 mg by mouth daily as needed for fluid.     [provider]  methotrexate 2.5 MG tablet Take 15 mg by mouth once a week. On Tuesday    [provider]  metoprolol (LOPRESSOR) 100 MG tablet Take 100 mg by mouth 2 (two) times daily.    [provider]  nitroGLYCERIN (NITROSTAT) 0.4 MG SL tablet Place 1 tablet (0.4 mg total)  under the tongue every 5 (five) minutes as needed for chest pain. 03/04/17   Robbie Lis M, PA-C  pantoprazole (PROTONIX) 40 MG tablet Take 40 mg by mouth daily.    [provider]  RESTASIS MULTIDOSE 0.05 % ophthalmic emulsion PLACE ONE DROP IN BOTH EYES TWICE DAILY 12/26/16   [provider]  verapamil (CALAN-SR) 120 MG CR tablet Take 1 tablet (120 mg total) by mouth daily. 01/30/17   Rodolph Bong, MD    Family History Family History  Problem Relation Age of Onset  . Hypertension Mother   . Liver disease Mother   . Alcohol abuse Father     Social History Social History   Tobacco Use  . Smoking status: Never Smoker  . Smokeless tobacco: Never Used  Substance Use Topics  . Alcohol use: No  . Drug use: No     Allergies   Patient has no known allergies.   Review of Systems Review of Systems  All other systems reviewed and are negative.    Physical Exam Updated Vital Signs BP 123/69   Pulse 78   Temp 97.7 F (36.5 C) (Oral)   Resp (!) 27   Ht 5\' 3"  (1.6 m)   Wt 82.6 kg (182 lb)   SpO2 98%   BMI 32.24 kg/m   Physical Exam  Nursing note and vitals reviewed.  77 year old female, resting comfortably and in no acute distress. Vital signs are significant for elevated respiratory rate. Oxygen saturation is 98%, which is normal. Head is normocephalic and atraumatic. PERRLA, EOMI. Oropharynx is clear. Neck is nontender and supple without adenopathy or JVD. Back is nontender and there is no CVA tenderness. Lungs are clear without rales, wheezes, or rhonchi. Chest is nontender. Heart has regular rate and rhythm with 4/6 systolic ejection murmur best heard at the aortic area. Abdomen is soft, flat, nontender without masses or hepatosplenomegaly and peristalsis is normoactive. Extremities have no cyanosis or edema, full range of motion is present. Skin is warm and dry without rash. Neurologic: Mental status is normal, cranial nerves are intact,  there are no motor or sensory deficits.  ED Treatments / Results  Labs (all labs ordered are listed, but only abnormal results are displayed) Labs Reviewed  CBC WITH DIFFERENTIAL/PLATELET - Abnormal; Notable for the following components:      Result Value   Hemoglobin 10.4 (*)    HCT 32.9 (*)    RDW 15.6 (*)    All other components within normal limits  BASIC METABOLIC PANEL - Abnormal; Notable for the following components:   Glucose, Bld 108 (*)  All other components within normal limits  TROPONIN I  TROPONIN I  TROPONIN I  MAGNESIUM  PHOSPHORUS    EKG EKG Interpretation  Date/Time:  Wednesday Nov 15 2017 00:43:52 EDT Ventricular Rate:  78 PR Interval:    QRS Duration: 95 QT Interval:  442 QTC Calculation: 504 R Axis:   19 Text Interpretation:  Sinus rhythm Abnormal R-wave progression, early transition LVH with secondary repolarization abnormality Anterior Q waves, possibly due to LVH Prolonged QT interval When compared with ECG of 09/16/2017, No significant change was found Note: ECG on 09/17/2017 has a different QRS morphology in anterior leads than all other ECG's, so was not used for comparison Confirmed by Dione Booze ([id number]) on 11/15/2017 12:49:34 AM   Radiology Dg Chest 2 View  Result Date: 11/15/2017 CLINICAL DATA:  Mid chest pain. EXAM: CHEST - 2 VIEW COMPARISON:  Radiograph 09/17/2027.  CT 01/29/2017 FINDINGS: The cardiomediastinal contours are normal. Aortic atherosclerosis and tortuosity. Pulmonary vasculature is normal. No consolidation, pleural effusion, or pneumothorax. No acute osseous abnormalities are seen. IMPRESSION: 1. No acute findings. 2.  Aortic Atherosclerosis (ICD10-I70.0). Electronically Signed   By: Rubye Oaks M.D.   On: 11/15/2017 01:47    Procedures Procedures  Medications Ordered in ED Medications  nitroGLYCERIN (NITROSTAT) SL tablet 0.4 mg (0.4 mg Sublingual Given 11/15/17 0113)  aspirin chewable tablet 324 mg (324 mg Oral Given  11/15/17 0112)  morphine 4 MG/ML injection 4 mg (2 mg Intravenous Given 11/15/17 0211)     Initial Impression / Assessment and Plan / ED Course  I have reviewed the triage vital signs and the nursing notes.  Pertinent labs & imaging results that were available during my care of the patient were reviewed by me and considered in my medical decision making (see chart for details).  Chest pain in patient with known history of coronary artery disease.  Old records are reviewed confirming stent placed in obtuse marginal branch of left circumflex artery and September 2018.  40-50% lesions were present in the left main, left anterior descending, and obtuse marginal arteries.  She is given aspirin and nitroglycerin.  Pain is likely noncardiac given relatively recent catheterization with no areas of myocardium at risk, and no ECG findings suggestive of stent occlusion.  However, given her history, serial troponin measurements would be reasonable.  There is some improvement with nitroglycerin, but blood pressure dropped.  She was given morphine which has not really affected the pain.  Initial troponin is normal.  Remainder of laboratory work does show mild anemia which is unchanged from baseline.  Case is discussed with Dr. Robb Matar of [facility], who agrees to admit the patient for serial troponins.  Final Clinical Impressions(s) / ED Diagnoses   Final diagnoses:  Precordial chest pain  Normochromic normocytic anemia    ED Discharge Orders    None       Dione Booze, MD 11/15/17 1655    Dione Booze, MD 11/15/17 [phone]

## 2017-11-15 NOTE — ED Notes (Signed)
pain location now has moved from left chest to across the top of chest. Dr Preston Fleeting aware- the remaining 2mg  of the morphine ordered given at this time.

## 2017-11-15 NOTE — ED Notes (Signed)
Dr Ortiz at bedside 

## 2017-11-15 NOTE — Progress Notes (Signed)
PROGRESS NOTE  Dana [name] LMB:[id number] DOB: Nov 07, 1940 DOA: 11/15/2017 PCP: [name], MD  Brief History:  77 year old female with a history of coronary artery disease status post DES to OM1 March 03, 2017 presenting with 2 to 3-day history of intermittent chest discomfort.  The patient describes it as a chest tightness with some associated dizziness.  She states that she has been having chest tightness associated with walking back and forth from the bathroom, but also having some chest discomfort at rest.  She denies any nausea, vomiting, shortness of breath, syncope.  The patient states that she has been taking nitroglycerin with relief of her chest discomfort.  She denies any fevers, chills, headache, neck pain, nausea, vomiting, diarrhea, abdominal pain, dysuria.  The patient was recently admitted from 09/16/2017 through 09/18/2017 for atypical chest pain which was thought to be due to GI etiology.  The patient states that she took a dose of furosemide 40 mg on 11/14/2017.  Assessment/Plan: Chest pain -Patient has typical and atypical symptoms by clinical history -Cardiology consult -cycle troponins -Personally reviewed EKG--sinus rhythm, LVH changes, IVCD -Personally reviewed chest x-ray--no pulmonary edema or consolidation -09/18/2017 echo EF 75-80%, grade 2 DD, dynamic outflow tract obstruction  Coronary artery disease -DES placed to the OM1 03/13/2017; PTCA angioplasty lateral first marginal -Continue aspirin and Plavix  Hypertrophic obstructive cardiomyopathy -per cardiology -suspect lasix may have caused some volume depletion-->soft BP  Essential hypertension -Holding metoprolol tartrate and verapamil secondary to soft blood pressure  Hyperlipidemia -Continue statin and Zetia  Rheumatoid arthritis -Patient takes methotrexate 15 mg every Tuesday  Gouty arthritis -Continue allopurinol    Disposition Plan:   Home when cleared by cardiology Family  Communication:   Son-in-law updated at bedside 5/22--total time 58 min--0  Consultants:  cardiology  Code Status:  FULL  DVT Prophylaxis:   [place] Lovenox   Procedures: As Listed in Progress Note Above  Antibiotics: None    Subjective: Patient denies fevers, chills, headache, chest pain, dyspnea, nausea, vomiting, diarrhea, abdominal pain, dysuria, hematuria, hematochezia, and melena.   Objective: Vitals:   11/15/17 0414 11/15/17 0500 11/15/17 0600 11/15/17 0700  BP: 106/72 100/76 97/69 (!) 88/75  Pulse: 80 85 80 79  Resp: 19 (!) 25 20 20   Temp: 97.9 F (36.6 C)     TempSrc: Oral     SpO2: 97% 94% 92% 95%  Weight: 78.4 kg (172 lb 13.5 oz)     Height: 5\' 2"  (1.575 m)      No intake or output data in the 24 hours ending 11/15/17 0754 Weight change:  Exam:   General:  Pt is alert, follows commands appropriately, not in acute distress  HEENT: No icterus, No thrush, No neck mass, [place]/AT  Cardiovascular: RRR, S1/S2, no rubs, no gallops--2/6 systolic murmur LLSB  Respiratory: CTA bilaterally, no wheezing, no crackles, no rhonchi  Abdomen: Soft/+BS, non tender, non distended, no guarding  Extremities: No edema, No lymphangitis, No petechiae, No rashes, no synovitis   Data Reviewed: I have personally reviewed following labs and imaging studies Basic Metabolic Panel: Recent Labs  Lab 11/15/17 0120  NA 139  K 3.7  CL 102  CO2 28  GLUCOSE 108*  BUN 20  CREATININE 0.88  CALCIUM 9.1  MG 2.0  PHOS 5.5*   Liver Function Tests: No results for input(s): AST, ALT, ALKPHOS, BILITOT, PROT, ALBUMIN in the last 168 hours. No results for input(s): LIPASE, AMYLASE in the last 168  hours. No results for input(s): AMMONIA in the last 168 hours. Coagulation Profile: No results for input(s): INR, PROTIME in the last 168 hours. CBC: Recent Labs  Lab 11/15/17 0120  WBC 5.5  NEUTROABS 3.4  HGB 10.4*  HCT 32.9*  MCV 84.8  PLT 272   Cardiac Enzymes: Recent Labs  Lab  11/15/17 0120  TROPONINI <0.03   BNP: Invalid input(s): POCBNP CBG: No results for input(s): GLUCAP in the last 168 hours. HbA1C: No results for input(s): HGBA1C in the last 72 hours. Urine analysis: No results found for: COLORURINE, APPEARANCEUR, LABSPEC, PHURINE, GLUCOSEU, HGBUR, BILIRUBINUR, KETONESUR, PROTEINUR, UROBILINOGEN, NITRITE, LEUKOCYTESUR Sepsis Labs: @LABRCNTIP (procalcitonin:4,lacticidven:4) )No results found for this or any previous visit (from the past 240 hour(s)).   Scheduled Meds: . aspirin EC  81 mg Oral Daily  . atorvastatin  10 mg Oral Daily  . clopidogrel  75 mg Oral Daily  . enoxaparin (LOVENOX) injection  40 mg Subcutaneous Q24H  . ezetimibe  10 mg Oral Daily  . fluticasone  2 spray Each Nare Daily  . folic acid  1 mg Oral Daily  . pantoprazole  40 mg Oral Daily   Continuous Infusions:  Procedures/Studies: Dg Chest 2 View  Result Date: 11/15/2017 CLINICAL DATA:  Mid chest pain. EXAM: CHEST - 2 VIEW COMPARISON:  Radiograph 09/17/2027.  CT 01/29/2017 FINDINGS: The cardiomediastinal contours are normal. Aortic atherosclerosis and tortuosity. Pulmonary vasculature is normal. No consolidation, pleural effusion, or pneumothorax. No acute osseous abnormalities are seen. IMPRESSION: 1. No acute findings. 2.  Aortic Atherosclerosis (ICD10-I70.0). Electronically Signed   By: 03/31/2017 M.D.   On: 11/15/2017 01:47    11/17/2017, DO  [facility] Pager [phone]  If 7PM-7AM, please contact night-coverage [email or web address] Password [facility] 11/15/2017, 7:54 AM   LOS: 0 days

## 2017-11-15 NOTE — ED Triage Notes (Signed)
Pt c/o chest pain and weakness that started x 3 days ago; pt denies any radiation to arms, shoulders or back; pt took a nitro around 2230 tonight; pt states the nitro did help with the pain

## 2017-11-16 DIAGNOSIS — I2510 Atherosclerotic heart disease of native coronary artery without angina pectoris: Secondary | ICD-10-CM | POA: Diagnosis not present

## 2017-11-16 DIAGNOSIS — I10 Essential (primary) hypertension: Secondary | ICD-10-CM

## 2017-11-16 DIAGNOSIS — R079 Chest pain, unspecified: Secondary | ICD-10-CM | POA: Diagnosis not present

## 2017-11-16 DIAGNOSIS — R072 Precordial pain: Secondary | ICD-10-CM | POA: Diagnosis not present

## 2017-11-16 DIAGNOSIS — E782 Mixed hyperlipidemia: Secondary | ICD-10-CM | POA: Diagnosis not present

## 2017-11-16 DIAGNOSIS — R0789 Other chest pain: Secondary | ICD-10-CM | POA: Diagnosis not present

## 2017-11-16 DIAGNOSIS — I422 Other hypertrophic cardiomyopathy: Secondary | ICD-10-CM | POA: Diagnosis not present

## 2017-11-16 MED ORDER — CARVEDILOL 3.125 MG PO TABS: 6.2500 mg | ORAL_TABLET | Freq: Two times a day (BID) | ORAL | Status: DC

## 2017-11-16 MED ORDER — CARVEDILOL 6.25 MG PO TABS: 6.2500 mg | ORAL_TABLET | Freq: Two times a day (BID) | ORAL | 1 refills | Status: DC

## 2017-11-16 MED ORDER — VERAPAMIL HCL ER 240 MG PO TBCR: 240.0000 mg | EXTENDED_RELEASE_TABLET | Freq: Every day | ORAL | 1 refills | Status: DC

## 2017-11-16 NOTE — Progress Notes (Signed)
Subjective:  Denies SSCP, palpitations or Dyspnea   Objective:  Vitals:   11/16/17 0500 11/16/17 0600 11/16/17 0700 11/16/17 0834  BP:  (!) 124/59 117/68 107/72  Pulse:  81 84 84  Resp:  (!) 24 20 (!) 24  Temp:      TempSrc:      SpO2:  92% 97% 95%  Weight: 173 lb 4.5 oz (78.6 kg)     Height:        Intake/Output from previous day:  Intake/Output Summary (Last 24 hours) at 11/16/2017 3568 Last data filed at 11/16/2017 0800 Gross per 24 hour  Intake 2240 ml  Output -  Net 2240 ml    Physical Exam: Affect appropriate Overweight [place] female  HEENT: normal Neck supple with no adenopathy JVP normal no bruits no thyromegaly Lungs clear with no wheezing and good diaphragmatic motion Heart:  S1/S2 HOCM murmur, no rub, gallop or click PMI normal Abdomen: benighn, BS positve, no tenderness, no AAA no bruit.  No HSM or HJR Distal pulses intact with no bruits No edema Neuro non-focal Skin warm and dry No muscular weakness   Lab Results: Basic Metabolic Panel: Recent Labs    11/15/17 0120  NA 139  K 3.7  CL 102  CO2 28  GLUCOSE 108*  BUN 20  CREATININE 0.88  CALCIUM 9.1  MG 2.0  PHOS 5.5*   CBC: Recent Labs    11/15/17 0120  WBC 5.5  NEUTROABS 3.4  HGB 10.4*  HCT 32.9*  MCV 84.8  PLT 272   Cardiac Enzymes: Recent Labs    11/15/17 0120 11/15/17 0757 11/15/17 1317  TROPONINI <0.03 <0.03 <0.03    Imaging: Dg Chest 2 View  Result Date: 11/15/2017 CLINICAL DATA:  Mid chest pain. EXAM: CHEST - 2 VIEW COMPARISON:  Radiograph 09/17/2027.  CT 01/29/2017 FINDINGS: The cardiomediastinal contours are normal. Aortic atherosclerosis and tortuosity. Pulmonary vasculature is normal. No consolidation, pleural effusion, or pneumothorax. No acute osseous abnormalities are seen. IMPRESSION: 1. No acute findings. 2.  Aortic Atherosclerosis (ICD10-I70.0). Electronically Signed   By: Rubye Oaks M.D.   On: 11/15/2017 01:47    Cardiac Studies:  ECG: SR  LVH  Telemetry:  NSR 11/16/2017    Echo: Study Conclusions  - Left ventricle: The cavity size was normal. There was moderate   asymmetric hypertrophy of the septum. The appearance was   consistent with hypertrophic cardiomyopathy. Systolic function   was hyperdynamic. The estimated ejection fraction was in the   range of 75% to 80%. There was dynamic obstruction at rest in the   outflow tract, with a peak velocity of 330 cm/sec and a peak   gradient of 44 mm Hg. The gradient increases to 66 mm Hg with the   Valsalva maneuver. Wall motion was normal; there were no regional   wall motion abnormalities. Features are consistent with a   pseudonormal left ventricular filling pattern, with concomitant   abnormal relaxation and increased filling pressure (grade 2   diastolic dysfunction). - Mitral valve: Calcified annulus. There was mild systolic anterior   motion of the anterior leaflet. There was mild to moderate   regurgitation directed eccentrically and posteriorly. - Left atrium: The atrium was mildly dilated. - Pulmonary arteries: PA peak pressure: 31 mm Hg (S).  Medications:   . aspirin EC  81 mg Oral Daily  . atorvastatin  10 mg Oral Daily  . carvedilol  3.125 mg Oral BID WC  . clopidogrel  75 mg  Oral Daily  . enoxaparin (LOVENOX) injection  40 mg Subcutaneous Q24H  . ezetimibe  10 mg Oral Daily  . fluticasone  2 spray Each Nare Daily  . folic acid  1 mg Oral Daily  . pantoprazole  40 mg Oral Daily  . verapamil  240 mg Oral Daily     . sodium chloride 100 mL/hr at 11/16/17 0544    Assessment/Plan:   Chest Pain:  History of OM stent 02/2017 atypical r/o no acute ECG changes. Will observe with no cath for now avoid nitrates with HOCM  HOCM:  Classic murmur beta blocker started and verapamil increased will titrate coreg to 6.25 bid and ok to d/c home latter today SSCP may be from HOCM   HLD:  Continue statin target LDL Less than 70 with CAD   Will arrange outpatient f/u  with Dr Rosaura Carpenter [facility] 11/16/2017, 9:04 AM

## 2017-11-16 NOTE — Discharge Summary (Signed)
Physician Discharge Summary  Dana [name] CLE:[id number] DOB: 12/19/1940 DOA: 11/15/2017  PCP: [name], MD  Admit date: 11/15/2017 Discharge date: 11/16/2017  Admitted From: Home Disposition:  Home   Recommendations for Outpatient Follow-up:  1. Follow up with PCP in 1-2 weeks 2. Please obtain BMP/CBC in one week     Discharge Condition: Stable CODE STATUS:FULL Diet recommendation: Heart Healthy  Brief/Interim Summary: 77 year old female with a history of coronary artery disease status post DES to OM1 March 03, 2017 presenting with 2 to 3-day history of intermittent chest discomfort. The patient describes it as a chest tightness with some associated dizziness. She states that she has been having chest tightness associated with walking back and forth from the bathroom, but also having some chest discomfort at rest. She denies any nausea, vomiting, shortness of breath, syncope. The patient states that she has been taking nitroglycerin with relief of her chest discomfort. She denies any fevers, chills, headache, neck pain, nausea, vomiting, diarrhea, abdominal pain, dysuria. The patient was recently admitted from 09/16/2017 through 09/18/2017 for atypical chest pain which was thought to be due to GI etiology. The patient states that she took a dose of furosemide 40 mg on 11/14/2017.  Cardiology was consulted to assist with management.  Cardiology offered the possibility of heart catheterization to the patient and family.  The patient opted for more conservative approach.  The patient's blood pressure medications were adjusted.  She remained asymptomatic throughout the hospitalization.  Cardiology arrange for outpatient follow-up.     Discharge Diagnoses:  Chest pain -Patient has typical and atypical symptoms by clinical history -Cardiology consult appreciated-->coreg started and verapamil increased -cycle troponins--neg x 4 -family prefers medical therapy if  possible -Personally reviewed EKG--sinus rhythm, LVH changes, IVCD -Personally reviewed chest x-ray--no pulmonary edema or consolidation -09/18/2017 echo EF 75-80%, grade 2 DD, dynamic outflow tract obstruction  Coronary artery disease -DES placed to the OM1 03/13/2017;PTCA angioplasty lateral first marginal -Continue aspirin and Plavix -metoprolol stopped, coreg started  Hypertrophic obstructive cardiomyopathy -per cardiology -may be contributing to chest pain -suspect lasix/NTG may have caused some volume depletion-->soft BP  Essential hypertension -Holding metoprolol tartrate and verapamil secondary to soft blood pressure initially  Hyperlipidemia -Continue statin and Zetia  Rheumatoid arthritis -Patient takes methotrexate 15 mg every Tuesday  Gouty arthritis -Continue allopurinol      Discharge Instructions   Allergies as of 11/16/2017   No Known Allergies     Medication List    STOP taking these medications   metoprolol tartrate 100 MG tablet Commonly known as:  LOPRESSOR     TAKE these medications   allopurinol 300 MG tablet Commonly known as:  ZYLOPRIM Take 300 mg by mouth daily as needed (for gout).   aspirin EC 81 MG tablet Take 81 mg by mouth daily.   atorvastatin 10 MG tablet Commonly known as:  LIPITOR Take 10 mg by mouth daily.   carvedilol 6.25 MG tablet Commonly known as:  COREG Take 1 tablet (6.25 mg total) by mouth 2 (two) times daily with a meal.   clopidogrel 75 MG tablet Commonly known as:  PLAVIX TAKE 1 TABLET BY MOUTH ONCE DAILY.   ezetimibe 10 MG tablet Commonly known as:  ZETIA Take 10 mg by mouth daily.   fluticasone 50 MCG/ACT nasal spray Commonly known as:  FLONASE Place 2 sprays into both nostrils daily.   folic acid 1 MG tablet Commonly known as:  FOLVITE Take 1 mg by mouth daily.   furosemide 40  MG tablet Commonly known as:  LASIX Take 40 mg by mouth daily as needed for fluid.   methotrexate 2.5 MG  tablet Take 15 mg by mouth once a week. On Tuesday   nitroGLYCERIN 0.4 MG SL tablet Commonly known as:  NITROSTAT Place 1 tablet (0.4 mg total) under the tongue every 5 (five) minutes as needed for chest pain.   pantoprazole 40 MG tablet Commonly known as:  PROTONIX Take 40 mg by mouth daily.   RESTASIS MULTIDOSE 0.05 % ophthalmic emulsion Generic drug:  cycloSPORINE PLACE ONE DROP IN BOTH EYES TWICE DAILY   verapamil 240 MG CR tablet Commonly known as:  CALAN-SR Take 1 tablet (240 mg total) by mouth daily. Start taking on:  11/17/2017 What changed:    medication strength  how much to take       No Known Allergies  Consultations:  cardiology   Procedures/Studies: Dg Chest 2 View  Result Date: 11/15/2017 CLINICAL DATA:  Mid chest pain. EXAM: CHEST - 2 VIEW COMPARISON:  Radiograph 09/17/2027.  CT 01/29/2017 FINDINGS: The cardiomediastinal contours are normal. Aortic atherosclerosis and tortuosity. Pulmonary vasculature is normal. No consolidation, pleural effusion, or pneumothorax. No acute osseous abnormalities are seen. IMPRESSION: 1. No acute findings. 2.  Aortic Atherosclerosis (ICD10-I70.0). Electronically Signed   By: Rubye Oaks M.D.   On: 11/15/2017 01:47        Discharge Exam: Vitals:   11/16/17 0834 11/16/17 1120  BP: 107/72   Pulse: 84 78  Resp: (!) 24 15  Temp: 98.1 F (36.7 C) 98.5 F (36.9 C)  SpO2: 95% 94%   Vitals:   11/16/17 0600 11/16/17 0700 11/16/17 0834 11/16/17 1120  BP: (!) 124/59 117/68 107/72   Pulse: 81 84 84 78  Resp: (!) 24 20 (!) 24 15  Temp:   98.1 F (36.7 C) 98.5 F (36.9 C)  TempSrc:   Oral Oral  SpO2: 92% 97% 95% 94%  Weight:      Height:        General: Pt is alert, awake, not in acute distress Cardiovascular: RRR, S1/S2 +, no rubs, no gallops Respiratory: CTA bilaterally, no wheezing, no rhonchi Abdominal: Soft, NT, ND, bowel sounds + Extremities: no edema, no cyanosis   The results of significant  diagnostics from this hospitalization (including imaging, microbiology, ancillary and laboratory) are listed below for reference.    Significant Diagnostic Studies: Dg Chest 2 View  Result Date: 11/15/2017 CLINICAL DATA:  Mid chest pain. EXAM: CHEST - 2 VIEW COMPARISON:  Radiograph 09/17/2027.  CT 01/29/2017 FINDINGS: The cardiomediastinal contours are normal. Aortic atherosclerosis and tortuosity. Pulmonary vasculature is normal. No consolidation, pleural effusion, or pneumothorax. No acute osseous abnormalities are seen. IMPRESSION: 1. No acute findings. 2.  Aortic Atherosclerosis (ICD10-I70.0). Electronically Signed   By: Rubye Oaks M.D.   On: 11/15/2017 01:47     Microbiology: Recent Results (from the past 240 hour(s))  MRSA PCR Screening     Status: None   Collection Time: 11/15/17  4:15 AM  Result Value Ref Range Status   MRSA by PCR NEGATIVE NEGATIVE Final    Comment:        The GeneXpert MRSA Assay (FDA approved for NASAL specimens only), is one component of a comprehensive MRSA colonization surveillance program. It is not intended to diagnose MRSA infection nor to guide or monitor treatment for MRSA infections. Performed at [facility], [address] [place], [place] [address]      Labs: Basic Metabolic Panel: Recent  Labs  Lab 11/15/17 0120  NA 139  K 3.7  CL 102  CO2 28  GLUCOSE 108*  BUN 20  CREATININE 0.88  CALCIUM 9.1  MG 2.0  PHOS 5.5*   Liver Function Tests: No results for input(s): AST, ALT, ALKPHOS, BILITOT, PROT, ALBUMIN in the last 168 hours. No results for input(s): LIPASE, AMYLASE in the last 168 hours. No results for input(s): AMMONIA in the last 168 hours. CBC: Recent Labs  Lab 11/15/17 0120  WBC 5.5  NEUTROABS 3.4  HGB 10.4*  HCT 32.9*  MCV 84.8  PLT 272   Cardiac Enzymes: Recent Labs  Lab 11/15/17 0120 11/15/17 0757 11/15/17 1317  TROPONINI <0.03 <0.03 <0.03   BNP: Invalid input(s): POCBNP CBG: No results for  input(s): GLUCAP in the last 168 hours.  Time coordinating discharge:  36 minutes  Signed:  Catarina Hartshorn, DO [facility] Pager: [phone] 11/16/2017, 1:00 PM

## 2017-11-16 NOTE — Progress Notes (Signed)
Pt discharged home. Instructions including medications and follow up appointments given to son-in-law. All questions answered. Understanding verbalized. PIV removed; no complications. Pt taken out via WC by NT.

## 2017-11-16 NOTE — Progress Notes (Signed)
PROGRESS NOTE  Dana [name] GXQ:[id number] DOB: 1941/03/27 DOA: 11/15/2017 PCP: [name], MD  Brief History:  77 year old female with a history of coronary artery disease status post DES to OM1 March 03, 2017 presenting with 2 to 3-day history of intermittent chest discomfort.  The patient describes it as a chest tightness with some associated dizziness.  She states that she has been having chest tightness associated with walking back and forth from the bathroom, but also having some chest discomfort at rest.  She denies any nausea, vomiting, shortness of breath, syncope.  The patient states that she has been taking nitroglycerin with relief of her chest discomfort.  She denies any fevers, chills, headache, neck pain, nausea, vomiting, diarrhea, abdominal pain, dysuria.  The patient was recently admitted from 09/16/2017 through 09/18/2017 for atypical chest pain which was thought to be due to GI etiology.  The patient states that she took a dose of furosemide 40 mg on 11/14/2017.  Assessment/Plan: Chest pain -Patient has typical and atypical symptoms by clinical history -Cardiology consult appreciated-->coreg started and verapamil increased -cycle troponins--neg x 4 -family prefers medical therapy if possible -Personally reviewed EKG--sinus rhythm, LVH changes, IVCD -Personally reviewed chest x-ray--no pulmonary edema or consolidation -09/18/2017 echo EF 75-80%, grade 2 DD, dynamic outflow tract obstruction  Coronary artery disease -DES placed to the OM1 03/13/2017; PTCA angioplasty lateral first marginal -Continue aspirin and Plavix  Hypertrophic obstructive cardiomyopathy -per cardiology -may be contributing to chest pain -suspect lasix/NTG may have caused some volume depletion-->soft BP  Essential hypertension -Holding metoprolol tartrate and verapamil secondary to soft blood pressure initially  Hyperlipidemia -Continue statin and Zetia  Rheumatoid  arthritis -Patient takes methotrexate 15 mg every Tuesday  Gouty arthritis -Continue allopurinol    Disposition Plan:   Home when cleared by cardiology Family Communication:   Son-in-law updated at bedside 5/23  Consultants:  cardiology  Code Status:  FULL  DVT Prophylaxis:   [place] Lovenox   Procedures: As Listed in Progress Note Above  Antibiotics: None     Subjective: Patient denies fevers, chills, headache, chest pain, dyspnea, nausea, vomiting, diarrhea, abdominal pain, dysuria, hematuria, hematochezia, and melena.   Objective: Vitals:   11/16/17 0400 11/16/17 0500 11/16/17 0600 11/16/17 0700  BP:   (!) 124/59 117/68  Pulse:   81 84  Resp:   (!) 24 20  Temp: 98.6 F (37 C)     TempSrc: Oral     SpO2:   92% 97%  Weight:  78.6 kg (173 lb 4.5 oz)    Height:        Intake/Output Summary (Last 24 hours) at 11/16/2017 1448 Last data filed at 11/16/2017 0700 Gross per 24 hour  Intake 2140 ml  Output -  Net 2140 ml   Weight change: -3.955 kg (-8 lb 11.5 oz) Exam:   General:  Pt is alert, follows commands appropriately, not in acute distress  HEENT: No icterus, No thrush, No neck mass, [place]/AT  Cardiovascular: RRR, S1/S2, no rubs, no gallops  Respiratory: CTA bilaterally, no wheezing, no crackles, no rhonchi  Abdomen: Soft/+BS, non tender, non distended, no guarding  Extremities: No edema, No lymphangitis, No petechiae, No rashes, no synovitis   Data Reviewed: I have personally reviewed following labs and imaging studies Basic Metabolic Panel: Recent Labs  Lab 11/15/17 0120  NA 139  K 3.7  CL 102  CO2 28  GLUCOSE 108*  BUN 20  CREATININE 0.88  CALCIUM  9.1  MG 2.0  PHOS 5.5*   Liver Function Tests: No results for input(s): AST, ALT, ALKPHOS, BILITOT, PROT, ALBUMIN in the last 168 hours. No results for input(s): LIPASE, AMYLASE in the last 168 hours. No results for input(s): AMMONIA in the last 168 hours. Coagulation Profile: No  results for input(s): INR, PROTIME in the last 168 hours. CBC: Recent Labs  Lab 11/15/17 0120  WBC 5.5  NEUTROABS 3.4  HGB 10.4*  HCT 32.9*  MCV 84.8  PLT 272   Cardiac Enzymes: Recent Labs  Lab 11/15/17 0120 11/15/17 0757 11/15/17 1317  TROPONINI <0.03 <0.03 <0.03   BNP: Invalid input(s): POCBNP CBG: No results for input(s): GLUCAP in the last 168 hours. HbA1C: No results for input(s): HGBA1C in the last 72 hours. Urine analysis: No results found for: COLORURINE, APPEARANCEUR, LABSPEC, PHURINE, GLUCOSEU, HGBUR, BILIRUBINUR, KETONESUR, PROTEINUR, UROBILINOGEN, NITRITE, LEUKOCYTESUR Sepsis Labs: @LABRCNTIP (procalcitonin:4,lacticidven:4) ) Recent Results (from the past 240 hour(s))  MRSA PCR Screening     Status: None   Collection Time: 11/15/17  4:15 AM  Result Value Ref Range Status   MRSA by PCR NEGATIVE NEGATIVE Final    Comment:        The GeneXpert MRSA Assay (FDA approved for NASAL specimens only), is one component of a comprehensive MRSA colonization surveillance program. It is not intended to diagnose MRSA infection nor to guide or monitor treatment for MRSA infections. Performed at [facility], [address], [place], [place]      Scheduled Meds: . aspirin EC  81 mg Oral Daily  . atorvastatin  10 mg Oral Daily  . carvedilol  3.125 mg Oral BID WC  . clopidogrel  75 mg Oral Daily  . enoxaparin (LOVENOX) injection  40 mg Subcutaneous Q24H  . ezetimibe  10 mg Oral Daily  . fluticasone  2 spray Each Nare Daily  . folic acid  1 mg Oral Daily  . pantoprazole  40 mg Oral Daily  . verapamil  240 mg Oral Daily   Continuous Infusions: . sodium chloride 100 mL/hr at 11/16/17 0544    Procedures/Studies: Dg Chest 2 View  Result Date: 11/15/2017 CLINICAL DATA:  Mid chest pain. EXAM: CHEST - 2 VIEW COMPARISON:  Radiograph 09/17/2027.  CT 01/29/2017 FINDINGS: The cardiomediastinal contours are normal. Aortic atherosclerosis and tortuosity.  Pulmonary vasculature is normal. No consolidation, pleural effusion, or pneumothorax. No acute osseous abnormalities are seen. IMPRESSION: 1. No acute findings. 2.  Aortic Atherosclerosis (ICD10-I70.0). Electronically Signed   By: 03/31/2017 M.D.   On: 11/15/2017 01:47    11/17/2017, DO  [facility] Pager [phone]  If 7PM-7AM, please contact night-coverage [email or web address] Password [facility] 11/16/2017, 7:43 AM   LOS: 0 days

## 2017-12-08 ENCOUNTER — Encounter (HOSPITAL_COMMUNITY): Payer: Self-pay

## 2017-12-08 ENCOUNTER — Ambulatory Visit (HOSPITAL_COMMUNITY)
Admission: RE | Admit: 2017-12-08 | Discharge: 2017-12-08 | Disposition: A | Discharge status: Home / Self Care | Payer: Medicare Other | Source: Ambulatory Visit | Attending: Family Medicine | Admitting: Family Medicine

## 2017-12-08 DIAGNOSIS — Z1231 Encounter for screening mammogram for malignant neoplasm of breast: Secondary | ICD-10-CM | POA: Diagnosis not present

## 2018-01-24 ENCOUNTER — Encounter (HOSPITAL_COMMUNITY): Payer: Self-pay

## 2018-01-24 ENCOUNTER — Other Ambulatory Visit: Payer: Self-pay

## 2018-01-24 ENCOUNTER — Emergency Department (HOSPITAL_COMMUNITY)
Admission: EM | Admit: 2018-01-24 | Discharge: 2018-01-24 | Disposition: A | Discharge status: Home / Self Care | Payer: Medicare Other | Source: Home / Self Care | Attending: Emergency Medicine | Admitting: Emergency Medicine

## 2018-01-24 DIAGNOSIS — I10 Essential (primary) hypertension: Secondary | ICD-10-CM

## 2018-01-24 DIAGNOSIS — R1013 Epigastric pain: Secondary | ICD-10-CM

## 2018-01-24 DIAGNOSIS — Z7982 Long term (current) use of aspirin: Secondary | ICD-10-CM | POA: Insufficient documentation

## 2018-01-24 DIAGNOSIS — I2510 Atherosclerotic heart disease of native coronary artery without angina pectoris: Secondary | ICD-10-CM | POA: Insufficient documentation

## 2018-01-24 DIAGNOSIS — Z79899 Other long term (current) drug therapy: Secondary | ICD-10-CM

## 2018-01-24 DIAGNOSIS — R0602 Shortness of breath: Secondary | ICD-10-CM | POA: Diagnosis not present

## 2018-01-24 DIAGNOSIS — Z7901 Long term (current) use of anticoagulants: Secondary | ICD-10-CM

## 2018-01-24 DIAGNOSIS — I110 Hypertensive heart disease with heart failure: Secondary | ICD-10-CM | POA: Diagnosis not present

## 2018-01-24 LAB — CBC WITH DIFFERENTIAL/PLATELET
Basophils Absolute: 0 10*3/uL (ref 0.0–0.1)
Basophils Relative: 0 %
EOS ABS: 0 10*3/uL (ref 0.0–0.7)
Eosinophils Relative: 0 %
HEMATOCRIT: 37.2 % (ref 36.0–46.0)
HEMOGLOBIN: 12.2 g/dL (ref 12.0–15.0)
Lymphocytes Relative: 11 %
Lymphs Abs: 0.9 10*3/uL (ref 0.7–4.0)
MCH: 28.9 pg (ref 26.0–34.0)
MCHC: 32.8 g/dL (ref 30.0–36.0)
MCV: 88.2 fL (ref 78.0–100.0)
Monocytes Absolute: 0.7 10*3/uL (ref 0.1–1.0)
Monocytes Relative: 8 %
NEUTROS ABS: 6.7 10*3/uL (ref 1.7–7.7)
NEUTROS PCT: 81 %
Platelets: 240 10*3/uL (ref 150–400)
RBC: 4.22 MIL/uL (ref 3.87–5.11)
RDW: 18.6 % — ABNORMAL HIGH (ref 11.5–15.5)
WBC: 8.3 10*3/uL (ref 4.0–10.5)

## 2018-01-24 LAB — URINALYSIS, ROUTINE W REFLEX MICROSCOPIC
BILIRUBIN URINE: NEGATIVE
Glucose, UA: NEGATIVE mg/dL
HGB URINE DIPSTICK: NEGATIVE
KETONES UR: NEGATIVE mg/dL
LEUKOCYTES UA: NEGATIVE
Nitrite: POSITIVE — AB
Protein, ur: NEGATIVE mg/dL
SPECIFIC GRAVITY, URINE: 1.014 (ref 1.005–1.030)
pH: 8 (ref 5.0–8.0)

## 2018-01-24 LAB — COMPREHENSIVE METABOLIC PANEL
ALBUMIN: 3.9 g/dL (ref 3.5–5.0)
ALK PHOS: 78 U/L (ref 38–126)
ALT: 9 U/L (ref 0–44)
ANION GAP: 7 (ref 5–15)
AST: 19 U/L (ref 15–41)
BILIRUBIN TOTAL: 0.7 mg/dL (ref 0.3–1.2)
BUN: 14 mg/dL (ref 8–23)
CALCIUM: 8.7 mg/dL — AB (ref 8.9–10.3)
CO2: 28 mmol/L (ref 22–32)
Chloride: 104 mmol/L (ref 98–111)
Creatinine, Ser: 0.69 mg/dL (ref 0.44–1.00)
GFR calc non Af Amer: >60 mL/min (ref >60)
Glucose, Bld: 124 mg/dL — ABNORMAL HIGH (ref 70–99)
Potassium: 3.5 mmol/L (ref 3.5–5.1)
SODIUM: 139 mmol/L (ref 135–145)
TOTAL PROTEIN: 7.4 g/dL (ref 6.5–8.1)

## 2018-01-24 LAB — LIPASE, BLOOD: Lipase: 36 U/L (ref 11–51)

## 2018-01-24 MED ORDER — GI COCKTAIL ~~LOC~~
30.0000 mL | Freq: Once | ORAL | Status: AC
  Administered 2018-01-24: 30 mL via ORAL
  Filled 2018-01-24: qty 30

## 2018-01-24 MED ORDER — FAMOTIDINE IN NACL 20-0.9 MG/50ML-% IV SOLN
20.0000 mg | Freq: Once | INTRAVENOUS | Status: AC
  Administered 2018-01-24: 20 mg via INTRAVENOUS
  Filled 2018-01-24: qty 50

## 2018-01-24 MED ORDER — FOSFOMYCIN TROMETHAMINE 3 G PO PACK
3.0000 g | PACK | Freq: Once | ORAL | Status: DC
Start: 2018-01-24 — End: 2018-01-24

## 2018-01-24 MED ORDER — ONDANSETRON HCL 4 MG/2ML IJ SOLN
4.0000 mg | Freq: Once | INTRAMUSCULAR | Status: AC
  Administered 2018-01-24: 4 mg via INTRAVENOUS
  Filled 2018-01-24: qty 2

## 2018-01-24 MED ORDER — SUCRALFATE 1 G PO TABS: 1.0000 g | ORAL_TABLET | Freq: Three times a day (TID) | ORAL | 0 refills | Status: DC

## 2018-01-24 MED ORDER — SODIUM CHLORIDE 0.9 % IV SOLN
INTRAVENOUS | Status: DC
  Administered 2018-01-24: 13:00:00 via INTRAVENOUS

## 2018-01-24 MED ORDER — FENTANYL CITRATE (PF) 100 MCG/2ML IJ SOLN
50.0000 ug | Freq: Once | INTRAMUSCULAR | Status: AC
  Administered 2018-01-24: 50 ug via INTRAVENOUS
  Filled 2018-01-24: qty 2

## 2018-01-24 MED ORDER — FAMOTIDINE 20 MG PO TABS: 20.0000 mg | ORAL_TABLET | Freq: Two times a day (BID) | ORAL | 0 refills | Status: DC

## 2018-01-24 NOTE — Discharge Instructions (Addendum)
As discussed, your evaluation today has been largely reassuring.  But, it is important that you monitor your condition carefully, and do not hesitate to return to the ED if you develop new, or concerning changes in your condition. ? ?Otherwise, please follow-up with your physician for appropriate ongoing care. ? ?

## 2018-01-24 NOTE — ED Triage Notes (Signed)
Pt is having epigastic abdominal pain that states it feels like it's "full". Took Phillips last night and was able to have 4 BM's. Woke up this morning ate breakfast and started having pain as well. Vomited 3 times today.

## 2018-01-24 NOTE — ED Notes (Signed)
ED Provider at bedside. 

## 2018-01-24 NOTE — ED Provider Notes (Addendum)
[facility] EMERGENCY DEPARTMENT Provider Note   CSN: [id number] Arrival date & time: 01/24/18  1127     History   Chief Complaint Chief Complaint  Patient presents with  . Abdominal Pain    HPI Dana [name] is a 77 y.o. female.  HPI Patient presents with her son who assists with the HPI. Patient speaks Somoan. Over the past 24 hours the patient has had epigastric pain, nausea, vomiting. No other abdominal pain, no fever, no diarrhea. No relief with OTC medication. Patient denies history of abdominal surgery, states that she was well prior to the onset of illness. Epigastric pain is sharp, severe, nonradiating. Past Medical History:  Diagnosis Date  . CAD (coronary artery disease)    a. s/p DES to OM1 in 02/2017  . Essential hypertension   . Gout   . HOCM (hypertrophic obstructive cardiomyopathy) (HCC)   . Hyperlipidemia   . Rheumatoid arthritis [facility])     Patient Active Problem List   Diagnosis Date Noted  . Precordial chest pain   . CAD (coronary artery disease) 09/17/2017  . HOCM (hypertrophic obstructive cardiomyopathy) (HCC)   . Unstable angina (HCC)   . Coronary artery disease involving native coronary artery of native heart with unstable angina pectoris (HCC)   . Chest pain 01/29/2017  . Hypertrophic cardiomyopathy (HCC) 01/29/2017  . MR (mitral regurgitation) 01/29/2017  . HTN (hypertension) 01/29/2017  . Hyperlipidemia 01/29/2017  . Gout 01/29/2017  . Arthritis 01/29/2017  . Anemia 01/29/2017    Past Surgical History:  Procedure Laterality Date  . CORONARY BALLOON ANGIOPLASTY N/A 03/03/2017   Procedure: CORONARY BALLOON ANGIOPLASTY;  Surgeon: Marykay Lex, MD;  Location: [facility] INVASIVE CV LAB;  Service: Cardiovascular;  Laterality: N/A;  . CORONARY STENT INTERVENTION N/A 03/03/2017   Procedure: CORONARY STENT INTERVENTION;  Surgeon: Marykay Lex, MD;  Location: [facility] INVASIVE CV LAB;  Service: Cardiovascular;  Laterality: N/A;  . LEFT HEART CATH AND  CORONARY ANGIOGRAPHY N/A 03/02/2017   Procedure: LEFT HEART CATH AND CORONARY ANGIOGRAPHY;  Surgeon: Tonny Bollman, MD;  Location: [facility] INVASIVE CV LAB;  Service: Cardiovascular;  Laterality: N/A;     OB History    Gravida  7   Para      Term      Preterm      AB      Living  7     SAB      TAB      Ectopic      Multiple      Live Births               Home Medications    Prior to Admission medications   Medication Sig Start Date End Date Taking? Authorizing Provider  ferrous sulfate (FEROSUL) 325 (65 FE) MG tablet Take 325 mg by mouth daily with breakfast.   Yes [provider]  allopurinol (ZYLOPRIM) 300 MG tablet Take 300 mg by mouth daily as needed (for gout).     [provider]  aspirin EC 81 MG tablet Take 81 mg by mouth daily.    [provider]  atorvastatin (LIPITOR) 10 MG tablet Take 10 mg by mouth daily.    [provider]  carvedilol (COREG) 6.25 MG tablet Take 1 tablet (6.25 mg total) by mouth 2 (two) times daily with a meal. 11/16/17   Tat, Onalee Hua, MD  clopidogrel (PLAVIX) 75 MG tablet TAKE 1 TABLET BY MOUTH ONCE DAILY. 11/08/17   Jonelle Sidle, MD  ezetimibe (  ZETIA) 10 MG tablet Take 10 mg by mouth daily.    [provider]  fluticasone (FLONASE) 50 MCG/ACT nasal spray Place 2 sprays into both nostrils daily.    [provider]  folic acid (FOLVITE) 1 MG tablet Take 1 mg by mouth daily.    [provider]  furosemide (LASIX) 40 MG tablet Take 40 mg by mouth daily as needed for fluid.     [provider]  methotrexate 2.5 MG tablet Take 15 mg by mouth once a week. On Tuesday    [provider]  nitroGLYCERIN (NITROSTAT) 0.4 MG SL tablet Place 1 tablet (0.4 mg total) under the tongue every 5 (five) minutes as needed for chest pain. 03/04/17   Robbie Lis M, PA-C  pantoprazole (PROTONIX) 40 MG tablet Take 40 mg by mouth daily.    [provider]  RESTASIS  MULTIDOSE 0.05 % ophthalmic emulsion PLACE ONE DROP IN BOTH EYES TWICE DAILY 12/26/16   [provider]  verapamil (CALAN-SR) 240 MG CR tablet Take 1 tablet (240 mg total) by mouth daily. 11/17/17   Catarina Hartshorn, MD    Family History Family History  Problem Relation Age of Onset  . Hypertension Mother   . Liver disease Mother   . Alcohol abuse Father     Social History Social History   Tobacco Use  . Smoking status: Never Smoker  . Smokeless tobacco: Never Used  Substance Use Topics  . Alcohol use: No  . Drug use: No     Allergies   Patient has no known allergies.   Review of Systems Review of Systems  Constitutional:       Per HPI, otherwise negative  HENT:       Per HPI, otherwise negative  Respiratory:       Per HPI, otherwise negative  Cardiovascular:       Per HPI, otherwise negative  Gastrointestinal: Positive for abdominal pain, nausea and vomiting.  Endocrine:       Negative aside from HPI  Genitourinary:       Neg aside from HPI   Musculoskeletal:       Per HPI, otherwise negative  Skin: Negative.   Neurological: Negative for syncope.     Physical Exam Updated Vital Signs BP (!) 173/77   Pulse 64   Temp 99.1 F (37.3 C) (Oral)   Resp 14   Wt 78.5 kg (173 lb)   SpO2 96%   BMI 31.64 kg/m   Physical Exam  Constitutional: She is oriented to person, place, and time. She appears well-developed and well-nourished. No distress.  HENT:  Head: Normocephalic and atraumatic.  Eyes: Conjunctivae and EOM are normal.  Cardiovascular: Normal rate and regular rhythm.  Pulmonary/Chest: Effort normal and breath sounds normal. No stridor. No respiratory distress.  Abdominal: She exhibits no distension. There is tenderness in the epigastric area.  Musculoskeletal: She exhibits no edema.  Neurological: She is alert and oriented to person, place, and time. No cranial nerve deficit.  Skin: Skin is warm and dry.  Psychiatric: She has a normal mood and  affect.  Nursing note and vitals reviewed.    ED Treatments / Results  Labs (all labs ordered are listed, but only abnormal results are displayed) Labs Reviewed  COMPREHENSIVE METABOLIC PANEL - Abnormal; Notable for the following components:      Result Value   Glucose, Bld 124 (*)    Calcium 8.7 (*)    All other components within normal  limits  CBC WITH DIFFERENTIAL/PLATELET - Abnormal; Notable for the following components:   RDW 18.6 (*)    All other components within normal limits  LIPASE, BLOOD  URINALYSIS, ROUTINE W REFLEX MICROSCOPIC   Procedures Procedures (including critical care time)  Medications Ordered in ED Medications  0.9 %  sodium chloride infusion ( Intravenous New Bag/Given 01/24/18 1232)  fentaNYL (SUBLIMAZE) injection 50 mcg (50 mcg Intravenous Given 01/24/18 1232)  ondansetron (ZOFRAN) injection 4 mg (4 mg Intravenous Given 01/24/18 1232)  gi cocktail (Maalox,Lidocaine,Donnatal) (30 mLs Oral Given 01/24/18 1253)  famotidine (PEPCID) IVPB 20 mg premix (0 mg Intravenous Stopped 01/24/18 1323)     Initial Impression / Assessment and Plan / ED Course  I have reviewed the triage vital signs and the nursing notes.  Pertinent labs & imaging results that were available during my care of the patient were reviewed by me and considered in my medical decision making (see chart for details).   Exam patient is sleeping, appears much more comfortable. She is receiving Pepcid IV, has received GI cocktail.   3:00 PM On repeat exam patient is sleeping again, appears substantially more comfortable, remains hemodynamically unremarkable. Discussed all findings with the patient's son, her. No substantial lab abnormalities, no evidence for pancreatitis, given her improvement following GI cocktail, Pepcid, or suspicion for gastroesophageal etiology.  Absent other complaints including pain, dyspnea, low suspicion for atypical ACS or pulmonary etiology.  No evidence for  bacteremia, sepsis.  And nitrite positive urine, but leukocyte negative, and no urinary complaints, no lower abdominal pain, and was afebrile. Urine culture sent.  Given her improvement, the patient was discharged with ongoing therapy, outpatient GI follow-up as needed.   Final Clinical Impressions(s) / ED Diagnoses  Abdominal pain Nausea and vomiting   Gerhard Munch, MD 01/24/18 1501    Gerhard Munch, MD 01/24/18 1530

## 2018-01-26 ENCOUNTER — Emergency Department (HOSPITAL_COMMUNITY): Payer: Medicare Other

## 2018-01-26 ENCOUNTER — Other Ambulatory Visit: Payer: Self-pay

## 2018-01-26 ENCOUNTER — Observation Stay (HOSPITAL_COMMUNITY): Payer: Medicare Other

## 2018-01-26 ENCOUNTER — Inpatient Hospital Stay (HOSPITAL_COMMUNITY)
Admission: EM | Admit: 2018-01-26 | Discharge: 2018-01-29 | DRG: 292 | Disposition: A | Discharge status: Home / Self Care | Payer: Medicare Other | Attending: Family Medicine | Admitting: Family Medicine

## 2018-01-26 ENCOUNTER — Encounter (HOSPITAL_COMMUNITY): Payer: Self-pay | Admitting: Emergency Medicine

## 2018-01-26 DIAGNOSIS — I421 Obstructive hypertrophic cardiomyopathy: Secondary | ICD-10-CM | POA: Diagnosis present

## 2018-01-26 DIAGNOSIS — R0602 Shortness of breath: Secondary | ICD-10-CM

## 2018-01-26 DIAGNOSIS — I110 Hypertensive heart disease with heart failure: Principal | ICD-10-CM | POA: Diagnosis present

## 2018-01-26 DIAGNOSIS — I5033 Acute on chronic diastolic (congestive) heart failure: Secondary | ICD-10-CM | POA: Diagnosis not present

## 2018-01-26 DIAGNOSIS — B9620 Unspecified Escherichia coli [E. coli] as the cause of diseases classified elsewhere: Secondary | ICD-10-CM | POA: Diagnosis present

## 2018-01-26 DIAGNOSIS — K219 Gastro-esophageal reflux disease without esophagitis: Secondary | ICD-10-CM | POA: Diagnosis not present

## 2018-01-26 DIAGNOSIS — Z7982 Long term (current) use of aspirin: Secondary | ICD-10-CM

## 2018-01-26 DIAGNOSIS — Z7902 Long term (current) use of antithrombotics/antiplatelets: Secondary | ICD-10-CM

## 2018-01-26 DIAGNOSIS — R7989 Other specified abnormal findings of blood chemistry: Secondary | ICD-10-CM | POA: Diagnosis present

## 2018-01-26 DIAGNOSIS — M069 Rheumatoid arthritis, unspecified: Secondary | ICD-10-CM | POA: Diagnosis present

## 2018-01-26 DIAGNOSIS — N390 Urinary tract infection, site not specified: Secondary | ICD-10-CM | POA: Diagnosis not present

## 2018-01-26 DIAGNOSIS — Z79899 Other long term (current) drug therapy: Secondary | ICD-10-CM

## 2018-01-26 DIAGNOSIS — I509 Heart failure, unspecified: Secondary | ICD-10-CM

## 2018-01-26 DIAGNOSIS — I2510 Atherosclerotic heart disease of native coronary artery without angina pectoris: Secondary | ICD-10-CM | POA: Diagnosis present

## 2018-01-26 DIAGNOSIS — R748 Abnormal levels of other serum enzymes: Secondary | ICD-10-CM | POA: Diagnosis not present

## 2018-01-26 DIAGNOSIS — I422 Other hypertrophic cardiomyopathy: Secondary | ICD-10-CM

## 2018-01-26 DIAGNOSIS — R1011 Right upper quadrant pain: Secondary | ICD-10-CM

## 2018-01-26 DIAGNOSIS — M109 Gout, unspecified: Secondary | ICD-10-CM | POA: Diagnosis present

## 2018-01-26 DIAGNOSIS — R109 Unspecified abdominal pain: Secondary | ICD-10-CM | POA: Diagnosis present

## 2018-01-26 DIAGNOSIS — E785 Hyperlipidemia, unspecified: Secondary | ICD-10-CM | POA: Diagnosis present

## 2018-01-26 DIAGNOSIS — Z955 Presence of coronary angioplasty implant and graft: Secondary | ICD-10-CM

## 2018-01-26 DIAGNOSIS — I10 Essential (primary) hypertension: Secondary | ICD-10-CM | POA: Diagnosis present

## 2018-01-26 DIAGNOSIS — R778 Other specified abnormalities of plasma proteins: Secondary | ICD-10-CM | POA: Diagnosis present

## 2018-01-26 DIAGNOSIS — E876 Hypokalemia: Secondary | ICD-10-CM | POA: Diagnosis present

## 2018-01-26 LAB — TROPONIN I
TROPONIN I: 0.31 ng/mL — AB (ref <0.03)
Troponin I: 0.46 ng/mL (ref <0.03)

## 2018-01-26 LAB — COMPREHENSIVE METABOLIC PANEL
ALT: 10 U/L (ref 0–44)
AST: 17 U/L (ref 15–41)
Albumin: 3.2 g/dL — ABNORMAL LOW (ref 3.5–5.0)
Alkaline Phosphatase: 58 U/L (ref 38–126)
Anion gap: 9 (ref 5–15)
BUN: 18 mg/dL (ref 8–23)
CHLORIDE: 100 mmol/L (ref 98–111)
CO2: 25 mmol/L (ref 22–32)
Calcium: 8.2 mg/dL — ABNORMAL LOW (ref 8.9–10.3)
Creatinine, Ser: 1.13 mg/dL — ABNORMAL HIGH (ref 0.44–1.00)
GFR calc Af Amer: 53 mL/min — ABNORMAL LOW (ref >60)
GFR calc non Af Amer: 46 mL/min — ABNORMAL LOW (ref >60)
Glucose, Bld: 116 mg/dL — ABNORMAL HIGH (ref 70–99)
Potassium: 2.8 mmol/L — ABNORMAL LOW (ref 3.5–5.1)
Sodium: 134 mmol/L — ABNORMAL LOW (ref 135–145)
Total Bilirubin: 1.6 mg/dL — ABNORMAL HIGH (ref 0.3–1.2)
Total Protein: 6.5 g/dL (ref 6.5–8.1)

## 2018-01-26 LAB — CBC
HCT: 35.8 % — ABNORMAL LOW (ref 36.0–46.0)
Hemoglobin: 11.9 g/dL — ABNORMAL LOW (ref 12.0–15.0)
MCH: 29 pg (ref 26.0–34.0)
MCHC: 33.2 g/dL (ref 30.0–36.0)
MCV: 87.1 fL (ref 78.0–100.0)
PLATELETS: 197 10*3/uL (ref 150–400)
RBC: 4.11 MIL/uL (ref 3.87–5.11)
RDW: 18.5 % — AB (ref 11.5–15.5)
WBC: 11.8 10*3/uL — ABNORMAL HIGH (ref 4.0–10.5)

## 2018-01-26 LAB — MAGNESIUM: MAGNESIUM: 1.9 mg/dL (ref 1.7–2.4)

## 2018-01-26 LAB — BRAIN NATRIURETIC PEPTIDE: B NATRIURETIC PEPTIDE 5: 1106 pg/mL — AB (ref 0.0–100.0)

## 2018-01-26 LAB — LACTIC ACID, PLASMA: LACTIC ACID, VENOUS: 0.9 mmol/L (ref 0.5–1.9)

## 2018-01-26 MED ORDER — ATORVASTATIN CALCIUM 10 MG PO TABS
10.0000 mg | ORAL_TABLET | Freq: Every day | ORAL | Status: DC
  Administered 2018-01-26 – 2018-01-29 (×4): 10 mg via ORAL
  Filled 2018-01-26 (×4): qty 1

## 2018-01-26 MED ORDER — ONDANSETRON HCL 4 MG PO TABS: 4.0000 mg | ORAL_TABLET | Freq: Four times a day (QID) | ORAL | Status: DC | PRN

## 2018-01-26 MED ORDER — FAMOTIDINE 20 MG PO TABS
20.0000 mg | ORAL_TABLET | Freq: Every day | ORAL | Status: DC
  Administered 2018-01-26 – 2018-01-28 (×3): 20 mg via ORAL
  Filled 2018-01-26 (×3): qty 1

## 2018-01-26 MED ORDER — ASPIRIN EC 81 MG PO TBEC
81.0000 mg | DELAYED_RELEASE_TABLET | Freq: Every day | ORAL | Status: DC
  Administered 2018-01-26 – 2018-01-29 (×4): 81 mg via ORAL
  Filled 2018-01-26 (×4): qty 1

## 2018-01-26 MED ORDER — FAMOTIDINE 20 MG PO TABS: 20.0000 mg | ORAL_TABLET | Freq: Two times a day (BID) | ORAL | Status: DC

## 2018-01-26 MED ORDER — SODIUM CHLORIDE 0.9 % IV SOLN
1.0000 g | INTRAVENOUS | Status: DC
  Administered 2018-01-27 – 2018-01-29 (×3): 1 g via INTRAVENOUS
  Filled 2018-01-26 (×2): qty 1
  Filled 2018-01-26: qty 10
  Filled 2018-01-26: qty 1

## 2018-01-26 MED ORDER — ENOXAPARIN SODIUM 40 MG/0.4ML ~~LOC~~ SOLN
40.0000 mg | SUBCUTANEOUS | Status: DC
  Administered 2018-01-26 – 2018-01-28 (×3): 40 mg via SUBCUTANEOUS
  Filled 2018-01-26 (×4): qty 0.4

## 2018-01-26 MED ORDER — FLUTICASONE PROPIONATE 50 MCG/ACT NA SUSP
2.0000 | Freq: Every day | NASAL | Status: DC
  Administered 2018-01-27 – 2018-01-29 (×3): 2 via NASAL
  Filled 2018-01-26 (×2): qty 16

## 2018-01-26 MED ORDER — FOLIC ACID 1 MG PO TABS
1.0000 mg | ORAL_TABLET | Freq: Every day | ORAL | Status: DC
  Administered 2018-01-26 – 2018-01-29 (×4): 1 mg via ORAL
  Filled 2018-01-26 (×4): qty 1

## 2018-01-26 MED ORDER — METHOTREXATE 2.5 MG PO TABS
15.0000 mg | ORAL_TABLET | ORAL | Status: DC
  Administered 2018-01-26: 15 mg via ORAL
  Filled 2018-01-26: qty 6

## 2018-01-26 MED ORDER — SODIUM CHLORIDE 0.9 % IV SOLN
1.0000 g | Freq: Once | INTRAVENOUS | Status: AC
  Administered 2018-01-26: 1 g via INTRAVENOUS
  Filled 2018-01-26: qty 10

## 2018-01-26 MED ORDER — ONDANSETRON HCL 4 MG/2ML IJ SOLN: 4.0000 mg | Freq: Four times a day (QID) | INTRAMUSCULAR | Status: DC | PRN

## 2018-01-26 MED ORDER — CLOPIDOGREL BISULFATE 75 MG PO TABS
75.0000 mg | ORAL_TABLET | Freq: Every day | ORAL | Status: DC
  Administered 2018-01-26 – 2018-01-29 (×4): 75 mg via ORAL
  Filled 2018-01-26 (×4): qty 1

## 2018-01-26 MED ORDER — CYCLOSPORINE 0.05 % OP EMUL
1.0000 [drp] | Freq: Two times a day (BID) | OPHTHALMIC | Status: DC
  Administered 2018-01-26 – 2018-01-29 (×6): 1 [drp] via OPHTHALMIC
  Filled 2018-01-26 (×7): qty 1

## 2018-01-26 MED ORDER — FUROSEMIDE 10 MG/ML IJ SOLN
40.0000 mg | Freq: Once | INTRAMUSCULAR | Status: AC
  Administered 2018-01-26: 40 mg via INTRAVENOUS
  Filled 2018-01-26: qty 4

## 2018-01-26 MED ORDER — NITROGLYCERIN 0.4 MG SL SUBL: 0.4000 mg | SUBLINGUAL_TABLET | SUBLINGUAL | Status: DC | PRN

## 2018-01-26 MED ORDER — PANTOPRAZOLE SODIUM 40 MG PO TBEC
40.0000 mg | DELAYED_RELEASE_TABLET | Freq: Every day | ORAL | Status: DC
  Administered 2018-01-26 – 2018-01-29 (×4): 40 mg via ORAL
  Filled 2018-01-26 (×4): qty 1

## 2018-01-26 MED ORDER — ACETAMINOPHEN 325 MG PO TABS: 650.0000 mg | ORAL_TABLET | Freq: Four times a day (QID) | ORAL | Status: DC | PRN

## 2018-01-26 MED ORDER — FUROSEMIDE 40 MG PO TABS
40.0000 mg | ORAL_TABLET | Freq: Every day | ORAL | Status: DC
  Administered 2018-01-27 – 2018-01-29 (×3): 40 mg via ORAL
  Filled 2018-01-26 (×4): qty 1

## 2018-01-26 MED ORDER — POTASSIUM CHLORIDE CRYS ER 20 MEQ PO TBCR
40.0000 meq | EXTENDED_RELEASE_TABLET | ORAL | Status: AC
  Administered 2018-01-26 – 2018-01-27 (×3): 40 meq via ORAL
  Filled 2018-01-26 (×3): qty 2

## 2018-01-26 MED ORDER — ACETAMINOPHEN 650 MG RE SUPP: 650.0000 mg | Freq: Four times a day (QID) | RECTAL | Status: DC | PRN

## 2018-01-26 MED ORDER — SUCRALFATE 1 G PO TABS
1.0000 g | ORAL_TABLET | Freq: Three times a day (TID) | ORAL | Status: DC
  Administered 2018-01-26 – 2018-01-29 (×12): 1 g via ORAL
  Filled 2018-01-26 (×12): qty 1

## 2018-01-26 MED ORDER — POTASSIUM CHLORIDE 10 MEQ/100ML IV SOLN
10.0000 meq | INTRAVENOUS | Status: AC
  Administered 2018-01-26 (×3): 10 meq via INTRAVENOUS
  Filled 2018-01-26 (×3): qty 100

## 2018-01-26 MED ORDER — EZETIMIBE 10 MG PO TABS
10.0000 mg | ORAL_TABLET | Freq: Every day | ORAL | Status: DC
  Administered 2018-01-26 – 2018-01-29 (×4): 10 mg via ORAL
  Filled 2018-01-26 (×4): qty 1

## 2018-01-26 NOTE — ED Triage Notes (Signed)
Son in [place], bring pt in states she is have difficulty breathing, worse when laying down. Pt is on fluid pills, but feels like her breathing is getting worse.

## 2018-01-26 NOTE — ED Notes (Signed)
Date and time results received: 01/26/18 1257 (use smartphrase ".now" to insert current time)  Test: trop Critical Value: 0.31  Name of Provider Notified: bero  Orders Received? Or Actions Taken?: none

## 2018-01-26 NOTE — ED Notes (Signed)
Pt c/o continued ruq abd pain and increased sob today.

## 2018-01-26 NOTE — Progress Notes (Signed)
Patient admitted to floor from Ed. Patient alert and oriented. No family members in room at time of admission. Pt is Samoan and speaks very little [place]. Telephonic Interpreter services utilized for entire admission process and medication administration.  Samoan Translator Ethel # [phone] has been utilized. All questions were answered by patient via interpreter. No further questions at this time. Patient demonstrated understanding of admission questions and medications via interpreter Ethel # [phone].

## 2018-01-26 NOTE — ED Notes (Signed)
Pt denies dizziness c/o feeling sleepy. Manual BP 88/54. EDP notified.

## 2018-01-26 NOTE — Progress Notes (Signed)
Patient's troponin was 0.31 and is now 0.46. Paged doctor to make aware. Will continue to monitor.

## 2018-01-26 NOTE — H&P (Signed)
History and Physical    Dana [name] UJW:[id number] DOB: 1941-06-08 DOA: 01/26/2018  PCP: [name], MD  Patient coming from: home  I have personally briefly reviewed patient's old medical records in [facility]  Chief Complaint: shortness of breath  HPI: Dana [name] is a 77 y.o. female with medical history significant of HOCM, CAD, Rheumatoid arthritis, HLD, HTN, who presents to the ED with shortness of breath. History is somewhat limited since patient does not speak english and history is obtained through her son who is translating. Patient has been having epigastric pain that has progressed to RUQ pain for the last 3-4 days. She was seen in the ED 2 days ago, where work up was unrevealing and she was discharge home. Son reports that for the last few days, she has been more short of breath, which is worse at night when she is sleeping and when she is talking on the phone. She denies any chest pain. She has not had any nausea or vomiting. She has had frequent stools, but this is after she took a laxative. Her abdominal pain is not worsened after eating or drinking. She has not had a fever, cough. She has not noted any worsening swelling.  ED Course: blood pressures are noted to be soft in the 80s-90s range. Lactate was noted to be normal. She does have a bump in her creatinine when compared to 2 days ago and is currently at 1.1. Bilirubin has also bumped up at 1.6, with normal transaminases. BNP elevated at 1,106 and troponin 0.31. EKG without acute changes. Urine culture drawn on last ED visit 2 days ago growing GNR. Chest xray shows vascular congestion without any pleural effusions  Review of Systems: As per HPI otherwise 10 point review of systems negative.    Past Medical History:  Diagnosis Date  . CAD (coronary artery disease)    a. s/p DES to OM1 in 02/2017  . Essential hypertension   . Gout   . HOCM (hypertrophic obstructive cardiomyopathy) (HCC)   . Hyperlipidemia   .  Rheumatoid arthritis [facility])     Past Surgical History:  Procedure Laterality Date  . CORONARY BALLOON ANGIOPLASTY N/A 03/03/2017   Procedure: CORONARY BALLOON ANGIOPLASTY;  Surgeon: Marykay Lex, MD;  Location: [facility] INVASIVE CV LAB;  Service: Cardiovascular;  Laterality: N/A;  . CORONARY STENT INTERVENTION N/A 03/03/2017   Procedure: CORONARY STENT INTERVENTION;  Surgeon: Marykay Lex, MD;  Location: [facility] INVASIVE CV LAB;  Service: Cardiovascular;  Laterality: N/A;  . LEFT HEART CATH AND CORONARY ANGIOGRAPHY N/A 03/02/2017   Procedure: LEFT HEART CATH AND CORONARY ANGIOGRAPHY;  Surgeon: Tonny Bollman, MD;  Location: [facility] INVASIVE CV LAB;  Service: Cardiovascular;  Laterality: N/A;     reports that she has never smoked. She has never used smokeless tobacco. She reports that she does not drink alcohol or use drugs.  No Known Allergies  Family History  Problem Relation Age of Onset  . Hypertension Mother   . Liver disease Mother   . Alcohol abuse Father      Prior to Admission medications   Medication Sig Start Date End Date Taking? Authorizing Provider  allopurinol (ZYLOPRIM) 300 MG tablet Take 300 mg by mouth daily as needed (for gout).    Yes [provider]  aspirin EC 81 MG tablet Take 81 mg by mouth daily.   Yes [provider]  atorvastatin (LIPITOR) 10 MG tablet Take 10 mg by mouth daily.   Yes [provider]  carvedilol (COREG) 6.25 MG tablet Take 1 tablet (6.25 mg total) by mouth 2 (two) times daily with a meal. 11/16/17  Yes Tat, David, MD  clopidogrel (PLAVIX) 75 MG tablet TAKE 1 TABLET BY MOUTH ONCE DAILY. 11/08/17  Yes Jonelle Sidle, MD  ezetimibe (ZETIA) 10 MG tablet Take 10 mg by mouth daily.   Yes [provider]  famotidine (PEPCID) 20 MG tablet Take 1 tablet (20 mg total) by mouth 2 (two) times daily. 01/24/18  Yes Gerhard Munch, MD  ferrous sulfate (FEROSUL) 325 (65 FE) MG tablet Take 325 mg by mouth daily with breakfast.   Yes  [provider]  fluticasone (FLONASE) 50 MCG/ACT nasal spray Place 2 sprays into both nostrils daily.   Yes [provider]  folic acid (FOLVITE) 1 MG tablet Take 1 mg by mouth daily.   Yes [provider]  furosemide (LASIX) 40 MG tablet Take 40 mg by mouth daily as needed for fluid.    Yes [provider]  nitroGLYCERIN (NITROSTAT) 0.4 MG SL tablet Place 1 tablet (0.4 mg total) under the tongue every 5 (five) minutes as needed for chest pain. 03/04/17  Yes Sharol Harness, Brittainy M, PA-C  pantoprazole (PROTONIX) 40 MG tablet Take 40 mg by mouth daily.   Yes [provider]  RESTASIS MULTIDOSE 0.05 % ophthalmic emulsion PLACE ONE DROP IN BOTH EYES TWICE DAILY 12/26/16  Yes [provider]  sucralfate (CARAFATE) 1 g tablet Take 1 tablet (1 g total) by mouth 4 (four) times daily -  with meals and at bedtime. 01/24/18  Yes Gerhard Munch, MD  verapamil (CALAN-SR) 240 MG CR tablet Take 1 tablet (240 mg total) by mouth daily. 11/17/17  Yes Tat, Onalee Hua, MD  methotrexate 2.5 MG tablet Take 15 mg by mouth once a week. On Thursday    [provider]    Physical Exam: Vitals:   01/26/18 1300 01/26/18 1330 01/26/18 1500 01/26/18 1503  BP: (!) 95/51 (!) 100/55 (!) 98/57   Pulse: 70 65 66 68  Resp: (!) 31 (!) 26 (!) 33 (!) 23  Temp:      TempSrc:      SpO2: 91% 93% 94% 95%  Weight:      Height:        Constitutional: NAD, calm, comfortable Vitals:   01/26/18 1300 01/26/18 1330 01/26/18 1500 01/26/18 1503  BP: (!) 95/51 (!) 100/55 (!) 98/57   Pulse: 70 65 66 68  Resp: (!) 31 (!) 26 (!) 33 (!) 23  Temp:      TempSrc:      SpO2: 91% 93% 94% 95%  Weight:      Height:       Eyes: PERRL, lids and conjunctivae normal ENMT: Mucous membranes are moist. Posterior pharynx clear of any exudate or lesions.Normal dentition.  Neck: normal, supple, no masses, no thyromegaly Respiratory: mild crackles at bases. Normal respiratory effort. No accessory  muscle use.  Cardiovascular: Regular rate and rhythm, no murmurs / rubs / gallops. No extremity edema. 2+ pedal pulses. No carotid bruits.  Abdomen: no tenderness, no masses palpated. No hepatosplenomegaly. Bowel sounds positive.  Musculoskeletal: no clubbing / cyanosis. No joint deformity upper and lower extremities. Good ROM, no contractures. Normal muscle tone.  Skin: no rashes, lesions, ulcers. No induration Neurologic: CN 2-12 grossly intact. Sensation intact, DTR normal. Strength 5/5 in all 4.  Psychiatric: could not adequately assess due to language barrier   Labs on Admission: I have  personally reviewed following labs and imaging studies  CBC: Recent Labs  Lab 01/24/18 1201 01/26/18 1209  WBC 8.3 11.8*  NEUTROABS 6.7  --   HGB 12.2 11.9*  HCT 37.2 35.8*  MCV 88.2 87.1  PLT 240 197   Basic Metabolic Panel: Recent Labs  Lab 01/24/18 1201 01/26/18 1209  NA 139 134*  K 3.5 2.8*  CL 104 100  CO2 28 25  GLUCOSE 124* 116*  BUN 14 18  CREATININE 0.69 1.13*  CALCIUM 8.7* 8.2*   GFR: Estimated Creatinine Clearance: 43.9 mL/min (A) (by C-G formula based on SCr of 1.13 mg/dL (H)). Liver Function Tests: Recent Labs  Lab 01/24/18 1201 01/26/18 1209  AST 19 17  ALT 9 10  ALKPHOS 78 58  BILITOT 0.7 1.6*  PROT 7.4 6.5  ALBUMIN 3.9 3.2*   Recent Labs  Lab 01/24/18 1201  LIPASE 36   No results for input(s): AMMONIA in the last 168 hours. Coagulation Profile: No results for input(s): INR, PROTIME in the last 168 hours. Cardiac Enzymes: Recent Labs  Lab 01/26/18 1209  TROPONINI 0.31*   BNP (last 3 results) No results for input(s): PROBNP in the last 8760 hours. HbA1C: No results for input(s): HGBA1C in the last 72 hours. CBG: No results for input(s): GLUCAP in the last 168 hours. Lipid Profile: No results for input(s): CHOL, HDL, LDLCALC, TRIG, CHOLHDL, LDLDIRECT in the last 72 hours. Thyroid Function Tests: No results for input(s): TSH, T4TOTAL, FREET4,  T3FREE, THYROIDAB in the last 72 hours. Anemia Panel: No results for input(s): VITAMINB12, FOLATE, FERRITIN, TIBC, IRON, RETICCTPCT in the last 72 hours. Urine analysis:    Component Value Date/Time   COLORURINE YELLOW 01/24/2018 1201   APPEARANCEUR CLOUDY (A) 01/24/2018 1201   LABSPEC 1.014 01/24/2018 1201   PHURINE 8.0 01/24/2018 1201   GLUCOSEU NEGATIVE 01/24/2018 1201   HGBUR NEGATIVE 01/24/2018 1201   BILIRUBINUR NEGATIVE 01/24/2018 1201   KETONESUR NEGATIVE 01/24/2018 1201   PROTEINUR NEGATIVE 01/24/2018 1201   NITRITE POSITIVE (A) 01/24/2018 1201   LEUKOCYTESUR NEGATIVE 01/24/2018 1201    Radiological Exams on Admission: Dg Chest Port 1 View  Result Date: 01/26/2018 CLINICAL DATA:  Shortness of breath, particularly when lying down. EXAM: PORTABLE CHEST 1 VIEW COMPARISON:  PA and lateral chest 11/15/2017. FINDINGS: Heart size is upper normal and there is pulmonary vascular congestion. Aortic atherosclerosis is noted. No pneumothorax or pleural effusion. No focal bony abnormality. IMPRESSION: Pulmonary vascular congestion.  No focal abnormality. Electronically Signed   By: Drusilla Kanner M.D.   On: 01/26/2018 12:26    EKG: Independently reviewed. Sinus rhythm without acute changes  Assessment/Plan Active Problems:   Hypertrophic cardiomyopathy (HCC)   HTN (hypertension)   Gout   CAD (coronary artery disease)   Acute on chronic diastolic CHF (congestive heart failure) (HCC)   Rheumatoid arthritis (HCC)   Elevated troponin   Acute lower UTI   Abdominal pain   GERD (gastroesophageal reflux disease)     1. UTI. Start the patient on rocephin. Follow up further results of culture 2. Acute on chronic diastolic CHF. Patient did receive one dose of IV lasix in the ED. On exam, she does not appear to be particularly volume overloaded. Blood pressures are soft. Will continue lasix with oral dosing. With HOCM, would avoid overdiuresing. Will request cardiology input 3. Elevated  troponin. Possibly related to decompensated CHF. Will continue to cycle troponins. No acute EKG changes 4. Hyperbilirubinemia. Unclear if this is related to heart failure.  Will check RUQ ultrasound. She does have mild tenderness on palpation of RUQ 5. Rheumatoid arthritis. She is chronically on methotrexate 6. GERD. Continue on PPI and H2 blockers 7. HLD, continue on statin 8. HTN. She is chronically on coreg and verapamil. Will hold these medications for now while blood pressure is low. Lactic acid is normal and she does not appear to be toxic. Continue to monitor. 9. Hypokalemia. Replace. Check magnesium  DVT prophylaxis: lovenox  Code Status: full code  Family Communication: discussed with son at bedside who translated   Disposition Plan: discharge home once improved  Consults called: cardiology  Admission status: observation, tele   Erick Blinks MD [facility] Pager [phone]  If 7PM-7AM, please contact night-coverage [email or web address] Password [facility]  01/26/2018, 3:14 PM

## 2018-01-26 NOTE — Consult Note (Signed)
Cardiology Consultation:   Patient IDRoni Friberg; [id number]; 1940/07/04   Admit date: 01/26/2018 Date of Consult: 01/26/2018  Primary Care Provider: [name], MD Primary Cardiologist: Nona Dell, MD  Primary Electrophysiologist:  na   Patient Profile:   Yitta Gongaware is a 77 y.o. female with a hx of CAD and HOCM  who is being seen today for the evaluation of SOB and elevated troponin at the request of Dr Kerry Hough  History of Present Illness:   Ms. Caravello is a 77 yo female history of CAD with DES to OM1 with jailed side Arminda Foglio in 2018, HGN, HL, hypertropic CM. Seen in 10/2017 during admission for somewhat atypcal chest pain, discussiosn were had about possible cath however ultimately decided to treat medically and observe. Verapamil was increased for her HOCM. Presents with abdomiona/RUQ pain and SOB. Patient denies any chest pain. No recent LE edema.   History is collected with assistance of daughter who speaks english.    WBC 11.8 Hgb 11.9 Plt 197 K 2.8 Cr 1.13  Trop 0.31--> CXR pulm congestion 08/2017 echo LVEF 75-80%, peak obstructive gradiet 44 mmHg up to with valsalva. Grade II diastolic dysfunction.  EKG SR, chronic ST/T chagnes   Past Medical History:  Diagnosis Date  . CAD (coronary artery disease)    a. s/p DES to OM1 in 02/2017  . Essential hypertension   . Gout   . HOCM (hypertrophic obstructive cardiomyopathy) (HCC)   . Hyperlipidemia   . Rheumatoid arthritis [facility])     Past Surgical History:  Procedure Laterality Date  . CORONARY BALLOON ANGIOPLASTY N/A 03/03/2017   Procedure: CORONARY BALLOON ANGIOPLASTY;  Surgeon: Marykay Lex, MD;  Location: [facility] INVASIVE CV LAB;  Service: Cardiovascular;  Laterality: N/A;  . CORONARY STENT INTERVENTION N/A 03/03/2017   Procedure: CORONARY STENT INTERVENTION;  Surgeon: Marykay Lex, MD;  Location: [facility] INVASIVE CV LAB;  Service: Cardiovascular;  Laterality: N/A;  . LEFT HEART CATH AND CORONARY ANGIOGRAPHY N/A  03/02/2017   Procedure: LEFT HEART CATH AND CORONARY ANGIOGRAPHY;  Surgeon: Tonny Bollman, MD;  Location: [facility] INVASIVE CV LAB;  Service: Cardiovascular;  Laterality: N/A;     Inpatient Medications: Scheduled Meds:  Continuous Infusions: . potassium chloride 10 mEq (01/26/18 1442)   PRN Meds:   Allergies:   No Known Allergies  Social History:   Social History   Socioeconomic History  . Marital status: Widowed    Spouse name: Not on file  . Number of children: Not on file  . Years of education: Not on file  . Highest education level: Not on file  Occupational History  . Not on file  Social Needs  . Financial resource strain: Not on file  . Food insecurity:    Worry: Not on file    Inability: Not on file  . Transportation needs:    Medical: Not on file    Non-medical: Not on file  Tobacco Use  . Smoking status: Never Smoker  . Smokeless tobacco: Never Used  Substance and Sexual Activity  . Alcohol use: No  . Drug use: No  . Sexual activity: Not on file  Lifestyle  . Physical activity:    Days per week: Not on file    Minutes per session: Not on file  . Stress: Not on file  Relationships  . Social connections:    Talks on phone: Not on file    Gets together: Not on file    Attends religious service: Not on file  Active member of club or organization: Not on file    Attends meetings of clubs or organizations: Not on file    Relationship status: Not on file  . Intimate partner violence:    Fear of current or ex partner: Not on file    Emotionally abused: Not on file    Physically abused: Not on file    Forced sexual activity: Not on file  Other Topics Concern  . Not on file  Social History Narrative  . Not on file    Family History:    Family History  Problem Relation Age of Onset  . Hypertension Mother   . Liver disease Mother   . Alcohol abuse Father      ROS:  Please see the history of present illness.   All other ROS reviewed and negative.       Physical Exam/Data:   Vitals:   01/26/18 1300 01/26/18 1330 01/26/18 1500 01/26/18 1503  BP: (!) 95/51 (!) 100/55 (!) 98/57   Pulse: 70 65 66 68  Resp: (!) 31 (!) 26 (!) 33 (!) 23  Temp:      TempSrc:      SpO2: 91% 93% 94% 95%  Weight:      Height:        Intake/Output Summary (Last 24 hours) at 01/26/2018 1545 Last data filed at 01/26/2018 1309 Gross per 24 hour  Intake 100 ml  Output -  Net 100 ml   Filed Weights   01/26/18 1146  Weight: 173 lb (78.5 kg)   Body mass index is 28.79 kg/m.  General:  Well nourished, well developed, in no acute distress HEENT: normal Lymph: no adenopathy Neck: no JVD Endocrine:  No thryomegaly Cardiac:  normal S1, S2; 3/6 systolic murmur rusb, no jvd Lungs:  clear to auscultation bilaterally, no wheezing, rhonchi or rales  Abd: soft, nontender, no hepatomegaly  Ext: no edema Musculoskeletal:  No deformities, BUE and BLE strength normal and equal Skin: warm and dry  Neuro:  CNs 2-12 intact, no focal abnormalities noted Psych:  Normal affect   Laboratory Data:  Chemistry Recent Labs  Lab 01/24/18 1201 01/26/18 1209  NA 139 134*  K 3.5 2.8*  CL 104 100  CO2 28 25  GLUCOSE 124* 116*  BUN 14 18  CREATININE 0.69 1.13*  CALCIUM 8.7* 8.2*  GFRNONAA >60 46*  GFRAA >60 53*  ANIONGAP 7 9    Recent Labs  Lab 01/24/18 1201 01/26/18 1209  PROT 7.4 6.5  ALBUMIN 3.9 3.2*  AST 19 17  ALT 9 10  ALKPHOS 78 58  BILITOT 0.7 1.6*   Hematology Recent Labs  Lab 01/24/18 1201 01/26/18 1209  WBC 8.3 11.8*  RBC 4.22 4.11  HGB 12.2 11.9*  HCT 37.2 35.8*  MCV 88.2 87.1  MCH 28.9 29.0  MCHC 32.8 33.2  RDW 18.6* 18.5*  PLT 240 197   Cardiac Enzymes Recent Labs  Lab 01/26/18 1209  TROPONINI 0.31*   No results for input(s): TROPIPOC in the last 168 hours.  BNP Recent Labs  Lab 01/26/18 1209  BNP 1,106.0*    DDimer No results for input(s): DDIMER in the last 168 hours.  Radiology/Studies:  Dg Chest Port 1  View  Result Date: 01/26/2018 CLINICAL DATA:  Shortness of breath, particularly when lying down. EXAM: PORTABLE CHEST 1 VIEW COMPARISON:  PA and lateral chest 11/15/2017. FINDINGS: Heart size is upper normal and there is pulmonary vascular congestion. Aortic atherosclerosis is noted. No pneumothorax  or pleural effusion. No focal bony abnormality. IMPRESSION: Pulmonary vascular congestion.  No focal abnormality. Electronically Signed   By: Drusilla Kanner M.D.   On: 01/26/2018 12:26    Assessment and Plan:   1.Elevated troponin - patient presents with primarily abdominal pain, no specific cardiopulmonary symptoms - EKG LVH with chronic strain pattern. - iniital trop 0.31, BNP 1100.  - overall nonspecific troponin at this time in setting of UTI, possible GI issue - follow trend at this time, if significant increase may warrant ischemic testing once other issues have resolved.   - since original consult RUQ [place] concerning for cholecysitis. This appears to be demand ischemia.    2. Acute on chronic diastolic HF - Cr up to 1.13 on admit, baseline 0.7 - BNP and CXR would suggest she is volume overloaded. Exam not overally impressive, mild lung crackles.  - would avoid overaggressive diuresis in HOCM, which can increase her gradient and worsen her SOB. No further diuretics planned at this time unless recurrence of symptoms and supportive evidence of recurrent volume overload.    3. UTI - started on abx per primary team.   For questions or updates, please contact [facility] Please consult [email or web address] for contact info under Cardiology/STEMI.   Joanie Coddington, MD  01/26/2018 3:45 PM

## 2018-01-26 NOTE — ED Provider Notes (Signed)
[facility] Emergency Department Provider Note MRN:  [id number]  Arrival date & time: 01/26/18     Chief Complaint   Shortness of Breath   History of Present Illness   Dana [name] is a 77 y.o. year-old female with a history of HOCM, pretension, RA, CAD presenting to the ED with chief complaint of shortness of breath.  Patient's son-in-law explains that the patient was in the emergency department 2 days ago for epigastric pain.  The pain was quickly improved with medications and they were discharged with strict return precautions.  Since discharge, patient has been experiencing progressively worsening shortness of breath.  The shortness of breath is located in the chest, the sensation is constant but worse with laying flat.  No fevers or cough, no chest pain, abdominal pain is largely resolved.  No dysuria.  Review of Systems  A complete 10 system review of systems was obtained and all systems are negative except as noted in the HPI and PMH.   Patient's Health History    Past Medical History:  Diagnosis Date  . CAD (coronary artery disease)    a. s/p DES to OM1 in 02/2017  . Essential hypertension   . Gout   . HOCM (hypertrophic obstructive cardiomyopathy) (HCC)   . Hyperlipidemia   . Rheumatoid arthritis [facility])     Past Surgical History:  Procedure Laterality Date  . CORONARY BALLOON ANGIOPLASTY N/A 03/03/2017   Procedure: CORONARY BALLOON ANGIOPLASTY;  Surgeon: Marykay Lex, MD;  Location: [facility] INVASIVE CV LAB;  Service: Cardiovascular;  Laterality: N/A;  . CORONARY STENT INTERVENTION N/A 03/03/2017   Procedure: CORONARY STENT INTERVENTION;  Surgeon: Marykay Lex, MD;  Location: [facility] INVASIVE CV LAB;  Service: Cardiovascular;  Laterality: N/A;  . LEFT HEART CATH AND CORONARY ANGIOGRAPHY N/A 03/02/2017   Procedure: LEFT HEART CATH AND CORONARY ANGIOGRAPHY;  Surgeon: Tonny Bollman, MD;  Location: [facility] INVASIVE CV LAB;  Service: Cardiovascular;  Laterality: N/A;    Family History  Problem Relation Age of Onset  . Hypertension Mother   . Liver disease Mother   . Alcohol abuse Father     Social History   Socioeconomic History  . Marital status: Widowed    Spouse name: Not on file  . Number of children: Not on file  . Years of education: Not on file  . Highest education level: Not on file  Occupational History  . Not on file  Social Needs  . Financial resource strain: Not on file  . Food insecurity:    Worry: Not on file    Inability: Not on file  . Transportation needs:    Medical: Not on file    Non-medical: Not on file  Tobacco Use  . Smoking status: Never Smoker  . Smokeless tobacco: Never Used  Substance and Sexual Activity  . Alcohol use: No  . Drug use: No  . Sexual activity: Not on file  Lifestyle  . Physical activity:    Days per week: Not on file    Minutes per session: Not on file  . Stress: Not on file  Relationships  . Social connections:    Talks on phone: Not on file    Gets together: Not on file    Attends religious service: Not on file    Active member of club or organization: Not on file    Attends meetings of clubs or organizations: Not on file    Relationship status: Not on file  . Intimate partner  violence:    Fear of current or ex partner: Not on file    Emotionally abused: Not on file    Physically abused: Not on file    Forced sexual activity: Not on file  Other Topics Concern  . Not on file  Social History Narrative  . Not on file     Physical Exam  Vital Signs and Nursing Notes reviewed Vitals:   01/26/18 1500 01/26/18 1503  BP: (!) 98/57   Pulse: 66 68  Resp: (!) 33 (!) 23  Temp:    SpO2: 94% 95%    CONSTITUTIONAL: Well-appearing, NAD NEURO:  Alert and oriented x 3, no focal deficits EYES:  eyes equal and reactive ENT/NECK:  no LAD, no JVD CARDIO: Regular rate, well-perfused, normal S1 and S2 PULM:  CTAB no wheezing or rhonchi GI/GU:  normal bowel sounds, non-distended,  non-tender MSK/SPINE:  No gross deformities, no edema SKIN:  no rash, atraumatic PSYCH:  Appropriate speech and behavior  Diagnostic and Interventional Summary    EKG Interpretation  Date/Time:  Friday January 26 2018 11:47:22 EDT Ventricular Rate:  71 PR Interval:    QRS Duration: 97 QT Interval:  467 QTC Calculation: 508 R Axis:   12 Text Interpretation:  Sinus rhythm LVH with secondary repolarization abnormality Prolonged QT interval Confirmed by Kennis Carina [phone]) on 01/26/2018 12:16:47 PM      Labs Reviewed  CBC - Abnormal; Notable for the following components:      Result Value   WBC 11.8 (*)    Hemoglobin 11.9 (*)    HCT 35.8 (*)    RDW 18.5 (*)    All other components within normal limits  COMPREHENSIVE METABOLIC PANEL - Abnormal; Notable for the following components:   Sodium 134 (*)    Potassium 2.8 (*)    Glucose, Bld 116 (*)    Creatinine, Ser 1.13 (*)    Calcium 8.2 (*)    Albumin 3.2 (*)    Total Bilirubin 1.6 (*)    GFR calc non Af Amer 46 (*)    GFR calc Af Amer 53 (*)    All other components within normal limits  TROPONIN I - Abnormal; Notable for the following components:   Troponin I 0.31 (*)    All other components within normal limits  BRAIN NATRIURETIC PEPTIDE - Abnormal; Notable for the following components:   B Natriuretic Peptide 1,106.0 (*)    All other components within normal limits  LACTIC ACID, PLASMA  LACTIC ACID, PLASMA  MAGNESIUM    DG Chest Port 1 View  Final Result      Medications  potassium chloride 10 mEq in 100 mL IVPB (10 mEq Intravenous New Bag/Given 01/26/18 1442)  cefTRIAXone (ROCEPHIN) 1 g in sodium chloride 0.9 % 100 mL IVPB (0 g Intravenous Stopped 01/26/18 1309)  furosemide (LASIX) injection 40 mg (40 mg Intravenous Given 01/26/18 1308)     Procedures Critical Care   ED Course and Medical Decision Making  I have reviewed the triage vital signs and the nursing notes.  Pertinent labs & imaging results that were  available during my care of the patient were reviewed by me and considered in my medical decision making (see below for details). Clinical Course as of Jan 27 1532  Fri Jan 26, 2018  [id number] 77 year old female history of HOCM here with shortness of breath worse with lying flat.  Vital signs stable, in no acute distress, favoring CHF exacerbation.  Labs from ED visit 2  days ago reveal urine culture growing gram-negative rods, will cover empirically with ceftriaxone.  Chest x-ray and labs pending.   [MB]  1301 Labs reveal elevated troponin and elevated BNP consistent with CHF exacerbation.  Will admit to hospital service.  40 mg Lasix.   [MB]  1302 Patient with soft blood pressures in the 80s systolic, however this is in the setting of peaceful sleep.  Patient with strong radial pulses, wakes to voice and is completely appropriate with her mental status.  Will closely monitor.   [MB]    Clinical Course User Index [MB] Sabas Sous, MD    Admitted to hospital service.  Elmer Sow. Pilar Plate, MD [facility] [email or web address]  Final Clinical Impressions(s) / ED Diagnoses     ICD-10-CM   1. Acute on chronic congestive heart failure, unspecified heart failure type (HCC) I50.9   2. SOB (shortness of breath) R06.02 DG Chest [facility] 1 View    DG Chest [place] 1 View  3. Urinary tract infection without hematuria, site unspecified N39.0     ED Discharge Orders    None         Sabas Sous, MD 01/26/18 [phone]

## 2018-01-27 DIAGNOSIS — M109 Gout, unspecified: Secondary | ICD-10-CM | POA: Diagnosis present

## 2018-01-27 DIAGNOSIS — R1013 Epigastric pain: Secondary | ICD-10-CM | POA: Diagnosis present

## 2018-01-27 DIAGNOSIS — R0602 Shortness of breath: Secondary | ICD-10-CM | POA: Diagnosis present

## 2018-01-27 DIAGNOSIS — K219 Gastro-esophageal reflux disease without esophagitis: Secondary | ICD-10-CM | POA: Diagnosis present

## 2018-01-27 DIAGNOSIS — I110 Hypertensive heart disease with heart failure: Secondary | ICD-10-CM | POA: Diagnosis not present

## 2018-01-27 DIAGNOSIS — R748 Abnormal levels of other serum enzymes: Secondary | ICD-10-CM | POA: Diagnosis not present

## 2018-01-27 DIAGNOSIS — E785 Hyperlipidemia, unspecified: Secondary | ICD-10-CM | POA: Diagnosis present

## 2018-01-27 DIAGNOSIS — Z7982 Long term (current) use of aspirin: Secondary | ICD-10-CM | POA: Diagnosis not present

## 2018-01-27 DIAGNOSIS — N390 Urinary tract infection, site not specified: Secondary | ICD-10-CM | POA: Diagnosis present

## 2018-01-27 DIAGNOSIS — I5032 Chronic diastolic (congestive) heart failure: Secondary | ICD-10-CM | POA: Diagnosis not present

## 2018-01-27 DIAGNOSIS — K8020 Calculus of gallbladder without cholecystitis without obstruction: Secondary | ICD-10-CM | POA: Diagnosis not present

## 2018-01-27 DIAGNOSIS — I5033 Acute on chronic diastolic (congestive) heart failure: Secondary | ICD-10-CM | POA: Diagnosis present

## 2018-01-27 DIAGNOSIS — I421 Obstructive hypertrophic cardiomyopathy: Secondary | ICD-10-CM | POA: Diagnosis present

## 2018-01-27 DIAGNOSIS — I2510 Atherosclerotic heart disease of native coronary artery without angina pectoris: Secondary | ICD-10-CM | POA: Diagnosis present

## 2018-01-27 DIAGNOSIS — Z955 Presence of coronary angioplasty implant and graft: Secondary | ICD-10-CM | POA: Diagnosis not present

## 2018-01-27 DIAGNOSIS — E876 Hypokalemia: Secondary | ICD-10-CM | POA: Diagnosis present

## 2018-01-27 DIAGNOSIS — Z7902 Long term (current) use of antithrombotics/antiplatelets: Secondary | ICD-10-CM | POA: Diagnosis not present

## 2018-01-27 DIAGNOSIS — R1011 Right upper quadrant pain: Secondary | ICD-10-CM | POA: Diagnosis not present

## 2018-01-27 DIAGNOSIS — Z7901 Long term (current) use of anticoagulants: Secondary | ICD-10-CM | POA: Diagnosis not present

## 2018-01-27 DIAGNOSIS — I10 Essential (primary) hypertension: Secondary | ICD-10-CM | POA: Diagnosis not present

## 2018-01-27 DIAGNOSIS — M069 Rheumatoid arthritis, unspecified: Secondary | ICD-10-CM | POA: Diagnosis present

## 2018-01-27 DIAGNOSIS — Z79899 Other long term (current) drug therapy: Secondary | ICD-10-CM | POA: Diagnosis not present

## 2018-01-27 DIAGNOSIS — B9620 Unspecified Escherichia coli [E. coli] as the cause of diseases classified elsewhere: Secondary | ICD-10-CM | POA: Diagnosis present

## 2018-01-27 LAB — COMPREHENSIVE METABOLIC PANEL
ALBUMIN: 2.8 g/dL — AB (ref 3.5–5.0)
ALT: 9 U/L (ref 0–44)
ANION GAP: 6 (ref 5–15)
AST: 19 U/L (ref 15–41)
Alkaline Phosphatase: 49 U/L (ref 38–126)
BUN: 20 mg/dL (ref 8–23)
CHLORIDE: 105 mmol/L (ref 98–111)
CO2: 26 mmol/L (ref 22–32)
Calcium: 7.9 mg/dL — ABNORMAL LOW (ref 8.9–10.3)
Creatinine, Ser: 0.9 mg/dL (ref 0.44–1.00)
GFR calc Af Amer: >60 mL/min (ref >60)
GFR calc non Af Amer: >60 mL/min (ref >60)
GLUCOSE: 90 mg/dL (ref 70–99)
Potassium: 3.7 mmol/L (ref 3.5–5.1)
SODIUM: 137 mmol/L (ref 135–145)
Total Bilirubin: 0.6 mg/dL (ref 0.3–1.2)
Total Protein: 5.9 g/dL — ABNORMAL LOW (ref 6.5–8.1)

## 2018-01-27 LAB — URINE CULTURE: Culture: >=100000 — AB

## 2018-01-27 LAB — CBC
HCT: 33 % — ABNORMAL LOW (ref 36.0–46.0)
HEMOGLOBIN: 10.7 g/dL — AB (ref 12.0–15.0)
MCH: 28.4 pg (ref 26.0–34.0)
MCHC: 32.4 g/dL (ref 30.0–36.0)
MCV: 87.5 fL (ref 78.0–100.0)
Platelets: 189 10*3/uL (ref 150–400)
RBC: 3.77 MIL/uL — ABNORMAL LOW (ref 3.87–5.11)
RDW: 18.8 % — ABNORMAL HIGH (ref 11.5–15.5)
WBC: 7 10*3/uL (ref 4.0–10.5)

## 2018-01-27 LAB — TROPONIN I: Troponin I: 0.4 ng/mL (ref <0.03)

## 2018-01-27 MED ORDER — GUAIFENESIN-DM 100-10 MG/5ML PO SYRP
5.0000 mL | ORAL_SOLUTION | ORAL | Status: DC | PRN
  Administered 2018-01-27: 5 mL via ORAL
  Filled 2018-01-27: qty 5

## 2018-01-27 NOTE — Progress Notes (Signed)
Greeted patient and family. Interpreter line had no available interpreters for patient samoan language. Ms Kaufman adult daughter assisted with explaining to patient medications.

## 2018-01-27 NOTE — Progress Notes (Signed)
PROGRESS NOTE    Dana [name]  ULA:[id number] DOB: 16-Aug-1940 DOA: 01/26/2018 PCP: [name], MD    Brief Narrative:  77 year old female with a history of hypertrophic cardiomyopathy, rheumatoid arthritis, hypertension, hyperlipidemia, presented to the hospital with right upper quadrant pain shortness of breath.  She is going to have mild exacerbation of CHF which was treated with intravenous Lasix.  Overall shortness of breath has now resolved.  She has been having right upper quadrant pain for the past 3 to 4 days.  Work-up indicated transient elevation of bilirubin with abdominal ultrasound indicating cholelithiasis and possible cholecystitis.  General surgery consultation is been requested.   Assessment & Plan:   Active Problems:   Hypertrophic cardiomyopathy (HCC)   HTN (hypertension)   Gout   CAD (coronary artery disease)   Acute on chronic diastolic CHF (congestive heart failure) (HCC)   Rheumatoid arthritis (HCC)   Elevated troponin   Acute lower UTI   Abdominal pain   GERD (gastroesophageal reflux disease)   1. Acute on chronic diastolic congestive heart failure.  Received 1 dose of Lasix and overall volume status has improved.  She is continued on her home dose of oral Lasix.  With a history of hypertrophic cardiomyopathy, would avoid over diuresing.  Cardiology input appreciated. 2. Abdominal pain possibly related to acute cholecystitis.  Patient has been complaining of right upper quadrant pain.  Initially on admission, bilirubin was noted to be elevated, although this has improved.  Right upper quadrant ultrasound did indicate cholelithiasis as well as possible cholecystitis.  Patient has been having recurrent right upper quadrant abdominal pain.  Will request general surgery to evaluate for possible cholecystectomy.  Continue on ceftriaxone. 3. Urinary tract infection.  Urine culture positive for E. coli.  She is on appropriate antibiotics. 4. Rheumatoid arthritis.  She is  currently on methotrexate.  No evidence of flare. 5. Elevated troponin.  Seen by cardiology, felt to be demand ischemia in the setting of #1 #2.  No acute EKG changes.  No chest pain.  No further work-up planned at this time. 6. GERD.  Continue on PPI and H2 blockers 7. Hyperlipidemia.  Continue on statin 8. Hypertension.  Was low on admission, so home dose of Coreg and verapamil have been held.  She is now normotensive. 9. Hypokalemia.  Improved with replacement.  Magnesium was normal.   DVT prophylaxis: lovenox Code Status: full code Family Communication: discussed with son and daughter at the bedside Disposition Plan: discharge home once improved   Consultants:   General surgery  Procedures:     Antimicrobials:   Ceftriaxone 8/2>    Subjective: Shortness of breath is better. No chest pain. Still has RUQ pain, which is worse when she coughs  Objective: Vitals:   01/26/18 1604 01/26/18 2309 01/27/18 0642 01/27/18 1308  BP: 91/65 (!) 102/57 (!) 117/58 117/73  Pulse: 69 74 65 66  Resp: 20 20 14 16   Temp: 98.7 F (37.1 C) 99 F (37.2 C) 98.3 F (36.8 C) 98.1 F (36.7 C)  TempSrc: Oral Oral Oral Oral  SpO2: 100% 96% 97% 97%  Weight: 79.3 kg (174 lb 14.4 oz)     Height: 5\' 2"  (1.575 m)       Intake/Output Summary (Last 24 hours) at 01/27/2018 1827 Last data filed at 01/27/2018 1700 Gross per 24 hour  Intake 580 ml  Output 1000 ml  Net -420 ml   Filed Weights   01/26/18 1146 01/26/18 1604  Weight: 78.5 kg (173 lb)  79.3 kg (174 lb 14.4 oz)    Examination:  General exam: Appears calm and comfortable  Respiratory system: crackles at bases. Respiratory effort normal. Cardiovascular system: S1 & S2 heard, RRR. No JVD, murmurs, rubs, gallops or clicks. No pedal edema. Gastrointestinal system: Abdomen is nondistended, soft and tender in right upper quadrant. No organomegaly or masses felt. Normal bowel sounds heard. Central nervous system: Alert and oriented. No focal  neurological deficits. Extremities: Symmetric 5 x 5 power. Skin: No rashes, lesions or ulcers Psychiatry: Judgement and insight appear normal. Mood & affect appropriate.     Data Reviewed: I have personally reviewed following labs and imaging studies  CBC: Recent Labs  Lab 01/24/18 1201 01/26/18 1209 01/27/18 0354  WBC 8.3 11.8* 7.0  NEUTROABS 6.7  --   --   HGB 12.2 11.9* 10.7*  HCT 37.2 35.8* 33.0*  MCV 88.2 87.1 87.5  PLT 240 197 189   Basic Metabolic Panel: Recent Labs  Lab 01/24/18 1201 01/26/18 1209 01/26/18 1336 01/27/18 0354  NA 139 134*  --  137  K 3.5 2.8*  --  3.7  CL 104 100  --  105  CO2 28 25  --  26  GLUCOSE 124* 116*  --  90  BUN 14 18  --  20  CREATININE 0.69 1.13*  --  0.90  CALCIUM 8.7* 8.2*  --  7.9*  MG  --   --  1.9  --    GFR: Estimated Creatinine Clearance: 51.9 mL/min (by C-G formula based on SCr of 0.9 mg/dL). Liver Function Tests: Recent Labs  Lab 01/24/18 1201 01/26/18 1209 01/27/18 0354  AST 19 17 19   ALT 9 10 9   ALKPHOS 78 58 49  BILITOT 0.7 1.6* 0.6  PROT 7.4 6.5 5.9*  ALBUMIN 3.9 3.2* 2.8*   Recent Labs  Lab 01/24/18 1201  LIPASE 36   No results for input(s): AMMONIA in the last 168 hours. Coagulation Profile: No results for input(s): INR, PROTIME in the last 168 hours. Cardiac Enzymes: Recent Labs  Lab 01/26/18 1209 01/26/18 2221 01/27/18 0353  TROPONINI 0.31* 0.46* 0.40*   BNP (last 3 results) No results for input(s): PROBNP in the last 8760 hours. HbA1C: No results for input(s): HGBA1C in the last 72 hours. CBG: No results for input(s): GLUCAP in the last 168 hours. Lipid Profile: No results for input(s): CHOL, HDL, LDLCALC, TRIG, CHOLHDL, LDLDIRECT in the last 72 hours. Thyroid Function Tests: No results for input(s): TSH, T4TOTAL, FREET4, T3FREE, THYROIDAB in the last 72 hours. Anemia Panel: No results for input(s): VITAMINB12, FOLATE, FERRITIN, TIBC, IRON, RETICCTPCT in the last 72 hours. Sepsis  Labs: Recent Labs  Lab 01/26/18 1336  LATICACIDVEN 0.9    Recent Results (from the past 240 hour(s))  Urine culture     Status: Abnormal   Collection Time: 01/24/18 12:01 PM  Result Value Ref Range Status   Specimen Description   Final    URINE, RANDOM Performed at [facility], [address], [place], [address] [place]    Special Requests   Final    NONE Performed at [facility], [address] [place], [address] [place]    Culture >=100,000 COLONIES/mL ESCHERICHIA COLI (A)  Final   Report Status 01/27/2018 FINAL  Final   Organism ID, Bacteria ESCHERICHIA COLI (A)  Final      Susceptibility   Escherichia coli - MIC*    AMPICILLIN >=32 RESISTANT Resistant     CEFAZOLIN <=4 SENSITIVE Sensitive  CEFTRIAXONE <=1 SENSITIVE Sensitive     CIPROFLOXACIN <=0.25 SENSITIVE Sensitive     GENTAMICIN <=1 SENSITIVE Sensitive     IMIPENEM 0.5 SENSITIVE Sensitive     NITROFURANTOIN 32 SENSITIVE Sensitive     TRIMETH/SULFA <=20 SENSITIVE Sensitive     AMPICILLIN/SULBACTAM >=32 RESISTANT Resistant     PIP/TAZO <=4 SENSITIVE Sensitive     Extended ESBL NEGATIVE Sensitive     * >=100,000 COLONIES/mL ESCHERICHIA COLI         Radiology Studies: Dg Chest Port 1 View  Result Date: 01/26/2018 CLINICAL DATA:  Shortness of breath, particularly when lying down. EXAM: PORTABLE CHEST 1 VIEW COMPARISON:  PA and lateral chest 11/15/2017. FINDINGS: Heart size is upper normal and there is pulmonary vascular congestion. Aortic atherosclerosis is noted. No pneumothorax or pleural effusion. No focal bony abnormality. IMPRESSION: Pulmonary vascular congestion.  No focal abnormality. Electronically Signed   By: Drusilla Kanner M.D.   On: 01/26/2018 12:26   US Abdomen Limited Ruq  Result Date: 01/26/2018 CLINICAL DATA:  RIGHT upper quadrant pain, history hypertension, coronary artery disease, gout, hypertrophic obstructive cardiomyopathy, rheumatoid arthritis EXAM: ULTRASOUND ABDOMEN LIMITED RIGHT UPPER  QUADRANT COMPARISON:  None FINDINGS: Gallbladder: Multiple tiny layering gallstones within gallbladder. Small amount of sludge within gallbladder. Mild gallbladder wall thickening. Sonographic Murphy sign present. No definite pericholecystic fluid. Common bile duct: Diameter: 7 mm diameter, upper normal Liver: Suboptimally evaluated due to bowel gas and body habitus. Grossly normal echogenicity. No obvious mass identified on suboptimal assessment. Portal vein is patent on color Doppler imaging with normal direction of blood flow towards the liver. No RIGHT upper quadrant free fluid. IMPRESSION: Cholelithiasis and sludge within gallbladder associated with gallbladder wall thickening and a sonographic Murphy sign suspicious for acute cholecystitis. Electronically Signed   By: Ulyses Southward M.D.   On: 01/26/2018 16:55        Scheduled Meds: . aspirin EC  81 mg Oral Daily  . atorvastatin  10 mg Oral Daily  . clopidogrel  75 mg Oral Daily  . cycloSPORINE  1 drop Both Eyes BID  . enoxaparin (LOVENOX) injection  40 mg Subcutaneous Q24H  . ezetimibe  10 mg Oral Daily  . famotidine  20 mg Oral QHS  . fluticasone  2 spray Each Nare Daily  . folic acid  1 mg Oral Daily  . furosemide  40 mg Oral Daily  . methotrexate  15 mg Oral Weekly  . pantoprazole  40 mg Oral Daily  . sucralfate  1 g Oral TID WC & HS   Continuous Infusions: . cefTRIAXone (ROCEPHIN)  IV Stopped (01/27/18 1233)     LOS: 0 days    Time spent:    Erick Blinks, MD [facility] Pager [phone]  If 7PM-7AM, please contact night-coverage [email or web address] Password [email or web address] 01/27/2018, 6:27 PM

## 2018-01-27 NOTE — Progress Notes (Addendum)
Rounding completed. Patient lying supine in bed, no distress noted, family in room. Call bell within reach, bed in low position. Will continue to monitor.   @ 0404 rounding completed. Patient sleeping supine, no distress noted, family in room. Call bell within reach, bed in low position. Will continue to monitor.

## 2018-01-27 NOTE — Progress Notes (Signed)
Plan of care reviewed with patient via pacific interpreter Abby (WATT). Pt had c/o pain to abdomin with cough. Cough medication ordered.

## 2018-01-28 ENCOUNTER — Telehealth: Payer: Self-pay | Admitting: Emergency Medicine

## 2018-01-28 ENCOUNTER — Encounter (HOSPITAL_COMMUNITY): Payer: Self-pay | Admitting: Family Medicine

## 2018-01-28 DIAGNOSIS — R1011 Right upper quadrant pain: Secondary | ICD-10-CM

## 2018-01-28 DIAGNOSIS — I5033 Acute on chronic diastolic (congestive) heart failure: Secondary | ICD-10-CM

## 2018-01-28 DIAGNOSIS — I422 Other hypertrophic cardiomyopathy: Secondary | ICD-10-CM

## 2018-01-28 DIAGNOSIS — K219 Gastro-esophageal reflux disease without esophagitis: Secondary | ICD-10-CM

## 2018-01-28 DIAGNOSIS — N390 Urinary tract infection, site not specified: Secondary | ICD-10-CM

## 2018-01-28 DIAGNOSIS — I2510 Atherosclerotic heart disease of native coronary artery without angina pectoris: Secondary | ICD-10-CM

## 2018-01-28 DIAGNOSIS — I10 Essential (primary) hypertension: Secondary | ICD-10-CM

## 2018-01-28 LAB — COMPREHENSIVE METABOLIC PANEL
ALBUMIN: 2.9 g/dL — AB (ref 3.5–5.0)
ALT: 11 U/L (ref 0–44)
ANION GAP: 8 (ref 5–15)
AST: 17 U/L (ref 15–41)
Alkaline Phosphatase: 48 U/L (ref 38–126)
BILIRUBIN TOTAL: 0.6 mg/dL (ref 0.3–1.2)
BUN: 16 mg/dL (ref 8–23)
CO2: 25 mmol/L (ref 22–32)
Calcium: 8.3 mg/dL — ABNORMAL LOW (ref 8.9–10.3)
Chloride: 104 mmol/L (ref 98–111)
Creatinine, Ser: 0.78 mg/dL (ref 0.44–1.00)
GFR calc Af Amer: >60 mL/min (ref >60)
Glucose, Bld: 94 mg/dL (ref 70–99)
POTASSIUM: 3.3 mmol/L — AB (ref 3.5–5.1)
Sodium: 137 mmol/L (ref 135–145)
TOTAL PROTEIN: 6 g/dL — AB (ref 6.5–8.1)

## 2018-01-28 LAB — MAGNESIUM: MAGNESIUM: 1.9 mg/dL (ref 1.7–2.4)

## 2018-01-28 MED ORDER — POTASSIUM CHLORIDE CRYS ER 20 MEQ PO TBCR
40.0000 meq | EXTENDED_RELEASE_TABLET | Freq: Once | ORAL | Status: AC
  Administered 2018-01-28: 40 meq via ORAL
  Filled 2018-01-28: qty 2

## 2018-01-28 NOTE — Progress Notes (Signed)
PROGRESS NOTE    Dana [name]  HOZ:[id number] DOB: 1940-12-19 DOA: 01/26/2018 PCP: [name], MD    Brief Narrative:  77 year old female with a history of hypertrophic cardiomyopathy, rheumatoid arthritis, hypertension, hyperlipidemia, presented to the hospital with right upper quadrant pain shortness of breath.  She is going to have mild exacerbation of CHF which was treated with intravenous Lasix.  Overall shortness of breath has now resolved.  She has been having right upper quadrant pain for the past 3 to 4 days.  Work-up indicated transient elevation of bilirubin with abdominal ultrasound indicating cholelithiasis and possible cholecystitis.  General surgery consultation is been requested.  Assessment & Plan:   Active Problems:   Hypertrophic cardiomyopathy (HCC)   HTN (hypertension)   Gout   CAD (coronary artery disease)   Acute on chronic diastolic CHF (congestive heart failure) (HCC)   Rheumatoid arthritis (HCC)   Elevated troponin   Acute lower UTI   Abdominal pain   GERD (gastroesophageal reflux disease)   RUQ pain  1. Acute on chronic diastolic congestive heart failure.  With a history of hypertrophic cardiomyopathy, would avoid over diuresing.  Cardiology input appreciated.  She has been restarted on her home oral lasix dose.  2. Abdominal pain possibly related to biliary colic.  General surgery evaluated her and there is no need for surgical intervention at this time.  She would be evaluated for a possible cholecystostomy in future if she continued to have problems with biliary colic.   3. Urinary tract infection.  Urine culture positive for E. coli.  She is on appropriate antibiotics. 4. Rheumatoid arthritis.  She is currently on methotrexate.  No evidence of flare. 5. Elevated troponin.  Seen by cardiology, felt to be demand ischemia in the setting of #1 #2.  No acute EKG changes.  No chest pain.  No further work-up planned at this time. 6. GERD.  Continue on PPI and H2  blockers 7. Hyperlipidemia.  Continue on statin 8. Hypertension.  Was low on admission, so home dose of Coreg and verapamil have been held.  She is now normotensive. 9. Hypokalemia. Oral replacement ordered.   DVT prophylaxis: lovenox Code Status: full code Family Communication: discussed with son at the bedside Disposition Plan: discharge home once improved, possibly tomorrow if remains stable  Consultants:   General surgery  Procedures:   N/A  Antimicrobials:   Ceftriaxone 8/2>    Subjective: Pt has been eating breakfast with no abdominal pain.    Objective: Vitals:   01/27/18 0642 01/27/18 1308 01/27/18 2309 01/28/18 0603  BP: (!) 117/58 117/73 132/69 (!) 153/74  Pulse: 65 66 68 61  Resp: 14 16 16 12   Temp: 98.3 F (36.8 C) 98.1 F (36.7 C) 98.6 F (37 C) 98.3 F (36.8 C)  TempSrc: Oral Oral Oral Oral  SpO2: 97% 97% 96% 94%  Weight:      Height:        Intake/Output Summary (Last 24 hours) at 01/28/2018 1030 Last data filed at 01/27/2018 2100 Gross per 24 hour  Intake 820 ml  Output 1000 ml  Net -180 ml   Filed Weights   01/26/18 1146 01/26/18 1604  Weight: 78.5 kg (173 lb) 79.3 kg (174 lb 14.4 oz)    Examination:  General exam: Appears calm and comfortable.  She has eaten breakfast with no problems.  Respiratory system: clear to auscultation. Respiratory effort normal. Cardiovascular system: S1 & S2 heard.   No JVD, murmurs, rubs, gallops or clicks. No pedal  edema. Gastrointestinal system: Abdomen is nondistended, soft and tender in right upper quadrant. No organomegaly or masses felt. Normal bowel sounds heard. Central nervous system: Alert and oriented. No focal neurological deficits. Extremities: Symmetric 5 x 5 power.  No pretibial edema.   Skin: No rashes, lesions or ulcers Psychiatry: Judgement and insight appear normal. Mood & affect appropriate.   Data Reviewed: I have personally reviewed following labs and imaging studies  CBC: Recent Labs   Lab 01/24/18 1201 01/26/18 1209 01/27/18 0354  WBC 8.3 11.8* 7.0  NEUTROABS 6.7  --   --   HGB 12.2 11.9* 10.7*  HCT 37.2 35.8* 33.0*  MCV 88.2 87.1 87.5  PLT 240 197 189   Basic Metabolic Panel: Recent Labs  Lab 01/24/18 1201 01/26/18 1209 01/26/18 1336 01/27/18 0354 01/28/18 0621  NA 139 134*  --  137 137  K 3.5 2.8*  --  3.7 3.3*  CL 104 100  --  105 104  CO2 28 25  --  26 25  GLUCOSE 124* 116*  --  90 94  BUN 14 18  --  20 16  CREATININE 0.69 1.13*  --  0.90 0.78  CALCIUM 8.7* 8.2*  --  7.9* 8.3*  MG  --   --  1.9  --   --    GFR: Estimated Creatinine Clearance: 58.4 mL/min (by C-G formula based on SCr of 0.78 mg/dL). Liver Function Tests: Recent Labs  Lab 01/24/18 1201 01/26/18 1209 01/27/18 0354 01/28/18 0621  AST 19 17 19 17   ALT 9 10 9 11   ALKPHOS 78 58 49 48  BILITOT 0.7 1.6* 0.6 0.6  PROT 7.4 6.5 5.9* 6.0*  ALBUMIN 3.9 3.2* 2.8* 2.9*   Recent Labs  Lab 01/24/18 1201  LIPASE 36   No results for input(s): AMMONIA in the last 168 hours. Coagulation Profile: No results for input(s): INR, PROTIME in the last 168 hours. Cardiac Enzymes: Recent Labs  Lab 01/26/18 1209 01/26/18 2221 01/27/18 0353  TROPONINI 0.31* 0.46* 0.40*   BNP (last 3 results) No results for input(s): PROBNP in the last 8760 hours. HbA1C: No results for input(s): HGBA1C in the last 72 hours. CBG: No results for input(s): GLUCAP in the last 168 hours. Lipid Profile: No results for input(s): CHOL, HDL, LDLCALC, TRIG, CHOLHDL, LDLDIRECT in the last 72 hours. Thyroid Function Tests: No results for input(s): TSH, T4TOTAL, FREET4, T3FREE, THYROIDAB in the last 72 hours. Anemia Panel: No results for input(s): VITAMINB12, FOLATE, FERRITIN, TIBC, IRON, RETICCTPCT in the last 72 hours. Sepsis Labs: Recent Labs  Lab 01/26/18 1336  LATICACIDVEN 0.9    Recent Results (from the past 240 hour(s))  Urine culture     Status: Abnormal   Collection Time: 01/24/18 12:01 PM    Result Value Ref Range Status   Specimen Description   Final    URINE, RANDOM Performed at [facility], [address] [place], [place] [address]    Special Requests   Final    NONE Performed at [facility], [address], [place], [place] [address]    Culture >=100,000 COLONIES/mL ESCHERICHIA COLI (A)  Final   Report Status 01/27/2018 FINAL  Final   Organism ID, Bacteria ESCHERICHIA COLI (A)  Final      Susceptibility   Escherichia coli - MIC*    AMPICILLIN >=32 RESISTANT Resistant     CEFAZOLIN <=4 SENSITIVE Sensitive     CEFTRIAXONE <=1 SENSITIVE Sensitive     CIPROFLOXACIN <=0.25 SENSITIVE Sensitive  GENTAMICIN <=1 SENSITIVE Sensitive     IMIPENEM 0.5 SENSITIVE Sensitive     NITROFURANTOIN 32 SENSITIVE Sensitive     TRIMETH/SULFA <=20 SENSITIVE Sensitive     AMPICILLIN/SULBACTAM >=32 RESISTANT Resistant     PIP/TAZO <=4 SENSITIVE Sensitive     Extended ESBL NEGATIVE Sensitive     * >=100,000 COLONIES/mL ESCHERICHIA COLI    Radiology Studies: Dg Chest Port 1 View  Result Date: 01/26/2018 CLINICAL DATA:  Shortness of breath, particularly when lying down. EXAM: PORTABLE CHEST 1 VIEW COMPARISON:  PA and lateral chest 11/15/2017. FINDINGS: Heart size is upper normal and there is pulmonary vascular congestion. Aortic atherosclerosis is noted. No pneumothorax or pleural effusion. No focal bony abnormality. IMPRESSION: Pulmonary vascular congestion.  No focal abnormality. Electronically Signed   By: Drusilla Kanner M.D.   On: 01/26/2018 12:26   US Abdomen Limited Ruq  Result Date: 01/26/2018 CLINICAL DATA:  RIGHT upper quadrant pain, history hypertension, coronary artery disease, gout, hypertrophic obstructive cardiomyopathy, rheumatoid arthritis EXAM: ULTRASOUND ABDOMEN LIMITED RIGHT UPPER QUADRANT COMPARISON:  None FINDINGS: Gallbladder: Multiple tiny layering gallstones within gallbladder. Small amount of sludge within gallbladder. Mild gallbladder wall thickening. Sonographic  Murphy sign present. No definite pericholecystic fluid. Common bile duct: Diameter: 7 mm diameter, upper normal Liver: Suboptimally evaluated due to bowel gas and body habitus. Grossly normal echogenicity. No obvious mass identified on suboptimal assessment. Portal vein is patent on color Doppler imaging with normal direction of blood flow towards the liver. No RIGHT upper quadrant free fluid. IMPRESSION: Cholelithiasis and sludge within gallbladder associated with gallbladder wall thickening and a sonographic Murphy sign suspicious for acute cholecystitis. Electronically Signed   By: Ulyses Southward M.D.   On: 01/26/2018 16:55   Scheduled Meds: . aspirin EC  81 mg Oral Daily  . atorvastatin  10 mg Oral Daily  . clopidogrel  75 mg Oral Daily  . cycloSPORINE  1 drop Both Eyes BID  . enoxaparin (LOVENOX) injection  40 mg Subcutaneous Q24H  . ezetimibe  10 mg Oral Daily  . famotidine  20 mg Oral QHS  . fluticasone  2 spray Each Nare Daily  . folic acid  1 mg Oral Daily  . furosemide  40 mg Oral Daily  . methotrexate  15 mg Oral Weekly  . pantoprazole  40 mg Oral Daily  . potassium chloride  40 mEq Oral Once  . sucralfate  1 g Oral TID WC & HS   Continuous Infusions: . cefTRIAXone (ROCEPHIN)  IV Stopped (01/27/18 1233)    LOS: 1 day   Time spent: 30 mins  Standley Dakins, MD [facility] Pager [phone]  If 7PM-7AM, please contact night-coverage [email or web address] Password [email or web address] 01/28/2018, 10:30 AM

## 2018-01-28 NOTE — Telephone Encounter (Signed)
Post ED Visit - Positive Culture Follow-up  Culture report reviewed by antimicrobial stewardship pharmacist:  []  , Pharm.D. []  Enzo Bi, Pharm.D., BCPS AQ-ID []  , Pharm.D., BCPS []  Celedonio Miyamoto, Pharm.D., BCPS []  Hendrum, Garvin Fila.D., BCPS, AAHIVP []  , Pharm.D., BCPS, AAHIVP []  Georgina Pillion, PharmD, BCPS []  , PharmD, BCPS [x]  [place], PharmD, BCPS []  [address], PharmD  Positive urine culture, current inpatient at [facility], no further patient follow-up is required at this time.  Estella Husk 01/28/2018, 11:57 AM

## 2018-01-28 NOTE — Consult Note (Signed)
Reason for Consult: Right upper quadrant abdominal pain, cholelithiasis Referring Physician: Dr. Kristen Cardinal is an 77 y.o. female.  HPI: Patient is a 77 year old Hispanic female who presented to the hospital with right upper quadrant abdominal pain and shortness of breath.  She states through her son that she started having right upper quadrant discomfort 3 to 4 days ago.  She denies any nausea.  The pain radiated down to the lower abdomen.  She denies any fever or chills.  She currently has minimal abdominal pain.  She did have some leg swelling, but this has since gotten better.  She is not short of breath.  She has been eating a regular diet over the past 2 days without difficulty.  She denies any fatty food intolerance or jaundice.  Past Medical History:  Diagnosis Date  . CAD (coronary artery disease)    a. s/p DES to OM1 in 02/2017  . Essential hypertension   . Gout   . HOCM (hypertrophic obstructive cardiomyopathy) ([place])   . Hyperlipidemia   . Rheumatoid arthritis [facility])     Past Surgical History:  Procedure Laterality Date  . CORONARY BALLOON ANGIOPLASTY N/A 03/03/2017   Procedure: CORONARY BALLOON ANGIOPLASTY;  Surgeon: Leonie Man, MD;  Location: [facility];  Service: Cardiovascular;  Laterality: N/A;  . CORONARY STENT INTERVENTION N/A 03/03/2017   Procedure: CORONARY STENT INTERVENTION;  Surgeon: Leonie Man, MD;  Location: [facility];  Service: Cardiovascular;  Laterality: N/A;  . LEFT HEART CATH AND CORONARY ANGIOGRAPHY N/A 03/02/2017   Procedure: LEFT HEART CATH AND CORONARY ANGIOGRAPHY;  Surgeon: Sherren Mocha, MD;  Location: [facility];  Service: Cardiovascular;  Laterality: N/A;    Family History  Problem Relation Age of Onset  . Hypertension Mother   . Liver disease Mother   . Alcohol abuse Father     Social History:  reports that she has never smoked. She has never used smokeless tobacco. She reports that she does not drink  alcohol or use drugs.  Allergies: No Known Allergies  Medications: I have reviewed the patient's current medications.  Results for orders placed or performed during the hospital encounter of 01/26/18 (from the past 48 hour(s))  CBC     Status: Abnormal   Collection Time: 01/26/18 12:09 PM  Result Value Ref Range   WBC 11.8 (H) 4.0 - 10.5 K/uL   RBC 4.11 3.87 - 5.11 MIL/uL   Hemoglobin 11.9 (L) 12.0 - 15.0 g/dL   HCT 35.8 (L) 36.0 - 46.0 %   MCV 87.1 78.0 - 100.0 fL   MCH 29.0 26.0 - 34.0 pg   MCHC 33.2 30.0 - 36.0 g/dL   RDW 18.5 (H) 11.5 - 15.5 %   Platelets 197 150 - 400 K/uL    Comment: Performed at [facility], [address] [place], [place] [address]  Comprehensive metabolic panel     Status: Abnormal   Collection Time: 01/26/18 12:09 PM  Result Value Ref Range   Sodium 134 (L) 135 - 145 mmol/L   Potassium 2.8 (L) 3.5 - 5.1 mmol/L   Chloride 100 98 - 111 mmol/L   CO2 25 22 - 32 mmol/L   Glucose, Bld 116 (H) 70 - 99 mg/dL   BUN 18 8 - 23 mg/dL   Creatinine, Ser 1.13 (H) 0.44 - 1.00 mg/dL   Calcium 8.2 (L) 8.9 - 10.3 mg/dL   Total Protein 6.5 6.5 - 8.1 g/dL   Albumin 3.2 (  L) 3.5 - 5.0 g/dL   AST 17 15 - 41 U/L   ALT 10 0 - 44 U/L   Alkaline Phosphatase 58 38 - 126 U/L   Total Bilirubin 1.6 (H) 0.3 - 1.2 mg/dL   GFR calc non Af Amer 46 (L) >60 mL/min   GFR calc Af Amer 53 (L) >60 mL/min    Comment: (NOTE) The eGFR has been calculated using the CKD EPI equation. This calculation has not been validated in all clinical situations. eGFR's persistently <60 mL/min signify possible Chronic Kidney Disease.    Anion gap 9 5 - 15    Comment: Performed at [facility], [address], [place], [place] [address]  Troponin I     Status: Abnormal   Collection Time: 01/26/18 12:09 PM  Result Value Ref Range   Troponin I 0.31 (HH) <0.03 ng/mL    Comment: CRITICAL RESULT CALLED TO, READ BACK BY AND VERIFIED WITH:  WINNIGHAM C. @ [id number] ON [id number] BY HENDERSON L. Performed at  [facility], [address], [place], [place] [address]   Brain natriuretic peptide     Status: Abnormal   Collection Time: 01/26/18 12:09 PM  Result Value Ref Range   B Natriuretic Peptide 1,106.0 (H) 0.0 - 100.0 pg/mL    Comment: Performed at [facility], [address], [place], [place] [address]  Lactic acid, plasma     Status: None   Collection Time: 01/26/18  1:36 PM  Result Value Ref Range   Lactic Acid, Venous 0.9 0.5 - 1.9 mmol/L    Comment: Performed at [facility], [address] [place], [place] [address]  Magnesium     Status: None   Collection Time: 01/26/18  1:36 PM  Result Value Ref Range   Magnesium 1.9 1.7 - 2.4 mg/dL    Comment: Performed at [facility], [address], [place], [place] [address]  Troponin I     Status: Abnormal   Collection Time: 01/26/18 10:21 PM  Result Value Ref Range   Troponin I 0.46 (HH) <0.03 ng/mL    Comment: CRITICAL VALUE NOTED.  VALUE IS CONSISTENT WITH PREVIOUSLY REPORTED AND CALLED VALUE. Performed at [facility], [address] [place], [place] [address]   Troponin I     Status: Abnormal   Collection Time: 01/27/18  3:53 AM  Result Value Ref Range   Troponin I 0.40 (HH) <0.03 ng/mL    Comment: CRITICAL VALUE NOTED.  VALUE IS CONSISTENT WITH PREVIOUSLY REPORTED AND CALLED VALUE. Performed at [facility], [address], [place], [place] [address]   Comprehensive metabolic panel     Status: Abnormal   Collection Time: 01/27/18  3:54 AM  Result Value Ref Range   Sodium 137 135 - 145 mmol/L   Potassium 3.7 3.5 - 5.1 mmol/L    Comment: DELTA CHECK NOTED   Chloride 105 98 - 111 mmol/L   CO2 26 22 - 32 mmol/L   Glucose, Bld 90 70 - 99 mg/dL   BUN 20 8 - 23 mg/dL   Creatinine, Ser 0.90 0.44 - 1.00 mg/dL   Calcium 7.9 (L) 8.9 - 10.3 mg/dL   Total Protein 5.9 (L) 6.5 - 8.1 g/dL   Albumin 2.8 (L) 3.5 - 5.0 g/dL   AST 19 15 - 41 U/L   ALT 9 0 - 44 U/L   Alkaline Phosphatase 49 38 - 126 U/L   Total Bilirubin 0.6 0.3 - 1.2 mg/dL    GFR calc non Af Amer >  60 >60 mL/min   GFR calc Af Amer >60 >60 mL/min    Comment: (NOTE) The eGFR has been calculated using the CKD EPI equation. This calculation has not been validated in all clinical situations. eGFR's persistently <60 mL/min signify possible Chronic Kidney Disease.    Anion gap 6 5 - 15    Comment: Performed at [facility], [address] [place], [place] [address]  CBC     Status: Abnormal   Collection Time: 01/27/18  3:54 AM  Result Value Ref Range   WBC 7.0 4.0 - 10.5 K/uL   RBC 3.77 (L) 3.87 - 5.11 MIL/uL   Hemoglobin 10.7 (L) 12.0 - 15.0 g/dL   HCT 33.0 (L) 36.0 - 46.0 %   MCV 87.5 78.0 - 100.0 fL   MCH 28.4 26.0 - 34.0 pg   MCHC 32.4 30.0 - 36.0 g/dL   RDW 18.8 (H) 11.5 - 15.5 %   Platelets 189 150 - 400 K/uL    Comment: Performed at [facility], [address], [place], [place] [address]  Comprehensive metabolic panel     Status: Abnormal   Collection Time: 01/28/18  6:21 AM  Result Value Ref Range   Sodium 137 135 - 145 mmol/L   Potassium 3.3 (L) 3.5 - 5.1 mmol/L   Chloride 104 98 - 111 mmol/L   CO2 25 22 - 32 mmol/L   Glucose, Bld 94 70 - 99 mg/dL   BUN 16 8 - 23 mg/dL   Creatinine, Ser 0.78 0.44 - 1.00 mg/dL   Calcium 8.3 (L) 8.9 - 10.3 mg/dL   Total Protein 6.0 (L) 6.5 - 8.1 g/dL   Albumin 2.9 (L) 3.5 - 5.0 g/dL   AST 17 15 - 41 U/L   ALT 11 0 - 44 U/L   Alkaline Phosphatase 48 38 - 126 U/L   Total Bilirubin 0.6 0.3 - 1.2 mg/dL   GFR calc non Af Amer >60 >60 mL/min   GFR calc Af Amer >60 >60 mL/min    Comment: (NOTE) The eGFR has been calculated using the CKD EPI equation. This calculation has not been validated in all clinical situations. eGFR's persistently <60 mL/min signify possible Chronic Kidney Disease.    Anion gap 8 5 - 15    Comment: Performed at [facility], [address] [place],  [address]    Dg Chest Port 1 View  Result Date: 01/26/2018 CLINICAL DATA:  Shortness of breath, particularly when lying down.  EXAM: PORTABLE CHEST 1 VIEW COMPARISON:  PA and lateral chest 11/15/2017. FINDINGS: Heart size is upper normal and there is pulmonary vascular congestion. Aortic atherosclerosis is noted. No pneumothorax or pleural effusion. No focal bony abnormality. IMPRESSION: Pulmonary vascular congestion.  No focal abnormality. Electronically Signed   By: Inge Rise M.D.   On: 01/26/2018 12:26   US Abdomen Limited Ruq  Result Date: 01/26/2018 CLINICAL DATA:  RIGHT upper quadrant pain, history hypertension, coronary artery disease, gout, hypertrophic obstructive cardiomyopathy, rheumatoid arthritis EXAM: ULTRASOUND ABDOMEN LIMITED RIGHT UPPER QUADRANT COMPARISON:  None FINDINGS: Gallbladder: Multiple tiny layering gallstones within gallbladder. Small amount of sludge within gallbladder. Mild gallbladder wall thickening. Sonographic Murphy sign present. No definite pericholecystic fluid. Common bile duct: Diameter: 7 mm diameter, upper normal Liver: Suboptimally evaluated due to bowel gas and body habitus. Grossly normal echogenicity. No obvious mass identified on suboptimal assessment. Portal vein is patent on color Doppler imaging with normal direction of blood flow towards the liver. No RIGHT upper quadrant free  fluid. IMPRESSION: Cholelithiasis and sludge within gallbladder associated with gallbladder wall thickening and a sonographic Murphy sign suspicious for acute cholecystitis. Electronically Signed   By: Lavonia Dana M.D.   On: 01/26/2018 16:55    ROS:  Pertinent items are noted in HPI.  Blood pressure (!) 153/74, pulse 61, temperature 98.3 F (36.8 C), temperature source Oral, resp. rate 12, height 5' 2"  (1.575 m), weight 174 lb 14.4 oz (79.3 kg), SpO2 94 %. Physical Exam: Pleasant well-developed well-nourished Hispanic female no acute distress Head is normocephalic, atraumatic Eyes without scleral icterus Lungs clear to auscultation with equal breath sounds bilaterally Heart examination reveals a  regular rate and rhythm with a 4 out of 6 systolic ejection murmur along the left parasternal border Abdomen is soft with minimal tenderness to deep palpation along the right costal margin.  I could not palpate the liver edge.  No rigidity or hernias are noted. Ultrasound report reveals biliary sludge, normal common bile duct, no pericholecystic fluid. BMP noted to be significantly elevated Liver enzyme tests, white blood cell count within normal limits Assessment/Plan: Patient with multiple medical problems including hypertrophic cardiomyopathy who presented with right upper quadrant abdominal pain which has since resolved.  Her symptoms are atypical for biliary colic.  She does have biliary sludge, but no acute cholecystitis.  She may have right upper quadrant discomfort secondary to congestive heart failure affecting the liver.  She is at increased risk for surgical intervention given her cardiomyopathy.  I discussed this with at length with the patient and son.  They do agree that they do not want surgery unless absolutely necessary.  We will reevaluate cholecystectomy or placement of cholecystostomy tube in the future should she develop acute cholecystitis.  Discussed with Dr. Wynetta Emery.  Will sign off.  Aviva Signs 01/28/2018, 9:15 AM

## 2018-01-29 ENCOUNTER — Encounter (HOSPITAL_COMMUNITY): Payer: Self-pay | Admitting: Emergency Medicine

## 2018-01-29 ENCOUNTER — Other Ambulatory Visit: Payer: Self-pay

## 2018-01-29 DIAGNOSIS — I2510 Atherosclerotic heart disease of native coronary artery without angina pectoris: Secondary | ICD-10-CM | POA: Insufficient documentation

## 2018-01-29 DIAGNOSIS — K8020 Calculus of gallbladder without cholecystitis without obstruction: Secondary | ICD-10-CM | POA: Insufficient documentation

## 2018-01-29 DIAGNOSIS — I10 Essential (primary) hypertension: Secondary | ICD-10-CM | POA: Insufficient documentation

## 2018-01-29 DIAGNOSIS — I5032 Chronic diastolic (congestive) heart failure: Secondary | ICD-10-CM | POA: Insufficient documentation

## 2018-01-29 DIAGNOSIS — Z7982 Long term (current) use of aspirin: Secondary | ICD-10-CM | POA: Insufficient documentation

## 2018-01-29 DIAGNOSIS — Z79899 Other long term (current) drug therapy: Secondary | ICD-10-CM | POA: Insufficient documentation

## 2018-01-29 DIAGNOSIS — M069 Rheumatoid arthritis, unspecified: Secondary | ICD-10-CM

## 2018-01-29 DIAGNOSIS — I110 Hypertensive heart disease with heart failure: Secondary | ICD-10-CM | POA: Insufficient documentation

## 2018-01-29 DIAGNOSIS — Z7901 Long term (current) use of anticoagulants: Secondary | ICD-10-CM | POA: Insufficient documentation

## 2018-01-29 LAB — BASIC METABOLIC PANEL
ANION GAP: 7 (ref 5–15)
BUN: 16 mg/dL (ref 8–23)
CHLORIDE: 103 mmol/L (ref 98–111)
CO2: 27 mmol/L (ref 22–32)
Calcium: 8.6 mg/dL — ABNORMAL LOW (ref 8.9–10.3)
Creatinine, Ser: 0.77 mg/dL (ref 0.44–1.00)
GFR calc Af Amer: >60 mL/min (ref >60)
Glucose, Bld: 94 mg/dL (ref 70–99)
POTASSIUM: 3.8 mmol/L (ref 3.5–5.1)
Sodium: 137 mmol/L (ref 135–145)

## 2018-01-29 LAB — MAGNESIUM: Magnesium: 1.9 mg/dL (ref 1.7–2.4)

## 2018-01-29 MED ORDER — CEPHALEXIN 500 MG PO CAPS: 500.0000 mg | ORAL_CAPSULE | Freq: Two times a day (BID) | ORAL | 0 refills | Status: AC

## 2018-01-29 MED ORDER — FUROSEMIDE 40 MG PO TABS: 40.0000 mg | ORAL_TABLET | ORAL | Status: DC

## 2018-01-29 NOTE — Care Management Important Message (Signed)
Important Message  Patient Details  Name: Dana [name] MRN: [id number] Date of Birth: April 12, 1941   Medicare Important Message Given:  Yes    Renie Ora 01/29/2018, 11:58 AM

## 2018-01-29 NOTE — Discharge Summary (Signed)
Physician Discharge Summary  Dana [name] TYO:[id number] DOB: 1941/06/26 DOA: 01/26/2018  PCP: [name], MD  Cardiology: Diona Browner Surgery: Alvina Chou date: 01/26/2018 Discharge date: 01/29/2018  Admitted From: Home  Disposition: Home   Recommendations for Outpatient Follow-up:  1. Follow up with PCP in 1 weeks 2. Follow up with cardiology in 2 weeks 3. Follow up with surgery in 2 weeks  4. Please obtain BMP/CBC in one week 5. Please follow up on the following pending results: Final culture data  Discharge Condition: STABLE   CODE STATUS: Full   Brief Hospitalization Summary: Please see all hospital notes, images, labs for full details of the hospitalization.  Brief Narrative:  77 year old female with a history of hypertrophic cardiomyopathy, rheumatoid arthritis, hypertension, hyperlipidemia, presented to the hospital with right upper quadrant pain shortness of breath.  She is going to have mild exacerbation of CHF which was treated with intravenous Lasix.  Overall shortness of breath has now resolved.  She has been having right upper quadrant pain for the past 3 to 4 days.  Work-up indicated transient elevation of bilirubin with abdominal ultrasound indicating cholelithiasis and possible cholecystitis.  General surgery evaluated and not felt to be acute cholecystitis.   Assessment & Plan:   Active Problems:   Hypertrophic cardiomyopathy   HTN (hypertension)   Gout   CAD (coronary artery disease)   Acute on chronic diastolic CHF (congestive heart failure)   Rheumatoid arthritis   Elevated troponin   Acute lower UTI   Abdominal pain   GERD (gastroesophageal reflux disease)   RUQ pain  1. Acute on chronic diastolic congestive heart failure.  With a history of hypertrophic cardiomyopathy, would avoid over diuresing.  Cardiology input appreciated.  She has been restarted on her home oral lasix dose.  She should follow up with cardiology in 2 weeks.  2. Abdominal pain possibly  related to biliary colic.  General surgery evaluated her and there is no need for surgical intervention at this time.  She would be evaluated for a possible cholecystostomy in future if she continued to have problems with biliary colic.  Follow up with Dr. Lovell Sheehan in 2 weeks recommended.  Low fat diet recommended.   3. Urinary tract infection.  Urine culture positive for E. coli.  She is on appropriate antibiotics. Discharge home on 3 more days of oral cephalexin.   4. Rheumatoid arthritis.  She is currently on methotrexate.  No evidence of flare. 5. Elevated troponin.  Seen by cardiology, felt to be demand ischemia in the setting of #1 #2.  No acute EKG changes.  No chest pain.  No further work-up planned at this time. 6. GERD.  Continue on PPI and H2 blockers 7. Hyperlipidemia.  Continue on statin 8. Hypertension.  Was low on admission, so home dose of Coreg and verapamil have been held.  She is now normotensive. 9. Hypokalemia. Oral replacement ordered.   DVT prophylaxis: lovenox Code Status: full code Family Communication: discussed with son at the bedside Disposition Plan: discharge home once improved, possibly tomorrow if remains stable  Consultants:   General surgery  Procedures:   N/A  Antimicrobials:   Ceftriaxone 8/2> 8/5  Discharge Diagnoses:  Active Problems:   Hypertrophic cardiomyopathy (HCC)   HTN (hypertension)   Gout   CAD (coronary artery disease)   Acute on chronic diastolic CHF (congestive heart failure) (HCC)   Rheumatoid arthritis (HCC)   Elevated troponin   Acute lower UTI   Abdominal pain   GERD (gastroesophageal  reflux disease)   RUQ pain  Discharge Instructions: Discharge Instructions    (HEART FAILURE PATIENTS) Call MD:  Anytime you have any of the following symptoms: 1) 3 pound weight gain in 24 hours or 5 pounds in 1 week 2) shortness of breath, with or without a dry hacking cough 3) swelling in the hands, feet or stomach 4) if you have to  sleep on extra pillows at night in order to breathe.   Complete by:  As directed    Call MD for:  difficulty breathing, headache or visual disturbances   Complete by:  As directed    Call MD for:  extreme fatigue   Complete by:  As directed    Call MD for:  hives   Complete by:  As directed    Call MD for:  persistant dizziness or light-headedness   Complete by:  As directed    Call MD for:  persistant nausea and vomiting   Complete by:  As directed    Call MD for:  severe uncontrolled pain   Complete by:  As directed    Diet - low sodium heart healthy   Complete by:  As directed    Increase activity slowly   Complete by:  As directed      Allergies as of 01/29/2018   No Known Allergies     Medication List    TAKE these medications   allopurinol 300 MG tablet Commonly known as:  ZYLOPRIM Take 300 mg by mouth daily as needed (for gout).   aspirin EC 81 MG tablet Take 81 mg by mouth daily.   atorvastatin 10 MG tablet Commonly known as:  LIPITOR Take 10 mg by mouth daily.   carvedilol 6.25 MG tablet Commonly known as:  COREG Take 1 tablet (6.25 mg total) by mouth 2 (two) times daily with a meal.   cephALEXin 500 MG capsule Commonly known as:  KEFLEX Take 1 capsule (500 mg total) by mouth 2 (two) times daily for 3 days.   clopidogrel 75 MG tablet Commonly known as:  PLAVIX TAKE 1 TABLET BY MOUTH ONCE DAILY.   ezetimibe 10 MG tablet Commonly known as:  ZETIA Take 10 mg by mouth daily.   famotidine 20 MG tablet Commonly known as:  PEPCID Take 1 tablet (20 mg total) by mouth 2 (two) times daily.   FEROSUL 325 (65 FE) MG tablet Generic drug:  ferrous sulfate Take 325 mg by mouth daily with breakfast.   fluticasone 50 MCG/ACT nasal spray Commonly known as:  FLONASE Place 2 sprays into both nostrils daily.   folic acid 1 MG tablet Commonly known as:  FOLVITE Take 1 mg by mouth daily.   furosemide 40 MG tablet Commonly known as:  LASIX Take 1 tablet (40 mg  total) by mouth every other day. What changed:    when to take this  reasons to take this   methotrexate 2.5 MG tablet Take 15 mg by mouth once a week. On Thursday   nitroGLYCERIN 0.4 MG SL tablet Commonly known as:  NITROSTAT Place 1 tablet (0.4 mg total) under the tongue every 5 (five) minutes as needed for chest pain.   pantoprazole 40 MG tablet Commonly known as:  PROTONIX Take 40 mg by mouth daily.   RESTASIS MULTIDOSE 0.05 % ophthalmic emulsion Generic drug:  cycloSPORINE PLACE ONE DROP IN BOTH EYES TWICE DAILY   sucralfate 1 g tablet Commonly known as:  CARAFATE Take 1 tablet (1 g total) by  mouth 4 (four) times daily -  with meals and at bedtime.   verapamil 240 MG CR tablet Commonly known as:  CALAN-SR Take 1 tablet (240 mg total) by mouth daily.      Follow-up Information    [name], MD. Schedule an appointment as soon as possible for a visit in 1 week(s).   Specialty:  Internal Medicine Contact information: [address] [place] [address] [phone]        Jonelle Sidle, MD. Schedule an appointment as soon as possible for a visit in 2 week(s).   Specialty:  Cardiology Why:  Hospital Follow Up  Contact information: [address] [place] [address] [phone]        [name], MD. Schedule an appointment as soon as possible for a visit in 2 week(s).   Specialty:  General Surgery Why:  Hospital Follow Up Gallbladder Contact information: [address] [place] [address] [phone]          No Known Allergies Allergies as of 01/29/2018   No Known Allergies     Medication List    TAKE these medications   allopurinol 300 MG tablet Commonly known as:  ZYLOPRIM Take 300 mg by mouth daily as needed (for gout).   aspirin EC 81 MG tablet Take 81 mg by mouth daily.   atorvastatin 10 MG tablet Commonly known as:  LIPITOR Take 10 mg by mouth daily.   carvedilol 6.25 MG tablet Commonly  known as:  COREG Take 1 tablet (6.25 mg total) by mouth 2 (two) times daily with a meal.   cephALEXin 500 MG capsule Commonly known as:  KEFLEX Take 1 capsule (500 mg total) by mouth 2 (two) times daily for 3 days.   clopidogrel 75 MG tablet Commonly known as:  PLAVIX TAKE 1 TABLET BY MOUTH ONCE DAILY.   ezetimibe 10 MG tablet Commonly known as:  ZETIA Take 10 mg by mouth daily.   famotidine 20 MG tablet Commonly known as:  PEPCID Take 1 tablet (20 mg total) by mouth 2 (two) times daily.   FEROSUL 325 (65 FE) MG tablet Generic drug:  ferrous sulfate Take 325 mg by mouth daily with breakfast.   fluticasone 50 MCG/ACT nasal spray Commonly known as:  FLONASE Place 2 sprays into both nostrils daily.   folic acid 1 MG tablet Commonly known as:  FOLVITE Take 1 mg by mouth daily.   furosemide 40 MG tablet Commonly known as:  LASIX Take 1 tablet (40 mg total) by mouth every other day. What changed:    when to take this  reasons to take this   methotrexate 2.5 MG tablet Take 15 mg by mouth once a week. On Thursday   nitroGLYCERIN 0.4 MG SL tablet Commonly known as:  NITROSTAT Place 1 tablet (0.4 mg total) under the tongue every 5 (five) minutes as needed for chest pain.   pantoprazole 40 MG tablet Commonly known as:  PROTONIX Take 40 mg by mouth daily.   RESTASIS MULTIDOSE 0.05 % ophthalmic emulsion Generic drug:  cycloSPORINE PLACE ONE DROP IN BOTH EYES TWICE DAILY   sucralfate 1 g tablet Commonly known as:  CARAFATE Take 1 tablet (1 g total) by mouth 4 (four) times daily -  with meals and at bedtime.   verapamil 240 MG CR tablet Commonly known as:  CALAN-SR Take 1 tablet (240 mg total) by mouth daily.       Procedures/Studies: Dg Chest Port 1  View  Result Date: 01/26/2018 CLINICAL DATA:  Shortness of breath, particularly when lying down. EXAM: PORTABLE CHEST 1 VIEW COMPARISON:  PA and lateral chest 11/15/2017. FINDINGS: Heart size is upper normal and there  is pulmonary vascular congestion. Aortic atherosclerosis is noted. No pneumothorax or pleural effusion. No focal bony abnormality. IMPRESSION: Pulmonary vascular congestion.  No focal abnormality. Electronically Signed   By: Drusilla Kanner M.D.   On: 01/26/2018 12:26   US Abdomen Limited Ruq  Result Date: 01/26/2018 CLINICAL DATA:  RIGHT upper quadrant pain, history hypertension, coronary artery disease, gout, hypertrophic obstructive cardiomyopathy, rheumatoid arthritis EXAM: ULTRASOUND ABDOMEN LIMITED RIGHT UPPER QUADRANT COMPARISON:  None FINDINGS: Gallbladder: Multiple tiny layering gallstones within gallbladder. Small amount of sludge within gallbladder. Mild gallbladder wall thickening. Sonographic Murphy sign present. No definite pericholecystic fluid. Common bile duct: Diameter: 7 mm diameter, upper normal Liver: Suboptimally evaluated due to bowel gas and body habitus. Grossly normal echogenicity. No obvious mass identified on suboptimal assessment. Portal vein is patent on color Doppler imaging with normal direction of blood flow towards the liver. No RIGHT upper quadrant free fluid. IMPRESSION: Cholelithiasis and sludge within gallbladder associated with gallbladder wall thickening and a sonographic Murphy sign suspicious for acute cholecystitis. Electronically Signed   By: Ulyses Southward M.D.   On: 01/26/2018 16:55      Subjective: Pt feeling much better, she is tolerating diet, she has no fever, she is ambulating.  No abdominal pain with eating.    Discharge Exam: Vitals:   01/28/18 2300 01/29/18 0634  BP: 135/75 129/86  Pulse: 68 64  Resp:  16  Temp: 98 F (36.7 C) 98.3 F (36.8 C)  SpO2: 100% 97%   Vitals:   01/28/18 0603 01/28/18 1439 01/28/18 2300 01/29/18 0634  BP: (!) 153/74 (!) 143/88 135/75 129/86  Pulse: 61 65 68 64  Resp: 12 18  16   Temp: 98.3 F (36.8 C) 98.6 F (37 C) 98 F (36.7 C) 98.3 F (36.8 C)  TempSrc: Oral Oral Oral Oral  SpO2: 94% 93% 100% 97%   Weight:      Height:       General: Pt is alert, awake, not in acute distress Cardiovascular: RRR, S1/S2 +, no rubs, no gallops Respiratory: CTA bilaterally, no wheezing, no rhonchi Abdominal: Soft, NT, ND, bowel sounds + Extremities: no edema, no cyanosis   The results of significant diagnostics from this hospitalization (including imaging, microbiology, ancillary and laboratory) are listed below for reference.     Microbiology: Recent Results (from the past 240 hour(s))  Urine culture     Status: Abnormal   Collection Time: 01/24/18 12:01 PM  Result Value Ref Range Status   Specimen Description   Final    URINE, RANDOM Performed at [facility], [address] [place], [place] [address]    Special Requests   Final    NONE Performed at [facility], [address], [place], [place] [address]    Culture >=100,000 COLONIES/mL ESCHERICHIA COLI (A)  Final   Report Status 01/27/2018 FINAL  Final   Organism ID, Bacteria ESCHERICHIA COLI (A)  Final      Susceptibility   Escherichia coli - MIC*    AMPICILLIN >=32 RESISTANT Resistant     CEFAZOLIN <=4 SENSITIVE Sensitive     CEFTRIAXONE <=1 SENSITIVE Sensitive     CIPROFLOXACIN <=0.25 SENSITIVE Sensitive     GENTAMICIN <=1 SENSITIVE Sensitive     IMIPENEM 0.5 SENSITIVE Sensitive     NITROFURANTOIN 32 SENSITIVE  Sensitive     TRIMETH/SULFA <=20 SENSITIVE Sensitive     AMPICILLIN/SULBACTAM >=32 RESISTANT Resistant     PIP/TAZO <=4 SENSITIVE Sensitive     Extended ESBL NEGATIVE Sensitive     * >=100,000 COLONIES/mL ESCHERICHIA COLI     Labs: BNP (last 3 results) Recent Labs    01/29/17 1305 09/16/17 2236 01/26/18 1209  BNP 372.0* 251.5* 1,106.0*   Basic Metabolic Panel: Recent Labs  Lab 01/24/18 1201 01/26/18 1209 01/26/18 1336 01/27/18 0354 01/28/18 0621 01/29/18 0505  NA 139 134*  --  137 137 137  K 3.5 2.8*  --  3.7 3.3* 3.8  CL 104 100  --  105 104 103  CO2 28 25  --  26 25 27   GLUCOSE 124* 116*  --  90 94  94  BUN 14 18  --  20 16 16   CREATININE 0.69 1.13*  --  0.90 0.78 0.77  CALCIUM 8.7* 8.2*  --  7.9* 8.3* 8.6*  MG  --   --  1.9  --  1.9 1.9   Liver Function Tests: Recent Labs  Lab 01/24/18 1201 01/26/18 1209 01/27/18 0354 01/28/18 0621  AST 19 17 19 17   ALT 9 10 9 11   ALKPHOS 78 58 49 48  BILITOT 0.7 1.6* 0.6 0.6  PROT 7.4 6.5 5.9* 6.0*  ALBUMIN 3.9 3.2* 2.8* 2.9*   Recent Labs  Lab 01/24/18 1201  LIPASE 36   No results for input(s): AMMONIA in the last 168 hours. CBC: Recent Labs  Lab 01/24/18 1201 01/26/18 1209 01/27/18 0354  WBC 8.3 11.8* 7.0  NEUTROABS 6.7  --   --   HGB 12.2 11.9* 10.7*  HCT 37.2 35.8* 33.0*  MCV 88.2 87.1 87.5  PLT 240 197 189   Cardiac Enzymes: Recent Labs  Lab 01/26/18 1209 01/26/18 2221 01/27/18 0353  TROPONINI 0.31* 0.46* 0.40*   BNP: Invalid input(s): POCBNP CBG: No results for input(s): GLUCAP in the last 168 hours. D-Dimer No results for input(s): DDIMER in the last 72 hours. Hgb A1c No results for input(s): HGBA1C in the last 72 hours. Lipid Profile No results for input(s): CHOL, HDL, LDLCALC, TRIG, CHOLHDL, LDLDIRECT in the last 72 hours. Thyroid function studies No results for input(s): TSH, T4TOTAL, T3FREE, THYROIDAB in the last 72 hours.  Invalid input(s): FREET3 Anemia work up No results for input(s): VITAMINB12, FOLATE, FERRITIN, TIBC, IRON, RETICCTPCT in the last 72 hours. Urinalysis    Component Value Date/Time   COLORURINE YELLOW 01/24/2018 1201   APPEARANCEUR CLOUDY (A) 01/24/2018 1201   LABSPEC 1.014 01/24/2018 1201   PHURINE 8.0 01/24/2018 1201   GLUCOSEU NEGATIVE 01/24/2018 1201   HGBUR NEGATIVE 01/24/2018 1201   BILIRUBINUR NEGATIVE 01/24/2018 1201   KETONESUR NEGATIVE 01/24/2018 1201   PROTEINUR NEGATIVE 01/24/2018 1201   NITRITE POSITIVE (A) 01/24/2018 1201   LEUKOCYTESUR NEGATIVE 01/24/2018 1201   Sepsis Labs Invalid input(s): PROCALCITONIN,  WBC,  LACTICIDVEN Microbiology Recent Results  (from the past 240 hour(s))  Urine culture     Status: Abnormal   Collection Time: 01/24/18 12:01 PM  Result Value Ref Range Status   Specimen Description   Final    URINE, RANDOM Performed at [facility], [address], [place], 01/26/18 [facility]    Special Requests   Final    NONE Performed at [facility], [address] [place], [address] [facility]    Culture >=100,000 COLONIES/mL ESCHERICHIA COLI (A)  Final   Report Status 01/27/2018 FINAL  Final  Organism ID, Bacteria ESCHERICHIA COLI (A)  Final      Susceptibility   Escherichia coli - MIC*    AMPICILLIN >=32 RESISTANT Resistant     CEFAZOLIN <=4 SENSITIVE Sensitive     CEFTRIAXONE <=1 SENSITIVE Sensitive     CIPROFLOXACIN <=0.25 SENSITIVE Sensitive     GENTAMICIN <=1 SENSITIVE Sensitive     IMIPENEM 0.5 SENSITIVE Sensitive     NITROFURANTOIN 32 SENSITIVE Sensitive     TRIMETH/SULFA <=20 SENSITIVE Sensitive     AMPICILLIN/SULBACTAM >=32 RESISTANT Resistant     PIP/TAZO <=4 SENSITIVE Sensitive     Extended ESBL NEGATIVE Sensitive     * >=100,000 COLONIES/mL ESCHERICHIA COLI   Time coordinating discharge: 32 mins  SIGNED:  Standley Dakins, MD  [facility] 01/29/2018, 9:32 AM Pager [phone]  If 7PM-7AM, please contact night-coverage [email or web address] Password [email or web address]

## 2018-01-29 NOTE — Progress Notes (Signed)
Patient and family state understanding of discharge instructions 

## 2018-01-29 NOTE — ED Triage Notes (Signed)
Pt c/o sharp shooting abd pain and states she has sob. Pt is in no apparent distress at this time. Pt was released from upstairs earlier today.

## 2018-01-29 NOTE — Discharge Instructions (Signed)
Please seek medical care or return to ER if symptoms return, get worse or new problem develops.     Low Fat Diet Recommended  Abdominal Pain, Adult Abdominal pain can be caused by many things. Often, abdominal pain is not serious and it gets better with no treatment or by being treated at home. However, sometimes abdominal pain is serious. Your health care provider will do a medical history and a physical exam to try to determine the cause of your abdominal pain. Follow these instructions at home:  Take over-the-counter and prescription medicines only as told by your health care provider. Do not take a laxative unless told by your health care provider.  Drink enough fluid to keep your urine clear or pale yellow.  Watch your condition for any changes.  Keep all follow-up visits as told by your health care provider. This is important. Contact a health care provider if:  Your abdominal pain changes or gets worse.  You are not hungry or you lose weight without trying.  You are constipated or have diarrhea for more than 2-3 days.  You have pain when you urinate or have a bowel movement.  Your abdominal pain wakes you up at night.  Your pain gets worse with meals, after eating, or with certain foods.  You are throwing up and cannot keep anything down.  You have a fever. Get help right away if:  Your pain does not go away as soon as your health care provider told you to expect.  You cannot stop throwing up.  Your pain is only in areas of the abdomen, such as the right side or the left lower portion of the abdomen.  You have bloody or black stools, or stools that look like tar.  You have severe pain, cramping, or bloating in your abdomen.  You have signs of dehydration, such as: ? Dark urine, very little urine, or no urine. ? Cracked lips. ? Dry mouth. ? Sunken eyes. ? Sleepiness. ? Weakness. This information is not intended to replace advice given to you by your health  care provider. Make sure you discuss any questions you have with your health care provider. Document Released: 03/23/2005 Document Revised: 01/01/2016 Document Reviewed: 11/25/2015 Elsevier Interactive Patient Education  2018 [employer].  Cholelithiasis Cholelithiasis is also called "gallstones." It is a kind of gallbladder disease. The gallbladder is an organ that stores a liquid (bile) that helps you digest fat. Gallstones may not cause symptoms (may be silent gallstones) until they cause a blockage, and then they can cause pain (gallbladder attack). Follow these instructions at home:  Take over-the-counter and prescription medicines only as told by your doctor.  Stay at a healthy weight.  Eat healthy foods. This includes: ? Eating fewer fatty foods, like fried foods. ? Eating fewer refined carbs (refined carbohydrates). Refined carbs are breads and grains that are highly processed, like white bread and white rice. Instead, choose whole grains like whole-wheat bread and brown rice. ? Eating more fiber. Almonds, fresh fruit, and beans are healthy sources of fiber.  Keep all follow-up visits as told by your doctor. This is important. Contact a doctor if:  You have sudden pain in the upper right side of your belly (abdomen). Pain might spread to your right shoulder or your chest. This may be a sign of a gallbladder attack.  You feel sick to your stomach (are nauseous).  You throw up (vomit).  You have been diagnosed with gallstones that have no symptoms and  you get: ? Belly pain. ? Discomfort, burning, or fullness in the upper part of your belly (indigestion). Get help right away if:  You have sudden pain in the upper right side of your belly, and it lasts for more than 2 hours.  You have belly pain that lasts for more than 5 hours.  You have a fever or chills.  You keep feeling sick to your stomach or you keep throwing up.  Your skin or the whites of your eyes turn yellow  (jaundice).  You have dark-colored pee (urine).  You have light-colored poop (stool). Summary  Cholelithiasis is also called "gallstones."  The gallbladder is an organ that stores a liquid (bile) that helps you digest fat.  Silent gallstones are gallstones that do not cause symptoms.  A gallbladder attack may cause sudden pain in the upper right side of your belly. Pain might spread to your right shoulder or your chest. If this happens, contact your doctor.  If you have sudden pain in the upper right side of your belly that lasts for more than 2 hours, get help right away. This information is not intended to replace advice given to you by your health care provider. Make sure you discuss any questions you have with your health care provider. Document Released: 11/30/2007 Document Revised: 02/28/2016 Document Reviewed: 02/28/2016 Elsevier Interactive Patient Education  2017 [employer]    Heart Failure Action Plan A heart failure action plan helps you understand what to do when you have symptoms of heart failure. Follow the plan that was created by you and your health care provider. Review your plan each time you visit your health care provider. Red zone These signs and symptoms mean you should get medical help right away:  You have trouble breathing when resting.  You have a dry cough that is getting worse.  You have swelling or pain in your legs or abdomen that is getting worse.  You suddenly gain more than 2-3 lb (0.9-1.4 kg) in a day, or more than 5 lb (2.3 kg) in one week. This amount may be more or less depending on your condition.  You have trouble staying awake or you feel confused.  You have chest pain.  You do not have an appetite.  You pass out.  If you experience any of these symptoms:  Call your local emergency services (911 in the [place] right away or seek help at the emergency department of the nearest hospital.  Yellow zone These signs and symptoms mean  your condition may be getting worse and you should make some changes:  You have trouble breathing when you are active or you need to sleep with extra pillows.  You have swelling in your legs or abdomen.  You gain 2-3 lb (0.9-1.4 kg) in one day, or 5 lb (2.3 kg) in one week. This amount may be more or less depending on your condition.  You get tired easily.  You have trouble sleeping.  You have a dry cough.  If you experience any of these symptoms:  Contact your health care provider within the next day.  Your health care provider may adjust your medicines.  Green zone These signs mean you are doing well and can continue what you are doing:  You do not have shortness of breath.  You have very little swelling or no new swelling.  Your weight is stable (no gain or loss).  You have a normal activity level.  You do not have chest pain  or any other new symptoms.  Follow these instructions at home:  Take over-the-counter and prescription medicines only as told by your health care provider.  Weigh yourself daily. Your target weight is __________ lb (__________ kg). ? Call your health care provider if you gain more than __________ lb (__________ kg) in a day, or more than __________ lb (__________ kg) in one week.  Eat a heart-healthy diet. Work with a diet and nutrition specialist (dietitian) to create an eating plan that is best for you.  Keep all follow-up visits as told by your health care provider. This is important. Where to find more information:  American Heart Association: www.heart.org Summary  Follow the action plan that was created by you and your health care provider.  Get help right away if you have any symptoms in the Red zone. This information is not intended to replace advice given to you by your health care provider. Make sure you discuss any questions you have with your health care provider. Document Released: 07/23/2016 Document Revised: 07/23/2016  Document Reviewed: 07/23/2016 Elsevier Interactive Patient Education  2018 [employer] Inc[employer]  Heart Failure Heart failure means your heart has trouble pumping blood. This makes it hard for your body to work well. Heart failure is usually a long-term (chronic) condition. You must take good care of yourself and follow your doctor's treatment plan. Follow these instructions at home:  Take your heart medicine as told by your doctor. ? Do not stop taking medicine unless your doctor tells you to. ? Do not skip any dose of medicine. ? Refill your medicines before they run out. ? Take other medicines only as told by your doctor or pharmacist.  Stay active if told by your doctor. The elderly and people with severe heart failure should talk with a doctor about physical activity.  Eat heart-healthy foods. Choose foods that are without trans fat and are low in saturated fat, cholesterol, and salt (sodium). This includes fresh or frozen fruits and vegetables, fish, lean meats, fat-free or low-fat dairy foods, whole grains, and high-fiber foods. Lentils and dried peas and beans (legumes) are also good choices.  Limit salt if told by your doctor.  Cook in a healthy way. Roast, grill, broil, bake, poach, steam, or stir-fry foods.  Limit fluids as told by your doctor.  Weigh yourself every morning. Do this after you pee (urinate) and before you eat breakfast. Write down your weight to give to your doctor.  Take your blood pressure and write it down if your doctor tells you to.  Ask your doctor how to check your pulse. Check your pulse as told.  Lose weight if told by your doctor.  Stop smoking or chewing tobacco. Do not use gum or patches that help you quit without your doctor's approval.  Schedule and go to doctor visits as told.  Nonpregnant women should have no more than 1 drink a day. Men should have no more than 2 drinks a day. Talk to your doctor about drinking alcohol.  Stop illegal drug  use.  Stay current with shots (immunizations).  Manage your health conditions as told by your doctor.  Learn to manage your stress.  Rest when you are tired.  If it is really hot outside: ? Avoid intense activities. ? Use air conditioning or fans, or get in a cooler place. ? Avoid caffeine and alcohol. ? Wear loose-fitting, lightweight, and light-colored clothing.  If it is really cold outside: ? Avoid intense activities. ? Layer your clothing. ?  Wear mittens or gloves, a hat, and a scarf when going outside. ? Avoid alcohol.  Learn about heart failure and get support as needed.  Get help to maintain or improve your quality of life and your ability to care for yourself as needed. Contact a doctor if:  You gain weight quickly.  You are more short of breath than usual.  You cannot do your normal activities.  You tire easily.  You cough more than normal, especially with activity.  You have any or more puffiness (swelling) in areas such as your hands, feet, ankles, or belly (abdomen).  You cannot sleep because it is hard to breathe.  You feel like your heart is beating fast (palpitations).  You get dizzy or light-headed when you stand up. Get help right away if:  You have trouble breathing.  There is a change in mental status, such as becoming less alert or not being able to focus.  You have chest pain or discomfort.  You faint. This information is not intended to replace advice given to you by your health care provider. Make sure you discuss any questions you have with your health care provider. Document Released: 03/22/2008 Document Revised: 11/19/2015 Document Reviewed: 07/30/2012 Elsevier Interactive Patient Education  2017 [employer] Inc[employer]    Heart Failure Eating Plan Heart failure, also called congestive heart failure, occurs when your heart does not pump blood well enough to meet your body's needs for oxygen-rich blood. Heart failure is a long-term  (chronic) condition. Living with heart failure can be challenging. However, following your health care provider's instructions about a healthy lifestyle and working with a diet and nutrition specialist (dietitian) to choose the right foods may help to improve your symptoms. What are tips for following this plan? General guidelines  Do not eat more than 2,300 mg of salt (sodium) a day. The amount of sodium that is recommended for you may be lower, depending on your condition.  Maintain a healthy body weight as directed. Ask your health care provider what a healthy weight is for you. ? Check your weight every day. ? Work with your health care provider and dietitian to make a plan that is right for you to lose weight or maintain your current weight.  Limit how much fluid you drink. Ask your health care provider or dietitian how much fluid you can have each day.  Limit or avoid alcohol as told by your health care provider or dietitian. Reading food labels  Check food labels for the amount of sodium per serving. Choose foods that have less than 140 mg (milligrams) of sodium in each serving.  Check food labels for the number of calories per serving. This is important if you need to limit your daily calorie intake to lose weight.  Check food labels for the serving size. If you eat more than one serving, you will be eating more sodium and calories than what is listed on the label.  Look for foods that are labeled as "sodium-free," "very low sodium," or "low sodium." ? Foods labeled as "reduced sodium" or "lightly salted" may still have more sodium than what is recommended for you. Cooking  Avoid adding salt when cooking. Ask your health care provider or dietitian before using salt substitutes.  Season food with salt-free seasonings, spices, or herbs. Check the label of seasoning mixes to make sure they do not contain salt.  Cook with heart-healthy oils, such as olive, canola, soybean, or  sunflower oil.  Do not fry foods.  Cook foods using low-fat methods, such as baking, boiling, grilling, and broiling.  Limit unhealthy fats when cooking by: ? Removing the skin from poultry, such as chicken. ? Removing all visible fats from meats. ? Skimming the fat off from stews, soups, and gravies before serving them. Meal planning  Limit your intake of: ? Processed, canned, or pre-packaged foods. ? Foods that are high in trans fat, such as fried foods. ? Sweets, desserts, sugary drinks, and other foods with added sugar. ? Full-fat dairy products, such as whole milk.  Eat a balanced diet that includes: ? 4-5 servings of fruit each day and 4-5 servings of vegetables each day. At each meal, try to fill half of your plate with fruits and vegetables. ? Up to 6-8 servings of whole grains each day. ? Up to 2 servings of lean meat, poultry, or fish each day. One serving of meat is equal to 3 oz. This is about the same size as a deck of cards. ? 2 servings of low-fat dairy each day. ? Heart-healthy fats. Healthy fats called omega-3 fatty acids are found in foods such as flaxseed and cold-water fish like sardines, salmon, and mackerel.  Aim to eat 25-35 g (grams) of fiber a day. Foods that are high in fiber include apples, broccoli, carrots, beans, peas, and whole grains.  Do not add salt or condiments that contain salt (such as soy sauce) to foods before eating.  When eating at a restaurant, ask that your food be prepared with less salt or no salt, if possible.  Try to eat 2 or more vegetarian meals each week.  Eat more home-cooked food and eat less restaurant, buffet, and fast food. Recommended foods The items listed may not be a complete list. Talk with your dietitian about what dietary choices are best for you. Grains Bread with less than 80 mg of sodium per slice. Whole-wheat pasta, quinoa, and brown rice. Oats and oatmeal. Barley. Millet. Grits and cream of wheat. Whole-grain and  whole-wheat cold cereal. Vegetables All fresh vegetables. Vegetables that are frozen without sauce or added salt. Low-sodium or sodium-free canned vegetables. Fruits All fresh, frozen, and canned fruits. Dried fruits, such as raisins, prunes, and cranberries. Meats and other protein foods Lean cuts of meat. Skinless chicken and [place]. Fish with high omega-3 fatty acids, such as salmon, sardines, and other cold-water fishes. Eggs. Dried beans, peas, and edamame. Unsalted nuts and nut butters. Dairy Low-fat or nonfat (skim) milk and dried milk. Rice milk, soy milk, and almond milk. Low-fat or nonfat yogurt. Small amounts of reduced-sodium block cheese. Low-sodium cottage cheese. Fats and oils Olive, canola, soybean, flaxseed, or sunflower oil. Avocado. Sweets and desserts Apple sauce. Granola bars. Sugar-free pudding and gelatin. Frozen fruit bars. Seasoning and other foods Fresh and dried herbs. Lemon or lime juice. Vinegar. Low-sodium ketchup. Salt-free marinades, salad dressings, sauces, and seasonings. Foods to avoid The items listed may not be a complete list. Talk with your dietitian about what dietary choices are best for you. Grains Bread with more than 80 mg of sodium per slice. Hot or cold cereal with more than 140 mg sodium per serving. Salted pretzels and crackers. Pre-packaged breadcrumbs. Bagels, croissants, and biscuits. Vegetables Canned vegetables. Frozen vegetables with sauce or seasonings. Creamed vegetables. [place] fries. Onion rings. Pickled vegetables and sauerkraut. Fruits Fruits that are dried with sodium-containing preservatives. Meats and other protein foods Ribs and chicken wings. Bacon, ham, pepperoni, bologna, salami, and packaged luncheon meats. Hot dogs, bratwurst, and sausage.  Canned meat. Smoked meat and fish. Salted nuts and seeds. Dairy Whole milk, half-and-half, and cream. Buttermilk. Processed cheese, cheese spreads, and cheese curds. Regular cottage  cheese. Feta cheese. Shredded cheese. String cheese. Fats and oils Butter, lard, shortening, ghee, and bacon fat. Canned and packaged gravies. Seasoning and other foods Onion salt, garlic salt, table salt, and sea salt. Marinades. Regular salad dressings. Relishes, pickles, and olives. Meat flavorings and tenderizers, and bouillon cubes. Horseradish, ketchup, and mustard. Worcestershire sauce. Teriyaki sauce, soy sauce (including reduced sodium). Hot sauce and Tabasco sauce. Steak sauce, fish sauce, oyster sauce, and cocktail sauce. Taco seasonings. Barbecue sauce. Tartar sauce. Summary  A heart failure eating plan includes changes that limit your intake of sodium and unhealthy fat, and it may help you lose weight or maintain a healthy weight. Your health care provider may also recommend limiting how much fluid you drink.  Most people with heart failure should eat no more than 2,300 mg of salt (sodium) a day. The amount of sodium that is recommended for you may be lower, depending on your condition.  Contact your health care provider or dietitian before making any major changes to your diet. This information is not intended to replace advice given to you by your health care provider. Make sure you discuss any questions you have with your health care provider. Document Released: 10/28/2016 Document Revised: 10/28/2016 Document Reviewed: 10/28/2016 Elsevier Interactive Patient Education  2018 [employer].    Fat and Cholesterol Restricted Diet Getting too much fat and cholesterol in your diet may cause health problems. Following this diet helps keep your fat and cholesterol at normal levels. This can keep you from getting sick. What types of fat should I choose?  Choose monosaturated and polyunsaturated fats. These are found in foods such as olive oil, canola oil, flaxseeds, walnuts, almonds, and seeds.  Eat more omega-3 fats. Good choices include salmon, mackerel, sardines, tuna, flaxseed  oil, and ground flaxseeds.  Limit saturated fats. These are in animal products such as meats, butter, and cream. They can also be in plant products such as palm oil, palm kernel oil, and coconut oil.  Avoid foods with partially hydrogenated oils in them. These contain trans fats. Examples of foods that have trans fats are stick margarine, some tub margarines, cookies, crackers, and other baked goods. What general guidelines do I need to follow?  Check food labels. Look for the words "trans fat" and "saturated fat."  When preparing a meal: ? Fill half of your plate with vegetables and green salads. ? Fill one fourth of your plate with whole grains. Look for the word "whole" as the first word in the ingredient list. ? Fill one fourth of your plate with lean protein foods.  Eat more foods that have fiber, like apples, carrots, beans, peas, and barley.  Eat more home-cooked foods. Eat less at restaurants and buffets.  Limit or avoid alcohol.  Limit foods high in starch and sugar.  Limit fried foods.  Cook foods without frying them. Baking, boiling, grilling, and broiling are all great options.  Lose weight if you are overweight. Losing even a small amount of weight can help your overall health. It can also help prevent diseases such as diabetes and heart disease. What foods can I eat? Grains Whole grains, such as whole wheat or whole grain breads, crackers, cereals, and pasta. Unsweetened oatmeal, bulgur, barley, quinoa, or brown rice. Corn or whole wheat flour tortillas. Vegetables Fresh or frozen vegetables (raw, steamed, roasted,  or grilled). Green salads. Fruits All fresh, canned (in natural juice), or frozen fruits. Meat and Other Protein Products Ground beef (85% or leaner), grass-fed beef, or beef trimmed of fat. Skinless chicken or [place]. Ground chicken or [place]. Pork trimmed of fat. All fish and seafood. Eggs. Dried beans, peas, or lentils. Unsalted nuts or seeds. Unsalted  canned or dry beans. Dairy Low-fat dairy products, such as skim or 1% milk, 2% or reduced-fat cheeses, low-fat ricotta or cottage cheese, or plain low-fat yogurt. Fats and Oils Tub margarines without trans fats. Light or reduced-fat mayonnaise and salad dressings. Avocado. Olive, canola, sesame, or safflower oils. Natural peanut or almond butter (choose ones without added sugar and oil). The items listed above may not be a complete list of recommended foods or beverages. Contact your dietitian for more options. What foods are not recommended? Grains White bread. White pasta. White rice. Cornbread. Bagels, pastries, and croissants. Crackers that contain trans fat. Vegetables White potatoes. Corn. Creamed or fried vegetables. Vegetables in a cheese sauce. Fruits Dried fruits. Canned fruit in light or heavy syrup. Fruit juice. Meat and Other Protein Products Fatty cuts of meat. Ribs, chicken wings, bacon, sausage, bologna, salami, chitterlings, fatback, hot dogs, bratwurst, and packaged luncheon meats. Liver and organ meats. Dairy Whole or 2% milk, cream, half-and-half, and cream cheese. Whole milk cheeses. Whole-fat or sweetened yogurt. Full-fat cheeses. Nondairy creamers and whipped toppings. Processed cheese, cheese spreads, or cheese curds. Sweets and Desserts Corn syrup, sugars, honey, and molasses. Candy. Jam and jelly. Syrup. Sweetened cereals. Cookies, pies, cakes, donuts, muffins, and ice cream. Fats and Oils Butter, stick margarine, lard, shortening, ghee, or bacon fat. Coconut, palm kernel, or palm oils. Beverages Alcohol. Sweetened drinks (such as sodas, lemonade, and fruit drinks or punches). The items listed above may not be a complete list of foods and beverages to avoid. Contact your dietitian for more information. This information is not intended to replace advice given to you by your health care provider. Make sure you discuss any questions you have with your health care  provider. Document Released: 12/13/2011 Document Revised: 02/18/2016 Document Reviewed: 09/12/2013 Elsevier Interactive Patient Education  [employer].

## 2018-01-30 ENCOUNTER — Emergency Department (HOSPITAL_COMMUNITY)
Admission: EM | Admit: 2018-01-30 | Discharge: 2018-01-30 | Disposition: A | Discharge status: Home / Self Care | Payer: Medicare Other | Attending: Emergency Medicine | Admitting: Emergency Medicine

## 2018-01-30 DIAGNOSIS — K8050 Calculus of bile duct without cholangitis or cholecystitis without obstruction: Secondary | ICD-10-CM

## 2018-01-30 LAB — URINALYSIS, ROUTINE W REFLEX MICROSCOPIC
Bilirubin Urine: NEGATIVE
Glucose, UA: NEGATIVE mg/dL
HGB URINE DIPSTICK: NEGATIVE
Ketones, ur: NEGATIVE mg/dL
LEUKOCYTES UA: NEGATIVE
NITRITE: NEGATIVE
PROTEIN: NEGATIVE mg/dL
SPECIFIC GRAVITY, URINE: 1.02 (ref 1.005–1.030)
pH: 7 (ref 5.0–8.0)

## 2018-01-30 LAB — LIPASE, BLOOD: Lipase: 46 U/L (ref 11–51)

## 2018-01-30 LAB — COMPREHENSIVE METABOLIC PANEL
ALK PHOS: 59 U/L (ref 38–126)
ALT: 12 U/L (ref 0–44)
ANION GAP: 9 (ref 5–15)
AST: 18 U/L (ref 15–41)
Albumin: 3.4 g/dL — ABNORMAL LOW (ref 3.5–5.0)
BUN: 19 mg/dL (ref 8–23)
CALCIUM: 8.7 mg/dL — AB (ref 8.9–10.3)
CO2: 26 mmol/L (ref 22–32)
CREATININE: 0.79 mg/dL (ref 0.44–1.00)
Chloride: 100 mmol/L (ref 98–111)
Glucose, Bld: 145 mg/dL — ABNORMAL HIGH (ref 70–99)
Potassium: 4 mmol/L (ref 3.5–5.1)
Sodium: 135 mmol/L (ref 135–145)
TOTAL PROTEIN: 7.3 g/dL (ref 6.5–8.1)
Total Bilirubin: 0.6 mg/dL (ref 0.3–1.2)

## 2018-01-30 LAB — CBC
HCT: 36.7 % (ref 36.0–46.0)
HEMOGLOBIN: 12.2 g/dL (ref 12.0–15.0)
MCH: 28.8 pg (ref 26.0–34.0)
MCHC: 33.2 g/dL (ref 30.0–36.0)
MCV: 86.8 fL (ref 78.0–100.0)
PLATELETS: 252 10*3/uL (ref 150–400)
RBC: 4.23 MIL/uL (ref 3.87–5.11)
RDW: 18.1 % — ABNORMAL HIGH (ref 11.5–15.5)
WBC: 11.7 10*3/uL — AB (ref 4.0–10.5)

## 2018-01-30 MED ORDER — OXYCODONE-ACETAMINOPHEN 5-325 MG PO TABS: 1.0000 | ORAL_TABLET | ORAL | 0 refills | Status: DC | PRN

## 2018-01-30 MED ORDER — OXYCODONE-ACETAMINOPHEN 5-325 MG PO TABS
1.0000 | ORAL_TABLET | Freq: Once | ORAL | Status: AC
  Administered 2018-01-30: 1 via ORAL
  Filled 2018-01-30: qty 1

## 2018-01-30 NOTE — Discharge Instructions (Signed)
Please have your surgeon and cardiologist to discuss if there is a way to have her surgery safely.  Without surgery, I am concerned that she will continue to have episodes of pain like tonight.

## 2018-01-30 NOTE — ED Provider Notes (Signed)
[facility] EMERGENCY DEPARTMENT Provider Note   CSN: [id number] Arrival date & time: 01/29/18  2246     History   Chief Complaint Chief Complaint  Patient presents with  . Abdominal Pain    HPI Dana [name] is a 77 y.o. female.  The history is provided by the patient.  She has history of rheumatoid arthritis, hypertension, hyperlipidemia, hypertensive obstructive cardiomyopathy, coronary artery disease and comes in complaining of abdominal pain which started this evening.  Pain is in the epigastric area and right upper quadrant with some radiation to the right lower quadrant.  There is no radiation to the back or chest.  There is no nausea or vomiting.  Nothing makes pain better, nothing makes it worse.  Past Medical History:  Diagnosis Date  . CAD (coronary artery disease)    a. s/p DES to OM1 in 02/2017  . Essential hypertension   . Gout   . HOCM (hypertrophic obstructive cardiomyopathy) (HCC)   . Hyperlipidemia   . Rheumatoid arthritis [facility])     Patient Active Problem List   Diagnosis Date Noted  . RUQ pain   . Acute on chronic diastolic CHF (congestive heart failure) (HCC) 01/26/2018  . Rheumatoid arthritis (HCC) 01/26/2018  . Elevated troponin 01/26/2018  . Acute lower UTI 01/26/2018  . Abdominal pain 01/26/2018  . GERD (gastroesophageal reflux disease) 01/26/2018  . Precordial chest pain   . CAD (coronary artery disease) 09/17/2017  . HOCM (hypertrophic obstructive cardiomyopathy) (HCC)   . Unstable angina (HCC)   . Coronary artery disease involving native coronary artery of native heart with unstable angina pectoris (HCC)   . Chest pain 01/29/2017  . Hypertrophic cardiomyopathy (HCC) 01/29/2017  . MR (mitral regurgitation) 01/29/2017  . HTN (hypertension) 01/29/2017  . Hyperlipidemia 01/29/2017  . Gout 01/29/2017  . Arthritis 01/29/2017  . Anemia 01/29/2017    Past Surgical History:  Procedure Laterality Date  . CORONARY BALLOON ANGIOPLASTY N/A  03/03/2017   Procedure: CORONARY BALLOON ANGIOPLASTY;  Surgeon: Marykay Lex, MD;  Location: [facility] INVASIVE CV LAB;  Service: Cardiovascular;  Laterality: N/A;  . CORONARY STENT INTERVENTION N/A 03/03/2017   Procedure: CORONARY STENT INTERVENTION;  Surgeon: Marykay Lex, MD;  Location: [facility] INVASIVE CV LAB;  Service: Cardiovascular;  Laterality: N/A;  . LEFT HEART CATH AND CORONARY ANGIOGRAPHY N/A 03/02/2017   Procedure: LEFT HEART CATH AND CORONARY ANGIOGRAPHY;  Surgeon: Tonny Bollman, MD;  Location: [facility] INVASIVE CV LAB;  Service: Cardiovascular;  Laterality: N/A;     OB History    Gravida  7   Para      Term      Preterm      AB      Living  7     SAB      TAB      Ectopic      Multiple      Live Births               Home Medications    Prior to Admission medications   Medication Sig Start Date End Date Taking? Authorizing Provider  allopurinol (ZYLOPRIM) 300 MG tablet Take 300 mg by mouth daily as needed (for gout).     [provider]  aspirin EC 81 MG tablet Take 81 mg by mouth daily.    [provider]  atorvastatin (LIPITOR) 10 MG tablet Take 10 mg by mouth daily.    [provider]  carvedilol (COREG) 6.25 MG tablet Take 1 tablet (6.25  mg total) by mouth 2 (two) times daily with a meal. 11/16/17   Tat, Onalee Hua, MD  cephALEXin (KEFLEX) 500 MG capsule Take 1 capsule (500 mg total) by mouth 2 (two) times daily for 3 days. 01/29/18 02/01/18  Johnson, Clanford L, MD  clopidogrel (PLAVIX) 75 MG tablet TAKE 1 TABLET BY MOUTH ONCE DAILY. 11/08/17   Jonelle Sidle, MD  ezetimibe (ZETIA) 10 MG tablet Take 10 mg by mouth daily.    [provider]  famotidine (PEPCID) 20 MG tablet Take 1 tablet (20 mg total) by mouth 2 (two) times daily. 01/24/18   Gerhard Munch, MD  ferrous sulfate (FEROSUL) 325 (65 FE) MG tablet Take 325 mg by mouth daily with breakfast.    [provider]  fluticasone (FLONASE) 50 MCG/ACT nasal spray Place 2  sprays into both nostrils daily.    [provider]  folic acid (FOLVITE) 1 MG tablet Take 1 mg by mouth daily.    [provider]  furosemide (LASIX) 40 MG tablet Take 1 tablet (40 mg total) by mouth every other day. 01/29/18   Johnson, Clanford L, MD  methotrexate 2.5 MG tablet Take 15 mg by mouth once a week. On Thursday    [provider]  nitroGLYCERIN (NITROSTAT) 0.4 MG SL tablet Place 1 tablet (0.4 mg total) under the tongue every 5 (five) minutes as needed for chest pain. 03/04/17   Robbie Lis M, PA-C  pantoprazole (PROTONIX) 40 MG tablet Take 40 mg by mouth daily.    [provider]  RESTASIS MULTIDOSE 0.05 % ophthalmic emulsion PLACE ONE DROP IN BOTH EYES TWICE DAILY 12/26/16   [provider]  sucralfate (CARAFATE) 1 g tablet Take 1 tablet (1 g total) by mouth 4 (four) times daily -  with meals and at bedtime. 01/24/18   Gerhard Munch, MD  verapamil (CALAN-SR) 240 MG CR tablet Take 1 tablet (240 mg total) by mouth daily. 11/17/17   Catarina Hartshorn, MD    Family History Family History  Problem Relation Age of Onset  . Hypertension Mother   . Liver disease Mother   . Alcohol abuse Father     Social History Social History   Tobacco Use  . Smoking status: Never Smoker  . Smokeless tobacco: Never Used  Substance Use Topics  . Alcohol use: No  . Drug use: No     Allergies   Patient has no known allergies.   Review of Systems Review of Systems  All other systems reviewed and are negative.    Physical Exam Updated Vital Signs BP 132/72   Pulse 80   Temp 98.6 F (37 C)   Resp 18   Ht 5\' 2"  (1.575 m)   Wt 78.9 kg (174 lb)   SpO2 96%   BMI 31.83 kg/m   Physical Exam  Nursing note and vitals reviewed.  77 year old female, resting comfortably and in no acute distress. Vital signs are normal. Oxygen saturation is 96%, which is normal. Head is normocephalic and atraumatic. PERRLA, EOMI. Oropharynx is clear. Neck is  nontender and supple without adenopathy or JVD. Back is nontender and there is no CVA tenderness. Lungs are clear without rales, wheezes, or rhonchi. Chest is nontender. Heart has regular rate and rhythm without murmur. Abdomen is soft, flat, with moderate right upper quadrant tenderness, mild epigastric tenderness.  There is no rebound or guarding.  No definite Murphy sign present.  There are no masses or hepatosplenomegaly and peristalsis is slightly  hypoactive. Extremities have no cyanosis or edema, full range of motion is present. Skin is warm and dry without rash. Neurologic: Mental status is normal, cranial nerves are intact, there are no motor or sensory deficits.  ED Treatments / Results  Labs (all labs ordered are listed, but only abnormal results are displayed) Labs Reviewed  COMPREHENSIVE METABOLIC PANEL - Abnormal; Notable for the following components:      Result Value   Glucose, Bld 145 (*)    Calcium 8.7 (*)    Albumin 3.4 (*)    All other components within normal limits  CBC - Abnormal; Notable for the following components:   WBC 11.7 (*)    RDW 18.1 (*)    All other components within normal limits  LIPASE, BLOOD  URINALYSIS, ROUTINE W REFLEX MICROSCOPIC   Procedures Procedures   Medications Ordered in ED Medications  oxyCODONE-acetaminophen (PERCOCET/ROXICET) 5-325 MG per tablet 1 tablet (has no administration in time range)     Initial Impression / Assessment and Plan / ED Course  I have reviewed the triage vital signs and the nursing notes.  Pertinent lab results that were available during my care of the patient were reviewed by me and considered in my medical decision making (see chart for details).  Right upper quadrant pain and tenderness.  Old records are reviewed, and she had a recent hospitalization at which time ultrasound did show cholelithiasis and sludge in the gallbladder.  General surgery consultation at that time indicated reluctance to do  cholecystectomy because of her underlying heart problems.  Today, labs are unremarkable.  WBC is very mildly elevated.  Normal transaminases, bilirubin, alkaline phosphatase.  Clinically, this does not appear to be acute cholecystitis, but rather biliary colic.  We will give a dose of oxycodone-acetaminophen to try to achieve pain relief.  I feel she would benefit from elective cholecystectomy.  Once adequate pain control is been achieved, she will need to follow-up with general surgery and cardiology as outpatient.  3:38 AM She had good relief of pain with oxycodone-acetaminophen, and is discharged with prescription for same.  Recommended that she follow-up with her surgeon and cardiologist to see if there is a way that she can safely have gallbladder surgery.  Otherwise, I am concerned that she will continue to have similar episodes going forward.  Final Clinical Impressions(s) / ED Diagnoses   Final diagnoses:  Biliary colic    ED Discharge Orders        Ordered    oxyCODONE-acetaminophen (PERCOCET) 5-325 MG tablet  Every 4 hours PRN     01/30/18 0335    oxyCODONE-acetaminophen (PERCOCET) 5-325 MG tablet  Every 4 hours PRN     01/30/18 0335       Dione Booze, MD 01/30/18 [phone]

## 2018-01-31 MED FILL — Oxycodone w/ Acetaminophen Tab 5-325 MG: ORAL | Qty: 6 | Status: AC

## 2018-02-03 NOTE — Progress Notes (Signed)
Cardiology Office Note  Date: 02/05/2018   ID: Dana [name], DOB [birth year], MRN [id number]  PCP: [name], MD  Primary Cardiologist: Nona Dell, MD   Chief Complaint  Patient presents with  . Coronary Artery Disease  . Cardiomyopathy    History of Present Illness: Kodee Drury is a 77 y.o. female native of [place] in the [place] last seen in the office by Ms. Stader PA-C in April. She was just recently evaluated as an inpatient in consultation by Dr. Wyline Mood on August 2 for suspected mild acute on chronic diastolic heart failure and mildly elevated troponin I (flat pattern 0.3 to 0.4 range).  This was in the setting of biliary colic.  I reviewed her records, she is apparently anticipating follow-up with Dr. Lovell Sheehan to discuss possible surgical management, presumably cholecystectomy.  Patient's son is present and assists with communication given language barrier.  She does not indicate any angina symptoms, states that her breathing is at baseline, has no leg swelling.  She does continue to have intermittent abdominal discomfort.  No nausea or emesis.  Cardiac catheterization and PCI from September 2018 is outlined below.  She has been on aspirin and Plavix, essentially now 1 year out from DES intervention.  I reviewed the recent ECG and her echocardiogram done earlier this year.  RCRI risk calculator indicates class III risk, 6.6% chance of major cardiac event presuming cholecystectomy is pursued.  Past Medical History:  Diagnosis Date  . CAD (coronary artery disease)    a. s/p DES to OM1 in 02/2017  . Essential hypertension   . Gout   . HOCM (hypertrophic obstructive cardiomyopathy) (HCC)   . Hyperlipidemia   . Rheumatoid arthritis [facility])     Past Surgical History:  Procedure Laterality Date  . CORONARY BALLOON ANGIOPLASTY N/A 03/03/2017   Procedure: CORONARY BALLOON ANGIOPLASTY;  Surgeon: Marykay Lex, MD;  Location: [facility] INVASIVE CV LAB;  Service:  Cardiovascular;  Laterality: N/A;  . CORONARY STENT INTERVENTION N/A 03/03/2017   Procedure: CORONARY STENT INTERVENTION;  Surgeon: Marykay Lex, MD;  Location: [facility] INVASIVE CV LAB;  Service: Cardiovascular;  Laterality: N/A;  . LEFT HEART CATH AND CORONARY ANGIOGRAPHY N/A 03/02/2017   Procedure: LEFT HEART CATH AND CORONARY ANGIOGRAPHY;  Surgeon: Tonny Bollman, MD;  Location: [facility] INVASIVE CV LAB;  Service: Cardiovascular;  Laterality: N/A;    Current Outpatient Medications  Medication Sig Dispense Refill  . allopurinol (ZYLOPRIM) 300 MG tablet Take 300 mg by mouth daily as needed (for gout).     [name] aspirin EC 81 MG tablet Take 81 mg by mouth daily.    [name] atorvastatin (LIPITOR) 10 MG tablet Take 10 mg by mouth daily.    . carvedilol (COREG) 6.25 MG tablet Take 1 tablet (6.25 mg total) by mouth 2 (two) times daily with a meal. 60 tablet 1  . clopidogrel (PLAVIX) 75 MG tablet TAKE 1 TABLET BY MOUTH ONCE DAILY. 90 tablet 3  . ezetimibe (ZETIA) 10 MG tablet Take 10 mg by mouth daily.    . famotidine (PEPCID) 20 MG tablet Take 1 tablet (20 mg total) by mouth 2 (two) times daily. 30 tablet 0  . ferrous sulfate (FEROSUL) 325 (65 FE) MG tablet Take 325 mg by mouth daily with breakfast.    . fluticasone (FLONASE) 50 MCG/ACT nasal spray Place 2 sprays into both nostrils daily.    . folic acid (FOLVITE) 1 MG tablet Take 1 mg by mouth daily.    . furosemide (LASIX)  40 MG tablet Take 1 tablet (40 mg total) by mouth every other day. 30 tablet   . methotrexate 2.5 MG tablet Take 15 mg by mouth once a week. On Thursday    . nitroGLYCERIN (NITROSTAT) 0.4 MG SL tablet Place 1 tablet (0.4 mg total) under the tongue every 5 (five) minutes as needed for chest pain. 25 tablet 2  . oxyCODONE-acetaminophen (PERCOCET) 5-325 MG tablet Take 1 tablet by mouth every 4 (four) hours as needed for moderate pain. 10 tablet 0  . pantoprazole (PROTONIX) 40 MG tablet Take 40 mg by mouth daily.    . RESTASIS MULTIDOSE 0.05 %  ophthalmic emulsion PLACE ONE DROP IN BOTH EYES TWICE DAILY  4  . sucralfate (CARAFATE) 1 g tablet Take 1 tablet (1 g total) by mouth 4 (four) times daily -  with meals and at bedtime. 21 tablet 0  . verapamil (CALAN-SR) 240 MG CR tablet Take 1 tablet (240 mg total) by mouth daily. 30 tablet 1   No current facility-administered medications for this visit.    Allergies:  Patient has no known allergies.   Social History: The patient  reports that she has never smoked. She has never used smokeless tobacco. She reports that she does not drink alcohol or use drugs.   ROS:  Please see the history of present illness. Otherwise, complete review of systems is positive for intermittent abdominal pain.  All other systems are reviewed and negative.   Physical Exam: VS:  BP 118/78 (BP Location: Left Arm, Patient Position: Sitting)   Pulse 72   Ht 5\' 2"  (1.575 m)   Wt 169 lb (76.7 kg)   SpO2 97%   BMI 30.91 kg/m , BMI Body mass index is 30.91 kg/m.  Wt Readings from Last 3 Encounters:  02/05/18 169 lb (76.7 kg)  01/29/18 174 lb (78.9 kg)  01/26/18 174 lb 14.4 oz (79.3 kg)    General: Elderly woman, appears comfortable at rest. HEENT: Wearing sunglasses, oropharynx clear with moist mucosa. Neck: Supple, no elevated JVP or carotid bruits, no thyromegaly. Lungs: Clear to auscultation, nonlabored breathing at rest. Cardiac: Regular rate and rhythm, no S3, 3/6 systolic murmur. Abdomen: Soft, nontender, bowel sounds present, no guarding or rebound. Extremities: No pitting edema, distal pulses 2+. Skin: Warm and dry. Musculoskeletal: No kyphosis. Neuropsychiatric: Alert and oriented x3, affect grossly appropriate.  ECG: I personally reviewed the tracing from 01/26/2018 which showed sinus rhythm with LVH and repolarization abnormalities.  Recent Labwork: 01/26/2018: B Natriuretic Peptide 1,106.0 01/29/2018: Magnesium 1.9 01/30/2018: ALT 12; AST 18; BUN 19; Creatinine, Ser 0.79; Hemoglobin 12.2;  Platelets 252; Potassium 4.0; Sodium 135     Component Value Date/Time   CHOL 123 09/17/2017 0627   TRIG 69 09/17/2017 0627   HDL 49 09/17/2017 0627   CHOLHDL 2.5 09/17/2017 0627   VLDL 14 09/17/2017 0627   LDLCALC 60 09/17/2017 0627    Other Studies Reviewed Today:  Echocardiogram 09/18/2017: Study Conclusions  - Left ventricle: The cavity size was normal. There was moderate   asymmetric hypertrophy of the septum. The appearance was   consistent with hypertrophic cardiomyopathy. Systolic function   was hyperdynamic. The estimated ejection fraction was in the   range of 75% to 80%. There was dynamic obstruction at rest in the   outflow tract, with a peak velocity of 330 cm/sec and a peak   gradient of 44 mm Hg. The gradient increases to 66 mm Hg with the   Valsalva maneuver. Wall motion  was normal; there were no regional   wall motion abnormalities. Features are consistent with a   pseudonormal left ventricular filling pattern, with concomitant   abnormal relaxation and increased filling pressure (grade 2   diastolic dysfunction). - Mitral valve: Calcified annulus. There was mild systolic anterior   motion of the anterior leaflet. There was mild to moderate   regurgitation directed eccentrically and posteriorly. - Left atrium: The atrium was mildly dilated. - Pulmonary arteries: PA peak pressure: 31 mm Hg (S).  Cardiac catheterization and PCI 03/14/2017:   Ost LM lesion, 40 %stenosed.  1st Mrg lesion, 90 %stenosed.  A STENT SIERRA 3.00 X 12 MM drug eluting stent was successfully placed.  Post intervention, there is a 0% residual stenosis.  Lat 1st Mrg lesion, 60 %stenosed. Bifurcation lesion jailed by the initial stent  Post intervention - rescue PTCA only, there is a 20% residual stenosis.   Successful bifurcation PCI of large OM1 with major branch of OM1 PTCA. A Xience Sierra DES stent was used for the main branch and then PTCA was performed through the stent into  the side branch for ostial stenosis.  1. Severe single-vessel coronary artery disease involving a large first obtuse marginal branch of the left dominant circumflex 2. Moderate LAD stenosis 3. Small nondominant RCA with no significant obstruction 4. Mild to moderate left main stenosis 5. Known hypertrophic cardiomyopathy  Recommend PCI of the obtuse marginal. 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 The patient will be loaded with Plavix. Will schedule her PCI for tomorrow. Would use femoral artery access and consider either a 5 [place] guide catheter or a guide catheter with sideholes. Plan discussed at length with the patient's daughter. Note the patient is non-English speaking.   Assessment and Plan:  1.  Preoperative cardiac evaluation in anticipation of possible cholecystectomy (not certain whether laparoscopic or open), pending consultation with Dr. Lovell Sheehan.  Patient at age 32 has history of hypertension, hypertrophic obstructive cardiomyopathy, hyperlipidemia, and CAD status post DES to the first obtuse marginal in September 2018 with mild to moderate residual disease.  I reviewed her recent hospital course, it is unlikely that she had an ACS with mild flat troponin I elevations noted in the setting of HOCM and possibly mild diastolic heart failure.  She is nearly 1 year out from DES intervention on aspirin and Plavix, at this point could reasonably hold antiplatelet medications for surgery.  As indicated above RCRI risk calculator predicts class III, 6.6% chance of major cardiac events.  She does not require further cardiac testing at this time.  I would anticipate that she should be able to proceed with cholecystectomy off antiplatelet regimen with continuation of baseline cardiac regimen at overall intermediate risk.  Keep office visit with Dr. Lovell Sheehan, if additional concerns about surgical risk remain, she could always be referred for  surgical assessment [place] in order to have surgery done at tertiary care center.  2.  CAD status post DES to the first obtuse marginal in September 2018.  Anticipate continuing aspirin and Plavix through September, although this could be interrupted within the next few weeks if cholecystectomy is pursued.  Otherwise continue Lipitor, Coreg, Zetia, and as needed nitroglycerin.  3. HOCM, echocardiogram from earlier this year is reviewed above.  She had recent possible mild diastolic heart failure exacerbation and is on Lasix.  Would be careful not to over diuresis as this may also worsen symptoms.  He is  clinically stable at this time.  4.  Mixed hyperlipidemia, on Zetia and Lipitor, recent LDL 60.  Current medicines were reviewed with the patient today.  Disposition: Follow-up in 3 months.  Signed, Jonelle Sidle, MD, [facility] 02/05/2018 9:42 AM    [facility] [address] [place], [place] [address] Phone: [phone]; Fax: [phone]

## 2018-02-05 ENCOUNTER — Ambulatory Visit (INDEPENDENT_AMBULATORY_CARE_PROVIDER_SITE_OTHER): Payer: Medicare Other | Admitting: Cardiology

## 2018-02-05 ENCOUNTER — Encounter: Payer: Self-pay | Admitting: Cardiology

## 2018-02-05 VITALS — BP 118/78 | HR 72 | Ht 62.0 in | Wt 169.0 lb

## 2018-02-05 DIAGNOSIS — I422 Other hypertrophic cardiomyopathy: Secondary | ICD-10-CM

## 2018-02-05 DIAGNOSIS — Z01810 Encounter for preprocedural cardiovascular examination: Secondary | ICD-10-CM | POA: Diagnosis not present

## 2018-02-05 DIAGNOSIS — E782 Mixed hyperlipidemia: Secondary | ICD-10-CM | POA: Diagnosis not present

## 2018-02-05 DIAGNOSIS — I25119 Atherosclerotic heart disease of native coronary artery with unspecified angina pectoris: Secondary | ICD-10-CM | POA: Diagnosis not present

## 2018-02-05 NOTE — Patient Instructions (Addendum)
Your physician wants you to follow-up in: 3 months with Dr.McDowell       Your physician recommends that you continue on your current medications as directed. Please refer to the Current Medication list given to you today.    If you need a refill on your cardiac medications before your next appointment, please call your pharmacy.      No labs or tests today.       Thank you for choosing [facility] !        

## 2018-02-12 DIAGNOSIS — N390 Urinary tract infection, site not specified: Secondary | ICD-10-CM | POA: Insufficient documentation

## 2018-02-12 DIAGNOSIS — K8050 Calculus of bile duct without cholangitis or cholecystitis without obstruction: Secondary | ICD-10-CM | POA: Insufficient documentation

## 2018-02-12 DIAGNOSIS — Z79899 Other long term (current) drug therapy: Secondary | ICD-10-CM | POA: Insufficient documentation

## 2018-02-26 IMAGING — DX DG CHEST 2V
2 series · 2 of 2 positions shown · non-contrast
Comparison: Chest radiographs and CTA 01/29/2017

CLINICAL DATA: Chest pain and weakness for 1 day.

EXAM:
CHEST  2 VIEW

[chest pa]
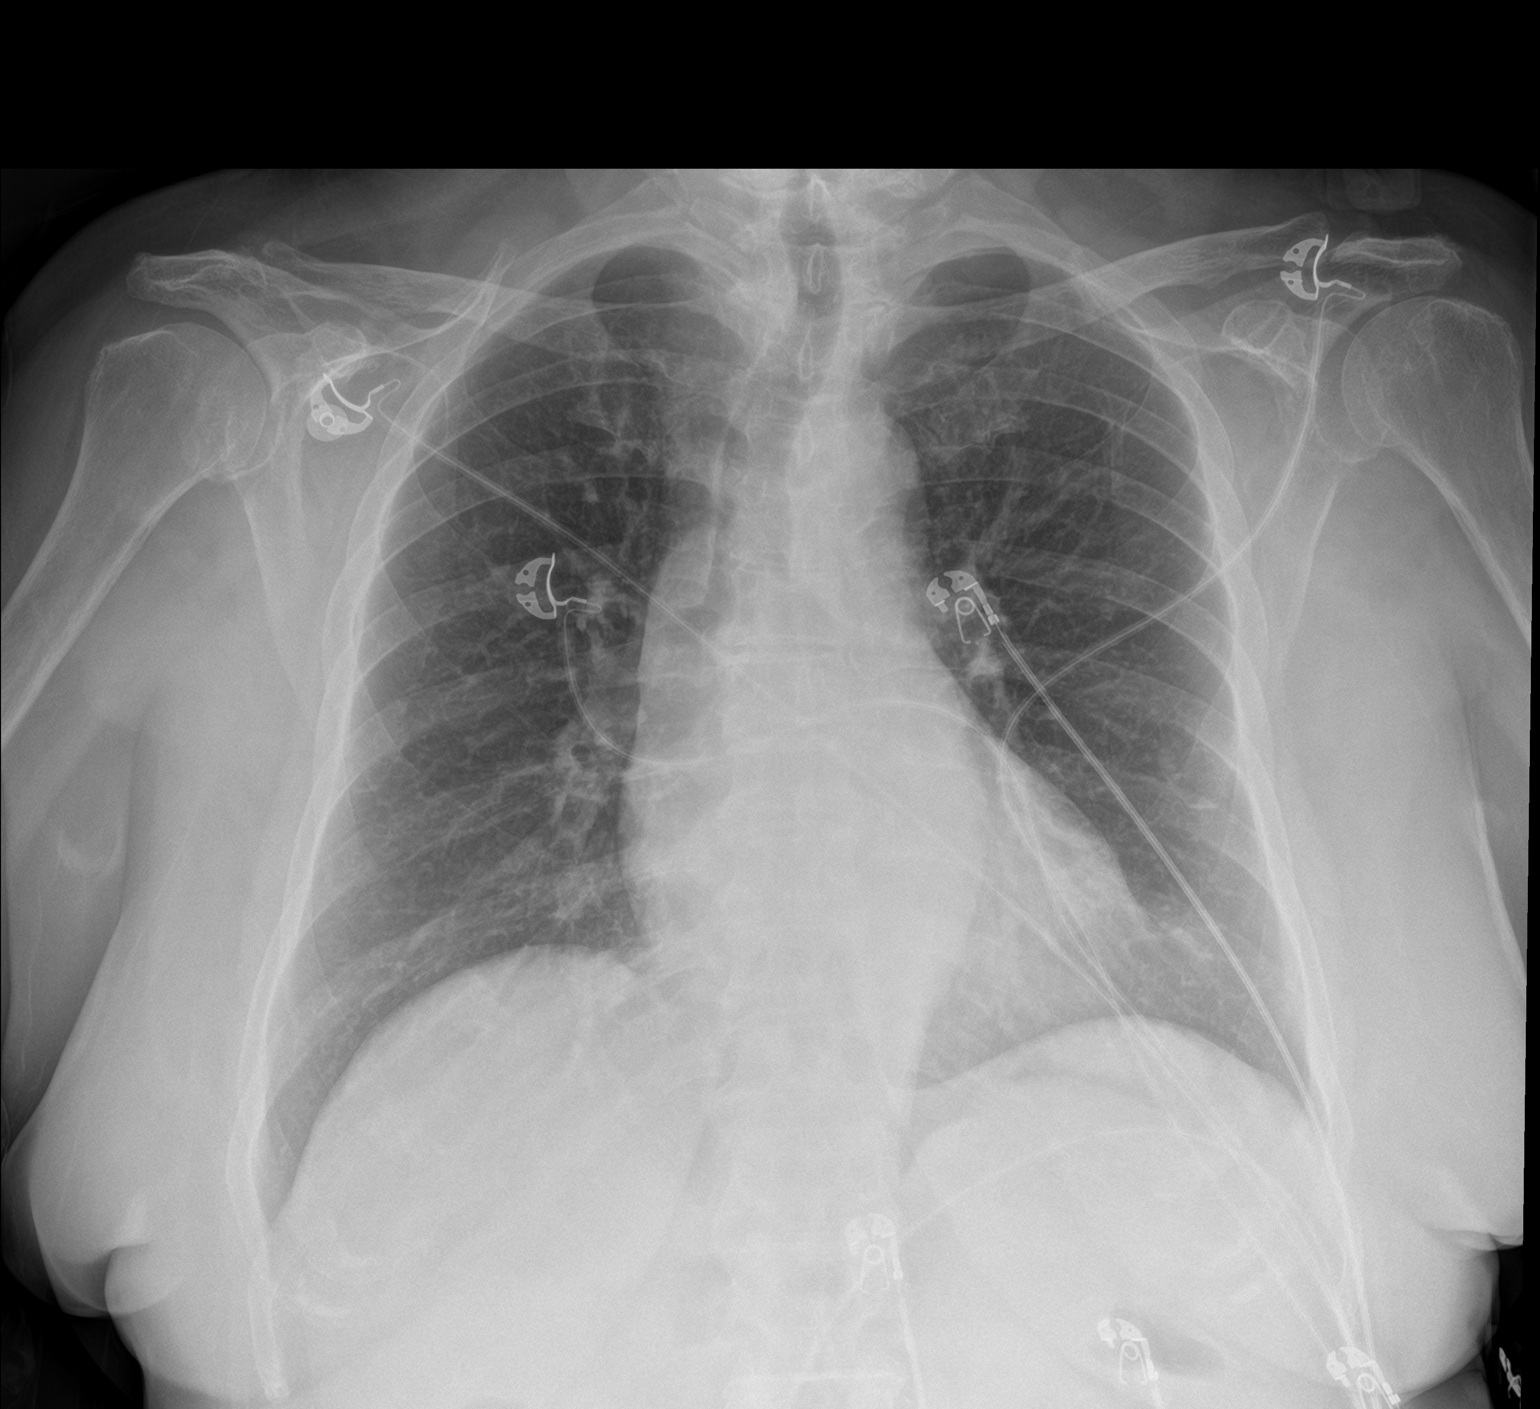

[chest lat]
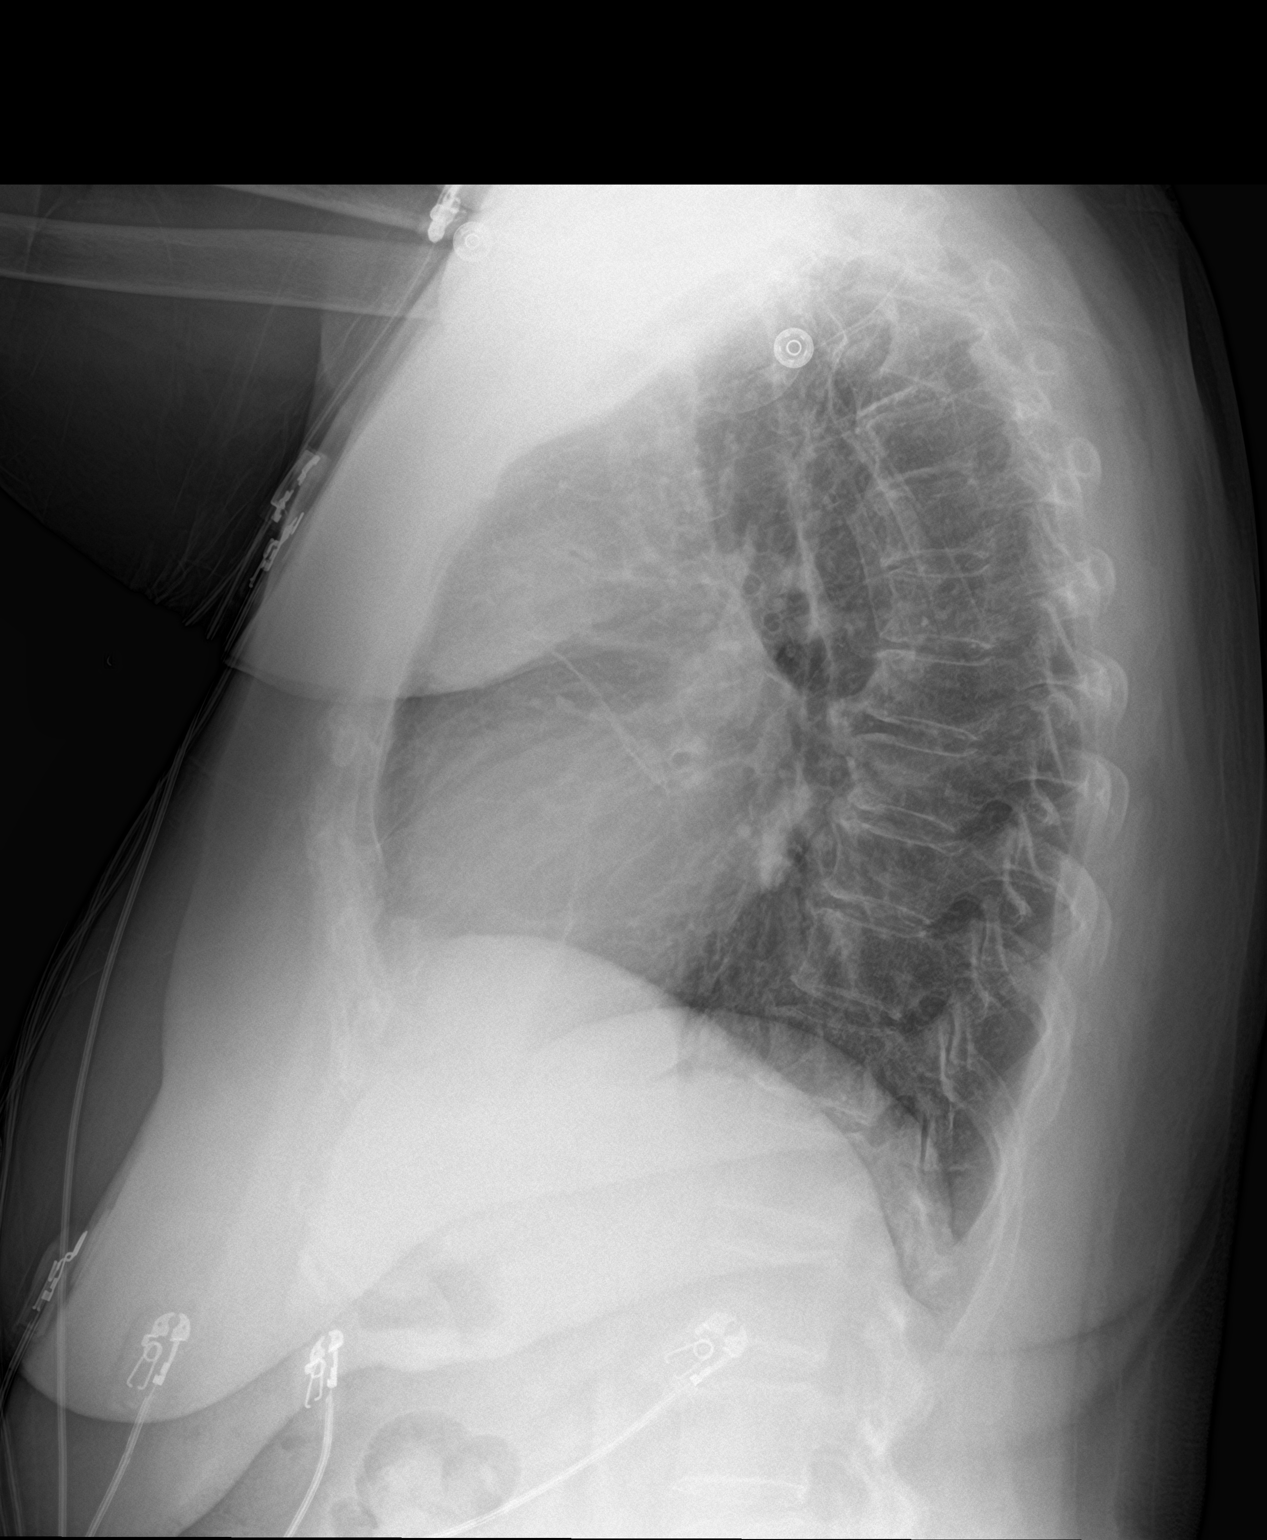

[2 of 2 positions shown; findings below may reference images not displayed]

FINDINGS: The cardiomediastinal silhouette is unchanged. Heart size is within
normal limits. The descending thoracic aorta is mildly tortuous. The
lungs are clear aside from minimal atelectasis or scarring in the
left lung base. No pleural effusion or pneumothorax is identified.
No acute osseous abnormality is seen.
IMPRESSION: No active cardiopulmonary disease.

## 2018-03-01 ENCOUNTER — Ambulatory Visit (INDEPENDENT_AMBULATORY_CARE_PROVIDER_SITE_OTHER): Payer: Medicare Other | Admitting: General Surgery

## 2018-03-01 ENCOUNTER — Encounter: Payer: Self-pay | Admitting: General Surgery

## 2018-03-01 VITALS — BP 138/69 | HR 67 | Temp 97.7°F | Resp 18 | Wt 173.0 lb

## 2018-03-01 DIAGNOSIS — K8020 Calculus of gallbladder without cholecystitis without obstruction: Secondary | ICD-10-CM | POA: Diagnosis not present

## 2018-03-01 DIAGNOSIS — I25119 Atherosclerotic heart disease of native coronary artery with unspecified angina pectoris: Secondary | ICD-10-CM | POA: Diagnosis not present

## 2018-03-01 NOTE — Patient Instructions (Signed)
Clico biliar en los adultos (Biliary Colic, Adult) El clico biliar es un dolor intenso que lo produce un pequeo rgano que se encuentra en la parte superior derecha del vientre (vescula). La vescula almacena un fluido digestivo que se produce en el hgado (bilis) y que permite que el cuerpo degrade la grasa. La bilis y otras enzimas digestivas son transportadas desde el hgado hasta el intestino delgado a travs de estructuras con forma de tubos (conductillos biliares). La vescula y los conductillos biliares forman las vas biliares. A veces se forman depsitos duros de fluidos digestivos en la vescula (clculos biliares) que bloquean el flujo de bilis hacia esta y as se produce un clico biliar. Esta afeccin tambin se conoce como ataque de la vescula biliar. Los clculos biliares pueden ser tan pequeos como un grano de arena o tan grandes como una pelota de golf. Es posible que haya un solo clculo biliar en la vescula o que haya muchos. CAUSAS A menudo la causa del clico biliar son los clculos en la vescula biliar. Con menor frecuencia, un tumor puede bloquear el flujo de bilis desde la vescula y causar un clico biliar. FACTORES DE RIESGO Es ms probable que esta afeccin se manifieste en:  Mujeres.  Personas con ascendencia hispnica.  Personas con antecedentes familiares de clculos biliares.  Personas obesas.  Personas que [place] o bajan de peso rpidamente.  Personas que tienen una dieta hipercalrica, baja en fibra y rica en carbohidratos refinados, como el pan blanco y el arroz blanco.  Personas que tienen una enfermedad intestinal que afecta la absorcin de nutrientes, como la enfermedad de Crohn.  Personas con enfermedades metablicas, como el sndrome metablico o la diabetes. SNTOMAS El dolor intenso en la zona superior derecha del estmago es el principal sntoma del clico biliar. Es posible que sienta este dolor debajo del pecho pero encima de la cadera.  Este dolor ocurre con frecuencia por la noche o despus de comer una comida muy grasosa. El dolor puede empeorar por hasta una hora y durar hasta 12horas. En la [employer], el dolor se desvanece (desaparece) en un par de horas. Entre los otros sntomas de esta afeccin, se incluye lo siguiente:  Nuseas y vmitos.  Dolor debajo del hombro derecho. DIAGNSTICO Esta afeccin se diagnostica en funcin de los sntomas, los antecedentes mdicos y un examen fsico. Tambin pueden hacerle exmenes que incluyen lo siguiente:  Anlisis de sangre para descartar una infeccin o inflamacin de los conductillos biliares, de la vescula, del pncreas o del hgado.  Estudios de diagnstico por imgenes, por ejemplo: ? Ecografa. ? Tomografa computarizada (TC). ? Resonancia magntica (RM). En ciertos casos, es posible que le realicen un estudio de diagnstico por imgenes con una pequea cantidad de material radioactivo (medicina nuclear) para Pharmacist, hospital diagnstico. TRATAMIENTO El tratamiento de esta afeccin puede incluir medicamentos para Acupuncturist dolor o las nuseas. Si tiene clculos biliares que le producen clicos biliares, es posible que necesite una ciruga para extraer la vescula (colecistectoma). Los clculos biliares tambin se pueden disolver gradualmente con medicamentos. Pueden transcurrir meses o aos antes de que desaparezcan totalmente. INSTRUCCIONES PARA EL CUIDADO EN EL HOGAR  Tome los medicamentos de venta libre y los recetados solamente como se lo haya indicado el mdico.  Beba suficiente lquido para Pharmacologist la orina clara o de color amarillo plido.  Siga las indicaciones del mdico respecto de las restricciones para las comidas o las bebidas. Estas pueden incluir evitar: ? Alimentos grasos y fritos. ?  Cualquier comida que Engineer, maintenance (IT). ? Comer en exceso. ? Ingerir una comida abundante despus de no haber comido por [employer] .  Concurra a todas  las visitas de control como se lo haya indicado el mdico. Esto es importante. PREVENCIN Los pasos a seguir para Nutritional therapist afeccin incluyen:  Hydrographic surveyor adecuado.  Practicar actividad fsica con regularidad.  Tener una dieta sana, alta en fibras y baja en grasas.  Limitar la cantidad de azcar y carbohidratos refinados que se consumen, como dulces, harinas blancas y arroz blanco. SOLICITE ATENCIN MDICA SI:  El dolor le dura ms de 5horas.  Vomita.  Siente escalofros o tiene fiebre.  El dolor [place]. SOLICITE ATENCIN MDICA DE INMEDIATO SI:  Tiene un color amarillo en la piel o en la zona blanca del ojo (ictericia).  Tiene la [employer] t y las heces muy claras.  Se siente mareado o se desmaya. Esta informacin no tiene Theme park manager el consejo del mdico. Asegrese de hacerle al mdico cualquier pregunta que tenga. Document Released: 09/20/2007 Document Revised: 10/28/2014 Document Reviewed: 12/28/2015 Elsevier Interactive Patient Education  2018 [employer].

## 2018-03-01 NOTE — Progress Notes (Signed)
Subjective:     Dana [name]  Here for follow-up hospitalization of right upper quadrant abdominal pain.  Patient states that she has not had any right upper quadrant abdominal pain, nausea, vomiting since her discharge in early August.  She denies any fever, chills, jaundice. Objective:    BP 138/69 (BP Location: Left Arm, Patient Position: Sitting, Cuff Size: Normal)   Pulse 67   Temp 97.7 F (36.5 C) (Temporal)   Resp 18   Wt 173 lb (78.5 kg)   BMI 31.64 kg/m   General:  alert, cooperative and no distress  Abdomen is soft, nontender, nondistended.  No hepatosplenomegaly, masses, hernias are appreciated due to her body habitus.     Assessment:    Cholelithiasis, currently asymptomatic    Plan:   Patient is a known high risk for surgical intervention due to cardiac disease.  As she has no symptoms of biliary colic, will delay any surgical intervention for now.  The son and patient both agreed with this plan.  Follow-up as needed.

## 2018-03-06 ENCOUNTER — Ambulatory Visit: Payer: Medicare Other | Admitting: General Surgery

## 2018-05-07 NOTE — Progress Notes (Signed)
Cardiology Office Note  Date: 05/08/2018   ID: Dana [name], DOB [birth year], MRN [id number]  PCP: [name], MD  Primary Cardiologist: Nona Dell, MD   Chief Complaint  Patient presents with  . Coronary Artery Disease    History of Present Illness: Dana [name] is a 77 y.o. female native of [place] in the [place]  last seen in August.  She is here today with her son who helps to translate.  Since her last visit she has done well, no angina symptoms or nitroglycerin use.  Stable dyspnea on exertion.  She recently got back from a visit to family in [place], she stayed there for a month.  Reviewed her medications.  Cardiac regimen includes aspirin, Plavix, Coreg, Lipitor, Zetia, Lasix, verapamil, and as needed nitroglycerin.  Echocardiogram from March revealed moderate asymmetric septal hypertrophy with LVEF 75 to 80%, peak LVOT gradient of 66 mmHg with Valsalva, also grade 2 diastolic dysfunction.  Mild to moderate mitral regurgitation with SAM.  Past Medical History:  Diagnosis Date  . CAD (coronary artery disease)    a. s/p DES to OM1 in 02/2017  . Essential hypertension   . Gout   . HOCM (hypertrophic obstructive cardiomyopathy) (HCC)   . Hyperlipidemia   . Rheumatoid arthritis [facility])     Past Surgical History:  Procedure Laterality Date  . CORONARY BALLOON ANGIOPLASTY N/A 03/03/2017   Procedure: CORONARY BALLOON ANGIOPLASTY;  Surgeon: Marykay Lex, MD;  Location: [facility] INVASIVE CV LAB;  Service: Cardiovascular;  Laterality: N/A;  . CORONARY STENT INTERVENTION N/A 03/03/2017   Procedure: CORONARY STENT INTERVENTION;  Surgeon: Marykay Lex, MD;  Location: [facility] INVASIVE CV LAB;  Service: Cardiovascular;  Laterality: N/A;  . LEFT HEART CATH AND CORONARY ANGIOGRAPHY N/A 03/02/2017   Procedure: LEFT HEART CATH AND CORONARY ANGIOGRAPHY;  Surgeon: Tonny Bollman, MD;  Location: [facility] INVASIVE CV LAB;  Service: Cardiovascular;  Laterality: N/A;    Current Outpatient  Medications  Medication Sig Dispense Refill  . allopurinol (ZYLOPRIM) 300 MG tablet Take 300 mg by mouth daily as needed (for gout).     [name] aspirin EC 81 MG tablet Take 81 mg by mouth daily.    [name] atorvastatin (LIPITOR) 10 MG tablet Take 10 mg by mouth daily.    . carvedilol (COREG) 6.25 MG tablet Take 1 tablet (6.25 mg total) by mouth 2 (two) times daily with a meal. 60 tablet 1  . clopidogrel (PLAVIX) 75 MG tablet TAKE 1 TABLET BY MOUTH ONCE DAILY. 90 tablet 3  . ezetimibe (ZETIA) 10 MG tablet Take 10 mg by mouth daily.    . famotidine (PEPCID) 20 MG tablet Take 1 tablet (20 mg total) by mouth 2 (two) times daily. 30 tablet 0  . ferrous sulfate (FEROSUL) 325 (65 FE) MG tablet Take 325 mg by mouth daily with breakfast.    . fluticasone (FLONASE) 50 MCG/ACT nasal spray Place 2 sprays into both nostrils daily.    . folic acid (FOLVITE) 1 MG tablet Take 1 mg by mouth daily.    . furosemide (LASIX) 40 MG tablet Take 1 tablet (40 mg total) by mouth every other day. 30 tablet   . methotrexate 2.5 MG tablet Take 15 mg by mouth once a week. On Thursday    . nitroGLYCERIN (NITROSTAT) 0.4 MG SL tablet Place 1 tablet (0.4 mg total) under the tongue every 5 (five) minutes as needed for chest pain. 25 tablet 2  . oxyCODONE-acetaminophen (PERCOCET) 5-325 MG tablet Take 1  tablet by mouth every 4 (four) hours as needed for moderate pain. 10 tablet 0  . pantoprazole (PROTONIX) 40 MG tablet Take 40 mg by mouth daily.    . RESTASIS MULTIDOSE 0.05 % ophthalmic emulsion PLACE ONE DROP IN BOTH EYES TWICE DAILY  4  . sucralfate (CARAFATE) 1 g tablet Take 1 tablet (1 g total) by mouth 4 (four) times daily -  with meals and at bedtime. 21 tablet 0  . verapamil (CALAN-SR) 240 MG CR tablet Take 1 tablet (240 mg total) by mouth daily. 30 tablet 1   No current facility-administered medications for this visit.    Allergies:  Patient has no known allergies.   Social History: The patient  reports that she has never smoked.  She has never used smokeless tobacco. She reports that she does not drink alcohol or use drugs.   ROS:  Please see the history of present illness. Otherwise, complete review of systems is positive for none.  All other systems are reviewed and negative.   Physical Exam: VS:  BP 120/70 (BP Location: Left Arm)   Pulse 76   Ht 5\' 2"  (1.575 m)   Wt 171 lb (77.6 kg)   SpO2 97%   BMI 31.28 kg/m , BMI Body mass index is 31.28 kg/m.  Wt Readings from Last 3 Encounters:  05/08/18 171 lb (77.6 kg)  03/01/18 173 lb (78.5 kg)  02/05/18 169 lb (76.7 kg)    General: Patient appears comfortable at rest. HEENT: Conjunctiva and lids normal, oropharynx clear. Neck: Supple, no elevated JVP or carotid bruits, no thyromegaly. Lungs: Clear to auscultation, nonlabored breathing at rest. Cardiac: Regular rate and rhythm, no S3, 3/6 harsh systolic murmur, no pericardial rub. Abdomen: Soft, nontender, bowel sounds present. Extremities: No pitting edema, distal pulses 2+. Skin: Warm and dry. Musculoskeletal: No kyphosis. Neuropsychiatric: Alert and oriented x3, affect grossly appropriate.  ECG: I personally reviewed the tracing from 01/26/2018 which showed sinus rhythm with LVH and repolarization abnormalities.  Recent Labwork: 01/26/2018: B Natriuretic Peptide 1,106.0 01/29/2018: Magnesium 1.9 01/30/2018: ALT 12; AST 18; BUN 19; Creatinine, Ser 0.79; Hemoglobin 12.2; Platelets 252; Potassium 4.0; Sodium 135     Component Value Date/Time   CHOL 123 09/17/2017 0627   TRIG 69 09/17/2017 0627   HDL 49 09/17/2017 0627   CHOLHDL 2.5 09/17/2017 0627   VLDL 14 09/17/2017 0627   LDLCALC 60 09/17/2017 0627    Other Studies Reviewed Today:  Echocardiogram 09/18/2017: Study Conclusions  - Left ventricle: The cavity size was normal. There was moderate asymmetric hypertrophy of the septum. The appearance was consistent with hypertrophic cardiomyopathy. Systolic function was hyperdynamic. The estimated  ejection fraction was in the range of 75% to 80%. There was dynamic obstruction at rest in the outflow tract, with a peak velocity of 330 cm/sec and a peak gradient of 44 mm Hg. The gradient increases to 66 mm Hg with the Valsalva maneuver. Wall motion was normal; there were no regional wall motion abnormalities. Features are consistent with a pseudonormal left ventricular filling pattern, with concomitant abnormal relaxation and increased filling pressure (grade 2 diastolic dysfunction). - Mitral valve: Calcified annulus. There was mild systolic anterior motion of the anterior leaflet. There was mild to moderate regurgitation directed eccentrically and posteriorly. - Left atrium: The atrium was mildly dilated. - Pulmonary arteries: PA peak pressure: 31 mm Hg (S).  Cardiac catheterization and PCI 03/14/2017:   Ost LM lesion, 40 %stenosed.  1st Mrg lesion, 90 %stenosed.  A STENT  SIERRA 3.00 X 12 MM drug eluting stent was successfully placed.  Post intervention, there is a 0% residual stenosis.  Lat 1st Mrg lesion, 60 %stenosed. Bifurcation lesion jailed by the initial stent  Post intervention - rescue PTCA only, there is a 20% residual stenosis.  Successful bifurcation PCI of large OM1 with major branch of OM1 PTCA. A Xience Sierra DES stent was used for the main branch and then PTCA was performed through the stent into the side branch for ostial stenosis.  1. Severe single-vessel coronary artery disease involving a large first obtuse marginal branch of the left dominant circumflex 2. Moderate LAD stenosis 3. Small nondominant RCA with no significant obstruction 4. Mild to moderate left main stenosis 5. Known hypertrophic cardiomyopathy  Recommend PCI of the obtuse marginal. 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 The patient will be loaded with Plavix. Will schedule her PCI for tomorrow. Would use  femoral artery access and consider either a 5 [place] guide catheter or a guide catheter with sideholes. Plan discussed at length with the patient's daughter. Note the patient is non-English speaking.   Assessment and Plan:  1.  Hypertrophic obstructive cardiomyopathy, probably doing well in terms of symptoms.  Echocardiogram from March is outlined above.  We will plan a follow-up study for next year.  Accommodation of Coreg and verapamil.  2.  CAD status post DES to the first obtuse marginal in September 2018.  No active angina symptoms.  She continues on aspirin and will stop Plavix at this time.  Nitroglycerin available.  She is also on statin and Zetia.  3.  Hyperlipidemia, on Lipitor and Zetia with last LDL 60.  4.  Diastolic dysfunction, intermittent leg edema.  She uses Lasix every other day, tolerating well.  We have discussed concerns about not being too aggressive with diuresis in the setting of hypertrophic obstructive cardiomyopathy.  Current medicines were reviewed with the patient today.   Orders Placed This Encounter  Procedures  . ECHOCARDIOGRAM COMPLETE    Disposition: Follow-up in 6 months.  Signed, Jonelle Sidle, MD, [facility] 05/08/2018 10:13 AM    [facility] [address] [place], [place] [address] Phone: [phone]; Fax: [phone]

## 2018-05-08 ENCOUNTER — Encounter: Payer: Self-pay | Admitting: Cardiology

## 2018-05-08 ENCOUNTER — Ambulatory Visit (INDEPENDENT_AMBULATORY_CARE_PROVIDER_SITE_OTHER): Payer: Medicare Other | Admitting: Cardiology

## 2018-05-08 VITALS — BP 120/70 | HR 76 | Ht 62.0 in | Wt 171.0 lb

## 2018-05-08 DIAGNOSIS — E782 Mixed hyperlipidemia: Secondary | ICD-10-CM

## 2018-05-08 DIAGNOSIS — I5189 Other ill-defined heart diseases: Secondary | ICD-10-CM | POA: Diagnosis not present

## 2018-05-08 DIAGNOSIS — I421 Obstructive hypertrophic cardiomyopathy: Secondary | ICD-10-CM

## 2018-05-08 DIAGNOSIS — I25119 Atherosclerotic heart disease of native coronary artery with unspecified angina pectoris: Secondary | ICD-10-CM

## 2018-05-08 NOTE — Patient Instructions (Addendum)
Medication Instructions:   STOP Plavix  If you need a refill on your cardiac medications before your next appointment, please call your pharmacy.   Lab work: None If you have labs (blood work) drawn today and your tests are completely normal, you will receive your results only by: [name] MyChart Message (if you have MyChart) OR . A paper copy in the mail If you have any lab test that is abnormal or we need to change your treatment, we will call you to review the results.  Testing/Procedures: Your physician has requested that you have an echocardiogram in 6 months. Echocardiography is a painless test that uses sound waves to create images of your heart. It provides your doctor with information about the size and shape of your heart and how well your heart's chambers and valves are working. This procedure takes approximately one hour. There are no restrictions for this procedure.    Follow-Up: At [facility], you and your health needs are our priority.  As part of our continuing mission to provide you with exceptional heart care, we have created designated Provider Care Teams.  These Care Teams include your primary Cardiologist (physician) and Advanced Practice Providers (APPs -  Physician Assistants and Nurse Practitioners) who all work together to provide you with the care you need, when you need it. You will need a follow up appointment in 6 months.  Please call our office 2 months in advance to schedule this appointment.  You may see Nona Dell, MD or one of the following Advanced Practice Providers on your designated Care Team:   Randall An, PA-C [facility]) . Jacolyn Reedy, PA-C [facility] Office)  Any Other Special Instructions Will Be Listed Below (If Applicable). None

## 2018-05-08 NOTE — Addendum Note (Signed)
Addended by: Marlyn Corporal A on: 05/08/2018 10:19 AM   Modules accepted: Orders

## 2018-05-26 ENCOUNTER — Other Ambulatory Visit: Payer: Self-pay

## 2018-05-26 ENCOUNTER — Emergency Department (HOSPITAL_COMMUNITY)
Admission: EM | Admit: 2018-05-26 | Discharge: 2018-05-26 | Disposition: A | Discharge status: Home / Self Care | Payer: Medicare Other | Attending: Emergency Medicine | Admitting: Emergency Medicine

## 2018-05-26 ENCOUNTER — Encounter (HOSPITAL_COMMUNITY): Payer: Self-pay | Admitting: Emergency Medicine

## 2018-05-26 DIAGNOSIS — Z7982 Long term (current) use of aspirin: Secondary | ICD-10-CM | POA: Insufficient documentation

## 2018-05-26 DIAGNOSIS — I10 Essential (primary) hypertension: Secondary | ICD-10-CM | POA: Diagnosis not present

## 2018-05-26 DIAGNOSIS — Z79899 Other long term (current) drug therapy: Secondary | ICD-10-CM | POA: Diagnosis not present

## 2018-05-26 DIAGNOSIS — N3000 Acute cystitis without hematuria: Secondary | ICD-10-CM | POA: Diagnosis not present

## 2018-05-26 DIAGNOSIS — R300 Dysuria: Secondary | ICD-10-CM | POA: Diagnosis present

## 2018-05-26 LAB — URINALYSIS, ROUTINE W REFLEX MICROSCOPIC
Bilirubin Urine: NEGATIVE
Glucose, UA: NEGATIVE mg/dL
Ketones, ur: NEGATIVE mg/dL
Nitrite: POSITIVE — AB
Protein, ur: NEGATIVE mg/dL
Specific Gravity, Urine: 1.021 (ref 1.005–1.030)
WBC, UA: >50 WBC/hpf — ABNORMAL HIGH (ref 0–5)
pH: 7 (ref 5.0–8.0)

## 2018-05-26 MED ORDER — CEPHALEXIN 500 MG PO CAPS
500.0000 mg | ORAL_CAPSULE | Freq: Once | ORAL | Status: AC
  Administered 2018-05-26: 500 mg via ORAL
  Filled 2018-05-26: qty 1

## 2018-05-26 MED ORDER — CEPHALEXIN 500 MG PO CAPS: 500.0000 mg | ORAL_CAPSULE | Freq: Four times a day (QID) | ORAL | 0 refills | Status: DC

## 2018-05-26 NOTE — ED Notes (Signed)
Pt reports painful urination for the last 2 days  Urine specimen in the lab

## 2018-05-26 NOTE — ED Provider Notes (Signed)
[facility] EMERGENCY DEPARTMENT Provider Note   CSN: [id number] Arrival date & time: 05/26/18  1613     History   Chief Complaint Chief Complaint  Patient presents with  . Urinary Tract Infection    HPI Dana [name] is a 77 y.o. female.  Pt presents to the ED today with a possible UTI.  Pt is from [place] and her daughter translates.  The pt has had sx for 2 days.  No fever.  No other pain, other than dysuria.     Past Medical History:  Diagnosis Date  . CAD (coronary artery disease)    a. s/p DES to OM1 in 02/2017  . Essential hypertension   . Gout   . HOCM (hypertrophic obstructive cardiomyopathy) (HCC)   . Hyperlipidemia   . Rheumatoid arthritis [facility])     Patient Active Problem List   Diagnosis Date Noted  . RUQ pain   . Acute on chronic diastolic CHF (congestive heart failure) (HCC) 01/26/2018  . Rheumatoid arthritis (HCC) 01/26/2018  . Elevated troponin 01/26/2018  . Acute lower UTI 01/26/2018  . Abdominal pain 01/26/2018  . GERD (gastroesophageal reflux disease) 01/26/2018  . Precordial chest pain   . CAD (coronary artery disease) 09/17/2017  . HOCM (hypertrophic obstructive cardiomyopathy) (HCC)   . Unstable angina (HCC)   . Coronary artery disease involving native coronary artery of native heart with unstable angina pectoris (HCC)   . Chest pain 01/29/2017  . Hypertrophic cardiomyopathy (HCC) 01/29/2017  . MR (mitral regurgitation) 01/29/2017  . HTN (hypertension) 01/29/2017  . Hyperlipidemia 01/29/2017  . Gout 01/29/2017  . Arthritis 01/29/2017  . Anemia 01/29/2017    Past Surgical History:  Procedure Laterality Date  . CORONARY BALLOON ANGIOPLASTY N/A 03/03/2017   Procedure: CORONARY BALLOON ANGIOPLASTY;  Surgeon: Marykay Lex, MD;  Location: [facility] INVASIVE CV LAB;  Service: Cardiovascular;  Laterality: N/A;  . CORONARY STENT INTERVENTION N/A 03/03/2017   Procedure: CORONARY STENT INTERVENTION;  Surgeon: Marykay Lex, MD;  Location: [facility]  INVASIVE CV LAB;  Service: Cardiovascular;  Laterality: N/A;  . LEFT HEART CATH AND CORONARY ANGIOGRAPHY N/A 03/02/2017   Procedure: LEFT HEART CATH AND CORONARY ANGIOGRAPHY;  Surgeon: Tonny Bollman, MD;  Location: [facility] INVASIVE CV LAB;  Service: Cardiovascular;  Laterality: N/A;     OB History    Gravida  7   Para      Term      Preterm      AB      Living  7     SAB      TAB      Ectopic      Multiple      Live Births               Home Medications    Prior to Admission medications   Medication Sig Start Date End Date Taking? Authorizing Provider  allopurinol (ZYLOPRIM) 300 MG tablet Take 300 mg by mouth daily as needed (for gout).    Yes [provider]  aspirin EC 81 MG tablet Take 81 mg by mouth daily.   Yes [provider]  atorvastatin (LIPITOR) 10 MG tablet Take 10 mg by mouth daily.   Yes [provider]  carvedilol (COREG) 6.25 MG tablet Take 1 tablet (6.25 mg total) by mouth 2 (two) times daily with a meal. 11/16/17  Yes Tat, David, MD  ezetimibe (ZETIA) 10 MG tablet Take 10 mg by mouth daily.   Yes [provider]  famotidine (PEPCID) 20 MG tablet Take 1 tablet (20 mg total) by mouth 2 (two) times daily. 01/24/18  Yes Gerhard Munch, MD  ferrous sulfate (FEROSUL) 325 (65 FE) MG tablet Take 325 mg by mouth daily with breakfast.   Yes [provider]  fluticasone (FLONASE) 50 MCG/ACT nasal spray Place 2 sprays into both nostrils daily.   Yes [provider]  folic acid (FOLVITE) 1 MG tablet Take 1 mg by mouth daily.   Yes [provider]  furosemide (LASIX) 40 MG tablet Take 1 tablet (40 mg total) by mouth every other day. 01/29/18  Yes Johnson, Clanford L, MD  methotrexate 2.5 MG tablet Take 15 mg by mouth once a week. On Thursday   Yes [provider]  nitroGLYCERIN (NITROSTAT) 0.4 MG SL tablet Place 1 tablet (0.4 mg total) under the tongue every 5 (five) minutes as needed for chest pain.  03/04/17  Yes Robbie Lis M, PA-C  oxyCODONE-acetaminophen (PERCOCET) 5-325 MG tablet Take 1 tablet by mouth every 4 (four) hours as needed for moderate pain. 01/30/18  Yes Dione Booze, MD  pantoprazole (PROTONIX) 40 MG tablet Take 40 mg by mouth daily.   Yes [provider]  RESTASIS MULTIDOSE 0.05 % ophthalmic emulsion PLACE ONE DROP IN BOTH EYES TWICE DAILY 12/26/16  Yes [provider]  sucralfate (CARAFATE) 1 g tablet Take 1 tablet (1 g total) by mouth 4 (four) times daily -  with meals and at bedtime. 01/24/18  Yes Gerhard Munch, MD  verapamil (CALAN-SR) 240 MG CR tablet Take 1 tablet (240 mg total) by mouth daily. 11/17/17  Yes Tat, Onalee Hua, MD  cephALEXin (KEFLEX) 500 MG capsule Take 1 capsule (500 mg total) by mouth 4 (four) times daily. 05/26/18   Jacalyn Lefevre, MD    Family History Family History  Problem Relation Age of Onset  . Hypertension Mother   . Liver disease Mother   . Alcohol abuse Father     Social History Social History   Tobacco Use  . Smoking status: Never Smoker  . Smokeless tobacco: Never Used  Substance Use Topics  . Alcohol use: No  . Drug use: No     Allergies   Patient has no known allergies.   Review of Systems Review of Systems  Genitourinary: Positive for dysuria.  All other systems reviewed and are negative.    Physical Exam Updated Vital Signs BP (!) 138/97 (BP Location: Right Arm)   Pulse 78   Temp 98.4 F (36.9 C) (Oral)   Resp 18   Ht 5\' 4"  (1.626 m)   Wt 79.8 kg   SpO2 98%   BMI 30.21 kg/m   Physical Exam  Constitutional: She is oriented to person, place, and time. She appears well-developed and well-nourished.  HENT:  Head: Normocephalic and atraumatic.  Right Ear: External ear normal.  Left Ear: External ear normal.  Nose: Nose normal.  Mouth/Throat: Oropharynx is clear and moist.  Eyes: Pupils are equal, round, and reactive to light. Conjunctivae and EOM are normal.  Neck: Normal range of  motion. Neck supple.  Cardiovascular: Normal rate, regular rhythm, normal heart sounds and intact distal pulses.  Pulmonary/Chest: Effort normal and breath sounds normal.  Abdominal: Soft. Bowel sounds are normal.  Musculoskeletal: Normal range of motion.  Neurological: She is alert and oriented to person, place, and time.  Skin: Skin is warm. Capillary refill takes less than 2 seconds.  Psychiatric: She has a normal mood and affect. Her behavior is  normal. Judgment and thought content normal.  Nursing note and vitals reviewed.    ED Treatments / Results  Labs (all labs ordered are listed, but only abnormal results are displayed) Labs Reviewed  URINALYSIS, ROUTINE W REFLEX MICROSCOPIC - Abnormal; Notable for the following components:      Result Value   APPearance CLOUDY (*)    Hgb urine dipstick SMALL (*)    Nitrite POSITIVE (*)    Leukocytes, UA LARGE (*)    WBC, UA >50 (*)    Bacteria, UA MANY (*)    All other components within normal limits    EKG None  Radiology No results found.  Procedures Procedures (including critical care time)  Medications Ordered in ED Medications  cephALEXin (KEFLEX) capsule 500 mg (has no administration in time range)     Initial Impression / Assessment and Plan / ED Course  I have reviewed the triage vital signs and the nursing notes.  Pertinent labs & imaging results that were available during my care of the patient were reviewed by me and considered in my medical decision making (see chart for details).    Pt does have a UTI.  She will be given her first dose of keflex here and d/c home with a rx.  She knows to return if worse.  F/u with pcp.  Final Clinical Impressions(s) / ED Diagnoses   Final diagnoses:  Acute cystitis without hematuria    ED Discharge Orders         Ordered    cephALEXin (KEFLEX) 500 MG capsule  4 times daily     05/26/18 1813           Jacalyn Lefevre, MD 05/26/18 1813

## 2018-05-26 NOTE — ED Triage Notes (Signed)
Patient reports symptoms of UTI that started 2 days ago.

## 2018-06-27 ENCOUNTER — Other Ambulatory Visit: Payer: Self-pay

## 2018-06-27 ENCOUNTER — Emergency Department (HOSPITAL_COMMUNITY): Payer: Medicare Other

## 2018-06-27 ENCOUNTER — Emergency Department (HOSPITAL_COMMUNITY)
Admission: EM | Admit: 2018-06-27 | Discharge: 2018-06-27 | Disposition: A | Discharge status: Home / Self Care | Payer: Medicare Other | Attending: Emergency Medicine | Admitting: Emergency Medicine

## 2018-06-27 DIAGNOSIS — I5032 Chronic diastolic (congestive) heart failure: Secondary | ICD-10-CM | POA: Insufficient documentation

## 2018-06-27 DIAGNOSIS — I2510 Atherosclerotic heart disease of native coronary artery without angina pectoris: Secondary | ICD-10-CM | POA: Insufficient documentation

## 2018-06-27 DIAGNOSIS — Z7982 Long term (current) use of aspirin: Secondary | ICD-10-CM | POA: Diagnosis not present

## 2018-06-27 DIAGNOSIS — K859 Acute pancreatitis without necrosis or infection, unspecified: Secondary | ICD-10-CM | POA: Insufficient documentation

## 2018-06-27 DIAGNOSIS — Z79899 Other long term (current) drug therapy: Secondary | ICD-10-CM | POA: Diagnosis not present

## 2018-06-27 DIAGNOSIS — I110 Hypertensive heart disease with heart failure: Secondary | ICD-10-CM | POA: Insufficient documentation

## 2018-06-27 DIAGNOSIS — R1084 Generalized abdominal pain: Secondary | ICD-10-CM | POA: Diagnosis present

## 2018-06-27 LAB — COMPREHENSIVE METABOLIC PANEL
ALBUMIN: 3.5 g/dL (ref 3.5–5.0)
ALK PHOS: 93 U/L (ref 38–126)
ALT: 74 U/L — AB (ref 0–44)
AST: 162 U/L — AB (ref 15–41)
Anion gap: 4 — ABNORMAL LOW (ref 5–15)
BILIRUBIN TOTAL: 0.7 mg/dL (ref 0.3–1.2)
BUN: 16 mg/dL (ref 8–23)
CO2: 27 mmol/L (ref 22–32)
CREATININE: 0.8 mg/dL (ref 0.44–1.00)
Calcium: 8.7 mg/dL — ABNORMAL LOW (ref 8.9–10.3)
Chloride: 109 mmol/L (ref 98–111)
GFR calc Af Amer: >60 mL/min (ref >60)
GFR calc non Af Amer: >60 mL/min (ref >60)
GLUCOSE: 102 mg/dL — AB (ref 70–99)
POTASSIUM: 4.2 mmol/L (ref 3.5–5.1)
Sodium: 140 mmol/L (ref 135–145)
TOTAL PROTEIN: 6.8 g/dL (ref 6.5–8.1)

## 2018-06-27 LAB — CBC WITH DIFFERENTIAL/PLATELET
ABS IMMATURE GRANULOCYTES: 0.01 10*3/uL (ref 0.00–0.07)
BASOS PCT: 0 %
Basophils Absolute: 0 10*3/uL (ref 0.0–0.1)
EOS PCT: 2 %
Eosinophils Absolute: 0.1 10*3/uL (ref 0.0–0.5)
HCT: 38.6 % (ref 36.0–46.0)
HEMOGLOBIN: 12.2 g/dL (ref 12.0–15.0)
Immature Granulocytes: 0 %
Lymphocytes Relative: 26 %
Lymphs Abs: 1 10*3/uL (ref 0.7–4.0)
MCH: 29.8 pg (ref 26.0–34.0)
MCHC: 31.6 g/dL (ref 30.0–36.0)
MCV: 94.4 fL (ref 80.0–100.0)
MONOS PCT: 9 %
Monocytes Absolute: 0.3 10*3/uL (ref 0.1–1.0)
NEUTROS ABS: 2.3 10*3/uL (ref 1.7–7.7)
Neutrophils Relative %: 63 %
PLATELETS: 192 10*3/uL (ref 150–400)
RBC: 4.09 MIL/uL (ref 3.87–5.11)
RDW: 13.9 % (ref 11.5–15.5)
WBC: 3.7 10*3/uL — ABNORMAL LOW (ref 4.0–10.5)
nRBC: 0 % (ref 0.0–0.2)

## 2018-06-27 LAB — TROPONIN I: Troponin I: <0.03 ng/mL (ref <0.03)

## 2018-06-27 LAB — LIPASE, BLOOD: Lipase: 2395 U/L — ABNORMAL HIGH (ref 11–51)

## 2018-06-27 MED ORDER — OXYCODONE-ACETAMINOPHEN 5-325 MG PO TABS
1.0000 | ORAL_TABLET | Freq: Once | ORAL | Status: AC
  Administered 2018-06-27: 1 via ORAL
  Filled 2018-06-27: qty 1

## 2018-06-27 MED ORDER — IOPAMIDOL (ISOVUE-300) INJECTION 61%
100.0000 mL | Freq: Once | INTRAVENOUS | Status: AC | PRN
  Administered 2018-06-27: 100 mL via INTRAVENOUS

## 2018-06-27 MED ORDER — OXYCODONE-ACETAMINOPHEN 5-325 MG PO TABS: 1.0000 | ORAL_TABLET | ORAL | 0 refills | Status: DC | PRN

## 2018-06-27 NOTE — ED Triage Notes (Signed)
Pt coming in needing refills on oxy,  Denies any other symptoms, Dr Lovell Sheehan saw pt and put off surgery until Cardiac consult, Pt saw Dr Diona Browner. Pt continues to have abdominal pain and in need of medication.

## 2018-06-27 NOTE — Discharge Instructions (Addendum)
You were seen in the emergency department for a recurrence of your upper abdominal pain.  You had lab work and a CAT scan that showed the pancreas was inflamed.  This is possibly related to a stone that is blocking 1 of the ducts.  Your pain was improved and the surgeons felt he should be able to go home but want you to follow-up next Tuesday with Dr. Lovell Sheehan.  If you experience worsening pain fever vomiting or other concerns please return to the hospital.

## 2018-06-27 NOTE — ED Provider Notes (Signed)
[facility] EMERGENCY DEPARTMENT Provider Note   CSN: [id number] Arrival date & time: 06/27/18  1146     History   Chief Complaint Chief Complaint  Patient presents with  . Abdominal Pain    HPI Dana [name] is a 78 y.o. female.  She is primarily Samoan speaking and her son is translating for her.  He brings her in today for some subxiphoid abdominal pain.  It sounds like she has had this pain on and off since August.  She saw Dr. Lovell Sheehan and there was some thought that she needed a surgery.  He gave her a prescription for oxycodone to take as needed for the pain.  She was post DC cardiology for clearance for possible operation.  She saw Dr. Diona Browner and has been back to see Dr. Lovell Sheehan and since then.  Per the son there was not a plan for an operation and any told her just to use the pain medicine as needed.  They are out of Percocet.  She said her pain started last evening subxiphoid does not radiate anywhere not associate with any fevers or chills nausea or vomiting diarrhea constipation or urinary symptoms.  Not associate with shortness of breath or chest pain.  He says she gets the pain episodically and is usually related to something she eats.  The history is provided by the patient and a relative. The history is limited by a language barrier. A language interpreter was used (son at patient's request).  Abdominal Pain   This is a recurrent problem. The current episode started yesterday. The problem occurs constantly. The problem has not changed since onset.The pain is associated with eating. The pain is located in the epigastric region. The quality of the pain is pressure-like. The pain is moderate. Pertinent negatives include fever, diarrhea, nausea, vomiting, constipation, dysuria, frequency, hematuria and headaches. Nothing aggravates the symptoms. Nothing relieves the symptoms. Past workup includes ultrasound.    Past Medical History:  Diagnosis Date  . CAD (coronary artery  disease)    a. s/p DES to OM1 in 02/2017  . Essential hypertension   . Gout   . HOCM (hypertrophic obstructive cardiomyopathy) (HCC)   . Hyperlipidemia   . Rheumatoid arthritis [facility])     Patient Active Problem List   Diagnosis Date Noted  . RUQ pain   . Acute on chronic diastolic CHF (congestive heart failure) (HCC) 01/26/2018  . Rheumatoid arthritis (HCC) 01/26/2018  . Elevated troponin 01/26/2018  . Acute lower UTI 01/26/2018  . Abdominal pain 01/26/2018  . GERD (gastroesophageal reflux disease) 01/26/2018  . Precordial chest pain   . CAD (coronary artery disease) 09/17/2017  . HOCM (hypertrophic obstructive cardiomyopathy) (HCC)   . Unstable angina (HCC)   . Coronary artery disease involving native coronary artery of native heart with unstable angina pectoris (HCC)   . Chest pain 01/29/2017  . Hypertrophic cardiomyopathy (HCC) 01/29/2017  . MR (mitral regurgitation) 01/29/2017  . HTN (hypertension) 01/29/2017  . Hyperlipidemia 01/29/2017  . Gout 01/29/2017  . Arthritis 01/29/2017  . Anemia 01/29/2017    Past Surgical History:  Procedure Laterality Date  . CORONARY BALLOON ANGIOPLASTY N/A 03/03/2017   Procedure: CORONARY BALLOON ANGIOPLASTY;  Surgeon: Marykay Lex, MD;  Location: [facility] INVASIVE CV LAB;  Service: Cardiovascular;  Laterality: N/A;  . CORONARY STENT INTERVENTION N/A 03/03/2017   Procedure: CORONARY STENT INTERVENTION;  Surgeon: Marykay Lex, MD;  Location: [facility] INVASIVE CV LAB;  Service: Cardiovascular;  Laterality: N/A;  . LEFT HEART CATH  AND CORONARY ANGIOGRAPHY N/A 03/02/2017   Procedure: LEFT HEART CATH AND CORONARY ANGIOGRAPHY;  Surgeon: Tonny Bollmanooper, Sylas Twombly, MD;  Location[facility] INVASIVE CV LAB;  Service: Cardiovascular;  Laterality: N/A;     OB History    Gravida  7   Para      Term      Preterm      AB      Living  7     SAB      TAB      Ectopic      Multiple      Live Births               Home Medications    Prior to Admission  medications   Medication Sig Start Date End Date Taking? Authorizing Provider  allopurinol (ZYLOPRIM) 300 MG tablet Take 300 mg by mouth daily as needed (for gout).     [provider]  aspirin EC 81 MG tablet Take 81 mg by mouth daily.    [provider]  atorvastatin (LIPITOR) 10 MG tablet Take 10 mg by mouth daily.    [provider]  carvedilol (COREG) 6.25 MG tablet Take 1 tablet (6.25 mg total) by mouth 2 (two) times daily with a meal. 11/16/17   Tat, Onalee Huaavid, MD  cephALEXin (KEFLEX) 500 MG capsule Take 1 capsule (500 mg total) by mouth 4 (four) times daily. 05/26/18   Jacalyn LefevreHaviland, Julie, MD  ezetimibe (ZETIA) 10 MG tablet Take 10 mg by mouth daily.    [provider]  famotidine (PEPCID) 20 MG tablet Take 1 tablet (20 mg total) by mouth 2 (two) times daily. 01/24/18   Gerhard MunchLockwood, Robert, MD  ferrous sulfate (FEROSUL) 325 (65 FE) MG tablet Take 325 mg by mouth daily with breakfast.    [provider]  fluticasone (FLONASE) 50 MCG/ACT nasal spray Place 2 sprays into both nostrils daily.    [provider]  folic acid (FOLVITE) 1 MG tablet Take 1 mg by mouth daily.    [provider]  furosemide (LASIX) 40 MG tablet Take 1 tablet (40 mg total) by mouth every other day. 01/29/18   Johnson, Clanford L, MD  methotrexate 2.5 MG tablet Take 15 mg by mouth once a week. On Thursday    [provider]  nitroGLYCERIN (NITROSTAT) 0.4 MG SL tablet Place 1 tablet (0.4 mg total) under the tongue every 5 (five) minutes as needed for chest pain. 03/04/17   Allayne ButcherSimmons, Brittainy M, PA-C  oxyCODONE-acetaminophen (PERCOCET) 5-325 MG tablet Take 1 tablet by mouth every 4 (four) hours as needed for moderate pain. 01/30/18   Dione BoozeGlick, David, MD  pantoprazole (PROTONIX) 40 MG tablet Take 40 mg by mouth daily.    [provider]  RESTASIS MULTIDOSE 0.05 % ophthalmic emulsion PLACE ONE DROP IN BOTH EYES TWICE DAILY 12/26/16   [provider]    sucralfate (CARAFATE) 1 g tablet Take 1 tablet (1 g total) by mouth 4 (four) times daily -  with meals and at bedtime. 01/24/18   Gerhard MunchLockwood, Robert, MD  verapamil (CALAN-SR) 240 MG CR tablet Take 1 tablet (240 mg total) by mouth daily. 11/17/17   Catarina Hartshornat, David, MD    Family History Family History  Problem Relation Age of Onset  . Hypertension Mother   . Liver disease Mother   . Alcohol abuse Father     Social History Social History   Tobacco Use  . Smoking status: Never Smoker  . Smokeless tobacco: Never  Used  Substance Use Topics  . Alcohol use: No  . Drug use: No     Allergies   Patient has no known allergies.   Review of Systems Review of Systems  Constitutional: Negative for fever.  HENT: Negative for sore throat.   Eyes: Negative for visual disturbance.  Respiratory: Negative for shortness of breath.   Cardiovascular: Negative for chest pain.  Gastrointestinal: Positive for abdominal pain. Negative for constipation, diarrhea, nausea and vomiting.  Genitourinary: Negative for dysuria, frequency and hematuria.  Musculoskeletal: Negative for neck pain.  Skin: Negative for rash.  Neurological: Negative for headaches.     Physical Exam Updated Vital Signs BP 111/60 (BP Location: Right Arm)   Pulse 67   Temp 98.2 F (36.8 C) (Oral)   Resp 18   Ht 5\' 2"  (1.575 m)   Wt 78.9 kg   SpO2 97%   BMI 31.83 kg/m   Physical Exam Vitals signs and nursing note reviewed.  Constitutional:      General: She is not in acute distress.    Appearance: She is well-developed.  HENT:     Head: Normocephalic and atraumatic.  Eyes:     Conjunctiva/sclera: Conjunctivae normal.  Neck:     Musculoskeletal: Neck supple.  Cardiovascular:     Rate and Rhythm: Normal rate and regular rhythm.     Heart sounds: No murmur.  Pulmonary:     Effort: Pulmonary effort is normal. No respiratory distress.     Breath sounds: Normal breath sounds.  Abdominal:     Palpations: Abdomen is soft.      Tenderness: There is no abdominal tenderness. There is no guarding or rebound.  Musculoskeletal: Normal range of motion.        General: No tenderness or signs of injury.  Skin:    General: Skin is warm and dry.     Capillary Refill: Capillary refill takes less than 2 seconds.  Neurological:     General: No focal deficit present.     Mental Status: She is alert and oriented to person, place, and time.     Motor: No weakness.      ED Treatments / Results  Labs (all labs ordered are listed, but only abnormal results are displayed) Labs Reviewed  LIPASE, BLOOD - Abnormal; Notable for the following components:      Result Value   Lipase 2,395 (*)    All other components within normal limits  COMPREHENSIVE METABOLIC PANEL - Abnormal; Notable for the following components:   Glucose, Bld 102 (*)    Calcium 8.7 (*)    AST 162 (*)    ALT 74 (*)    Anion gap 4 (*)    All other components within normal limits  CBC WITH DIFFERENTIAL/PLATELET - Abnormal; Notable for the following components:   WBC 3.7 (*)    All other components within normal limits  TROPONIN I    EKG EKG Interpretation  Date/Time:  Wednesday June 27 2018 12:20:56 EST Ventricular Rate:  78 PR Interval:    QRS Duration: 97 QT Interval:  412 QTC Calculation: 470 R Axis:   12 Text Interpretation:  Sinus rhythm LVH with secondary repolarization abnormality Anterior Q waves, possibly due to LVH similar to prior 8/19 Confirmed by Meridee Score [phone]) on 06/27/2018 12:27:12 PM   Radiology Ct Abdomen Pelvis W Contrast  Result Date: 06/27/2018 CLINICAL DATA:  78 year old female with mid abdominal pain. EXAM: CT ABDOMEN AND PELVIS WITH CONTRAST TECHNIQUE: Multidetector CT imaging  of the abdomen and pelvis was performed using the standard protocol following bolus administration of intravenous contrast. CONTRAST:  100mL ISOVUE-300 IOPAMIDOL (ISOVUE-300) INJECTION 61% COMPARISON:  No priors. FINDINGS: Lower chest:  Mild cardiomegaly. Calcifications of the mitral annulus. Calcifications of the aortic valve. Atherosclerotic calcifications in the thoracic aorta as well as the left main, left anterior descending, left circumflex and right coronary arteries. Hepatobiliary: Subcentimeter low-attenuation lesions in the liver, largest of which is in segment 7 of the liver, too small to characterize, but statistically likely to represent cysts. No other definite suspicious appearing hepatic lesions. Mild intrahepatic biliary ductal dilatation. Common bile duct measures 1 cm in the porta hepatis. Gallbladder is unremarkable in appearance. Pancreas: No pancreatic mass. No pancreatic ductal dilatation. Very subtle haziness in the peripancreatic fat, concerning for mild acute pancreatitis. Spleen: Unremarkable. Adrenals/Urinary Tract: Bilateral kidneys and bilateral adrenal glands are normal in appearance. No hydroureteronephrosis. Urinary bladder is normal in appearance. Stomach/Bowel: Normal appearance of the stomach. No pathologic dilatation of small bowel or colon. The appendix is not confidently identified and may be surgically absent. Regardless, there are no inflammatory changes noted adjacent to the cecum to suggest the presence of an acute appendicitis at this time. Vascular/Lymphatic: Aortic atherosclerosis, without evidence of aneurysm or dissection in the abdominal or pelvic vasculature. No lymphadenopathy noted in the abdomen or pelvis. Reproductive: Uterus and ovaries are unremarkable in appearance. In the labia adjacent to the introitus on the left side there is a 4.8 x 3.2 x 4.9 cm intermediate attenuation lesion, likely to represent a Bartholin gland cyst. Other: No significant volume of ascites.  No pneumoperitoneum. Musculoskeletal: There are no aggressive appearing lytic or blastic lesions noted in the visualized portions of the skeleton. IMPRESSION: 1. Subtle inflammatory changes in the peripancreatic fat concerning  for an acute pancreatitis. 2. Mild intra and extrahepatic biliary ductal dilatation. This may simply be related to inflammation in the head of the pancreas causing mild narrowing of the distal common bile duct. No obstructing pancreatic head mass. No choledocholithiasis. 3. No cholelithiasis. 4. Aortic atherosclerosis, in addition to left main and 3 vessel coronary artery disease. Assessment for potential risk factor modification, dietary therapy or pharmacologic therapy may be warranted, if clinically indicated. 5. Mild cardiomegaly. 6. Large cystic lesion to the left side of the vaginal introitus, likely to represent a Bartholin gland cyst. 7. Additional incidental findings, as above. Electronically Signed   By: Trudie Reedaniel  Entrikin M.D.   On: 06/27/2018 16:15    Procedures Procedures (including critical care time)  Medications Ordered in ED Medications  oxyCODONE-acetaminophen (PERCOCET/ROXICET) 5-325 MG per tablet 1 tablet (has no administration in time range)     Initial Impression / Assessment and Plan / ED Course  I have reviewed the triage vital signs and the nursing notes.  Pertinent labs & imaging results that were available during my care of the patient were reviewed by me and considered in my medical decision making (see chart for details).  Clinical Course as of Jun 28 913  Wed Jun 27, 2018  1215 Patient has a known history of gallstones and sludge from in August ultrasound.  I see 1 of the notes from Dr. Lovell SheehanJenkins and the note from cardiology.  Per the son the patient has been back to Dr. Lovell SheehanJenkins since the cardiology eval and told that she does not need an operation and then just to use pain medicine when she has her pain.  I think it would be reasonable least check some  labs and make sure that her LFTs are not rising or she does not have an elevated white count.   [MB]  1301 Last ultrasound was in August and showed multiple gallstones and some sludge with a sonographic Murphy's.    [MB]  1345 Patient received a dose of oxycodone and she feels better.  I check some screening labs and although her white count is slightly low her AST and ALT are elevated along with a significantly elevated lipase.  This was not the case on prior testing.  I talked to the patient and her son and she is agreeable to for an ultrasound.   [MB]  1504 Found out that ultrasound is not available today.  Will order her a CAT scan and updated patient and son.   [MB]  1652 I reviewed the findings with Dr. Kendell Bane from GI.  He said the most likely scenario is that she is passed a stone because her pain is resolved and she looks so well.  He does not necessarily think that she needs admission but would recommend serial LFTs to make sure that they are trending down.  He did asked that I check in with Dr. Henreitta Leber covering Dr. Lovell Sheehan to see if she had any other suggestions.   [MB]  1703 Discussed with Dr. Henreitta Leber who did feel like the patient was really pain for a and was eating and drinking that she could probably go home and follow-up with in the office on Tuesday.  Good instructions for returning if any worsening pain or other concerns.   [MB]  1708 I reviewed the plan with the son and the patient and they are comfortable going home.  They understand to follow-up with a surgeon Dr. Lovell Sheehan on Tuesday and return if any increased pain fevers vomiting or other concerns.   [MB]    Clinical Course User Index [MB] Terrilee Files, MD      Final Clinical Impressions(s) / ED Diagnoses   Final diagnoses:  Acute pancreatitis without infection or necrosis, unspecified pancreatitis type    ED Discharge Orders         Ordered    oxyCODONE-acetaminophen (PERCOCET) 5-325 MG tablet  Every 4 hours PRN     06/27/18 1710           Terrilee Files, MD 06/28/18 [phone]

## 2018-07-05 ENCOUNTER — Telehealth: Payer: Self-pay | Admitting: Cardiology

## 2018-07-05 ENCOUNTER — Ambulatory Visit (INDEPENDENT_AMBULATORY_CARE_PROVIDER_SITE_OTHER): Payer: Medicare Other | Admitting: General Surgery

## 2018-07-05 ENCOUNTER — Encounter: Payer: Self-pay | Admitting: General Surgery

## 2018-07-05 VITALS — BP 126/79 | HR 68 | Temp 97.3°F | Resp 18 | Wt 176.0 lb

## 2018-07-05 DIAGNOSIS — K8020 Calculus of gallbladder without cholecystitis without obstruction: Secondary | ICD-10-CM

## 2018-07-05 NOTE — Telephone Encounter (Signed)
Does patient need an apt prior to surgery

## 2018-07-05 NOTE — Progress Notes (Signed)
Subjective:     Dana [name]  Patient presents back to my care after having an episode of pancreatitis that was diagnosed in the emergency room.  She currently has no abdominal pain.  She has had intermittent episodes of right upper quadrant abdominal pain since I last saw her, but they have resolved without difficulty.  She currently denies any fever, chills, or jaundice. Objective:    BP 126/79 (BP Location: Left Arm, Patient Position: Sitting, Cuff Size: Normal)   Pulse 68   Temp (!) 97.3 F (36.3 C) (Temporal)   Resp 18   Wt 176 lb (79.8 kg)   BMI 32.19 kg/m   General:  alert, cooperative and no distress  Well-developed and well-nourished Hispanic female in no acute distress Head is normocephalic, atraumatic Lungs clear to auscultation with equal breath sounds bilaterally Heart examination reveals a regular rate and rhythm without S3, S4, murmurs Abdomen soft, nontender, nondistended.  No hepatosplenomegaly, masses, hernias are identified.  Previous ER notes reviewed.  Labs reviewed.     Assessment:    Documented episode of acute pancreatitis, most likely secondary to gallstones.    Plan:   Given her recent attack of pancreatitis, we now have to strongly consider cholecystectomy in order to prevent further episodes.  The risks and benefits of the procedure including bleeding, infection, cardiopulmonary difficulties, hepatobiliary injury, and the possibility of an open procedure were fully explained to the patient, gave informed consent.  Patient will need preoperative clearance by cardiology.  Patient will call and schedule the surgery once this is done.  Should she have any further episodes of abdominal pain, she was instructed to go the emergency room.

## 2018-07-05 NOTE — Patient Instructions (Signed)
Colecistectoma laparoscpica Laparoscopic Cholecystectomy Una colecistectoma laparoscpica es un procedimiento que se realiza para extirpar la vescula biliar. La vescula biliar es un rgano que tiene forma de pera y se encuentra debajo del hgado, del lado derecho del cuerpo. La vescula biliar almacena bilis, un lquido que ayuda a Nash-Finch Companydigerir las grasas. La colecistectoma se realiza con frecuencia debido a la inflamacin de la vescula biliar (colecistitis). Esta afeccin normalmente se debe a la acumulacin de clculos biliares (colelitiasis) en la vescula biliar. Estos clculos pueden obstruir el flujo de la bilis, lo que produce inflamacin y Engineer, miningdolor. En los [place] Tool Workscasos graves, podr ser Bangladeshnecesaria una ciruga de urgencia. Este procedimiento se realiza a travs de incisiones pequeas en el abdomen (ciruga laparoscpica). Se introduce un endoscopio delgado que tiene Secretary/administratoruna cmara (laparoscopio) a travs de una incisin. A travs de las otras incisiones, se introducen pequeos instrumentos quirrgicos. En algunos casos, un procedimiento de Azerbaijanciruga laparoscpica puede convertirse en un tipo de ciruga que se realiza a travs de una incisin ms grande [place](ciruga abierta). Informe al mdico acerca de lo siguiente:  Cualquier alergia que tenga.  Todos los Walt Disneymedicamentos que utiliza, incluidos vitaminas, hierbas, gotas oftlmicas, cremas y [address] Stmedicamentos de [address] Northventa libre.  Cualquier problema que usted o sus familiares hayan tenido con anestsicos.  Cualquier enfermedad de la sangre que tenga.  Cirugas a las que se someti.  Cualquier afeccin mdica que tenga.  Si est embarazada o podra estarlo. Cules son los riesgos? En general, se trata de un procedimiento seguro. Sin embargo, pueden ocurrir complicaciones, por ejemplo:  Infeccin.  Hemorragia.  Reacciones alrgicas a los medicamentos.  Daos a Systems developerotras estructuras u otros rganos.  Un clculo que queda en el conducto biliar comn (coldoco). El  conducto coldoco transporta la bilis desde la vescula biliar hacia el intestino delgado.  Una filtracin de bilis del conducto cstico que se comprime cuando se extirpa la vescula biliar. Medicamentos  Consulte al mdico sobre: ? Multimedia programmerCambiar o suspender los medicamentos que toma habitualmente. Esto es muy importante si toma medicamentos para la diabetes o anticoagulantes. ? Tomar medicamentos como aspirina e ibuprofeno. Estos medicamentos pueden tener un efecto anticoagulante en la [place]angre. No tome estos medicamentos antes del procedimiento si su mdico le indica que no lo haga.  Pueden indicarle un antibitico para ayudar a prevenir infecciones. Instrucciones generales  Infrmele al mdico antes de la ciruga si se ha resfriado o si tiene una infeccin.  Haga que alguien lo lleve a su casa desde el hospital o la clnica.  Pregntele al mdico cmo se Cabin crewmarcar o se Audiological scientistidentificar el lugar de la Leisure centre managerciruga. Qu ocurre durante el procedimiento?   Para disminuir el riesgo de contraer una infeccin: ? El equipo mdico se lavar o se Administrator, sportsdesinfectar las manos. ? Le lavarn la piel con jabn. ? Pueden rasurarle la zona Barbadosquirrgica.  Pueden colocarle un tubo (catter) intravenoso en una de las venas.  Le administrarn uno o ms de los siguientes medicamentos: ? Un medicamento para ayudarlo a relajarse (sedante). ? Un medicamento que lo har dormir (anestesia general).  Le colocarn un tubo en la boca para que pueda respirar.  Su cirujano le har varios cortes pequeos (incisiones) en el abdomen.  El laparoscopio se introducir a travs de una de las pequeas incisiones. La cmara del laparoscopio enviar imgenes a una pantalla de televisin (monitor) que se encuentra en el quirfano. Esto permitir a su Pensions consultantcirujano ver dentro del abdomen.  Le inyectarn un gas similar al aire en el abdomen. Esto expandir  el abdomen para que el cirujano tenga ms lugar para Facilities managerhacer la ciruga.  El resto del  instrumental necesario para el procedimiento se introducir a travs de las otras incisiones. Se extirpar la vescula biliar a travs de una de las incisiones.  Se puede examinar el conducto coldoco. Si se encuentran clculos en el conducto coldoco, tal vez deban extirparse.  Despus de la extirpacin de la vescula biliar, se cerrarn las incisiones con puntos (suturas), grapas o goma para cerrar la piel.  Las incisiones pueden cubrirse con una venda (vendaje). Este procedimiento puede variar segn el mdico y el hospital. Ladell HeadsQu ocurre despus del procedimiento?  Le controlarn la presin arterial, la frecuencia cardaca, la frecuencia respiratoria y Air cabin crewel nivel de oxgeno en la sangre hasta que desaparezca el efecto de los medicamentos administrados.  Le darn analgsicos para Human resources officercontrolar el dolor, si es necesario.  No conduzca durante 24horas si le administraron un sedante. Esta informacin no tiene Theme park managercomo fin reemplazar el consejo del mdico. Asegrese de hacerle al mdico cualquier pregunta que tenga. Document Released: 06/13/2005 Document Revised: 07/07/2017 Document Reviewed: 11/30/2015 Elsevier Interactive Patient Education  2019 [employer]

## 2018-07-05 NOTE — Telephone Encounter (Signed)
Dr. Lovell Sheehan is needing surgical clearance for the pt to have Gallbladder removed, does pt need to be seen in the office?  Please give pt's son a call [phone]

## 2018-07-05 NOTE — Telephone Encounter (Signed)
I left message for son that she did not need office visit and that I will call Dr.Jenkin's office for clearance form [phone]    I spoke with Dr.Jenkins. The note by Dr.McDowell today is sufficient for his purposes in regards to anesthesia .    I will forward this note to him.

## 2018-07-05 NOTE — Telephone Encounter (Signed)
She was clinically stable at her visit in November 2019.  As long as there have been no major changes from a cardiac perspective, I would anticipate that she should be able to proceed.  Please obtain an actual clearance form from Dr. Lovell Sheehan for completion.

## 2018-07-15 ENCOUNTER — Emergency Department (HOSPITAL_COMMUNITY)
Admission: EM | Admit: 2018-07-15 | Discharge: 2018-07-15 | Disposition: A | Discharge status: Home / Self Care | Payer: Medicare Other | Attending: Emergency Medicine | Admitting: Emergency Medicine

## 2018-07-15 ENCOUNTER — Encounter (HOSPITAL_COMMUNITY): Payer: Self-pay | Admitting: Emergency Medicine

## 2018-07-15 ENCOUNTER — Other Ambulatory Visit: Payer: Self-pay

## 2018-07-15 DIAGNOSIS — I2510 Atherosclerotic heart disease of native coronary artery without angina pectoris: Secondary | ICD-10-CM | POA: Diagnosis not present

## 2018-07-15 DIAGNOSIS — R300 Dysuria: Secondary | ICD-10-CM | POA: Diagnosis present

## 2018-07-15 DIAGNOSIS — N390 Urinary tract infection, site not specified: Secondary | ICD-10-CM | POA: Insufficient documentation

## 2018-07-15 DIAGNOSIS — I10 Essential (primary) hypertension: Secondary | ICD-10-CM | POA: Diagnosis not present

## 2018-07-15 DIAGNOSIS — Z79899 Other long term (current) drug therapy: Secondary | ICD-10-CM | POA: Diagnosis not present

## 2018-07-15 LAB — URINALYSIS, ROUTINE W REFLEX MICROSCOPIC
BILIRUBIN URINE: NEGATIVE
GLUCOSE, UA: NEGATIVE mg/dL
Ketones, ur: NEGATIVE mg/dL
NITRITE: POSITIVE — AB
PROTEIN: 100 mg/dL — AB
Specific Gravity, Urine: 1.013 (ref 1.005–1.030)
WBC, UA: >50 WBC/hpf — ABNORMAL HIGH (ref 0–5)
pH: 7 (ref 5.0–8.0)

## 2018-07-15 MED ORDER — CEPHALEXIN 500 MG PO CAPS: 500.0000 mg | ORAL_CAPSULE | Freq: Two times a day (BID) | ORAL | 0 refills | Status: AC

## 2018-07-15 MED ORDER — ONDANSETRON 4 MG PO TBDP: 4.0000 mg | ORAL_TABLET | Freq: Three times a day (TID) | ORAL | 0 refills | Status: DC | PRN

## 2018-07-15 NOTE — ED Triage Notes (Signed)
Per pt family, pt has had dysuria and nausea since last night. Pt family also reports pt is to have abdominal hernia repair but reports has not been scheduled at this time.

## 2018-07-15 NOTE — Discharge Instructions (Signed)
Your sample shows a urinary tract infection.  Please take the medications exactly as prescribed including cephalexin, 500 mg by mouth twice a day for the next 5 days.  A culture has been ordered and if this shows that you need a different medication one will be called to the pharmacy for you and you will be contacted.  Thank you for letting us take care of you today!  Please obtain all of your results from medical records or have your doctors office obtain the results - share them with your doctor - you should be seen at your doctors office in the next 2 days. Call today to arrange your follow up. Take the medications as prescribed. Please review all of the medicines and only take them if you do not have an allergy to them. Please be aware that if you are taking birth control pills, taking other prescriptions, ESPECIALLY ANTIBIOTICS may make the birth control ineffective - if this is the case, either do not engage in sexual activity or use alternative methods of birth control such as condoms until you have finished the medicine and your family doctor says it is OK to restart them. If you are on a blood thinner such as COUMADIN, be aware that any other medicine that you take may cause the coumadin to either work too much, or not enough - you should have your coumadin level rechecked in next 7 days if this is the case.  ?  It is also a possibility that you have an allergic reaction to any of the medicines that you have been prescribed - Everybody reacts differently to medications and while MOST people have no trouble with most medicines, you may have a reaction such as nausea, vomiting, rash, swelling, shortness of breath. If this is the case, please stop taking the medicine immediately and contact your physician.   If you were given a medication in the ED such as percocet, vicodin, or morphine, be aware that these medicines are sedating and may change your ability to take care of yourself adequately for  several hours after being given this medicines - you should not drive or take care of small children if you were given this medicine in the Emergency Department or if you have been prescribed these types of medicines. ?   You should return to the ER IMMEDIATELY if you develop severe or worsening symptoms.

## 2018-07-15 NOTE — ED Provider Notes (Signed)
[facility] EMERGENCY DEPARTMENT Provider Note   CSN: [id number] Arrival date & time: 07/15/18  1034     History   Chief Complaint Chief Complaint  Patient presents with  . Dysuria    HPI Dana [name] is a 78 y.o. female.  HPI  78 year old female, she has a known history of coronary disease with a stent in 2018, history of hypertension gout and is known to have a cardiomyopathy as well as a history of rheumatoid arthritis.  She is known to have a heart murmur and is scheduled to have a cholecystectomy electively after she has cardiac clearance.  She reports with her son who is the primary historian is the patient speaks only Samoan, he translates for her and states that she has had some dysuria since last night with some associated nausea but denies any abdominal pain back pain fevers chills vomiting or diarrhea.  Symptoms are persistent, they occur with each urinary event, this is similar to what she had a year ago when she had a urinary tract infection.  Past Medical History:  Diagnosis Date  . CAD (coronary artery disease)    a. s/p DES to OM1 in 02/2017  . Essential hypertension   . Gout   . HOCM (hypertrophic obstructive cardiomyopathy) (HCC)   . Hyperlipidemia   . Rheumatoid arthritis [facility])     Patient Active Problem List   Diagnosis Date Noted  . RUQ pain   . Acute on chronic diastolic CHF (congestive heart failure) (HCC) 01/26/2018  . Rheumatoid arthritis (HCC) 01/26/2018  . Elevated troponin 01/26/2018  . Acute lower UTI 01/26/2018  . Abdominal pain 01/26/2018  . GERD (gastroesophageal reflux disease) 01/26/2018  . Precordial chest pain   . CAD (coronary artery disease) 09/17/2017  . HOCM (hypertrophic obstructive cardiomyopathy) (HCC)   . Unstable angina (HCC)   . Coronary artery disease involving native coronary artery of native heart with unstable angina pectoris (HCC)   . Chest pain 01/29/2017  . Hypertrophic cardiomyopathy (HCC) 01/29/2017  . MR  (mitral regurgitation) 01/29/2017  . HTN (hypertension) 01/29/2017  . Hyperlipidemia 01/29/2017  . Gout 01/29/2017  . Arthritis 01/29/2017  . Anemia 01/29/2017    Past Surgical History:  Procedure Laterality Date  . CORONARY BALLOON ANGIOPLASTY N/A 03/03/2017   Procedure: CORONARY BALLOON ANGIOPLASTY;  Surgeon: Marykay Lex, MD;  Location: [facility] INVASIVE CV LAB;  Service: Cardiovascular;  Laterality: N/A;  . CORONARY STENT INTERVENTION N/A 03/03/2017   Procedure: CORONARY STENT INTERVENTION;  Surgeon: Marykay Lex, MD;  Location[facility] INVASIVE CV LAB;  Service: Cardiovascular;  Laterality: N/A;  . LEFT HEART CATH AND CORONARY ANGIOGRAPHY N/A 03/02/2017   Procedure: LEFT HEART CATH AND CORONARY ANGIOGRAPHY;  Surgeon: Tonny Bollman, MD;  Location: [facility] INVASIVE CV LAB;  Service: Cardiovascular;  Laterality: N/A;     OB History    Gravida  7   Para      Term      Preterm      AB      Living  7     SAB      TAB      Ectopic      Multiple      Live Births               Home Medications    Prior to Admission medications   Medication Sig Start Date End Date Taking? Authorizing Provider  allopurinol (ZYLOPRIM) 300 MG tablet Take 300 mg by mouth daily as needed (for  gout).     [provider]  aspirin EC 81 MG tablet Take 81 mg by mouth daily.    [provider]  atorvastatin (LIPITOR) 10 MG tablet Take 10 mg by mouth daily.    [provider]  carvedilol (COREG) 6.25 MG tablet Take 1 tablet (6.25 mg total) by mouth 2 (two) times daily with a meal. 11/16/17   Tat, Onalee Huaavid, MD  cephALEXin (KEFLEX) 500 MG capsule Take 1 capsule (500 mg total) by mouth 2 (two) times daily for 5 days. 07/15/18 07/20/18  Eber HongMiller, Nikala Walsworth, MD  ezetimibe (ZETIA) 10 MG tablet Take 10 mg by mouth daily.    [provider]  famotidine (PEPCID) 20 MG tablet Take 1 tablet (20 mg total) by mouth 2 (two) times daily. 01/24/18   Gerhard MunchLockwood, Robert, MD  ferrous sulfate  (FEROSUL) 325 (65 FE) MG tablet Take 325 mg by mouth daily with breakfast.    [provider]  fluticasone (FLONASE) 50 MCG/ACT nasal spray Place 2 sprays into both nostrils daily.    [provider]  folic acid (FOLVITE) 1 MG tablet Take 1 mg by mouth daily.    [provider]  furosemide (LASIX) 40 MG tablet Take 1 tablet (40 mg total) by mouth every other day. 01/29/18   Johnson, Clanford L, MD  methotrexate 2.5 MG tablet Take 15 mg by mouth once a week. On Thursday    [provider]  nitroGLYCERIN (NITROSTAT) 0.4 MG SL tablet Place 1 tablet (0.4 mg total) under the tongue every 5 (five) minutes as needed for chest pain. 03/04/17   Robbie LisSimmons, Brittainy M, PA-C  pantoprazole (PROTONIX) 40 MG tablet Take 40 mg by mouth daily.    [provider]  RESTASIS MULTIDOSE 0.05 % ophthalmic emulsion PLACE ONE DROP IN BOTH EYES TWICE DAILY 12/26/16   [provider]  sucralfate (CARAFATE) 1 g tablet Take 1 tablet (1 g total) by mouth 4 (four) times daily -  with meals and at bedtime. 01/24/18   Gerhard MunchLockwood, Robert, MD  verapamil (CALAN-SR) 240 MG CR tablet Take 1 tablet (240 mg total) by mouth daily. 11/17/17   Catarina Hartshornat, David, MD    Family History Family History  Problem Relation Age of Onset  . Hypertension Mother   . Liver disease Mother   . Alcohol abuse Father     Social History Social History   Tobacco Use  . Smoking status: Never Smoker  . Smokeless tobacco: Never Used  Substance Use Topics  . Alcohol use: No  . Drug use: No     Allergies   Patient has no known allergies.   Review of Systems Review of Systems  All other systems reviewed and are negative.    Physical Exam Updated Vital Signs BP (!) 159/81 (BP Location: Right Arm)   Pulse 77   Temp 99.1 F (37.3 C) (Oral)   Resp 18   Ht 1.575 m (5\' 2" )   Wt 79 kg   SpO2 94%   BMI 31.85 kg/m   Physical Exam Vitals signs and nursing note reviewed.  Constitutional:      General:  She is not in acute distress.    Appearance: She is well-developed.  HENT:     Head: Normocephalic and atraumatic.     Mouth/Throat:     Pharynx: No oropharyngeal exudate.  Eyes:     General: No scleral icterus.       Right eye: No discharge.  Left eye: No discharge.     Conjunctiva/sclera: Conjunctivae normal.     Pupils: Pupils are equal, round, and reactive to light.  Neck:     Musculoskeletal: Normal range of motion and neck supple.     Thyroid: No thyromegaly.     Vascular: No JVD.  Cardiovascular:     Rate and Rhythm: Normal rate and regular rhythm.     Heart sounds: Murmur (Systolic) present. No friction rub. No gallop.   Pulmonary:     Effort: Pulmonary effort is normal. No respiratory distress.     Breath sounds: Normal breath sounds. No wheezing or rales.  Abdominal:     General: Bowel sounds are normal. There is no distension.     Palpations: Abdomen is soft. There is no mass.     Tenderness: There is no abdominal tenderness.     Comments: There is no abdominal tenderness to palpation whatsoever  Musculoskeletal: Normal range of motion.        General: No tenderness.  Lymphadenopathy:     Cervical: No cervical adenopathy.  Skin:    General: Skin is warm and dry.     Findings: No erythema or rash.  Neurological:     Mental Status: She is alert.     Coordination: Coordination normal.  Psychiatric:        Behavior: Behavior normal.      ED Treatments / Results  Labs (all labs ordered are listed, but only abnormal results are displayed) Labs Reviewed  URINALYSIS, ROUTINE W REFLEX MICROSCOPIC - Abnormal; Notable for the following components:      Result Value   APPearance CLOUDY (*)    Hgb urine dipstick LARGE (*)    Protein, ur 100 (*)    Nitrite POSITIVE (*)    Leukocytes, UA LARGE (*)    WBC, UA >50 (*)    Bacteria, UA FEW (*)    All other components within normal limits  URINE CULTURE    EKG None  Radiology No results  found.  Procedures Procedures (including critical care time)  Medications Ordered in ED Medications - No data to display   Initial Impression / Assessment and Plan / ED Course  I have reviewed the triage vital signs and the nursing notes.  Pertinent labs & imaging results that were available during my care of the patient were reviewed by me and considered in my medical decision making (see chart for details).  Clinical Course as of Jul 15 1341  Sun Jul 15, 2018  1341 Urinary tract infection is present, antibiotics will be started, culture has been sent.  The patient is not febrile tachycardic or hypotensive.  Stable for home treatment   [BM]    Clinical Course User Index [BM] Eber HongMiller, Jyla Hopf, MD   The patient is very well-appearing without any other symptoms other than mild nausea and dysuria, check a urinalysis with a culture, Zofran is offered, the patient otherwise appears very well and has no signs of pancreatitis or cholecystitis either by history or exam.  Final Clinical Impressions(s) / ED Diagnoses   Final diagnoses:  Lower urinary tract infectious disease    ED Discharge Orders         Ordered    cephALEXin (KEFLEX) 500 MG capsule  2 times daily     07/15/18 1342           Eber HongMiller, Kaysa Roulhac, MD 07/15/18 1343

## 2018-07-17 LAB — URINE CULTURE

## 2018-07-18 ENCOUNTER — Telehealth: Payer: Self-pay | Admitting: Emergency Medicine

## 2018-07-18 NOTE — Telephone Encounter (Signed)
Post ED Visit - Positive Culture Follow-up  Culture report reviewed by antimicrobial stewardship pharmacist:  []  Enzo Bi, Pharm.D. []  Celedonio Miyamoto, Pharm.D., BCPS AQ-ID []  Garvin Fila, Pharm.D., BCPS []  Georgina Pillion, Pharm.D., BCPS []  [place], [address].D., BCPS, AAHIVP []  Estella Husk, Pharm.D., BCPS, AAHIVP [x]  Lysle Pearl, PharmD, BCPS []  Phillips Climes, PharmD, BCPS []  Agapito Games, PharmD, BCPS []  Verlan Friends, PharmD  Positive urine culture Treated with cephalexin, organism sensitive to the same and no further patient follow-up is required at this time.  Berle Mull 07/18/2018, 11:02 AM

## 2018-08-02 NOTE — H&P (Signed)
Subjective:     Dana [name]  Patient presents back to my care after having an episode of pancreatitis that was diagnosed in the emergency room.  She currently has no abdominal pain.  She has had intermittent episodes of right upper quadrant abdominal pain since I last saw her, but they have resolved without difficulty.  She currently denies any fever, chills, or jaundice. Objective:    BP 126/79 (BP Location: Left Arm, Patient Position: Sitting, Cuff Size: Normal)   Pulse 68   Temp (!) 97.3 F (36.3 C) (Temporal)   Resp 18   Wt 176 lb (79.8 kg)   BMI 32.19 kg/m   General:  alert, cooperative and no distress  Well-developed and well-nourished Hispanic female in no acute distress Head is normocephalic, atraumatic Lungs clear to auscultation with equal breath sounds bilaterally Heart examination reveals a regular rate and rhythm without S3, S4, murmurs Abdomen soft, nontender, nondistended.  No hepatosplenomegaly, masses, hernias are identified.  Previous ER notes reviewed.  Labs reviewed.     Assessment:    Documented episode of acute pancreatitis, most likely secondary to gallstones.    Plan:   Given her recent attack of pancreatitis, we now have to strongly consider cholecystectomy in order to prevent further episodes.  The risks and benefits of the procedure including bleeding, infection, cardiopulmonary difficulties, hepatobiliary injury, and the possibility of an open procedure were fully explained to the patient, gave informed consent.  Patient has preoperative clearance by cardiology.    Should she have any further episodes of abdominal pain, she was instructed to go the emergency room.

## 2018-08-09 NOTE — Patient Instructions (Signed)
Dana [name]  08/09/2018     @PREFPERIOPPHARMACY @   Your procedure is scheduled on  08/17/2018  Report to [facility] at  615   A.M.  Call this number if you have problems the morning of surgery:  [phone]   Remember:  Do not eat or drink after midnight.                        Take these medicines the morning of surgery with A SIP OF WATER  Allopurinol, carvedilol, pepcid, zofran, protonix, verapamil.    Do not wear jewelry, make-up or nail polish.  Do not wear lotions, powders, or perfumes, or deodorant.  Do not shave 48 hours prior to surgery.  Men may shave face and neck.  Do not bring valuables to the hospital.  [facility] is not responsible for any belongings or valuables.  Contacts, dentures or bridgework may not be worn into surgery.  Leave your suitcase in the car.  After surgery it may be brought to your room.  For patients admitted to the hospital, discharge time will be determined by your treatment team.  Patients discharged the day of surgery will not be allowed to drive home.   Name and phone number of your driver:   family Special instructions:  None  Please read over the following fact sheets that you were given. Anesthesia Post-op Instructions and Care and Recovery After Surgery       Laparoscopic Cholecystectomy, Care After This sheet gives you information about how to care for yourself after your procedure. Your health care provider may also give you more specific instructions. If you have problems or questions, contact your health care provider. What can I expect after the procedure? After the procedure, it is common to have:  Pain at your incision sites. You will be given medicines to control this pain.  Mild nausea or vomiting.  Bloating and possible shoulder pain from the air-like gas that was used during the procedure. Follow these instructions at home: Incision care   Follow instructions from your health care provider  about how to take care of your incisions. Make sure you: ? Wash your hands with soap and water before you change your bandage (dressing). If soap and water are not available, use hand sanitizer. ? Change your dressing as told by your health care provider. ? Leave stitches (sutures), skin glue, or adhesive strips in place. These skin closures may need to be in place for 2 weeks or longer. If adhesive strip edges start to loosen and curl up, you may trim the loose edges. Do not remove adhesive strips completely unless your health care provider tells you to do that.  Do not take baths, swim, or use a hot tub until your health care provider approves. Ask your health care provider if you can take showers. You may only be allowed to take sponge baths for bathing.  Check your incision area every day for signs of infection. Check for: ? More redness, swelling, or pain. ? More fluid or blood. ? Warmth. ? Pus or a bad smell. Activity  Do not drive or use heavy machinery while taking prescription pain medicine.  Do not lift anything that is heavier than 10 lb (4.5 kg) until your health care provider approves.  Do not play contact sports until your health care provider approves.  Do not drive for 24 hours if you were given  a medicine to help you relax (sedative).  Rest as needed. Do not return to work or school until your health care provider approves. General instructions  Take over-the-counter and prescription medicines only as told by your health care provider.  To prevent or treat constipation while you are taking prescription pain medicine, your health care provider may recommend that you: ? Drink enough fluid to keep your urine clear or pale yellow. ? Take over-the-counter or prescription medicines. ? Eat foods that are high in fiber, such as fresh fruits and vegetables, whole grains, and beans. ? Limit foods that are high in fat and processed sugars, such as fried and sweet foods. Contact  a health care provider if:  You develop a rash.  You have more redness, swelling, or pain around your incisions.  You have more fluid or blood coming from your incisions.  Your incisions feel warm to the touch.  You have pus or a bad smell coming from your incisions.  You have a fever.  One or more of your incisions breaks open. Get help right away if:  You have trouble breathing.  You have chest pain.  You have increasing pain in your shoulders.  You faint or feel dizzy when you stand.  You have severe pain in your abdomen.  You have nausea or vomiting that lasts for more than one day.  You have leg pain. This information is not intended to replace advice given to you by your health care provider. Make sure you discuss any questions you have with your health care provider. Document Released: 06/13/2005 Document Revised: 01/02/2016 Document Reviewed: 11/30/2015 Elsevier Interactive Patient Education  2019 [employer] Inc[employer]  Laparoscopic Cholecystectomy Laparoscopic cholecystectomy is surgery to remove the gallbladder. The gallbladder is a pear-shaped organ that lies beneath the liver on the right side of the body. The gallbladder stores bile, which is a fluid that helps the body to digest fats. Cholecystectomy is often done for inflammation of the gallbladder (cholecystitis). This condition is usually caused by a buildup of gallstones (cholelithiasis) in the gallbladder. Gallstones can block the flow of bile, which can result in inflammation and pain. In severe cases, emergency surgery may be required. This procedure is done though small incisions in your abdomen (laparoscopic surgery). A thin scope with a camera (laparoscope) is inserted through one incision. Thin surgical instruments are inserted through the other incisions. In some cases, a laparoscopic procedure may be turned into a type of surgery that is done through a larger incision (open surgery). Tell a health care  provider about:  Any allergies you have.  All medicines you are taking, including vitamins, herbs, eye drops, creams, and over-the-counter medicines.  Any problems you or family members have had with anesthetic medicines.  Any blood disorders you have.  Any surgeries you have had.  Any medical conditions you have.  Whether you are pregnant or may be pregnant. What are the risks? Generally, this is a safe procedure. However, problems may occur, including:  Infection.  Bleeding.  Allergic reactions to medicines.  Damage to other structures or organs.  A stone remaining in the common bile duct. The common bile duct carries bile from the gallbladder into the small intestine.  A bile leak from the cyst duct that is clipped when your gallbladder is removed. What happens before the procedure? Staying hydrated Follow instructions from your health care provider about hydration, which may include:  Up to 2 hours before the procedure - you may continue to drink  clear liquids, such as water, clear fruit juice, black coffee, and plain tea. Eating and drinking restrictions Follow instructions from your health care provider about eating and drinking, which may include:  8 hours before the procedure - stop eating heavy meals or foods such as meat, fried foods, or fatty foods.  6 hours before the procedure - stop eating light meals or foods, such as toast or cereal.  6 hours before the procedure - stop drinking milk or drinks that contain milk.  2 hours before the procedure - stop drinking clear liquids. Medicines  Ask your health care provider about: ? Changing or stopping your regular medicines. This is especially important if you are taking diabetes medicines or blood thinners. ? Taking medicines such as aspirin and ibuprofen. These medicines can thin your blood. Do not take these medicines before your procedure if your health care provider instructs you not to.  You may be given  antibiotic medicine to help prevent infection. General instructions  Let your health care provider know if you develop a cold or an infection before surgery.  Plan to have someone take you home from the hospital or clinic.  Ask your health care provider how your surgical site will be marked or identified. What happens during the procedure?   To reduce your risk of infection: ? Your health care team will wash or sanitize their hands. ? Your skin will be washed with soap. ? Hair may be removed from the surgical area.  An IV tube may be inserted into one of your veins.  You will be given one or more of the following: ? A medicine to help you relax (sedative). ? A medicine to make you fall asleep (general anesthetic).  A breathing tube will be placed in your mouth.  Your surgeon will make several small cuts (incisions) in your abdomen.  The laparoscope will be inserted through one of the small incisions. The camera on the laparoscope will send images to a TV screen (monitor) in the operating room. This lets your surgeon see inside your abdomen.  Air-like gas will be pumped into your abdomen. This will expand your abdomen to give the surgeon more room to perform the surgery.  Other tools that are needed for the procedure will be inserted through the other incisions. The gallbladder will be removed through one of the incisions.  Your common bile duct may be examined. If stones are found in the common bile duct, they may be removed.  After your gallbladder has been removed, the incisions will be closed with stitches (sutures), staples, or skin glue.  Your incisions may be covered with a bandage (dressing). The procedure may vary among health care providers and hospitals. What happens after the procedure?  Your blood pressure, heart rate, breathing rate, and blood oxygen level will be monitored until the medicines you were given have worn off.  You will be given medicines as needed  to control your pain.  Do not drive for 24 hours if you were given a sedative. This information is not intended to replace advice given to you by your health care provider. Make sure you discuss any questions you have with your health care provider. Document Released: 06/13/2005 Document Revised: 05/11/2017 Document Reviewed: 11/30/2015 Elsevier Interactive Patient Education  2019 [employer] Inc.  General Anesthesia, Adult General anesthesia is the use of medicines to make a person "go to sleep" (unconscious) for a medical procedure. General anesthesia must be used for certain procedures, and is often  recommended for procedures that:  Last a long time.  Require you to be still or in an unusual position.  Are major and can cause blood loss. The medicines used for general anesthesia are called general anesthetics. As well as making you unconscious for a certain amount of time, these medicines:  Prevent pain.  Control your blood pressure.  Relax your muscles. Tell a health care provider about:  Any allergies you have.  All medicines you are taking, including vitamins, herbs, eye drops, creams, and over-the-counter medicines.  Any problems you or family members have had with anesthetic medicines.  Types of anesthetics you have had in the past.  Any blood disorders you have.  Any surgeries you have had.  Any medical conditions you have.  Any recent upper respiratory, chest, or ear infections.  Any history of: ? Heart or lung conditions, such as heart failure, sleep apnea, asthma, or chronic obstructive pulmonary disease (COPD). ? Financial plannerMilitary service. ? Depression or anxiety.  Any tobacco or drug use, including marijuana or alcohol use.  Whether you are pregnant or may be pregnant. What are the risks? Generally, this is a safe procedure. However, problems may occur, including:  Allergic reaction.  Lung and heart problems.  Inhaling food or liquid from the stomach into the  lungs (aspiration).  Nerve injury.  Dental injury.  Air in the bloodstream, which can lead to stroke.  Extreme agitation or confusion (delirium) when you wake up from the anesthetic.  Waking up during your procedure and being unable to move. This is rare. These problems are more likely to develop if you are having a major surgery or if you have an advanced or serious medical condition. You can prevent some of these complications by answering all of your health care provider's questions thoroughly and by following all instructions before your procedure. General anesthesia can cause side effects, including:  Nausea or vomiting.  A sore throat from the breathing tube.  Hoarseness.  Wheezing or coughing.  Shaking chills.  Tiredness.  Body aches.  Anxiety.  Sleepiness or drowsiness.  Confusion or agitation. What happens before the procedure? Staying hydrated Follow instructions from your health care provider about hydration, which may include:  Up to 2 hours before the procedure - you may continue to drink clear liquids, such as water, clear fruit juice, black coffee, and plain tea.  Eating and drinking restrictions Follow instructions from your health care provider about eating and drinking, which may include:  8 hours before the procedure - stop eating heavy meals or foods such as meat, fried foods, or fatty foods.  6 hours before the procedure - stop eating light meals or foods, such as toast or cereal.  6 hours before the procedure - stop drinking milk or drinks that contain milk.  2 hours before the procedure - stop drinking clear liquids. Medicines Ask your health care provider about:  Changing or stopping your regular medicines. This is especially important if you are taking diabetes medicines or blood thinners.  Taking medicines such as aspirin and ibuprofen. These medicines can thin your blood. Do not take these medicines unless your health care provider  tells you to take them.  Taking over-the-counter medicines, vitamins, herbs, and supplements. Do not take these during the week before your procedure unless your health care provider approves them. General instructions  Starting 3-6 weeks before the procedure, do not use any products that contain nicotine or tobacco, such as cigarettes and e-cigarettes. If you need help quitting,  ask your health care provider.  If you brush your teeth on the morning of the procedure, make sure to spit out all of the toothpaste.  Tell your health care provider if you become ill or develop a cold, cough, or fever.  If instructed by your health care provider, bring your sleep apnea device with you on the day of your surgery (if applicable).  Ask your health care provider if you will be going home the same day, the following day, or after a longer hospital stay. ? Plan to have someone take you home from the hospital or clinic. ? Plan to have a responsible adult care for you for at least 24 hours after you leave the hospital or clinic. This is important. What happens during the procedure?   You will be given anesthetics through both of the following: ? A mask placed over your nose and mouth. ? An IV in one of your veins.  You may receive a medicine to help you relax (sedative).  After you are unconscious, a breathing tube may be inserted down your throat to help you breathe. This will be removed before you wake up.  An anesthesia specialist will stay with you throughout your procedure. He or she will: ? Keep you comfortable and safe by continuing to give you medicines and adjusting the amount of medicine that you get. ? Monitor your blood pressure, pulse, and oxygen levels to make sure that the anesthetics do not cause any problems. The procedure may vary among health care providers and hospitals. What happens after the procedure?  Your blood pressure, temperature, heart rate, breathing rate, and blood  oxygen level will be monitored until the medicines you were given have worn off.  You will wake up in a recovery area. You may wake up slowly.  If you feel anxious or agitated, you may be given medicine to help you calm down.  If you will be going home the same day, your health care provider may check to make sure you can walk, drink, and urinate.  Your health care provider will treat any pain or side effects you have before you go home.  Do not drive for 24 hours if you were given a sedative. Summary  General anesthesia is used to keep you still and prevent pain during a procedure.  It is important to tell your health care provider about your medical history and any surgeries you have had, and previous experience with anesthesia.  Follow your health care provider's instructions about when to stop eating, drinking, or taking certain medicines before your procedure.  Plan to have someone take you home from the hospital or clinic. This information is not intended to replace advice given to you by your health care provider. Make sure you discuss any questions you have with your health care provider. Document Released: 09/20/2007 Document Revised: 10/31/2017 Document Reviewed: 01/27/2017 Elsevier Interactive Patient Education  2019 [employer] Inc. General Anesthesia, Adult, Care After This sheet gives you information about how to care for yourself after your procedure. Your health care provider may also give you more specific instructions. If you have problems or questions, contact your health care provider. What can I expect after the procedure? After the procedure, the following side effects are common:  Pain or discomfort at the IV site.  Nausea.  Vomiting.  Sore throat.  Trouble concentrating.  Feeling cold or chills.  Weak or tired.  Sleepiness and fatigue.  Soreness and body aches. These side effects can  affect parts of the body that were not involved in surgery. Follow  these instructions at home:  For at least 24 hours after the procedure:  Have a responsible adult stay with you. It is important to have someone help care for you until you are awake and alert.  Rest as needed.  Do not: ? Participate in activities in which you could fall or become injured. ? Drive. ? Use heavy machinery. ? Drink alcohol. ? Take sleeping pills or medicines that cause drowsiness. ? Make important decisions or sign legal documents. ? Take care of children on your own. Eating and drinking  Follow any instructions from your health care provider about eating or drinking restrictions.  When you feel hungry, start by eating small amounts of foods that are soft and easy to digest (bland), such as toast. Gradually return to your regular diet.  Drink enough fluid to keep your urine pale yellow.  If you vomit, rehydrate by drinking water, juice, or clear broth. General instructions  If you have sleep apnea, surgery and certain medicines can increase your risk for breathing problems. Follow instructions from your health care provider about wearing your sleep device: ? Anytime you are sleeping, including during daytime naps. ? While taking prescription pain medicines, sleeping medicines, or medicines that make you drowsy.  Return to your normal activities as told by your health care provider. Ask your health care provider what activities are safe for you.  Take over-the-counter and prescription medicines only as told by your health care provider.  If you smoke, do not smoke without supervision.  Keep all follow-up visits as told by your health care provider. This is important. Contact a health care provider if:  You have nausea or vomiting that does not get better with medicine.  You cannot eat or drink without vomiting.  You have pain that does not get better with medicine.  You are unable to pass urine.  You develop a skin rash.  You have a fever.  You have  redness around your IV site that gets worse. Get help right away if:  You have difficulty breathing.  You have chest pain.  You have blood in your urine or stool, or you vomit blood. Summary  After the procedure, it is common to have a sore throat or nausea. It is also common to feel tired.  Have a responsible adult stay with you for the first 24 hours after general anesthesia. It is important to have someone help care for you until you are awake and alert.  When you feel hungry, start by eating small amounts of foods that are soft and easy to digest (bland), such as toast. Gradually return to your regular diet.  Drink enough fluid to keep your urine pale yellow.  Return to your normal activities as told by your health care provider. Ask your health care provider what activities are safe for you. This information is not intended to replace advice given to you by your health care provider. Make sure you discuss any questions you have with your health care provider. Document Released: 09/19/2000 Document Revised: 01/27/2017 Document Reviewed: 01/27/2017 Elsevier Interactive Patient Education  2019 [employer].

## 2018-08-13 ENCOUNTER — Encounter (HOSPITAL_COMMUNITY): Payer: Self-pay

## 2018-08-13 ENCOUNTER — Encounter (HOSPITAL_COMMUNITY)
Admission: RE | Admit: 2018-08-13 | Discharge: 2018-08-13 | Disposition: A | Discharge status: Home / Self Care | Payer: Medicare Other | Source: Ambulatory Visit | Attending: General Surgery | Admitting: General Surgery

## 2018-08-13 ENCOUNTER — Other Ambulatory Visit: Payer: Self-pay

## 2018-08-13 DIAGNOSIS — Z01812 Encounter for preprocedural laboratory examination: Secondary | ICD-10-CM | POA: Insufficient documentation

## 2018-08-13 LAB — COMPREHENSIVE METABOLIC PANEL
ALT: 12 U/L (ref 0–44)
AST: 19 U/L (ref 15–41)
Albumin: 4 g/dL (ref 3.5–5.0)
Alkaline Phosphatase: 64 U/L (ref 38–126)
Anion gap: 10 (ref 5–15)
BUN: 18 mg/dL (ref 8–23)
CO2: 23 mmol/L (ref 22–32)
Calcium: 9.3 mg/dL (ref 8.9–10.3)
Chloride: 106 mmol/L (ref 98–111)
Creatinine, Ser: 0.79 mg/dL (ref 0.44–1.00)
GFR calc non Af Amer: >60 mL/min (ref >60)
Glucose, Bld: 89 mg/dL (ref 70–99)
Potassium: 3.8 mmol/L (ref 3.5–5.1)
Sodium: 139 mmol/L (ref 135–145)
Total Bilirubin: 0.7 mg/dL (ref 0.3–1.2)
Total Protein: 7.1 g/dL (ref 6.5–8.1)

## 2018-08-13 LAB — CBC WITH DIFFERENTIAL/PLATELET
Abs Immature Granulocytes: 0 10*3/uL (ref 0.00–0.07)
Basophils Absolute: 0 10*3/uL (ref 0.0–0.1)
Basophils Relative: 1 %
Eosinophils Absolute: 0.1 10*3/uL (ref 0.0–0.5)
Eosinophils Relative: 4 %
HCT: 40.1 % (ref 36.0–46.0)
Hemoglobin: 13 g/dL (ref 12.0–15.0)
IMMATURE GRANULOCYTES: 0 %
LYMPHS PCT: 31 %
Lymphs Abs: 1.1 10*3/uL (ref 0.7–4.0)
MCH: 30.7 pg (ref 26.0–34.0)
MCHC: 32.4 g/dL (ref 30.0–36.0)
MCV: 94.6 fL (ref 80.0–100.0)
MONOS PCT: 12 %
Monocytes Absolute: 0.4 10*3/uL (ref 0.1–1.0)
Neutro Abs: 1.9 10*3/uL (ref 1.7–7.7)
Neutrophils Relative %: 52 %
Platelets: 236 10*3/uL (ref 150–400)
RBC: 4.24 MIL/uL (ref 3.87–5.11)
RDW: 14.8 % (ref 11.5–15.5)
WBC: 3.6 10*3/uL — ABNORMAL LOW (ref 4.0–10.5)
nRBC: 0 % (ref 0.0–0.2)

## 2018-08-13 LAB — LIPASE, BLOOD: Lipase: 40 U/L (ref 11–51)

## 2018-08-17 ENCOUNTER — Ambulatory Visit (HOSPITAL_COMMUNITY): Payer: Medicare Other | Admitting: Anesthesiology

## 2018-08-17 ENCOUNTER — Ambulatory Visit (HOSPITAL_COMMUNITY)
Admission: RE | Admit: 2018-08-17 | Discharge: 2018-08-17 | Disposition: A | Discharge status: Home / Self Care | Payer: Medicare Other | Attending: General Surgery | Admitting: General Surgery

## 2018-08-17 ENCOUNTER — Encounter (HOSPITAL_COMMUNITY)
Admission: RE | Disposition: A | Discharge status: Home / Self Care | Payer: Self-pay | Source: Home / Self Care | Attending: General Surgery

## 2018-08-17 ENCOUNTER — Encounter (HOSPITAL_COMMUNITY): Payer: Self-pay

## 2018-08-17 DIAGNOSIS — D649 Anemia, unspecified: Secondary | ICD-10-CM | POA: Insufficient documentation

## 2018-08-17 DIAGNOSIS — I110 Hypertensive heart disease with heart failure: Secondary | ICD-10-CM | POA: Insufficient documentation

## 2018-08-17 DIAGNOSIS — K219 Gastro-esophageal reflux disease without esophagitis: Secondary | ICD-10-CM | POA: Diagnosis not present

## 2018-08-17 DIAGNOSIS — K851 Biliary acute pancreatitis without necrosis or infection: Secondary | ICD-10-CM | POA: Diagnosis not present

## 2018-08-17 DIAGNOSIS — M1990 Unspecified osteoarthritis, unspecified site: Secondary | ICD-10-CM | POA: Diagnosis not present

## 2018-08-17 DIAGNOSIS — K8010 Calculus of gallbladder with chronic cholecystitis without obstruction: Secondary | ICD-10-CM | POA: Insufficient documentation

## 2018-08-17 DIAGNOSIS — K828 Other specified diseases of gallbladder: Secondary | ICD-10-CM | POA: Diagnosis not present

## 2018-08-17 DIAGNOSIS — I25119 Atherosclerotic heart disease of native coronary artery with unspecified angina pectoris: Secondary | ICD-10-CM | POA: Diagnosis not present

## 2018-08-17 DIAGNOSIS — I509 Heart failure, unspecified: Secondary | ICD-10-CM | POA: Insufficient documentation

## 2018-08-17 HISTORY — PX: CHOLECYSTECTOMY: SHX55

## 2018-08-17 SURGERY — LAPAROSCOPIC CHOLECYSTECTOMY
Anesthesia: General

## 2018-08-17 MED ORDER — PROPOFOL 10 MG/ML IV BOLUS
INTRAVENOUS | Status: DC | PRN
  Administered 2018-08-17: 100 mg via INTRAVENOUS

## 2018-08-17 MED ORDER — PROPOFOL 10 MG/ML IV BOLUS
INTRAVENOUS | Status: AC
  Filled 2018-08-17: qty 40

## 2018-08-17 MED ORDER — BUPIVACAINE LIPOSOME 1.3 % IJ SUSP
INTRAMUSCULAR | Status: AC
  Filled 2018-08-17: qty 20

## 2018-08-17 MED ORDER — FENTANYL CITRATE (PF) 250 MCG/5ML IJ SOLN
INTRAMUSCULAR | Status: AC
  Filled 2018-08-17: qty 5

## 2018-08-17 MED ORDER — CHLORHEXIDINE GLUCONATE CLOTH 2 % EX PADS: 6.0000 | MEDICATED_PAD | Freq: Once | CUTANEOUS | Status: DC

## 2018-08-17 MED ORDER — BUPIVACAINE LIPOSOME 1.3 % IJ SUSP
INTRAMUSCULAR | Status: DC | PRN
  Administered 2018-08-17: 20 mL

## 2018-08-17 MED ORDER — ONDANSETRON HCL 4 MG/2ML IJ SOLN
INTRAMUSCULAR | Status: DC | PRN
  Administered 2018-08-17: 4 mg via INTRAVENOUS

## 2018-08-17 MED ORDER — CIPROFLOXACIN IN D5W 400 MG/200ML IV SOLN
INTRAVENOUS | Status: AC
  Filled 2018-08-17: qty 200

## 2018-08-17 MED ORDER — POVIDONE-IODINE 10 % EX OINT
TOPICAL_OINTMENT | CUTANEOUS | Status: AC
  Filled 2018-08-17: qty 1

## 2018-08-17 MED ORDER — PROMETHAZINE HCL 25 MG/ML IJ SOLN
6.2500 mg | INTRAMUSCULAR | Status: DC | PRN
  Filled 2018-08-17: qty 1

## 2018-08-17 MED ORDER — SODIUM CHLORIDE 0.9 % IR SOLN
Status: DC | PRN
  Administered 2018-08-17: 3000 mL

## 2018-08-17 MED ORDER — HEMOSTATIC AGENTS (NO CHARGE) OPTIME
TOPICAL | Status: DC | PRN
  Administered 2018-08-17 (×2): 1 via TOPICAL

## 2018-08-17 MED ORDER — OXYCODONE-ACETAMINOPHEN 5-325 MG PO TABS: 1.0000 | ORAL_TABLET | ORAL | 0 refills | Status: DC | PRN

## 2018-08-17 MED ORDER — EPHEDRINE SULFATE 50 MG/ML IJ SOLN
10.0000 mg | INTRAMUSCULAR | Status: DC
  Filled 2018-08-17 (×10): qty 0.2

## 2018-08-17 MED ORDER — HYDROCODONE-ACETAMINOPHEN 7.5-325 MG PO TABS: 1.0000 | ORAL_TABLET | Freq: Once | ORAL | Status: DC | PRN

## 2018-08-17 MED ORDER — HYDROMORPHONE HCL 1 MG/ML IJ SOLN: 0.2500 mg | INTRAMUSCULAR | Status: DC | PRN

## 2018-08-17 MED ORDER — GLYCOPYRROLATE 0.2 MG/ML IJ SOLN
0.2000 mg | Freq: Once | INTRAMUSCULAR | Status: AC
  Administered 2018-08-17: 0.2 mg via INTRAVENOUS

## 2018-08-17 MED ORDER — LACTATED RINGERS IV SOLN
INTRAVENOUS | Status: DC
  Administered 2018-08-17: 07:00:00 via INTRAVENOUS

## 2018-08-17 MED ORDER — SODIUM CHLORIDE 0.9 % IR SOLN
Status: DC | PRN
  Administered 2018-08-17: 1000 mL

## 2018-08-17 MED ORDER — POVIDONE-IODINE 10 % OINT PACKET
TOPICAL_OINTMENT | CUTANEOUS | Status: DC | PRN
  Administered 2018-08-17: 1 via TOPICAL

## 2018-08-17 MED ORDER — ROCURONIUM BROMIDE 10 MG/ML (PF) SYRINGE
PREFILLED_SYRINGE | INTRAVENOUS | Status: AC
  Filled 2018-08-17: qty 10

## 2018-08-17 MED ORDER — SODIUM CHLORIDE 0.9% FLUSH
INTRAVENOUS | Status: AC
  Filled 2018-08-17: qty 10

## 2018-08-17 MED ORDER — MIDAZOLAM HCL 2 MG/2ML IJ SOLN: 0.5000 mg | Freq: Once | INTRAMUSCULAR | Status: DC | PRN

## 2018-08-17 MED ORDER — FENTANYL CITRATE (PF) 100 MCG/2ML IJ SOLN
INTRAMUSCULAR | Status: DC | PRN
  Administered 2018-08-17: 75 ug via INTRAVENOUS
  Administered 2018-08-17: 25 ug via INTRAVENOUS

## 2018-08-17 MED ORDER — PHENYLEPHRINE HCL 10 MG/ML IJ SOLN
INTRAMUSCULAR | Status: DC | PRN
  Administered 2018-08-17: 40 ug via INTRAVENOUS
  Administered 2018-08-17 (×3): 80 ug via INTRAVENOUS

## 2018-08-17 MED ORDER — SUGAMMADEX SODIUM 200 MG/2ML IV SOLN
INTRAVENOUS | Status: DC | PRN
  Administered 2018-08-17: 200 mg via INTRAVENOUS

## 2018-08-17 MED ORDER — EPHEDRINE SULFATE 50 MG/ML IJ SOLN
INTRAMUSCULAR | Status: DC | PRN
  Administered 2018-08-17 (×2): 5 mg via INTRAVENOUS
  Administered 2018-08-17 (×3): 10 mg via INTRAVENOUS
  Administered 2018-08-17: 5 mg via INTRAVENOUS

## 2018-08-17 MED ORDER — CIPROFLOXACIN IN D5W 400 MG/200ML IV SOLN
400.0000 mg | INTRAVENOUS | Status: AC
  Administered 2018-08-17: 400 mg via INTRAVENOUS

## 2018-08-17 MED ORDER — EPHEDRINE SULFATE (PRESSORS) 50 MG/10ML IJ SOSY
10.0000 mg | PREFILLED_SYRINGE | INTRAMUSCULAR | Status: DC | PRN
  Filled 2018-08-17: qty 1

## 2018-08-17 MED ORDER — PHENYLEPHRINE 40 MCG/ML (10ML) SYRINGE FOR IV PUSH (FOR BLOOD PRESSURE SUPPORT)
PREFILLED_SYRINGE | INTRAVENOUS | Status: AC
  Filled 2018-08-17: qty 10

## 2018-08-17 MED ORDER — ROCURONIUM 10MG/ML (10ML) SYRINGE FOR MEDFUSION PUMP - OPTIME
INTRAVENOUS | Status: DC | PRN
  Administered 2018-08-17: 40 mg via INTRAVENOUS

## 2018-08-17 MED ORDER — EPHEDRINE 5 MG/ML INJ
INTRAVENOUS | Status: AC
  Filled 2018-08-17: qty 10

## 2018-08-17 MED ORDER — KETOROLAC TROMETHAMINE 30 MG/ML IJ SOLN
15.0000 mg | Freq: Once | INTRAMUSCULAR | Status: DC
  Filled 2018-08-17: qty 1

## 2018-08-17 SURGICAL SUPPLY — 48 items
APPLICATOR ARISTA FLEXITIP XL (MISCELLANEOUS) ×2 IMPLANT
APPLIER CLIP ROT 10 11.4 M/L (STAPLE) ×2
BAG RETRIEVAL 10 (BASKET) ×1
CHLORAPREP W/TINT 26ML (MISCELLANEOUS) ×2 IMPLANT
CLIP APPLIE ROT 10 11.4 M/L (STAPLE) ×1 IMPLANT
CLOTH BEACON ORANGE TIMEOUT ST (SAFETY) ×2 IMPLANT
COVER LIGHT HANDLE STERIS (MISCELLANEOUS) ×4 IMPLANT
COVER WAND RF STERILE (DRAPES) ×2 IMPLANT
CUTTER FLEX LINEAR 45M (STAPLE) ×2 IMPLANT
ELECT REM PT RETURN 9FT ADLT (ELECTROSURGICAL) ×2
ELECTRODE REM PT RTRN 9FT ADLT (ELECTROSURGICAL) ×1 IMPLANT
FILTER SMOKE EVAC LAPAROSHD (FILTER) ×2 IMPLANT
GLOVE BIOGEL PI IND STRL 7.0 (GLOVE) ×1 IMPLANT
GLOVE BIOGEL PI INDICATOR 7.0 (GLOVE) ×1
GLOVE ECLIPSE 6.5 STRL STRAW (GLOVE) ×2 IMPLANT
GLOVE SURG SS PI 7.5 STRL IVOR (GLOVE) ×2 IMPLANT
GOWN STRL REUS W/ TWL XL LVL3 (GOWN DISPOSABLE) ×1 IMPLANT
GOWN STRL REUS W/TWL LRG LVL3 (GOWN DISPOSABLE) ×4 IMPLANT
GOWN STRL REUS W/TWL XL LVL3 (GOWN DISPOSABLE) ×1
HEMOSTAT ARISTA ABSORB 3G PWDR (HEMOSTASIS) ×2 IMPLANT
HEMOSTAT SNOW SURGICEL 2X4 (HEMOSTASIS) ×2 IMPLANT
INST SET LAPROSCOPIC AP (KITS) ×2 IMPLANT
IV NS IRRIG 3000ML ARTHROMATIC (IV SOLUTION) ×2 IMPLANT
KIT TURNOVER KIT A (KITS) ×2 IMPLANT
MANIFOLD NEPTUNE II (INSTRUMENTS) ×2 IMPLANT
NEEDLE HYPO 18GX1.5 BLUNT FILL (NEEDLE) ×2 IMPLANT
NEEDLE HYPO 22GX1.5 SAFETY (NEEDLE) ×2 IMPLANT
NEEDLE INSUFFLATION 14GA 120MM (NEEDLE) ×2 IMPLANT
NS IRRIG 1000ML POUR BTL (IV SOLUTION) ×2 IMPLANT
PACK LAP CHOLE LZT030E (CUSTOM PROCEDURE TRAY) ×2 IMPLANT
PAD ARMBOARD 7.5X6 YLW CONV (MISCELLANEOUS) ×2 IMPLANT
RELOAD 45 VASCULAR/THIN (ENDOMECHANICALS) ×2 IMPLANT
SET BASIN LINEN APH (SET/KITS/TRAYS/PACK) ×2 IMPLANT
SET TUBE IRRIG SUCTION NO TIP (IRRIGATION / IRRIGATOR) ×2 IMPLANT
SLEEVE ENDOPATH XCEL 5M (ENDOMECHANICALS) ×2 IMPLANT
SPONGE GAUZE 2X2 8PLY STRL LF (GAUZE/BANDAGES/DRESSINGS) ×8 IMPLANT
STAPLER VISISTAT (STAPLE) ×2 IMPLANT
SUT VICRYL 0 UR6 27IN ABS (SUTURE) ×2 IMPLANT
SYR 20CC LL (SYRINGE) ×2 IMPLANT
SYS BAG RETRIEVAL 10MM (BASKET) ×1
SYSTEM BAG RETRIEVAL 10MM (BASKET) ×1 IMPLANT
TAPE CLOTH SURG 4X10 WHT LF (GAUZE/BANDAGES/DRESSINGS) ×2 IMPLANT
TROCAR ENDO BLADELESS 11MM (ENDOMECHANICALS) ×2 IMPLANT
TROCAR XCEL NON-BLD 5MMX100MML (ENDOMECHANICALS) ×2 IMPLANT
TROCAR XCEL UNIV SLVE 11M 100M (ENDOMECHANICALS) ×2 IMPLANT
TUBE CONNECTING 12X1/4 (SUCTIONS) ×2 IMPLANT
TUBING INSUFFLATION (TUBING) ×2 IMPLANT
WARMER LAPAROSCOPE (MISCELLANEOUS) ×2 IMPLANT

## 2018-08-17 NOTE — Discharge Instructions (Signed)
Laparoscopic Cholecystectomy, Care After  This sheet gives you information about how to care for yourself after your procedure. Your health care provider may also give you more specific instructions. If you have problems or questions, contact your health care provider.  What can I expect after the procedure?  After the procedure, it is common to have:   Pain at your incision sites. You will be given medicines to control this pain.   Mild nausea or vomiting.    Bloating and possible shoulder pain from the air-like gas that was used during the procedure.  Follow these instructions at home:  Incision care     Follow instructions from your health care provider about how to take care of your incisions. Make sure you:  ? Wash your hands with soap and water before you change your bandage (dressing). If soap and water are not available, use hand sanitizer.  ? Change your dressing as told by your health care provider.  ? Leave stitches (sutures), skin glue, or adhesive strips in place. These skin closures may need to be in place for 2 weeks or longer. If adhesive strip edges start to loosen and curl up, you may trim the loose edges. Do not remove adhesive strips completely unless your health care provider tells you to do that.   Do not take baths, swim, or use a hot tub until your health care provider approves. Ask your health care provider if you can take showers. You may only be allowed to take sponge baths for bathing.   Check your incision area every day for signs of infection. Check for:  ? More redness, swelling, or pain.  ? More fluid or blood.  ? Warmth.  ? Pus or a bad smell.  Activity   Do not drive or use heavy machinery while taking prescription pain medicine.   Do not lift anything that is heavier than 10 lb (4.5 kg) until your health care provider approves.   Do not play contact sports until your health care provider approves.   Do not drive for 24 hours if you were given a medicine to help you relax  (sedative).   Rest as needed. Do not return to work or school until your health care provider approves.  General instructions   Take over-the-counter and prescription medicines only as told by your health care provider.   To prevent or treat constipation while you are taking prescription pain medicine, your health care provider may recommend that you:  ? Drink enough fluid to keep your urine clear or pale yellow.  ? Take over-the-counter or prescription medicines.  ? Eat foods that are high in fiber, such as fresh fruits and vegetables, whole grains, and beans.  ? Limit foods that are high in fat and processed sugars, such as fried and sweet foods.  Contact a health care provider if:   You develop a rash.   You have more redness, swelling, or pain around your incisions.   You have more fluid or blood coming from your incisions.   Your incisions feel warm to the touch.   You have pus or a bad smell coming from your incisions.   You have a fever.   One or more of your incisions breaks open.  Get help right away if:   You have trouble breathing.   You have chest pain.   You have increasing pain in your shoulders.   You faint or feel dizzy when you stand.   You have   severe pain in your abdomen.   You have nausea or vomiting that lasts for more than one day.   You have leg pain.  This information is not intended to replace advice given to you by your health care provider. Make sure you discuss any questions you have with your health care provider.  Document Released: 06/13/2005 Document Revised: 01/02/2016 Document Reviewed: 11/30/2015  Elsevier Interactive Patient Education  2019 [employer] Inc[employer]

## 2018-08-17 NOTE — Op Note (Signed)
Patient:  Dana [name]  DOB:  02/25/1941  MRN:  [id number]   Preop Diagnosis: History of gallstone pancreatitis  Postop Diagnosis: Same  Procedure: Laparoscopic cholecystectomy  Surgeon: [name], MD  Anes: General endotracheal  Indications: Patient is a 78 year old female who presents with a history of gallstone pancreatitis.  She has been cleared by cardiology for anesthesia.  The risks and benefits of the procedure including bleeding, infection, hepatobiliary injury, the possibility of an open procedure were fully explained to the patient, who gave informed consent.  Procedure note: The patient was placed in the supine position.  After induction of general endotracheal anesthesia, the abdomen was prepped and draped using the usual sterile technique with ChloraPrep.  Surgical site confirmation was performed.  A supraumbilical incision was made down to the fascia.  A Veress needle was introduced into the abdominal cavity and confirmation of placement was done using the saline drop test.  The abdomen was then insufflated to 16 mmHg pressure.  An 11 mm trocar was introduced into the abdominal cavity under direct visualization without difficulty.  The patient was placed in reverse Trendelenburg position and an additional 1 mm trocar was placed in the epigastric region and 5 mm trochars were placed the right upper quadrant and right flank regions.  Liver was inspected and noted to be within normal limits.  There was a significant amount of adhesions of omentum to the gallbladder.  These were freed away using blunt dissection and Bovie electrocautery.  The gallbladder was then retracted in a dynamic fashion in order to provide exposure.  A dome down approach was used as the infundibulum was noted to be significantly scarred with chronic inflammation from her previous pancreatitis.  I could not safely expose the triangle of Calot.  I thus elected to proceed with removal of the gallbladder across  the infundibulum with a vascular Endo GIA.  Care was taken to avoid the common bile duct.  The gallbladder was then removed using an Endo Catch bag without difficulty.  Several stones were also removed.  The gallbladder bed was inspected and no abnormal bleeding or bile leakage was noted.  Surgicel was placed in the gallbladder fossa as well as Arista.  All fluid and air were then evacuated from the abdominal cavity prior to removal of the trochars.  All wounds were irrigated with normal saline.  All wounds were injected with Exparel.  The supraumbilical fascia was reapproximated using an 0 Vicryl interrupted suture.  All skin incisions were closed using staples.  Betadine ointment and dry sterile dressings were applied.  All tape and needle counts were correct at the end of the procedure.  The patient was extubated in the operating room and transferred to PACU in stable condition.  Complications: None  EBL: Minimal  Specimen: Gallbladder

## 2018-08-17 NOTE — Anesthesia Procedure Notes (Signed)
Procedure Name: Intubation Date/Time: 08/17/2018 7:39 AM Performed by: Ollen Bowl, CRNA Pre-anesthesia Checklist: Patient identified, Patient being monitored, Timeout performed, Emergency Drugs available and Suction available Patient Re-evaluated:Patient Re-evaluated prior to induction Oxygen Delivery Method: Circle system utilized Preoxygenation: Pre-oxygenation with 100% oxygen Induction Type: IV induction Ventilation: Mask ventilation without difficulty Laryngoscope Size: Mac and 3 Grade View: Grade I Tube type: Oral Tube size: 7.0 mm Number of attempts: 1 Airway Equipment and Method: Stylet Placement Confirmation: ETT inserted through vocal cords under direct vision,  positive ETCO2 and breath sounds checked- equal and bilateral Secured at: 21 cm Tube secured with: Tape Dental Injury: Teeth and Oropharynx as per pre-operative assessment

## 2018-08-17 NOTE — Transfer of Care (Signed)
Immediate Anesthesia Transfer of Care Note  Patient: Dana [name]  Procedure(s) Performed: LAPAROSCOPIC CHOLECYSTECTOMY (N/A )  Patient Location: PACU  Anesthesia Type:General  Level of Consciousness: awake  Airway & Oxygen Therapy: Patient Spontanous Breathing  Post-op Assessment: Report given to RN  Post vital signs: Reviewed and stable  Last Vitals:  Vitals Value Taken Time  BP 85/55 08/17/2018  8:48 AM  Temp 36.6 C 08/17/2018  8:48 AM  Pulse 48 08/17/2018  8:53 AM  Resp 15 08/17/2018  8:53 AM  SpO2 98 % 08/17/2018  8:53 AM  Vitals shown include unvalidated device data.  Last Pain:  Vitals:   08/17/18 0646  TempSrc: Oral  PainSc: 0-No pain      Patients Stated Pain Goal: 5 (08/17/18 3335)  Complications: No apparent anesthesia complications

## 2018-08-17 NOTE — Anesthesia Preprocedure Evaluation (Signed)
Anesthesia Evaluation  Patient identified by MRN, date of birth, ID band Patient awake    Reviewed: Allergy & Precautions, NPO status , Patient's Chart, lab work & pertinent test results, reviewed documented beta blocker date and time   Airway Mallampati: II  TM Distance: >3 FB Neck ROM: Full    Dental no notable dental hx. (+) Missing   Pulmonary neg pulmonary ROS,    Pulmonary exam normal breath sounds clear to auscultation       Cardiovascular Exercise Tolerance: Good hypertension, Pt. on medications + angina with exertion + CAD and +CHF  Normal cardiovascular examI Rhythm:Regular Rate:Normal  HOCM -hyperdynamic on Echo-EF 70s-80s   Neuro/Psych negative neurological ROS  negative psych ROS   GI/Hepatic Neg liver ROS, GERD  Medicated and Controlled,  Endo/Other  negative endocrine ROS  Renal/GU negative Renal ROS  negative genitourinary   Musculoskeletal  (+) Arthritis , Osteoarthritis,    Abdominal   Peds negative pediatric ROS (+)  Hematology negative hematology ROS (+) anemia ,   Anesthesia Other Findings   Reproductive/Obstetrics negative OB ROS                             Anesthesia Physical Anesthesia Plan  ASA: III  Anesthesia Plan: General   Post-op Pain Management:    Induction: Intravenous  PONV Risk Score and Plan:   Airway Management Planned: Oral ETT  Additional Equipment:   Intra-op Plan:   Post-operative Plan: Extubation in OR  Informed Consent: I have reviewed the patients History and Physical, chart, labs and discussed the procedure including the risks, benefits and alternatives for the proposed anesthesia with the patient or authorized representative who has indicated his/her understanding and acceptance.     Dental advisory given  Plan Discussed with: CRNA  Anesthesia Plan Comments:         Anesthesia Quick Evaluation

## 2018-08-17 NOTE — Anesthesia Postprocedure Evaluation (Deleted)
Anesthesia Post Note  Patient: Dana [name]  Procedure(s) Performed: LAPAROSCOPIC CHOLECYSTECTOMY (N/A )  Patient location during evaluation: PACU Anesthesia Type: General Level of consciousness: awake and alert and oriented Pain management: pain level controlled Vital Signs Assessment: post-procedure vital signs reviewed and stable Respiratory status: spontaneous breathing Cardiovascular status: stable Postop Assessment: no apparent nausea or vomiting Anesthetic complications: no     Last Vitals:  Vitals:   08/17/18 0646 08/17/18 0848  BP: (!) 101/59 (!) 85/55  Pulse: 60 (!) 52  Resp: 16 (!) (P) 9  Temp: 36.8 C 36.6 C  SpO2: 97% (P) 100%    Last Pain:  Vitals:   08/17/18 0848  TempSrc:   PainSc: (P) 0-No pain                 Ledford Goodson

## 2018-08-17 NOTE — Interval H&P Note (Signed)
History and Physical Interval Note:  08/17/2018 7:01 AM  Dana [name]  has presented today for surgery, with the diagnosis of gallstone pancreatitis  The various methods of treatment have been discussed with the patient and family. After consideration of risks, benefits and other options for treatment, the patient has consented to  Procedure(s): LAPAROSCOPIC CHOLECYSTECTOMY (N/A) as a surgical intervention .  The patient's history has been reviewed, patient examined, no change in status, stable for surgery.  I have reviewed the patient's chart and labs.  Questions were answered to the patient's satisfaction.     [name]

## 2018-08-17 NOTE — Anesthesia Postprocedure Evaluation (Addendum)
Anesthesia Post Note  Patient: Crystalina Bossard  Procedure(s) Performed: LAPAROSCOPIC CHOLECYSTECTOMY (N/A )  Patient location during evaluation: PACU Anesthesia Type: General Level of consciousness: patient cooperative and sedated Pain management: satisfactory to patient Vital Signs Assessment: post-procedure vital signs reviewed and stable Respiratory status: spontaneous breathing and patient connected to nasal cannula oxygen Cardiovascular status: stable : nausea  Anesthetic complications: no Comments: 1035:  VSS more in line with preop, more awake and appropriate with family member.     Last Vitals:  Vitals:   08/17/18 0950 08/17/18 0955  BP: (!) 82/59   Pulse: (!) 48 (!) 51  Resp: (!) 38 (!) 21  Temp:    SpO2: 95% 96%    Last Pain:  Vitals:   08/17/18 0940  TempSrc:   PainSc: Asleep                 YATES,TERESA

## 2018-08-19 IMAGING — US US EXTREM LOW VENOUS BILAT
1 series · 13 of 24 positions shown · non-contrast
Comparison: 12/12/2016

CLINICAL DATA: 76-year-old female with a history of knee pain



[Series 1: us extrem low venous bilat · 0.07mm/px · 13 of 63 slices shown]
[im 1/63]
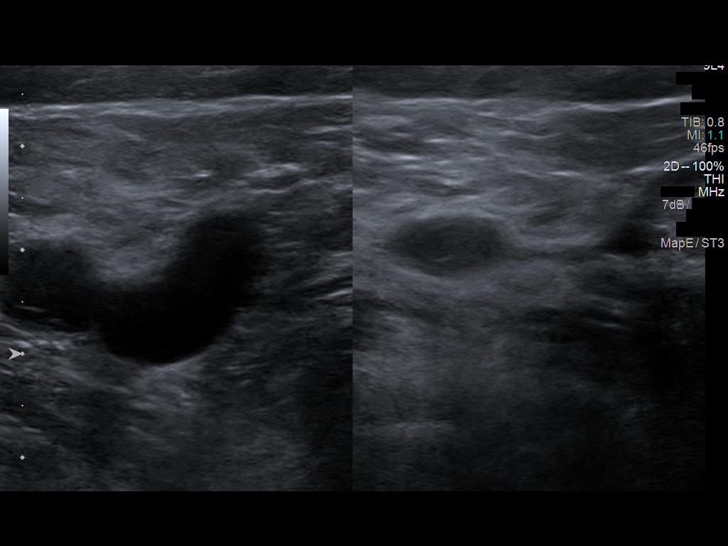
[im 6/63]
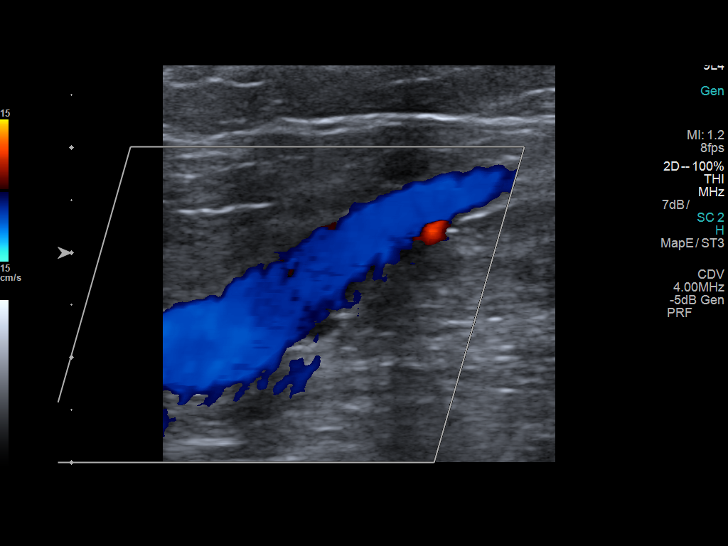
[im 11/63]
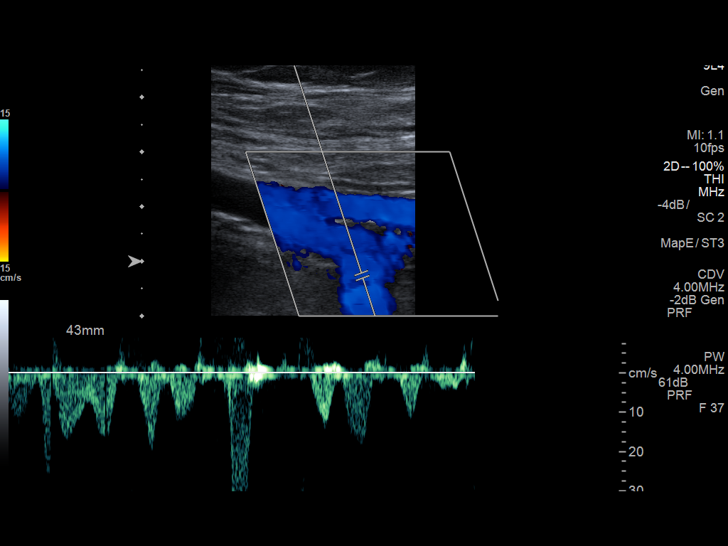
[im 17/63]
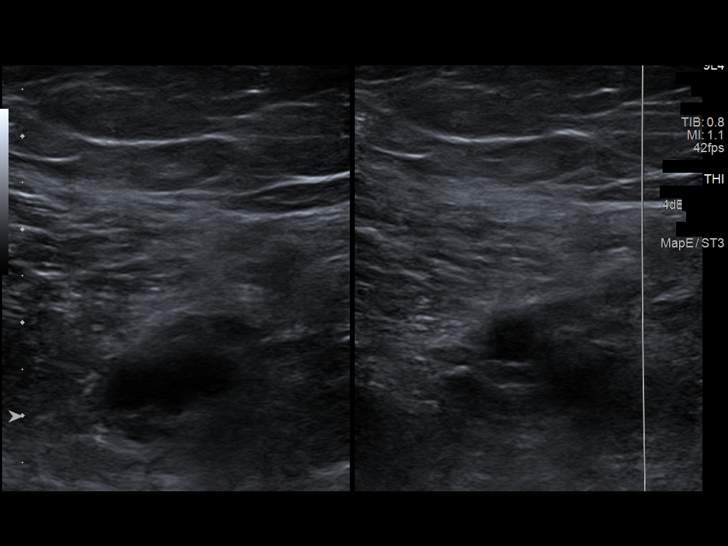
[im 22/63]
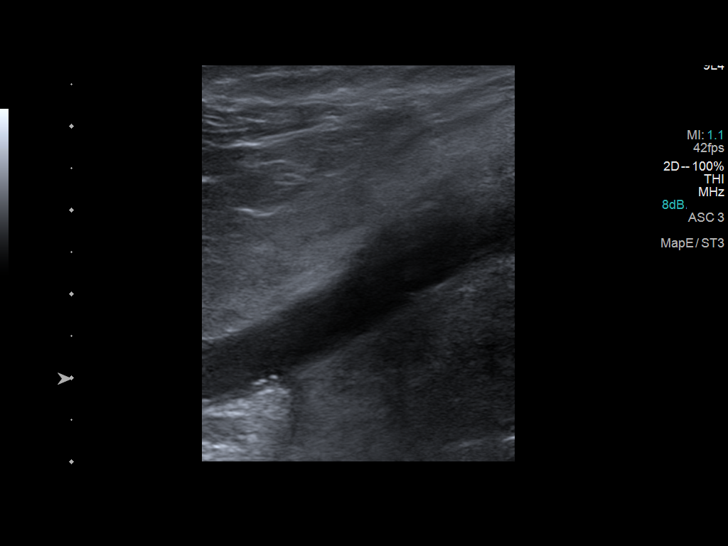
[im 27/63]
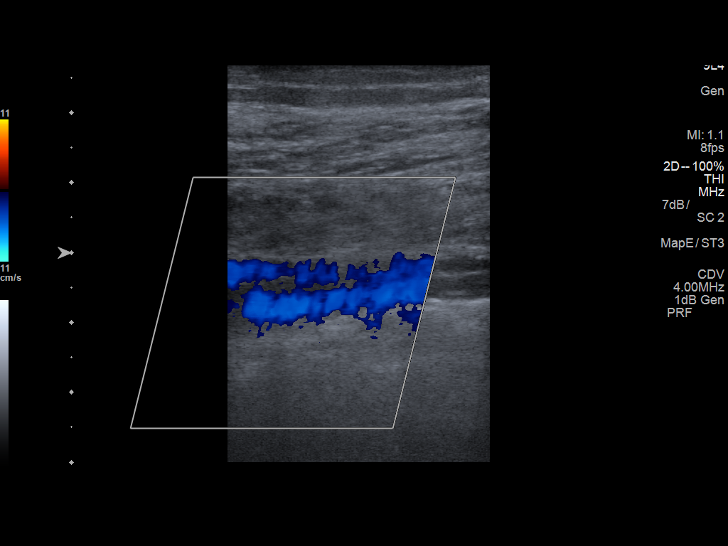
[im 33/63]
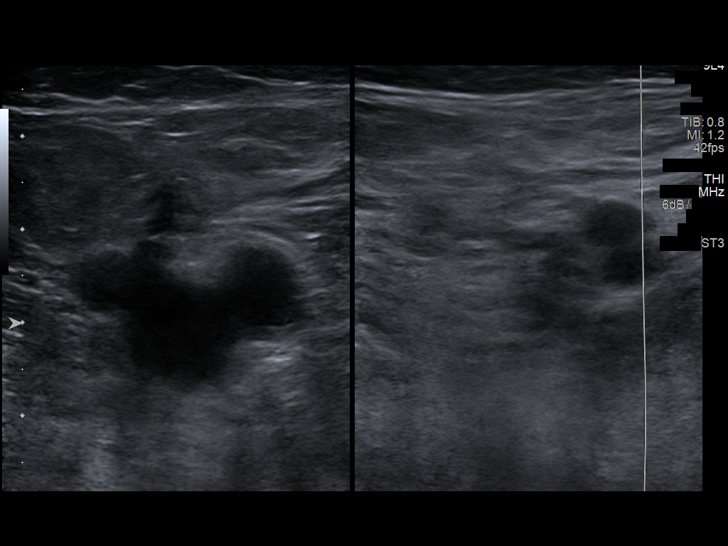
[im 36/63]
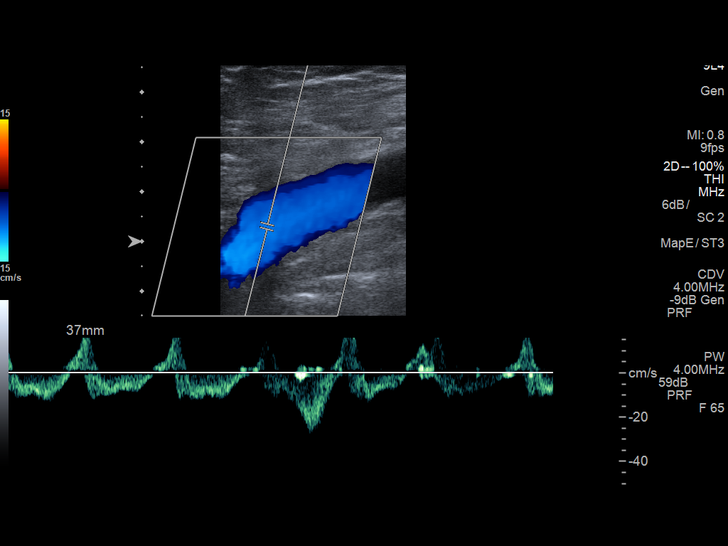
[im 41/63]
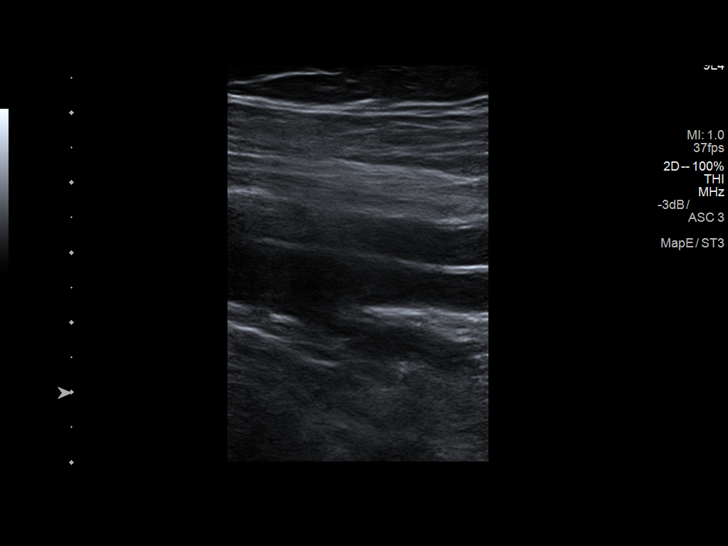
[im 46/63]
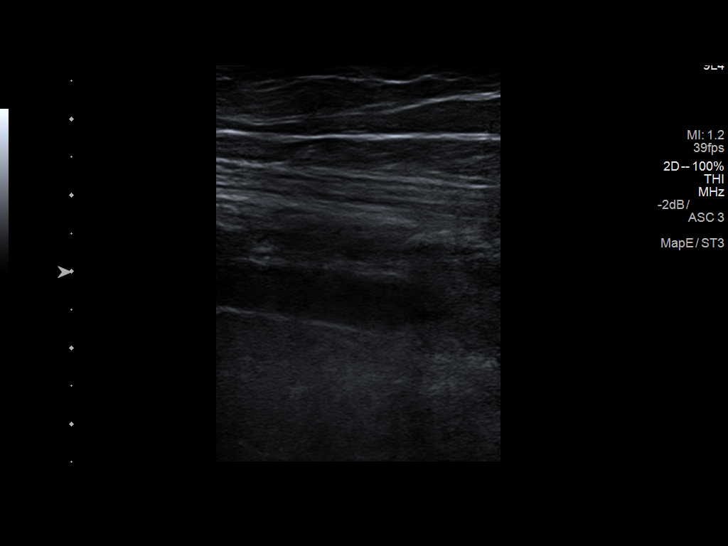
[im 52/63]
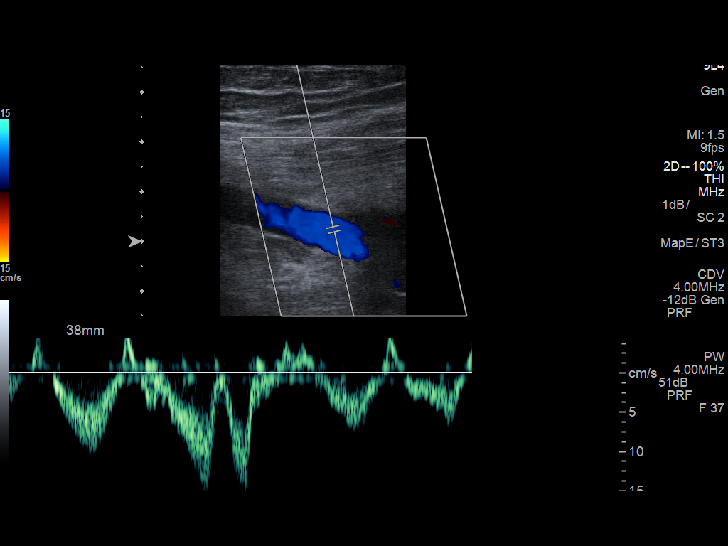
[im 57/63]
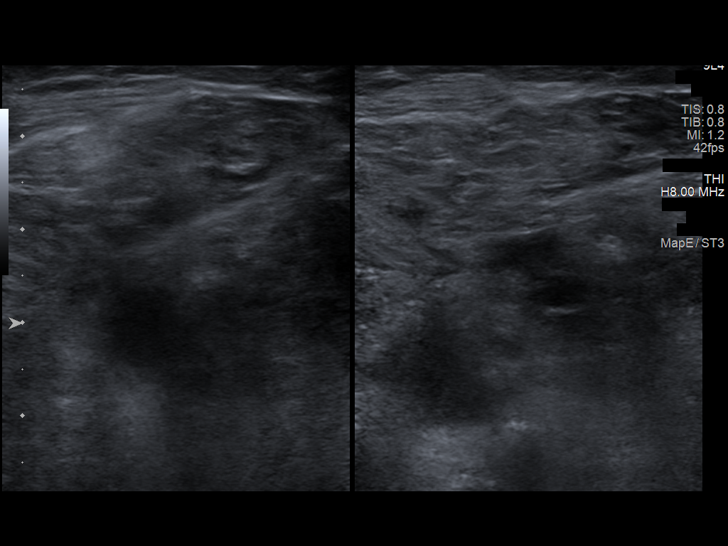
[im 63/63]
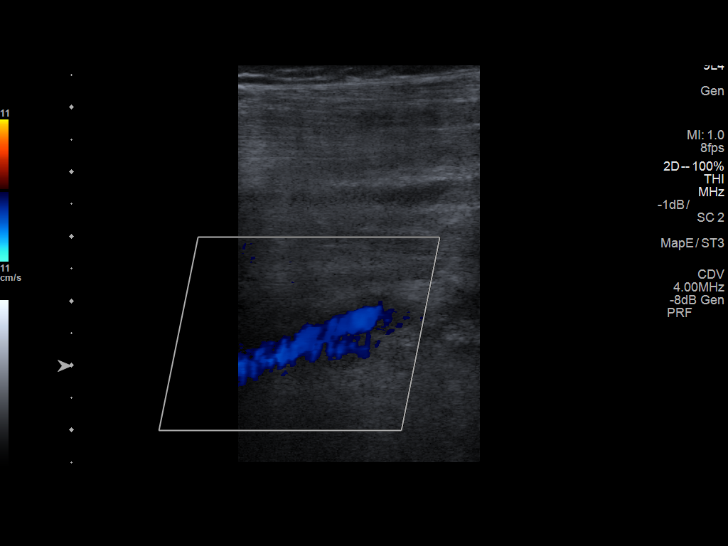

[13 of 24 positions shown; findings below may reference images not displayed]

FINDINGS: RIGHT LOWER EXTREMITY

Common Femoral Vein: No evidence of thrombus. Normal
compressibility, respiratory phasicity and response to augmentation.

Saphenofemoral Junction: No evidence of thrombus. Normal
compressibility and flow on color Doppler imaging.

Profunda Femoral Vein: No evidence of thrombus. Normal
compressibility and flow on color Doppler imaging.

Femoral Vein: No evidence of thrombus. Normal compressibility,
respiratory phasicity and response to augmentation.

Popliteal Vein: No evidence of thrombus. Normal compressibility,
respiratory phasicity and response to augmentation.

Calf Veins: No evidence of thrombus. Normal compressibility and flow
on color Doppler imaging.

Superficial Great Saphenous Vein: No evidence of thrombus. Normal
compressibility and flow on color Doppler imaging.

Other Findings:  None.

LEFT LOWER EXTREMITY

Common Femoral Vein: No evidence of thrombus. Normal
compressibility, respiratory phasicity and response to augmentation.

Saphenofemoral Junction: No evidence of thrombus. Normal
compressibility and flow on color Doppler imaging.

Profunda Femoral Vein: No evidence of thrombus. Normal
compressibility and flow on color Doppler imaging.

Femoral Vein: No evidence of thrombus. Normal compressibility,
respiratory phasicity and response to augmentation.

Popliteal Vein: No evidence of thrombus. Normal compressibility,
respiratory phasicity and response to augmentation.

Calf Veins: No evidence of thrombus. Normal compressibility and flow
on color Doppler imaging.

Superficial Great Saphenous Vein: No evidence of thrombus. Normal
compressibility and flow on color Doppler imaging.

Other Findings:  None.
IMPRESSION: Sonographic survey of the bilateral lower extremities negative for
DVT

## 2018-08-20 ENCOUNTER — Encounter (HOSPITAL_COMMUNITY): Payer: Self-pay | Admitting: General Surgery

## 2018-08-30 ENCOUNTER — Ambulatory Visit (INDEPENDENT_AMBULATORY_CARE_PROVIDER_SITE_OTHER): Payer: Self-pay | Admitting: General Surgery

## 2018-08-30 ENCOUNTER — Ambulatory Visit: Payer: Self-pay | Admitting: General Surgery

## 2018-08-30 ENCOUNTER — Encounter: Payer: Self-pay | Admitting: General Surgery

## 2018-08-30 VITALS — BP 120/70 | HR 74 | Temp 96.4°F | Resp 20 | Wt 173.6 lb

## 2018-08-30 DIAGNOSIS — Z09 Encounter for follow-up examination after completed treatment for conditions other than malignant neoplasm: Secondary | ICD-10-CM

## 2018-08-30 NOTE — Progress Notes (Signed)
Subjective:     Dana [name]  Here for postoperative visit.  Patient doing well.  Has no complaints. Objective:    BP 120/70 (BP Location: Left Arm, Patient Position: Sitting, Cuff Size: Normal)   Pulse 74   Temp (!) 96.4 F (35.8 C) (Temporal)   Resp 20   Wt 173 lb 9.6 oz (78.7 kg)   BMI 29.80 kg/m   General:  alert, cooperative and no distress  Abdomen soft, incisions healing well.  Staples removed. Final pathology consistent with diagnosis.     Assessment:    Doing well postoperatively.    Plan:   May resume all normal activities and medications.  Follow-up here as needed.

## 2018-10-25 ENCOUNTER — Telehealth: Payer: Self-pay | Admitting: Cardiology

## 2018-10-25 NOTE — Telephone Encounter (Signed)
Virtual Visit Pre-Appointment Phone Call  "(Name), I am calling you today to discuss your upcoming appointment. We are currently trying to limit exposure to the virus that causes COVID-19 by seeing patients at home rather than in the office."  1. "What is the BEST phone number to call the day of the visit?" - include this in appointment notes  2. Do you have or have access to (through a family member/friend) a smartphone with video capability that we can use for your visit?" a. If yes - list this number in appt notes as cell (if different from BEST phone #) and list the appointment type as a VIDEO visit in appointment notes b. If no - list the appointment type as a PHONE visit in appointment notes  3. Confirm consent - "In the setting of the current Covid19 crisis, you are scheduled for a (phone or video) visit with your provider on (date) at (time).  Just as we do with many in-office visits, in order for you to participate in this visit, we must obtain consent.  If you'd like, I can send this to your mychart (if signed up) or email for you to review.  Otherwise, I can obtain your verbal consent now.  All virtual visits are billed to your insurance company just like a normal visit would be.  By agreeing to a virtual visit, we'd like you to understand that the technology does not allow for your provider to perform an examination, and thus may limit your provider's ability to fully assess your condition. If your provider identifies any concerns that need to be evaluated in person, we will make arrangements to do so.  Finally, though the technology is pretty good, we cannot assure that it will always work on either your or our end, and in the setting of a video visit, we may have to convert it to a phone-only visit.  In either situation, we cannot ensure that we have a secure connection.  Are you willing to proceed?" STAFF: Did the patient verbally acknowledge consent to telehealth visit? Document  YES/NO here: Yes  4. Advise patient to be prepared - "Two hours prior to your appointment, go ahead and check your blood pressure, pulse, oxygen saturation, and your weight (if you have the equipment to check those) and write them all down. When your visit starts, your provider will ask you for this information. If you have an Apple Watch or Kardia device, please plan to have heart rate information ready on the day of your appointment. Please have a pen and paper handy nearby the day of the visit as well."  5. Give patient instructions for MyChart download to smartphone OR [email or web address] as below if video visit (depending on what platform provider is using)  6. Inform patient they will receive a phone call 15 minutes prior to their appointment time (may be from unknown caller ID) so they should be prepared to answer    TELEPHONE CALL NOTE  Dana [name] has been deemed a candidate for a follow-up tele-health visit to limit community exposure during the Covid-19 pandemic. I spoke with the patient via phone to ensure availability of phone/video source, confirm preferred email & phone number, and discuss instructions and expectations.  I reminded Dana [name] to be prepared with any vital sign and/or heart rhythm information that could potentially be obtained via home monitoring, at the time of her visit. I reminded Dana [name] to expect a phone call prior to her visit.  Terry L Goins 10/25/2018 3:06 PM

## 2018-10-29 ENCOUNTER — Other Ambulatory Visit: Payer: Self-pay

## 2018-10-29 ENCOUNTER — Ambulatory Visit (HOSPITAL_COMMUNITY)
Admission: RE | Admit: 2018-10-29 | Discharge: 2018-10-29 | Disposition: A | Discharge status: Home / Self Care | Payer: Medicare Other | Source: Ambulatory Visit | Attending: Cardiology | Admitting: Cardiology

## 2018-10-29 DIAGNOSIS — I421 Obstructive hypertrophic cardiomyopathy: Secondary | ICD-10-CM

## 2018-10-29 NOTE — Progress Notes (Signed)
*  PRELIMINARY RESULTS* Echocardiogram 2D Echocardiogram has been performed.  Stacey Drain 10/29/2018, 10:46 AM

## 2018-10-30 ENCOUNTER — Other Ambulatory Visit (HOSPITAL_COMMUNITY): Payer: Self-pay | Admitting: Family Medicine

## 2018-10-30 DIAGNOSIS — Z1231 Encounter for screening mammogram for malignant neoplasm of breast: Secondary | ICD-10-CM

## 2018-11-03 NOTE — Progress Notes (Signed)
Virtual Visit via Telephone Note   This visit type was conducted due to national recommendations for restrictions regarding the COVID-19 Pandemic (e.g. social distancing) in an effort to limit this patient's exposure and mitigate transmission in our community.  Due to her co-morbid illnesses, this patient is at least at moderate risk for complications without adequate follow up.  This format is felt to be most appropriate for this patient at this time.  The patient did not have access to video technology/had technical difficulties with video requiring transitioning to audio format only (telephone).  All issues noted in this document were discussed and addressed.  No physical exam could be performed with this format.  Please refer to the patient's chart for her  consent to telehealth for [facility].   Date:  11/06/2018   ID:  Dana [name], DOB 29-Jan-1941, MRN [id number]  Patient Location: Home Provider Location: Home  PCP:  Pearson GrippeKim, James, MD  Cardiologist:  Nona DellSamuel Teylor Wolven, MD  Evaluation Performed:  Follow-Up Visit  Chief Complaint:   Cardiac follow-up  History of Present Illness:    Dana [name] is a 78 y.o. female [place]  She did not have video access and contact was made by phone.  I spoke with her son who translates for her as she does not speak [place].  Through his interpretation she does not report any exertional chest pain, has had no dizziness or syncope.  She indicated that her breathing was better with routine ADLs.  An echocardiogram from early May is outlined below.  LVEF greater than 65% with LVH and moderate to severe range.  Peak LVOT gradient 100 mmHg consistent with her diagnosis of hypertrophic obstructive cardiomyopathy. Mildly stenotic aortic valve although majority of gradient related to LVOT obstruction.  Mild mitral regurgitation.  In the absence of any progressive symptomatology as noted above, we will  continue with observation.  We discussed follow-up lab work.  I did review interval lab work from March, we will add a follow-up fasting lipid panel for her next visit.  We reviewed her medications.  The patient does not have symptoms concerning for COVID-19 infection (fever, chills, cough, or new shortness of breath).  Her son indicates that she has been social distancing.  He does the shopping for her.   Past Medical History:  Diagnosis Date  . CAD (coronary artery disease)    a. s/p DES to OM1 in 02/2017  . Essential hypertension   . Gout   . HOCM (hypertrophic obstructive cardiomyopathy) (HCC)   . Hyperlipidemia   . Rheumatoid arthritis [facility](HCC)    Past Surgical History:  Procedure Laterality Date  . CHOLECYSTECTOMY N/A 08/17/2018   Procedure: LAPAROSCOPIC CHOLECYSTECTOMY;  Surgeon: Franky MachoJenkins, Mark, MD;  Location: [facility];  Service: General;  Laterality: N/A;  . CORONARY BALLOON ANGIOPLASTY N/A 03/03/2017   Procedure: CORONARY BALLOON ANGIOPLASTY;  Surgeon: Marykay LexHarding, David W, MD;  [facility];  Service: Cardiovascular;  Laterality: N/A;  . CORONARY STENT INTERVENTION N/A 03/03/2017   Procedure: CORONARY STENT INTERVENTION;  Surgeon: Marykay LexHarding, David W, MD;  [facility];  Service: Cardiovascular;  Laterality: N/A;  . LEFT HEART CATH AND CORONARY ANGIOGRAPHY N/A 03/02/2017   Procedure: LEFT HEART CATH AND CORONARY ANGIOGRAPHY;  Surgeon: Tonny Bollmanooper, Michael, MD;  [facility];  Service: Cardiovascular;  Laterality: N/A;     Current Meds  Medication Sig  . allopurinol (ZYLOPRIM) 300 MG tablet Take  300 mg by mouth daily.   [name] aspirin EC 81 MG tablet Take 81 mg by mouth daily.  [name] atorvastatin (LIPITOR) 10 MG tablet Take 10 mg by mouth daily.  . calcium gluconate 500 MG tablet Take 1 tablet by mouth 2 (two) times daily.  . carvedilol (COREG) 6.25 MG tablet Take 1 tablet (6.25 mg total) by mouth 2 (two) times daily with a meal.  . Cholecalciferol (VITAMIN  D3) 50 MCG (2000 UT) TABS Take 1 tablet by mouth daily.  . clopidogrel (PLAVIX) 75 MG tablet Take 75 mg by mouth daily.  [name] ezetimibe (ZETIA) 10 MG tablet Take 10 mg by mouth daily.  . famotidine (PEPCID) 20 MG tablet Take 1 tablet (20 mg total) by mouth 2 (two) times daily.  . ferrous sulfate (FEROSUL) 325 (65 FE) MG tablet Take 325 mg by mouth daily with breakfast.  . furosemide (LASIX) 40 MG tablet Take 1 tablet (40 mg total) by mouth daily as needed for fluid or edema.  . methotrexate 2.5 MG tablet Take 20 mg by mouth once a week. On Thursday  . nitroGLYCERIN (NITROSTAT) 0.4 MG SL tablet Place 1 tablet (0.4 mg total) under the tongue every 5 (five) minutes as needed for chest pain.  . pantoprazole (PROTONIX) 40 MG tablet Take 40 mg by mouth daily.  . verapamil (CALAN-SR) 240 MG CR tablet Take 1 tablet (240 mg total) by mouth daily.  . Vitamin D, Ergocalciferol, (DRISDOL) 1.25 MG (50000 UT) CAPS capsule 50,000 Units every 7 (seven) days.   . [DISCONTINUED] furosemide (LASIX) 40 MG tablet Take 1 tablet (40 mg total) by mouth every other day. (Patient taking differently: Take 40 mg by mouth daily as needed for fluid or edema. )     Allergies:   Patient has no known allergies.   Social History   Tobacco Use  . Smoking status: Never Smoker  . Smokeless tobacco: Never Used  Substance Use Topics  . Alcohol use: No  . Drug use: No     Family Hx: The patient's family history includes Alcohol abuse in her father; Hypertension in her mother; Liver disease in her mother.  ROS:   Please see the history of present illness.    All other systems reviewed and are negative.   Prior CV studies:   The following studies were reviewed today:  Echocardiogram 10/29/2018:  1. The left ventricle has hyperdynamic systolic function, with an ejection fraction of >65%. The cavity size was normal. There is moderately increased left ventricular wall thickness. Left ventricular diastolic Doppler parameters are  consistent with  impaired relaxation.  2. LV is thickened, moderate to severely The LVOT is narrow. There is systolic anterior motion of the anterior mitral leaflet(SAM) There is near cavity obliteration With Valsalva maneuver, there isobstruction to outlfow with peak gradient of 100 mm Hg  COnsistent with IHSS.  3. The right ventricle has normal systolic function. The cavity was normal. There is no increase in right ventricular wall thickness.  4. Left atrial size was moderately dilated.  5. Right atrial size was mildly dilated.  6. The mitral valve is abnormal. Mild thickening of the mitral valve leaflet. There is mild mitral annular calcification present.  7. The aortic valve is tricuspid. Moderate thickening of the aortic valve. Moderate calcification of the aortic valve.  8. The aortic valve is thickened, calcified Peak and mean gradients through the LV / LVOT/AV are 50 and 28 mm Hg respectively By 2 D imaging with hyperdynamic LV,  narrow LVOT, near cavity oblitaration, some of this is coming from below the valve By 2 D  imaging, AV appears more mildly narrowed (not moderate).  9. The aortic root is normal in size and structure. 10. The interatrial septum was not assessed.  Cardiac catheterization and PCI 03/14/2017:  Ost LM lesion, 40 %stenosed.  1st Mrg lesion, 90 %stenosed.  A STENT SIERRA 3.00 X 12 MM drug eluting stent was successfully placed.  Post intervention, there is a 0% residual stenosis.  Lat 1st Mrg lesion, 60 %stenosed. Bifurcation lesion jailed by the initial stent  Post intervention - rescue PTCA only, there is a 20% residual stenosis.  Successful bifurcation PCI of large OM1 with major branch of OM1 PTCA. A Xience [place] DES stent was used for the main branch and then PTCA was performed through the stent into the side branch for ostial stenosis.  1. Severe single-vessel coronary artery disease involving a large first obtuse marginal branch of the left dominant  circumflex 2. Moderate LAD stenosis 3. Small nondominant RCA with no significant obstruction 4. Mild to moderate left main stenosis 5. Known hypertrophic cardiomyopathy  Recommend PCI of the obtuse marginal. 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 The patient will be loaded with Plavix. Will schedule her PCI for tomorrow. Would use femoral artery access and consider either a 5 [place] guide catheter or a guide catheter with sideholes. Plan discussed at length with the patient's daughter. Note the patient is non-English speaking.  Labs/Other Tests and Data Reviewed:    EKG:  An ECG dated 06/27/2018 was personally reviewed today and demonstrated:  Sinus rhythm with LVH, anteroseptal Q waves, diffuse repolarization abnormalities.  Recent Labs: 01/26/2018: B Natriuretic Peptide 1,106.0 01/29/2018: Magnesium 1.9 08/13/2018: ALT 12; BUN 18; Creatinine, Ser 0.79; Hemoglobin 13.0; Platelets 236; Potassium 3.8; Sodium 139   Recent Lipid Panel Lab Results  Component Value Date/Time   CHOL 123 09/17/2017 06:27 AM   TRIG 69 09/17/2017 06:27 AM   HDL 49 09/17/2017 06:27 AM   CHOLHDL 2.5 09/17/2017 06:27 AM   LDLCALC 60 09/17/2017 06:27 AM    Wt Readings from Last 3 Encounters:  08/30/18 173 lb 9.6 oz (78.7 kg)  08/13/18 173 lb (78.5 kg)  07/15/18 174 lb 2.6 oz (79 kg)     Objective:    Vital Signs:  There were no vitals taken for this visit.   She did not have the ability to get vital signs checked today.  Last clinical encounter in March indicates blood pressure of 120/70 with heart rate 74. I did hear her speaking in the background to her son, she was not breathless.  ASSESSMENT & PLAN:    1.  Hypertrophic obstructive cardiomyopathy, follow-up echocardiogram noted above.  She is symptomatically stable at this time, specifically no exertional chest pain or increasing shortness of breath, no palpitations or syncope.  Plan to continue AV  nodal blockers and observation.  2.  CAD status post DES to the first obtuse marginal in September 2018.  She continues on antiplatelet regimen and statin.  3.  Diastolic dysfunction with intermittent leg swelling.  She has Lasix that she uses infrequently.  4.  Mixed hyperlipidemia, continues on Lipitor and Zetia.  We will follow-up with fasting lipid panel for her next visit, last LDL was 60.  COVID-19 Education: The signs and symptoms of COVID-19 were discussed with the patient and how to seek care for testing (follow up  with PCP or arrange E-visit).  The importance of social distancing was discussed today.  Time:   Today, I have spent 6 minutes with the patient with telehealth technology discussing the above problems.     Medication Adjustments/Labs and Tests Ordered: Current medicines are reviewed at length with the patient today.  Concerns regarding medicines are outlined above.   Tests Ordered: Orders Placed This Encounter  Procedures  . Lipid Profile    Medication Changes: Meds ordered this encounter  Medications  . furosemide (LASIX) 40 MG tablet    Sig: Take 1 tablet (40 mg total) by mouth daily as needed for fluid or edema.    Dispense:  90 tablet    Refill:  1    Disposition:  Follow up 6 months in the [place] office.  Signed, Nona Dell, MD  11/06/2018 9:12 AM    [facility]

## 2018-11-06 ENCOUNTER — Telehealth (INDEPENDENT_AMBULATORY_CARE_PROVIDER_SITE_OTHER): Payer: Medicare Other | Admitting: Cardiology

## 2018-11-06 ENCOUNTER — Encounter: Payer: Self-pay | Admitting: Cardiology

## 2018-11-06 DIAGNOSIS — Z7189 Other specified counseling: Secondary | ICD-10-CM | POA: Diagnosis not present

## 2018-11-06 DIAGNOSIS — I25119 Atherosclerotic heart disease of native coronary artery with unspecified angina pectoris: Secondary | ICD-10-CM | POA: Diagnosis not present

## 2018-11-06 DIAGNOSIS — E782 Mixed hyperlipidemia: Secondary | ICD-10-CM | POA: Diagnosis not present

## 2018-11-06 DIAGNOSIS — I421 Obstructive hypertrophic cardiomyopathy: Secondary | ICD-10-CM

## 2018-11-06 MED ORDER — FUROSEMIDE 40 MG PO TABS: 40.0000 mg | ORAL_TABLET | Freq: Every day | ORAL | 1 refills | Status: DC | PRN

## 2018-11-06 NOTE — Patient Instructions (Signed)
Medication Instructions: Your physician recommends that you continue on your current medications as directed. Please refer to the Current Medication list given to you today.   Labwork: FASTING Lipid JUST before next visitin 6 months  Procedures/Testing: None today  Follow-Up: 6 months with Dr.McDowell  Any Additional Special Instructions Will Be Listed Below (If Applicable).     If you need a refill on your cardiac medications before your next appointment, please call your pharmacy.

## 2018-11-22 ENCOUNTER — Other Ambulatory Visit: Payer: Self-pay | Admitting: Cardiology

## 2019-01-11 ENCOUNTER — Ambulatory Visit (HOSPITAL_COMMUNITY): Payer: Medicare Other

## 2019-01-30 ENCOUNTER — Ambulatory Visit (HOSPITAL_COMMUNITY)
Admission: RE | Admit: 2019-01-30 | Discharge: 2019-01-30 | Disposition: A | Discharge status: Home / Self Care | Payer: Medicare Other | Source: Ambulatory Visit | Attending: Family Medicine | Admitting: Family Medicine

## 2019-01-30 ENCOUNTER — Other Ambulatory Visit: Payer: Self-pay

## 2019-01-30 DIAGNOSIS — Z1231 Encounter for screening mammogram for malignant neoplasm of breast: Secondary | ICD-10-CM | POA: Insufficient documentation

## 2019-05-15 ENCOUNTER — Telehealth: Payer: Self-pay | Admitting: Cardiology

## 2019-05-15 NOTE — Telephone Encounter (Signed)

## 2019-05-16 NOTE — Progress Notes (Signed)
Virtual Visit via Telephone Note   This visit type was conducted due to national recommendations for restrictions regarding the COVID-19 Pandemic (e.g. social distancing) in an effort to limit this patient's exposure and mitigate transmission in our community.  Due to her co-morbid illnesses, this patient is at least at moderate risk for complications without adequate follow up.  This format is felt to be most appropriate for this patient at this time.  The patient did not have access to video technology/had technical difficulties with video requiring transitioning to audio format only (telephone).  All issues noted in this document were discussed and addressed.  No physical exam could be performed with this format.  Please refer to the patient's chart for her  consent to telehealth for [facility].   Date:  05/17/2019   ID:  Dana [name], DOB [birth year], MRN [id number]  Patient Location: Home Provider Location: Office  PCP:  Pearson GrippeKim, James, MD  Cardiologist:  Nona DellSamuel Nikan Ellingson, MD Electrophysiologist:  None   Evaluation Performed:  Follow-Up Visit  Chief Complaint:  Cardiac follow-up  History of Present Illness:    Dana [name] is a 78 y.o. female native of [place] last assessed via telehealth encounter in May.  I spoke with her son who translated for her in the same room.  She has been doing well, no exertional chest pain or unusual breathlessness with typical ADLs.  She has been staying in a house mostly during the pandemic, her son goes out to get groceries.  I went over her medications which are outlined below and stable from a cardiac perspective.  She does not report any obvious intolerances.  She had lab work done recently with [facility], we are requesting these results.  Blood pressure was mildly elevated today and they will continue to track this periodically at home.  The patient does not have symptoms concerning for COVID-19 infection  (fever, chills, cough, or new shortness of breath).    Past Medical History:  Diagnosis Date  . CAD (coronary artery disease)    a. s/p DES to OM1 in 02/2017  . Essential hypertension   . Gout   . HOCM (hypertrophic obstructive cardiomyopathy) (HCC)   . Hyperlipidemia   . Rheumatoid arthritis [facility](HCC)    Past Surgical History:  Procedure Laterality Date  . CHOLECYSTECTOMY N/A 08/17/2018   Procedure: LAPAROSCOPIC CHOLECYSTECTOMY;  Surgeon: Franky MachoJenkins, Mark, MD;  Location: [facility];  Service: General;  Laterality: N/A;  . CORONARY BALLOON ANGIOPLASTY N/A 03/03/2017   Procedure: CORONARY BALLOON ANGIOPLASTY;  Surgeon: Marykay LexHarding, David W, MD;  [facility];  Service: Cardiovascular;  Laterality: N/A;  . CORONARY STENT INTERVENTION N/A 03/03/2017   Procedure: CORONARY STENT INTERVENTION;  Surgeon: Marykay LexHarding, David W, MD;  [facility];  Service: Cardiovascular;  Laterality: N/A;  . LEFT HEART CATH AND CORONARY ANGIOGRAPHY N/A 03/02/2017   Procedure: LEFT HEART CATH AND CORONARY ANGIOGRAPHY;  Surgeon: Tonny Bollmanooper, Michael, MD;  [facility];  Service: Cardiovascular;  Laterality: N/A;     Current Meds  Medication Sig  . allopurinol (ZYLOPRIM) 300 MG tablet Take 300 mg by mouth daily.   [name]. aspirin EC 81 MG tablet Take 81 mg by mouth daily.  [name]. atorvastatin (LIPITOR) 10 MG tablet Take 10 mg by mouth daily.  . calcium gluconate 500 MG tablet Take 1 tablet by mouth 2 (two) times daily.  . carvedilol (COREG) 6.25 MG tablet Take 1 tablet (  6.25 mg total) by mouth 2 (two) times daily with a meal.  . Cholecalciferol (VITAMIN D3) 50 MCG (2000 UT) TABS Take 1 tablet by mouth daily.  . clopidogrel (PLAVIX) 75 MG tablet TAKE 1 TABLET BY MOUTH ONCE DAILY TO PREVENT STROKE/HEART ATTACK.  [name] ezetimibe (ZETIA) 10 MG tablet Take 10 mg by mouth daily.  . famotidine (PEPCID) 20 MG tablet Take 1 tablet (20 mg total) by mouth 2 (two) times daily.  . ferrous sulfate (FEROSUL) 325 (65 FE)  MG tablet Take 325 mg by mouth daily with breakfast.  . furosemide (LASIX) 40 MG tablet Take 1 tablet (40 mg total) by mouth daily as needed for fluid or edema.  . methotrexate 2.5 MG tablet Take 20 mg by mouth once a week. On Thursday  . nitroGLYCERIN (NITROSTAT) 0.4 MG SL tablet Place 1 tablet (0.4 mg total) under the tongue every 5 (five) minutes as needed for chest pain.  . pantoprazole (PROTONIX) 40 MG tablet Take 40 mg by mouth daily.  . verapamil (CALAN-SR) 240 MG CR tablet Take 1 tablet (240 mg total) by mouth daily.  . Vitamin D, Ergocalciferol, (DRISDOL) 1.25 MG (50000 UT) CAPS capsule 50,000 Units every 7 (seven) days.      Allergies:   Patient has no known allergies.   Social History   Tobacco Use  . Smoking status: Never Smoker  . Smokeless tobacco: Never Used  Substance Use Topics  . Alcohol use: No  . Drug use: No     Family Hx: The patient's family history includes Alcohol abuse in her father; Hypertension in her mother; Liver disease in her mother.  ROS:   Please see the history of present illness. All other systems reviewed and are negative.   Prior CV studies:   The following studies were reviewed today:  Echocardiogram 10/29/2018: 1. The left ventricle has hyperdynamic systolic function, with an ejection fraction of >65%. The cavity size was normal. There is moderately increased left ventricular wall thickness. Left ventricular diastolic Doppler parameters are consistent with  impaired relaxation. 2. LV is thickened, moderate to severely The LVOT is narrow. There is systolic anterior motion of the anterior mitral leaflet(SAM) There is near cavity obliteration With Valsalva maneuver, there isobstruction to outlfow with peak gradient of 100 mm Hg  COnsistent with IHSS. 3. The right ventricle has normal systolic function. The cavity was normal. There is no increase in right ventricular wall thickness. 4. Left atrial size was moderately dilated. 5. Right  atrial size was mildly dilated. 6. The mitral valve is abnormal. Mild thickening of the mitral valve leaflet. There is mild mitral annular calcification present. 7. The aortic valve is tricuspid. Moderate thickening of the aortic valve. Moderate calcification of the aortic valve. 8. The aortic valve is thickened, calcified Peak and mean gradients through the LV / LVOT/AV are 50 and 28 mm Hg respectively By 2 D imaging with hyperdynamic LV, narrow LVOT, near cavity oblitaration, some of this is coming from below the valve By 2 D  imaging, AV appears more mildly narrowed (not moderate). 9. The aortic root is normal in size and structure. 10. The interatrial septum was not assessed.  Cardiac catheterization and PCI 03/14/2017:  Ost LM lesion, 40 %stenosed.  1st Mrg lesion, 90 %stenosed.  A STENT SIERRA 3.00 X 12 MM drug eluting stent was successfully placed.  Post intervention, there is a 0% residual stenosis.  Lat 1st Mrg lesion, 60 %stenosed. Bifurcation lesion jailed by the initial stent  Post intervention - rescue PTCA only, there is a 20% residual stenosis.  Successful bifurcation PCI of large OM1 with major branch of OM1 PTCA. A Xience [place] DES stent was used for the main branch and then PTCA was performed through the stent into the side branch for ostial stenosis.  1. Severe single-vessel coronary artery disease involving a large first obtuse marginal branch of the left dominant circumflex 2. Moderate LAD stenosis 3. Small nondominant RCA with no significant obstruction 4. Mild to moderate left main stenosis 5. Known hypertrophic cardiomyopathy  Recommend PCI of the obtuse marginal. 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 The patient will be loaded with Plavix. Will schedule her PCI for tomorrow. Would use femoral artery access and consider either a 5 [place] guide catheter or a guide catheter with sideholes. Plan  discussed at length with the patient's daughter. Note the patient is non-English speaking.  Labs/Other Tests and Data Reviewed:    EKG:  An ECG dated 06/27/2018 was personally reviewed today and demonstrated:  Sinus rhythm with LVH, anteroseptal Q waves, diffuse repolarization abnormalities.  Recent Labs: 08/13/2018: ALT 12; BUN 18; Creatinine, Ser 0.79; Hemoglobin 13.0; Platelets 236; Potassium 3.8; Sodium 139   Recent Lipid Panel Lab Results  Component Value Date/Time   CHOL 123 09/17/2017 06:27 AM   TRIG 69 09/17/2017 06:27 AM   HDL 49 09/17/2017 06:27 AM   CHOLHDL 2.5 09/17/2017 06:27 AM   LDLCALC 60 09/17/2017 06:27 AM    Wt Readings from Last 3 Encounters:  05/17/19 186 lb (84.4 kg)  08/30/18 173 lb 9.6 oz (78.7 kg)  08/13/18 173 lb (78.5 kg)     Objective:    Vital Signs:  BP (!) 147/83   Pulse 84   Ht 5\' 2"  (1.575 m)   Wt 186 lb (84.4 kg)   BMI 34.02 kg/m    Patient communicated through her son as interpreter.  ASSESSMENT & PLAN:    1.  Hypertrophic obstructive cardiomyopathy, clinically stable at this time.  Plan to continue medical therapy including Coreg and Calan SR.  She has had no palpitations or syncope.  2.  CAD status post DES to the first obtuse marginal in September 2018.  Continue dual antiplatelet therapy and statin.  No reported bleeding problems.  3.  Mixed hyperlipidemia, on Lipitor and Zetia.  Lipids have been followed by PCP, requesting interval lab work.  Her last LDL was 60.  COVID-19 Education: The signs and symptoms of COVID-19 were discussed with the patient and how to seek care for testing (follow up with PCP or arrange E-visit).  The importance of social distancing was discussed today.  Time:   Today, I have spent 6 minutes with the patient with telehealth technology discussing the above problems.     Medication Adjustments/Labs and Tests Ordered: Current medicines are reviewed at length with the patient today.  Concerns regarding  medicines are outlined above.   Tests Ordered: No orders of the defined types were placed in this encounter.   Medication Changes: No orders of the defined types were placed in this encounter.   Follow Up:  Either In Person or Virtual 6 months.  Signed, Rozann Lesches, MD  05/17/2019 10:37 AM    [place]

## 2019-05-17 ENCOUNTER — Telehealth (INDEPENDENT_AMBULATORY_CARE_PROVIDER_SITE_OTHER): Payer: Medicare Other | Admitting: Cardiology

## 2019-05-17 ENCOUNTER — Encounter: Payer: Self-pay | Admitting: Cardiology

## 2019-05-17 VITALS — BP 147/83 | HR 84 | Ht 62.0 in | Wt 186.0 lb

## 2019-05-17 DIAGNOSIS — E782 Mixed hyperlipidemia: Secondary | ICD-10-CM | POA: Diagnosis not present

## 2019-05-17 DIAGNOSIS — I421 Obstructive hypertrophic cardiomyopathy: Secondary | ICD-10-CM | POA: Diagnosis not present

## 2019-05-17 DIAGNOSIS — I10 Essential (primary) hypertension: Secondary | ICD-10-CM

## 2019-05-17 DIAGNOSIS — I25119 Atherosclerotic heart disease of native coronary artery with unspecified angina pectoris: Secondary | ICD-10-CM | POA: Diagnosis not present

## 2019-05-17 NOTE — Patient Instructions (Signed)
Medication Instructions:  Your physician recommends that you continue on your current medications as directed. Please refer to the Current Medication list given to you today.  *If you need a refill on your cardiac medications before your next appointment, please call your pharmacy*  Lab Work: None today If you have labs (blood work) drawn today and your tests are completely normal, you will receive your results only by: [name] MyChart Message (if you have MyChart) OR . A paper copy in the mail If you have any lab test that is abnormal or we need to change your treatment, we will call you to review the results.  Testing/Procedures: None today  Follow-Up: At [facility], you and your health needs are our priority.  As part of our continuing mission to provide you with exceptional heart care, we have created designated Provider Care Teams.  These Care Teams include your primary Cardiologist (physician) and Advanced Practice Providers (APPs -  Physician Assistants and Nurse Practitioners) who all work together to provide you with the care you need, when you need it.  Your next appointment:   12 month(s)  The format for your next appointment:   Either In Person or Virtual  Provider:   Rozann Lesches, MD  Other Instructions None     Thank you for choosing [facility] !

## 2019-08-05 ENCOUNTER — Observation Stay (HOSPITAL_COMMUNITY)
Admission: EM | Admit: 2019-08-05 | Discharge: 2019-08-07 | Disposition: A | Discharge status: Home / Self Care | Payer: Medicare Other | Attending: Family Medicine | Admitting: Family Medicine

## 2019-08-05 ENCOUNTER — Encounter (HOSPITAL_COMMUNITY): Payer: Self-pay | Admitting: *Deleted

## 2019-08-05 ENCOUNTER — Other Ambulatory Visit: Payer: Self-pay

## 2019-08-05 DIAGNOSIS — I2510 Atherosclerotic heart disease of native coronary artery without angina pectoris: Secondary | ICD-10-CM | POA: Diagnosis not present

## 2019-08-05 DIAGNOSIS — R0602 Shortness of breath: Principal | ICD-10-CM | POA: Insufficient documentation

## 2019-08-05 DIAGNOSIS — Z79899 Other long term (current) drug therapy: Secondary | ICD-10-CM | POA: Insufficient documentation

## 2019-08-05 DIAGNOSIS — I959 Hypotension, unspecified: Secondary | ICD-10-CM | POA: Diagnosis not present

## 2019-08-05 DIAGNOSIS — E785 Hyperlipidemia, unspecified: Secondary | ICD-10-CM | POA: Insufficient documentation

## 2019-08-05 DIAGNOSIS — I5033 Acute on chronic diastolic (congestive) heart failure: Secondary | ICD-10-CM | POA: Diagnosis not present

## 2019-08-05 DIAGNOSIS — I110 Hypertensive heart disease with heart failure: Secondary | ICD-10-CM | POA: Diagnosis not present

## 2019-08-05 DIAGNOSIS — D649 Anemia, unspecified: Secondary | ICD-10-CM | POA: Diagnosis present

## 2019-08-05 DIAGNOSIS — R040 Epistaxis: Secondary | ICD-10-CM | POA: Insufficient documentation

## 2019-08-05 DIAGNOSIS — Z20822 Contact with and (suspected) exposure to covid-19: Secondary | ICD-10-CM | POA: Insufficient documentation

## 2019-08-05 DIAGNOSIS — R079 Chest pain, unspecified: Secondary | ICD-10-CM | POA: Diagnosis present

## 2019-08-05 DIAGNOSIS — Z7982 Long term (current) use of aspirin: Secondary | ICD-10-CM | POA: Insufficient documentation

## 2019-08-05 DIAGNOSIS — I10 Essential (primary) hypertension: Secondary | ICD-10-CM | POA: Diagnosis present

## 2019-08-05 DIAGNOSIS — I422 Other hypertrophic cardiomyopathy: Secondary | ICD-10-CM | POA: Diagnosis not present

## 2019-08-05 DIAGNOSIS — I421 Obstructive hypertrophic cardiomyopathy: Secondary | ICD-10-CM | POA: Diagnosis present

## 2019-08-05 DIAGNOSIS — Z7902 Long term (current) use of antithrombotics/antiplatelets: Secondary | ICD-10-CM | POA: Diagnosis not present

## 2019-08-05 DIAGNOSIS — R0789 Other chest pain: Secondary | ICD-10-CM | POA: Diagnosis not present

## 2019-08-05 DIAGNOSIS — M069 Rheumatoid arthritis, unspecified: Secondary | ICD-10-CM | POA: Diagnosis present

## 2019-08-05 NOTE — ED Triage Notes (Signed)
Pt brought in by [facility] for c/o chest pain and hypotension; pt has been c/o feeling sob tonight; son states pt has had a nose bleed all day

## 2019-08-06 ENCOUNTER — Other Ambulatory Visit (HOSPITAL_COMMUNITY): Payer: Medicare Other

## 2019-08-06 ENCOUNTER — Emergency Department (HOSPITAL_COMMUNITY): Payer: Medicare Other

## 2019-08-06 DIAGNOSIS — R0789 Other chest pain: Secondary | ICD-10-CM | POA: Diagnosis not present

## 2019-08-06 DIAGNOSIS — I959 Hypotension, unspecified: Secondary | ICD-10-CM

## 2019-08-06 DIAGNOSIS — I2510 Atherosclerotic heart disease of native coronary artery without angina pectoris: Secondary | ICD-10-CM | POA: Diagnosis not present

## 2019-08-06 DIAGNOSIS — R079 Chest pain, unspecified: Secondary | ICD-10-CM

## 2019-08-06 DIAGNOSIS — D649 Anemia, unspecified: Secondary | ICD-10-CM | POA: Diagnosis not present

## 2019-08-06 DIAGNOSIS — I422 Other hypertrophic cardiomyopathy: Secondary | ICD-10-CM

## 2019-08-06 DIAGNOSIS — R040 Epistaxis: Secondary | ICD-10-CM

## 2019-08-06 DIAGNOSIS — R0609 Other forms of dyspnea: Secondary | ICD-10-CM | POA: Diagnosis not present

## 2019-08-06 DIAGNOSIS — R0602 Shortness of breath: Secondary | ICD-10-CM | POA: Diagnosis not present

## 2019-08-06 DIAGNOSIS — I10 Essential (primary) hypertension: Secondary | ICD-10-CM | POA: Diagnosis not present

## 2019-08-06 LAB — COMPREHENSIVE METABOLIC PANEL
ALT: 14 U/L (ref 0–44)
AST: 16 U/L (ref 15–41)
Albumin: 3.2 g/dL — ABNORMAL LOW (ref 3.5–5.0)
Alkaline Phosphatase: 43 U/L (ref 38–126)
Anion gap: 9 (ref 5–15)
BUN: 17 mg/dL (ref 8–23)
CO2: 21 mmol/L — ABNORMAL LOW (ref 22–32)
Calcium: 7.7 mg/dL — ABNORMAL LOW (ref 8.9–10.3)
Chloride: 106 mmol/L (ref 98–111)
Creatinine, Ser: 0.93 mg/dL (ref 0.44–1.00)
GFR calc Af Amer: >60 mL/min (ref >60)
GFR calc non Af Amer: 59 mL/min — ABNORMAL LOW (ref >60)
Glucose, Bld: 125 mg/dL — ABNORMAL HIGH (ref 70–99)
Potassium: 4 mmol/L (ref 3.5–5.1)
Sodium: 136 mmol/L (ref 135–145)
Total Bilirubin: 0.7 mg/dL (ref 0.3–1.2)
Total Protein: 6 g/dL — ABNORMAL LOW (ref 6.5–8.1)

## 2019-08-06 LAB — TROPONIN I (HIGH SENSITIVITY)
Troponin I (High Sensitivity): 234 ng/L (ref <18)
Troponin I (High Sensitivity): 225 ng/L (ref <18)
Troponin I (High Sensitivity): 216 ng/L (ref <18)
Troponin I (High Sensitivity): 198 ng/L (ref <18)

## 2019-08-06 LAB — CBC WITH DIFFERENTIAL/PLATELET
Abs Immature Granulocytes: 0.03 10*3/uL (ref 0.00–0.07)
Basophils Absolute: 0 10*3/uL (ref 0.0–0.1)
Basophils Relative: 0 %
Eosinophils Absolute: 0.1 10*3/uL (ref 0.0–0.5)
Eosinophils Relative: 2 %
HCT: 32.9 % — ABNORMAL LOW (ref 36.0–46.0)
Hemoglobin: 10.7 g/dL — ABNORMAL LOW (ref 12.0–15.0)
Immature Granulocytes: 0 %
Lymphocytes Relative: 16 %
Lymphs Abs: 1.2 10*3/uL (ref 0.7–4.0)
MCH: 32 pg (ref 26.0–34.0)
MCHC: 32.5 g/dL (ref 30.0–36.0)
MCV: 98.5 fL (ref 80.0–100.0)
Monocytes Absolute: 0.8 10*3/uL (ref 0.1–1.0)
Monocytes Relative: 10 %
Neutro Abs: 5.5 10*3/uL (ref 1.7–7.7)
Neutrophils Relative %: 72 %
Platelets: 175 10*3/uL (ref 150–400)
RBC: 3.34 MIL/uL — ABNORMAL LOW (ref 3.87–5.11)
RDW: 14.5 % (ref 11.5–15.5)
WBC: 7.7 10*3/uL (ref 4.0–10.5)
nRBC: 0 % (ref 0.0–0.2)

## 2019-08-06 LAB — SAMPLE TO BLOOD BANK

## 2019-08-06 LAB — SARS CORONAVIRUS 2 (TAT 6-24 HRS): SARS Coronavirus 2: NEGATIVE

## 2019-08-06 LAB — BRAIN NATRIURETIC PEPTIDE: B Natriuretic Peptide: 250 pg/mL — ABNORMAL HIGH (ref 0.0–100.0)

## 2019-08-06 MED ORDER — ATORVASTATIN CALCIUM 10 MG PO TABS
10.0000 mg | ORAL_TABLET | Freq: Every day | ORAL | Status: DC
  Administered 2019-08-06 – 2019-08-07 (×2): 10 mg via ORAL
  Filled 2019-08-06 (×2): qty 1

## 2019-08-06 MED ORDER — FERROUS SULFATE 325 (65 FE) MG PO TABS
325.0000 mg | ORAL_TABLET | Freq: Every day | ORAL | Status: DC
  Administered 2019-08-06 – 2019-08-07 (×2): 325 mg via ORAL
  Filled 2019-08-06 (×2): qty 1

## 2019-08-06 MED ORDER — ONDANSETRON HCL 4 MG PO TABS: 4.0000 mg | ORAL_TABLET | Freq: Four times a day (QID) | ORAL | Status: DC | PRN

## 2019-08-06 MED ORDER — EZETIMIBE 10 MG PO TABS
10.0000 mg | ORAL_TABLET | Freq: Every day | ORAL | Status: DC
  Administered 2019-08-06 – 2019-08-07 (×2): 10 mg via ORAL
  Filled 2019-08-06 (×2): qty 1

## 2019-08-06 MED ORDER — ASPIRIN EC 81 MG PO TBEC
81.0000 mg | DELAYED_RELEASE_TABLET | Freq: Every day | ORAL | Status: DC
  Administered 2019-08-06 – 2019-08-07 (×2): 81 mg via ORAL
  Filled 2019-08-06 (×3): qty 1

## 2019-08-06 MED ORDER — SODIUM CHLORIDE 0.9 % IV BOLUS
1000.0000 mL | Freq: Once | INTRAVENOUS | Status: AC
  Administered 2019-08-06: 1000 mL via INTRAVENOUS

## 2019-08-06 MED ORDER — VERAPAMIL HCL ER 240 MG PO TBCR
240.0000 mg | EXTENDED_RELEASE_TABLET | Freq: Every day | ORAL | Status: DC
  Administered 2019-08-06 – 2019-08-07 (×2): 240 mg via ORAL
  Filled 2019-08-06 (×2): qty 1

## 2019-08-06 MED ORDER — SODIUM CHLORIDE 0.9 % IV SOLN: 250.0000 mL | INTRAVENOUS | Status: DC | PRN

## 2019-08-06 MED ORDER — ACETAMINOPHEN 650 MG RE SUPP: 650.0000 mg | Freq: Four times a day (QID) | RECTAL | Status: DC | PRN

## 2019-08-06 MED ORDER — SODIUM CHLORIDE 0.9% FLUSH
3.0000 mL | Freq: Two times a day (BID) | INTRAVENOUS | Status: DC
  Administered 2019-08-06 – 2019-08-07 (×3): 3 mL via INTRAVENOUS

## 2019-08-06 MED ORDER — OXYMETAZOLINE HCL 0.05 % NA SOLN
1.0000 | Freq: Once | NASAL | Status: AC
  Administered 2019-08-06: 1 via NASAL
  Filled 2019-08-06: qty 30

## 2019-08-06 MED ORDER — CALCIUM GLUCONATE 500 MG PO TABS
1.0000 | ORAL_TABLET | Freq: Two times a day (BID) | ORAL | Status: DC
  Filled 2019-08-06 (×3): qty 1

## 2019-08-06 MED ORDER — ALLOPURINOL 300 MG PO TABS
300.0000 mg | ORAL_TABLET | Freq: Every day | ORAL | Status: DC
  Administered 2019-08-06 – 2019-08-07 (×2): 300 mg via ORAL
  Filled 2019-08-06 (×2): qty 1

## 2019-08-06 MED ORDER — PANTOPRAZOLE SODIUM 40 MG PO TBEC
40.0000 mg | DELAYED_RELEASE_TABLET | Freq: Every day | ORAL | Status: DC
  Administered 2019-08-06 – 2019-08-07 (×2): 40 mg via ORAL
  Filled 2019-08-06 (×2): qty 1

## 2019-08-06 MED ORDER — CALCIUM CARBONATE ANTACID 500 MG PO CHEW
1.0000 | CHEWABLE_TABLET | Freq: Two times a day (BID) | ORAL | Status: DC
  Administered 2019-08-06 – 2019-08-07 (×3): 200 mg via ORAL
  Filled 2019-08-06 (×3): qty 1

## 2019-08-06 MED ORDER — CLOPIDOGREL BISULFATE 75 MG PO TABS
75.0000 mg | ORAL_TABLET | Freq: Every day | ORAL | Status: DC
  Administered 2019-08-06: 08:00:00 75 mg via ORAL
  Filled 2019-08-06: qty 1

## 2019-08-06 MED ORDER — ONDANSETRON HCL 4 MG/2ML IJ SOLN: 4.0000 mg | Freq: Four times a day (QID) | INTRAMUSCULAR | Status: DC | PRN

## 2019-08-06 MED ORDER — SODIUM CHLORIDE 0.9% FLUSH
3.0000 mL | INTRAVENOUS | Status: DC | PRN
  Administered 2019-08-06: 3 mL via INTRAVENOUS

## 2019-08-06 MED ORDER — ACETAMINOPHEN 325 MG PO TABS: 650.0000 mg | ORAL_TABLET | Freq: Four times a day (QID) | ORAL | Status: DC | PRN

## 2019-08-06 MED ORDER — METOPROLOL SUCCINATE ER 25 MG PO TB24
25.0000 mg | ORAL_TABLET | Freq: Every day | ORAL | Status: DC
  Administered 2019-08-07: 25 mg via ORAL
  Filled 2019-08-06: qty 1

## 2019-08-06 MED ORDER — CARVEDILOL 12.5 MG PO TABS
6.2500 mg | ORAL_TABLET | Freq: Two times a day (BID) | ORAL | Status: DC
  Administered 2019-08-06: 6.25 mg via ORAL
  Filled 2019-08-06 (×2): qty 1

## 2019-08-06 NOTE — Consult Note (Addendum)
Cardiology Consult    Patient ID: Dana [name]; [id number]; Mar 19, 1941   Admit date: 08/05/2019 Date of Consult: 08/06/2019  Primary Care Provider: [name], MD Primary Cardiologist: Nona Dell, MD   Patient Profile    Dana [name] is a 79 y.o. female with past medical history of CAD (s/p DES to OM1 in 02/2017), HOCM, HTN and HLD who is being seen today for the evaluation of chest pain and CHF in the setting of HOCM at the request of Dr. Sharl Ma.   History of Present Illness    Ms. Hafford most recently had a telehealth visit with Dr. Diona Browner in 04/2019 and denied any recent chest pain or dyspnea on exertion at that time.  Given no recent palpitations or syncope, she was continued on Coreg 6.25 mg twice daily and Verapamil 240 mg daily for HOCM. Was also on ASA 81 mg daily, Atorvastatin 10 mg daily, Plavix 75 mg daily, Zetia 10 mg daily and Lasix 40 mg as needed.  She presented to [facility] ED on 08/05/2019 for evaluation of worsening chest pain and dyspnea starting earlier that day. Also reported having a constant nosebleed throughout the day. In talking with the patient today, her son assists with translation per their request. He reports she has been suffering from frequent nosebleeds for the past several months and can go through 2 to 3 boxes of Kleenex when she has an episode. Last week, she was evaluated by PCP and had worsening lower extremity edema, therefore she was informed to take Lasix for several days. Her son believes she only took 1 dose of this but experienced frequent urination for 2 to 3 days afterwards. Since then, she has reported worsening fatigue and developed chest pain and dyspnea yesterday. Symptoms were occurring at rest and with exertion. She denies any associated orthopnea or PND. Weight has been stable on her home scales.   Initial labs showed WBC 7.7, Hgb 10.7 (previously 13.0 in 07/2018), platelets 175, Na+ 136, K+ 4.0 and creatinine 0.93. Initial HS Troponin 234  with repeat values at 225, 216 and 198. BNP 250. COVID pending.  Consistent with mild CHF. EKG shows NSR, HR 72 with LVH and TWI along the anterior and lateral leads which is similar to prior tracings from 06/2018.    She did receive a 1 L fluid bolus while in the ED given hypotension and concern for dehydration in the setting of HOCM. ASA was held on admission given epistaxis.   This morning, she denies any recurrent symptoms.  Reports she felt immediately better after receiving IVF in the ED.    Past Medical History:  Diagnosis Date  . CAD (coronary artery disease)    a. s/p DES to OM1 in 02/2017  . Essential hypertension   . Gout   . HOCM (hypertrophic obstructive cardiomyopathy) (HCC)   . Hyperlipidemia   . Rheumatoid arthritis [facility])     Past Surgical History:  Procedure Laterality Date  . CHOLECYSTECTOMY N/A 08/17/2018   Procedure: LAPAROSCOPIC CHOLECYSTECTOMY;  Surgeon: [name], MD;  Location: [facility];  Service: General;  Laterality: N/A;  . CORONARY BALLOON ANGIOPLASTY N/A 03/03/2017   Procedure: CORONARY BALLOON ANGIOPLASTY;  Surgeon: Marykay Lex, MD;  Location: [facility] INVASIVE CV LAB;  Service: Cardiovascular;  Laterality: N/A;  . CORONARY STENT INTERVENTION N/A 03/03/2017   Procedure: CORONARY STENT INTERVENTION;  Surgeon: Marykay Lex, MD;  Location: [facility] INVASIVE CV LAB;  Service: Cardiovascular;  Laterality: N/A;  . LEFT HEART CATH AND  CORONARY ANGIOGRAPHY N/A 03/02/2017   Procedure: LEFT HEART CATH AND CORONARY ANGIOGRAPHY;  Surgeon: Tonny Bollman, MD;  Location: [facility] INVASIVE CV LAB;  Service: Cardiovascular;  Laterality: N/A;     Home Medications:  Prior to Admission medications   Medication Sig Start Date End Date Taking? Authorizing Provider  allopurinol (ZYLOPRIM) 300 MG tablet Take 300 mg by mouth daily.     [provider]  aspirin EC 81 MG tablet Take 81 mg by mouth daily.    [provider]  atorvastatin (LIPITOR) 10 MG tablet Take 10 mg by  mouth daily.    [provider]  calcium gluconate 500 MG tablet Take 1 tablet by mouth 2 (two) times daily.    [provider]  carvedilol (COREG) 6.25 MG tablet Take 1 tablet (6.25 mg total) by mouth 2 (two) times daily with a meal. 11/16/17   Tat, Onalee Hua, MD  Cholecalciferol (VITAMIN D3) 50 MCG (2000 UT) TABS Take 1 tablet by mouth daily.    [provider]  clopidogrel (PLAVIX) 75 MG tablet TAKE 1 TABLET BY MOUTH ONCE DAILY TO PREVENT STROKE/HEART ATTACK. 11/22/18   Jonelle Sidle, MD  ezetimibe (ZETIA) 10 MG tablet Take 10 mg by mouth daily.    [provider]  famotidine (PEPCID) 20 MG tablet Take 1 tablet (20 mg total) by mouth 2 (two) times daily. 01/24/18   Gerhard Munch, MD  ferrous sulfate (FEROSUL) 325 (65 FE) MG tablet Take 325 mg by mouth daily with breakfast.    [provider]  furosemide (LASIX) 40 MG tablet Take 1 tablet (40 mg total) by mouth daily as needed for fluid or edema. 11/06/18   Jonelle Sidle, MD  methotrexate 2.5 MG tablet Take 20 mg by mouth once a week. On Thursday    [provider]  nitroGLYCERIN (NITROSTAT) 0.4 MG SL tablet Place 1 tablet (0.4 mg total) under the tongue every 5 (five) minutes as needed for chest pain. 03/04/17   Robbie Lis M, PA-C  pantoprazole (PROTONIX) 40 MG tablet Take 40 mg by mouth daily.    [provider]  verapamil (CALAN-SR) 240 MG CR tablet Take 1 tablet (240 mg total) by mouth daily. 11/17/17   Catarina Hartshorn, MD  Vitamin D, Ergocalciferol, (DRISDOL) 1.25 MG (50000 UT) CAPS capsule 50,000 Units every 7 (seven) days.  05/22/18   [provider]    Inpatient Medications: Scheduled Meds: . allopurinol  300 mg Oral Daily  . atorvastatin  10 mg Oral Daily  . calcium carbonate  1 tablet Oral BID  . carvedilol  6.25 mg Oral BID WC  . clopidogrel  75 mg Oral Daily  . ezetimibe  10 mg Oral Daily  . ferrous sulfate  325 mg Oral Q breakfast  . pantoprazole  40  mg Oral Daily  . sodium chloride flush  3 mL Intravenous Q12H  . verapamil  240 mg Oral Daily   Continuous Infusions: . sodium chloride     PRN Meds: sodium chloride, acetaminophen **OR** acetaminophen, ondansetron **OR** ondansetron (ZOFRAN) IV, sodium chloride flush  Allergies:   No Known Allergies  Social History:   Social History   Socioeconomic History  . Marital status: Widowed    Spouse name: Not on file  . Number of children: Not on file  . Years of education: Not on file  . Highest education level: Not on file  Occupational History  . Not on file  Tobacco Use  . Smoking status:  Never Smoker  . Smokeless tobacco: Never Used  Substance and Sexual Activity  . Alcohol use: No  . Drug use: No  . Sexual activity: Not on file  Other Topics Concern  . Not on file  Social History Narrative  . Not on file   Social Determinants of Health   Financial Resource Strain:   . Difficulty of Paying Living Expenses: Not on file  Food Insecurity:   . Worried About Programme researcher, broadcasting/film/video in the Last Year: Not on file  . Ran Out of Food in the Last Year: Not on file  Transportation Needs:   . Lack of Transportation (Medical): Not on file  . Lack of Transportation (Non-Medical): Not on file  Physical Activity:   . Days of Exercise per Week: Not on file  . Minutes of Exercise per Session: Not on file  Stress:   . Feeling of Stress : Not on file  Social Connections:   . Frequency of Communication with Friends and Family: Not on file  . Frequency of Social Gatherings with Friends and Family: Not on file  . Attends Religious Services: Not on file  . Active Member of Clubs or Organizations: Not on file  . Attends Banker Meetings: Not on file  . Marital Status: Not on file  Intimate Partner Violence:   . Fear of Current or Ex-Partner: Not on file  . Emotionally Abused: Not on file  . Physically Abused: Not on file  . Sexually Abused: Not on file     Family  History:    Family History  Problem Relation Age of Onset  . Hypertension Mother   . Liver disease Mother   . Alcohol abuse Father       Review of Systems    General:  No chills, fever, night sweats or weight changes. Positive for epistaxis.  Cardiovascular:  No edema, orthopnea, palpitations, paroxysmal nocturnal dyspnea. Positive for chest pain and dyspnea on exertion.  Dermatological: No rash, lesions/masses Respiratory: No cough, dyspnea Urologic: No hematuria, dysuria Abdominal:   No nausea, vomiting, diarrhea, bright red blood per rectum, melena, or hematemesis Neurologic:  No visual changes, wkns, changes in mental status. All other systems reviewed and are otherwise negative except as noted above.  Physical Exam/Data    Vitals:   08/06/19 0433 08/06/19 0500 08/06/19 0545 08/06/19 0600  BP: (!) 106/53 (!) 86/44 104/64 (!) 104/41  Pulse:  67    Resp: (!) 26 (!) 27 17 (!) 24  Temp:      TempSrc:      SpO2: 99% 97%    Weight:      Height:       No intake or output data in the 24 hours ending 08/06/19 1242 Filed Weights   08/05/19 2349  Weight: 83.9 kg   Body mass index is 33.84 kg/m.   General: Pleasant female appearing in NAD Psych: Normal affect. Neuro: Alert and oriented X 3. Moves all extremities spontaneously. HEENT: Normal  Neck: Supple without bruits or JVD. Lungs:  Resp regular and unlabored, CTA without wheezing or rales. Heart: RRR no s3, s4, 2/6 systolic murmur along Apex.  Abdomen: Soft, non-tender, non-distended, BS + x 4.  Extremities: No clubbing, cyanosis or lower extremity edema. DP/PT/Radials 2+ and equal bilaterally.   EKG:  The EKG was personally reviewed and demonstrates: NSR, HR 72 with LVH and TWI along the anterior and lateral leads which is similar to prior tracings from 06/2018.  Labs/Studies     Relevant CV Studies:  Cardiac Catheterization: 02/2017 1. Severe single-vessel coronary artery disease involving a large first  obtuse marginal Damaria Stofko of the left dominant circumflex 2. Moderate LAD stenosis 3. Small nondominant RCA with no significant obstruction 4. Mild to moderate left main stenosis 5. Known hypertrophic cardiomyopathy  Recommend PCI of the obtuse marginal. 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 The patient will be loaded with Plavix. Will schedule her PCI for tomorrow. Would use femoral artery access and consider either a 5 [place] guide catheter or a guide catheter with sideholes. Plan discussed at length with the patient's daughter. Note the patient is non-English speaking.   Coronary Stent Intervention: 02/2017  Ost LM lesion, 40 %stenosed.  1st Mrg lesion, 90 %stenosed.  A STENT SIERRA 3.00 X 12 MM drug eluting stent was successfully placed.  Post intervention, there is a 0% residual stenosis.  Lat 1st Mrg lesion, 60 %stenosed. Bifurcation lesion jailed by the initial stent  Post intervention - rescue PTCA only, there is a 20% residual stenosis.   Successful bifurcation PCI of large OM1 with major Almyra Birman of OM1 PTCA. A Xience [place] DES stent was used for the main Tyrease Vandeberg and then PTCA was performed through the stent into the side Luise Yamamoto for ostial stenosis.  Plan:  Transfer to Post Procedure Unit [facility]) for sheath removal  Continue dual antiplatelet therapy for one year (would be okay to stop aspirin after 3 months if necessary)  Anticipate that she should be ready for discharge tomorrow  Echocardiogram: 10/2018 IMPRESSIONS    1. The left ventricle has hyperdynamic systolic function, with an  ejection fraction of >65%. The cavity size was normal. There is moderately  increased left ventricular wall thickness. Left ventricular diastolic  Doppler parameters are consistent with  impaired relaxation.  2. LV is thickened, moderate to severely The LVOT is narrow. There is  systolic anterior motion of the anterior mitral  leaflet(SAM) There is near  cavity obliteration With Valsalva maneuver, there isobstruction to outlfow  with peak gradient of 100 mm Hg  COnsistent with IHSS.  3. The right ventricle has normal systolic function. The cavity was  normal. There is no increase in right ventricular wall thickness.  4. Left atrial size was moderately dilated.  5. Right atrial size was mildly dilated.  6. The mitral valve is abnormal. Mild thickening of the mitral valve  leaflet. There is mild mitral annular calcification present.  7. The aortic valve is tricuspid. Moderate thickening of the aortic  valve. Moderate calcification of the aortic valve.  8. The aortic valve is thickened, calcified Peak and mean gradients  through the LV / LVOT/AV are 50 and 28 mm Hg respectively By 2 D imaging  with hyperdynamic LV, narrow LVOT, near cavity oblitaration, some of this  is coming from below the valve By 2 D  imaging, AV appears more mildly narrowed (not moderate).  9. The aortic root is normal in size and structure.  10. The interatrial septum was not assessed.   Laboratory Data:  Chemistry Recent Labs  Lab 08/06/19 0052  NA 136  K 4.0  CL 106  CO2 21*  GLUCOSE 125*  BUN 17  CREATININE 0.93  CALCIUM 7.7*  GFRNONAA 59*  GFRAA >60  ANIONGAP 9    Recent Labs  Lab 08/06/19 0052  PROT 6.0*  ALBUMIN 3.2*  AST 16  ALT 14  ALKPHOS 43  BILITOT 0.7   Hematology Recent Labs  Lab 08/06/19 0052  WBC 7.7  RBC 3.34*  HGB 10.7*  HCT 32.9*  MCV 98.5  MCH 32.0  MCHC 32.5  RDW 14.5  PLT 175   Cardiac EnzymesNo results for input(s): TROPONINI in the last 168 hours. No results for input(s): TROPIPOC in the last 168 hours.  BNP Recent Labs  Lab 08/06/19 0052  BNP 250.0*    DDimer No results for input(s): DDIMER in the last 168 hours.  Radiology/Studies:  DG Chest Port 1 View  Result Date: 08/06/2019 CLINICAL DATA:  Chest pain, hypotension, shortness of breath and nose bleeds EXAM:  PORTABLE CHEST 1 VIEW COMPARISON:  None. FINDINGS: Hazy interstitial opacities are present within the lungs with some peripheral septal lines and indistinct cephalized vascularity. More linear opacities in the left mid lung could reflect subsegmental atelectasis. No pneumothorax or effusion. Cardiac silhouette upper limits normal for size. The aorta is calcified. The remaining cardiomediastinal contours are unremarkable. No acute osseous or soft tissue abnormality. Telemetry leads overlie the chest. IMPRESSION: Findings suggestive of mild CHF/volume overload. Electronically Signed   By: Kreg Shropshire M.D.   On: 08/06/2019 00:30     Assessment & Plan    1. Chest Pain and Dyspnea on Exertion in the setting of HOCM - She developed symptoms after being started on diuretic therapy. Her son believes she only took 1 dose of Lasix but experienced increased urination for several days. Her symptoms resolved upon a receiving IVF in the ED. Given resolution of her symptoms and that repeat CXR showed mild CHF, would not dose further IVF at this time. Suspect dehydration in the setting of HOCM triggered her symptoms.  -  Initial HS Troponin 234 with repeat values at 225, 216 and 198.  Values seem most consistent with demand ischemia and not ACS. EKG shows NSR, HR 72 with LVH and TWI along the anterior and lateral leads which is similar to prior tracings from 06/2018. On telemetry, she does appear to have some ST depression, therefore will order a repeat 12-Lead EKG.  - will obtain a repeat echocardiogram to assess LV function, wall motion and gradients. Remains on Coreg and Verapamil for HOCM. Reviewed genetic implications of HOCM with the patient's son today and encouraged him along with siblings to be screened.   2. CAD - s/p DES to OM1 in 02/2017.  She does report symptoms as outlined above but this resolved following administration of IVF.  - She has been on ASA and Plavix since stent placement in 2018.  Given  that she is over 2 years out from stent placement and has been experiencing worsening epistaxis, will review with Dr. Wyline Mood in regards to stopping either ASA or Plavix. Continue BB and statin therapy.   3. HTN  - BP has been low normal at 83/38 - 112/74 since admission. Remains on Coreg 6.25mg  BID and Verapamil CR  daily. If BP remains soft, would recommend reducing Verapamil to  daily.   4. HLD - followed by PCP. She remains on Atorvastatin  daily and Zetia  daily. Goal LDL is less than 70 with known CAD.   5. Anemia -  Hgb 10.7 on admission, at 13.0 in 2020. She does report frequent episodes of epistaxis as outlined above during which she will go through several boxes of Kleenex within a day. Will consider stopping ASA or Plavix following discussion with Dr. Wyline Mood.    For questions or updates, please contact [facility]  Please consult [email or web address] for contact info under Cardiology/STEMI.  Signed, Ellsworth Lennox, PA-C 08/06/2019, 12:42 PM Pager: [phone]   Attendning note  Patient seen and discussed with PA Iran Ouch, I agree with her documentation above. 79 yo female history of HOCM, CAD with prior DES to OM1 in 2018, HTN, HL presents with chest pain. From history symptoms started shortly after starting lasix as an outpatient.    K 4 Cr 0.93 BUN 17 WBC 7.7 Hgb 10.7 BNP 250  hstrop 234-->225-->216-->198 COVID neg CXR mild CHF EKG SR, chronic lateral ST/T changes Echo pending  10/2018 echo LVEF >65%, peak valsalva gradient .   Presents with chest pain, mild troponin elevation. EKG with chronic ST/T changes, echo is pending.  Would stop plavix, do not see indication for extended use from her stent in 2018. Continue ASA 81mg  daily. From Dr McDowell's note 04/2018 she was to stop plavix at that time.   Hypotension on presentaiton. Did not appear hypovolemic. No signs of sepsis. Repeat echo pending. Could be related to recent outpatient lasix,  potential worsening of dyanamic LVOT gradient. Ongoing soft bp's, would transition from coreg to toprol for less bp effects and less systemic vasodilation in setting of HOCM. Remains on verapamil.   Will follow up echo and monitor symptoms during admission, decide if ischemic testing is indicated and if so whether outpatient or inpatient.   05/2018 MD

## 2019-08-06 NOTE — ED Provider Notes (Signed)
[facility] EMERGENCY DEPARTMENT Provider Note   CSN: [id number] Arrival date & time: 08/05/19  2344   Time seen 12:15 AM  History Chief Complaint  Patient presents with  . Shortness of Breath   Interpretation was done by patient's son  Dana [name] is a 79 y.o. female.  HPI son states patient had a right-sided nosebleed all day yesterday and today.  He states it stopped this evening.  Patient is on Plavix.  He states she has had nosebleeds before that come and go.  She has not had to be evaluated by ENT for this.  He states about 11:30 PM she called him for chest pain and shortness of breath.  She indicates she has pain in the center of her chest but it spreads to her outer chest and up into her neck.  It does not radiate into her shoulders or her arms.  She describes the pain as tightness.  She states she has never had it before.  She has had a cardiac stent before and states this pain is different.  She also complains of feeling lightheaded and dizzy and feels cold.  She denies nausea, vomiting, or diaphoresis.   PCP [name], MD   Past Medical History:  Diagnosis Date  . CAD (coronary artery disease)    a. s/p DES to OM1 in 02/2017  . Essential hypertension   . Gout   . HOCM (hypertrophic obstructive cardiomyopathy) (HCC)   . Hyperlipidemia   . Rheumatoid arthritis [facility])     Patient Active Problem List   Diagnosis Date Noted  . Gallstone pancreatitis   . RUQ pain   . Acute on chronic diastolic CHF (congestive heart failure) (HCC) 01/26/2018  . Rheumatoid arthritis (HCC) 01/26/2018  . Elevated troponin 01/26/2018  . Acute lower UTI 01/26/2018  . Abdominal pain 01/26/2018  . GERD (gastroesophageal reflux disease) 01/26/2018  . Precordial chest pain   . CAD (coronary artery disease) 09/17/2017  . HOCM (hypertrophic obstructive cardiomyopathy) (HCC)   . Unstable angina (HCC)   . Coronary artery disease involving native coronary artery of native heart with unstable  angina pectoris (HCC)   . Chest pain 01/29/2017  . Hypertrophic cardiomyopathy (HCC) 01/29/2017  . MR (mitral regurgitation) 01/29/2017  . HTN (hypertension) 01/29/2017  . Hyperlipidemia 01/29/2017  . Gout 01/29/2017  . Arthritis 01/29/2017  . Anemia 01/29/2017    Past Surgical History:  Procedure Laterality Date  . CHOLECYSTECTOMY N/A 08/17/2018   Procedure: LAPAROSCOPIC CHOLECYSTECTOMY;  Surgeon: [name], MD;  Location: [facility];  Service: General;  Laterality: N/A;  . CORONARY BALLOON ANGIOPLASTY N/A 03/03/2017   Procedure: CORONARY BALLOON ANGIOPLASTY;  Surgeon: Marykay Lex, MD;  Location: [facility] INVASIVE CV LAB;  Service: Cardiovascular;  Laterality: N/A;  . CORONARY STENT INTERVENTION N/A 03/03/2017   Procedure: CORONARY STENT INTERVENTION;  Surgeon: Marykay Lex, MD;  Location: [facility] INVASIVE CV LAB;  Service: Cardiovascular;  Laterality: N/A;  . LEFT HEART CATH AND CORONARY ANGIOGRAPHY N/A 03/02/2017   Procedure: LEFT HEART CATH AND CORONARY ANGIOGRAPHY;  Surgeon: Tonny Bollman, MD;  Location: [facility] INVASIVE CV LAB;  Service: Cardiovascular;  Laterality: N/A;     OB History    Gravida  7   Para      Term      Preterm      AB      Living  7     SAB      TAB      Ectopic  Multiple      Live Births              Family History  Problem Relation Age of Onset  . Hypertension Mother   . Liver disease Mother   . Alcohol abuse Father     Social History   Tobacco Use  . Smoking status: Never Smoker  . Smokeless tobacco: Never Used  Substance Use Topics  . Alcohol use: No  . Drug use: No  lives at home Lives with son  Home Medications Prior to Admission medications   Medication Sig Start Date End Date Taking? Authorizing Provider  allopurinol (ZYLOPRIM) 300 MG tablet Take 300 mg by mouth daily.     [provider]  aspirin EC 81 MG tablet Take 81 mg by mouth daily.    [provider]  atorvastatin (LIPITOR) 10 MG tablet Take  10 mg by mouth daily.    [provider]  calcium gluconate 500 MG tablet Take 1 tablet by mouth 2 (two) times daily.    [provider]  carvedilol (COREG) 6.25 MG tablet Take 1 tablet (6.25 mg total) by mouth 2 (two) times daily with a meal. 11/16/17   Tat, Shanon Brow, MD  Cholecalciferol (VITAMIN D3) 50 MCG (2000 UT) TABS Take 1 tablet by mouth daily.    [provider]  clopidogrel (PLAVIX) 75 MG tablet TAKE 1 TABLET BY MOUTH ONCE DAILY TO PREVENT STROKE/HEART ATTACK. 11/22/18   Satira Sark, MD  ezetimibe (ZETIA) 10 MG tablet Take 10 mg by mouth daily.    [provider]  famotidine (PEPCID) 20 MG tablet Take 1 tablet (20 mg total) by mouth 2 (two) times daily. 01/24/18   Carmin Muskrat, MD  ferrous sulfate (FEROSUL) 325 (65 FE) MG tablet Take 325 mg by mouth daily with breakfast.    [provider]  furosemide (LASIX) 40 MG tablet Take 1 tablet (40 mg total) by mouth daily as needed for fluid or edema. 11/06/18   Satira Sark, MD  methotrexate 2.5 MG tablet Take 20 mg by mouth once a week. On Thursday    [provider]  nitroGLYCERIN (NITROSTAT) 0.4 MG SL tablet Place 1 tablet (0.4 mg total) under the tongue every 5 (five) minutes as needed for chest pain. 03/04/17   Lyda Jester M, PA-C  pantoprazole (PROTONIX) 40 MG tablet Take 40 mg by mouth daily.    [provider]  verapamil (CALAN-SR) 240 MG CR tablet Take 1 tablet (240 mg total) by mouth daily. 11/17/17   Orson Eva, MD  Vitamin D, Ergocalciferol, (DRISDOL) 1.25 MG (50000 UT) CAPS capsule 50,000 Units every 7 (seven) days.  05/22/18   [provider]    Allergies    Patient has no known allergies.  Review of Systems   Review of Systems  All other systems reviewed and are negative.   Physical Exam Updated Vital Signs BP 93/64   Pulse 62   Temp 98.2 F (36.8 C) (Oral)   Resp (!) 22   Ht 5\' 2"  (1.575 m)   Wt 83.9 kg   SpO2 100%   BMI 33.84  kg/m   Physical Exam Vitals and nursing note reviewed.  Constitutional:      Appearance: She is well-developed.     Comments: Looks like she feels bad, she keeps her eyes closed  HENT:     Head: Normocephalic and atraumatic.     Nose:     Comments: Patient has a  small amount of blood on the nasal septum on the right nostril.  There is no active bleeding now. Eyes:     Extraocular Movements: Extraocular movements intact.     Conjunctiva/sclera: Conjunctivae normal.     Pupils: Pupils are equal, round, and reactive to light.  Cardiovascular:     Rate and Rhythm: Normal rate and regular rhythm.  Pulmonary:     Effort: Pulmonary effort is normal.     Breath sounds: Normal breath sounds. No wheezing.  Musculoskeletal:        General: No deformity.     Cervical back: Normal range of motion.     Right lower leg: No edema.     Left lower leg: No edema.  Skin:    General: Skin is dry.     Coloration: Skin is pale.  Neurological:     General: No focal deficit present.     Mental Status: She is alert and oriented to person, place, and time.     Cranial Nerves: No cranial nerve deficit.  Psychiatric:        Mood and Affect: Mood normal.        Behavior: Behavior normal.     ED Results / Procedures / Treatments   Labs (all labs ordered are listed, but only abnormal results are displayed) Results for orders placed or performed during the hospital encounter of 08/05/19  Comprehensive metabolic panel  Result Value Ref Range   Sodium 136 135 - 145 mmol/L   Potassium 4.0 3.5 - 5.1 mmol/L   Chloride 106 98 - 111 mmol/L   CO2 21 (L) 22 - 32 mmol/L   Glucose, Bld 125 (H) 70 - 99 mg/dL   BUN 17 8 - 23 mg/dL   Creatinine, Ser 4.40 0.44 - 1.00 mg/dL   Calcium 7.7 (L) 8.9 - 10.3 mg/dL   Total Protein 6.0 (L) 6.5 - 8.1 g/dL   Albumin 3.2 (L) 3.5 - 5.0 g/dL   AST 16 15 - 41 U/L   ALT 14 0 - 44 U/L   Alkaline Phosphatase 43 38 - 126 U/L   Total Bilirubin 0.7 0.3 - 1.2 mg/dL   GFR calc  non Af Amer 59 (L) >60 mL/min   GFR calc Af Amer >60 >60 mL/min   Anion gap 9 5 - 15  CBC with Differential  Result Value Ref Range   WBC 7.7 4.0 - 10.5 K/uL   RBC 3.34 (L) 3.87 - 5.11 MIL/uL   Hemoglobin 10.7 (L) 12.0 - 15.0 g/dL   HCT 34.7 (L) 42.5 - 95.6 %   MCV 98.5 80.0 - 100.0 fL   MCH 32.0 26.0 - 34.0 pg   MCHC 32.5 30.0 - 36.0 g/dL   RDW 38.7 56.4 - 33.2 %   Platelets 175 150 - 400 K/uL   nRBC 0.0 0.0 - 0.2 %   Neutrophils Relative % 72 %   Neutro Abs 5.5 1.7 - 7.7 K/uL   Lymphocytes Relative 16 %   Lymphs Abs 1.2 0.7 - 4.0 K/uL   Monocytes Relative 10 %   Monocytes Absolute 0.8 0.1 - 1.0 K/uL   Eosinophils Relative 2 %   Eosinophils Absolute 0.1 0.0 - 0.5 K/uL   Basophils Relative 0 %   Basophils Absolute 0.0 0.0 - 0.1 K/uL   Immature Granulocytes 0 %   Abs Immature Granulocytes 0.03 0.00 - 0.07 K/uL  Brain natriuretic peptide  Result Value Ref Range   B Natriuretic Peptide 250.0 (H) 0.0 -  100.0 pg/mL  Sample to Blood Bank  Result Value Ref Range   Blood Bank Specimen SAMPLE AVAILABLE FOR TESTING    Sample Expiration      08/09/2019,2359 Performed at [facility], [address], [place], [place] [address]   Troponin I (High Sensitivity)  Result Value Ref Range   Troponin I (High Sensitivity) 234 (HH) <18 ng/L  Troponin I (High Sensitivity)  Result Value Ref Range   Troponin I (High Sensitivity) 225 (HH) <18 ng/L   Laboratory interpretation all normal except mildly elevated BNP which has been as high as 1100 about 3 years ago, mild anemia now, however a year ago her hemoglobin was 12-13 so she probably did have some significant nasal blood loss.  Her troponins are elevated but her delta troponin is negative.    EKG EKG Interpretation  Date/Time:  Monday August 05 2019 23:45:58 EST Ventricular Rate:  72 PR Interval:    QRS Duration: 91 QT Interval:  454 QTC Calculation: 497 R Axis:   37 Text Interpretation: Sinus rhythm Abnormal R-wave progression,  early transition Probable LVH with secondary repol abnrm Anterior Q waves, possibly due to LVH Abnormal T, probable ischemia, anterior leads Baseline wander in lead(s) V1 No significant change since last tracing 27 Jun 2018 Confirmed by Devoria Albe ([id number]) on 08/06/2019 12:10:44 AM   Radiology DG Chest Port 1 View  Result Date: 08/06/2019 CLINICAL DATA:  Chest pain, hypotension, shortness of breath and nose bleeds EXAM: PORTABLE CHEST 1 VIEW COMPARISON:  None. FINDINGS: Hazy interstitial opacities are present within the lungs with some peripheral septal lines and indistinct cephalized vascularity. More linear opacities in the left mid lung could reflect subsegmental atelectasis. No pneumothorax or effusion. Cardiac silhouette upper limits normal for size. The aorta is calcified. The remaining cardiomediastinal contours are unremarkable. No acute osseous or soft tissue abnormality. Telemetry leads overlie the chest. IMPRESSION: Findings suggestive of mild CHF/volume overload. Electronically Signed   By: Kreg Shropshire M.D.   On: 08/06/2019 00:30    Procedures .Critical Care Performed by: Devoria Albe, MD Authorized by: Devoria Albe, MD   Critical care provider statement:    Critical care time (minutes):  41   Critical care was necessary to treat or prevent imminent or life-threatening deterioration of the following conditions:  Circulatory failure   Critical care was time spent personally by me on the following activities:  Discussions with consultants, examination of patient, obtaining history from patient or surrogate, ordering and review of laboratory studies, ordering and review of radiographic studies, pulse oximetry, re-evaluation of patient's condition and review of old charts   (including critical care time)  Echocardiogram Oct 29, 2018 1. The left ventricle has hyperdynamic systolic function, with an  ejection fraction of >65%. The cavity size was normal. There is moderately  increased left  ventricular wall thickness. Left ventricular diastolic  Doppler parameters are consistent with  impaired relaxation.  2. LV is thickened, moderate to severely The LVOT is narrow. There is  systolic anterior motion of the anterior mitral leaflet(SAM) There is near  cavity obliteration With Valsalva maneuver, there isobstruction to outlfow  with peak gradient of 100 mm Hg  COnsistent with IHSS.  3. The right ventricle has normal systolic function. The cavity was  normal. There is no increase in right ventricular wall thickness.  4. Left atrial size was moderately dilated.  5. Right atrial size was mildly dilated.  6. The mitral valve is abnormal. Mild thickening of the mitral valve  leaflet. There is mild mitral annular calcification present.  7. The aortic valve is tricuspid. Moderate thickening of the aortic  valve. Moderate calcification of the aortic valve.  8. The aortic valve is thickened, calcified Peak and mean gradients  through the LV / LVOT/AV are 50 and 28 mm Hg respectively By 2 D imaging  with hyperdynamic LV, narrow LVOT, near cavity oblitaration, some of this  is coming from below the valve By 2 D  imaging, AV appears more mildly narrowed (not moderate).  9. The aortic root is normal in size and structure.  10. The interatrial septum was not assessed.   Medications Ordered in ED Medications  sodium chloride 0.9 % bolus 1,000 mL (0 mLs Intravenous Stopped 08/06/19 0221)  oxymetazoline (AFRIN) 0.05 % nasal spray 1 spray (1 spray Each Nare Given 08/06/19 0033)    ED Course  I have reviewed the triage vital signs and the nursing notes.  Pertinent labs & imaging results that were available during my care of the patient were reviewed by me and considered in my medical decision making (see chart for details).    MDM Rules/Calculators/A&P                      At the time of my exam patient's blood pressure is 80 systolic.  She was given 1 L normal saline.  Blood work  was done including a hold clot, patient has had a nosebleed for 24 hours and is on Plavix.  Her son states she is also having clots come out.  She may need a blood transfusion.  I am wondering if she is having chest pain with a demand ischemia due to her hypotension and blood loss.  Cardiac enzymes were done.  Patient has dropped her hemoglobin approximately 3 g from a year ago from 12-13 down to 10.  However she does not require blood transfusion at this time.   3:40 AM recheck, patient's blood pressure was 89/63 on her left arm, we put the blood pressure cuff on her right arm and her blood pressure was 102/58.  Through her son she states her chest pain is gone, her shortness of breath is gone.  She has had some wheezing before and she has some wheezing now when I listen to her.  She has never had to use a nebulizer or an inhaler before.  She denies feeling dizzy or lightheaded now.  She has never had to use CPAP.  Son states in retrospect that patient was seen by her primary care doctor a week ago.  After that she took the Lasix and that is when he noticed that she seemed to be less energetic and was more sleepy.  Review of her chart shows that she has hypertrophic obstructive cardiomyopathy which may be why she felt better after getting the IV fluids.  A dehydrated state would certainly cause an obstruction of her outflow track.  4:10 AM Dr. Sharl Ma, hospitalist will admit and have cardiology evaluate.  Patient has had no active nose bleeding while in the ED.  Final Clinical Impression(s) / ED Diagnoses Final diagnoses:  Shortness of breath  Chest pain, unspecified type  Hypotension, unspecified hypotension type  Hypertrophic cardiomyopathy (HCC)  Right-sided epistaxis  Anemia, unspecified type    Rx / DC Orders  Plan admission  Devoria Albe, MD, Concha Pyo, MD 08/06/19 [phone]

## 2019-08-06 NOTE — Progress Notes (Signed)
Patient admitted to the hospital earlier this morning by Dr. Sharl Ma  Patient seen and examined.  She no longer having any chest pain.  Lungs are clear bilaterally.  She has no lower extremity edema.  Assessment/plan.  1. Chest pain dyspnea on exertion in the setting of hypertrophic cardiomyopathy patient initially developed symptoms after being started on diuretic therapy.  Troponins have been mildly elevated in the 200 range, but have mostly been flat.  Consistent with ACS.  Cardiology following to evaluate need for possible stress testing versus repeat cath..  Repeat echocardiogram has been ordered. 2. Coronary artery disease.  Status post stents in the past.  She has been on aspirin and Plavix since 2018.  Per cardiology, can likely discontinue Plavix. 3. Hypertension.  Blood pressures running low.  She is chronically on Coreg and verapamil.  Medications are being adjusted by cardiology. 4. Hyperlipidemia.  Continue statin 5. Anemia.  She is having recent nosebleeds.  She is on aspirin and Plavix.  Plavix can likely be discontinued.  [employer]

## 2019-08-06 NOTE — ED Notes (Signed)
CRITICAL VALUE ALERT  Critical Value:  Troponin 234   Date & Time Notied:  08/06/2019 0146  Provider Notified: EDP Knapp   Orders Received/Actions taken: No orders at this time

## 2019-08-06 NOTE — H&P (Signed)
TRH H&P    Patient Demographics:    Dana [name], is a 79 y.o. female  MRN: [id number]  DOB - Feb 04, 1941  Admit Date - 08/05/2019  Referring MD/NP/PA: Dr. Tomi Bamberger  Outpatient Primary MD for the patient is [name], MD  Patient coming from: Home  Chief complaint-shortness of breath   HPI:    Dana [name]  is a 79 y.o. female, with history of hokum, chronic diastolic CHF, CAD s/p DES to OM1 in September 2018, hypertension, hyperlipidemia came to hospital with complaints of shortness of breath.  Patient has been having right-sided nosebleed yesterday.  Today patient called her son at 11:30 PM with complaints of chest pain and shortness of breath.  Chest pain was located centrally with radiation to outer chest and up to her neck.  Did not radiate to her shoulder or arms.  Described the pain as tightness. Denied nausea vomiting or diarrhea. Denies abdominal pain or dysuria No previous history of stroke or seizures. Nosebleed had stopped by the time patient came to the hospital. Patient's lab work showed BNP 250, troponin I 234, 225. Patient was found to be hypotensive in the ED, with BP 89/63.  Rechecking blood pressure in right arm was 102/58.    Review of systems:    In addition to the HPI above,    All other systems reviewed and are negative.    Past History of the following :    Past Medical History:  Diagnosis Date  . CAD (coronary artery disease)    a. s/p DES to OM1 in 02/2017  . Essential hypertension   . Gout   . HOCM (hypertrophic obstructive cardiomyopathy) ([place])   . Hyperlipidemia   . Rheumatoid arthritis [facility])       Past Surgical History:  Procedure Laterality Date  . CHOLECYSTECTOMY N/A 08/17/2018   Procedure: LAPAROSCOPIC CHOLECYSTECTOMY;  Surgeon: Aviva Signs, MD;  Location: [facility];  Service: General;  Laterality: N/A;  . CORONARY BALLOON ANGIOPLASTY N/A 03/03/2017   Procedure:  CORONARY BALLOON ANGIOPLASTY;  Surgeon: Leonie Man, MD;  Location: [place] [facility];  Service: Cardiovascular;  Laterality: N/A;  . CORONARY STENT INTERVENTION N/A 03/03/2017   Procedure: CORONARY STENT INTERVENTION;  Surgeon: Leonie Man, MD;  Location: [place] [facility];  Service: Cardiovascular;  Laterality: N/A;  . LEFT HEART CATH AND CORONARY ANGIOGRAPHY N/A 03/02/2017   Procedure: LEFT HEART CATH AND CORONARY ANGIOGRAPHY;  Surgeon: Sherren Mocha, MD;  Location: [place] [facility];  Service: Cardiovascular;  Laterality: N/A;      Social History:      Social History   Tobacco Use  . Smoking status: Never Smoker  . Smokeless tobacco: Never Used  Substance Use Topics  . Alcohol use: No       Family History :     Family History  Problem Relation Age of Onset  . Hypertension Mother   . Liver disease Mother   . Alcohol abuse Father       Home Medications:   Prior to Admission medications  Medication Sig Start Date End Date Taking? Authorizing Provider  allopurinol (ZYLOPRIM) 300 MG tablet Take 300 mg by mouth daily.     [provider]  aspirin EC 81 MG tablet Take 81 mg by mouth daily.    [provider]  atorvastatin (LIPITOR) 10 MG tablet Take 10 mg by mouth daily.    [provider]  calcium gluconate 500 MG tablet Take 1 tablet by mouth 2 (two) times daily.    [provider]  carvedilol (COREG) 6.25 MG tablet Take 1 tablet (6.25 mg total) by mouth 2 (two) times daily with a meal. 11/16/17   Tat, Onalee Hua, MD  Cholecalciferol (VITAMIN D3) 50 MCG (2000 UT) TABS Take 1 tablet by mouth daily.    [provider]  clopidogrel (PLAVIX) 75 MG tablet TAKE 1 TABLET BY MOUTH ONCE DAILY TO PREVENT STROKE/HEART ATTACK. 11/22/18   Jonelle Sidle, MD  ezetimibe (ZETIA) 10 MG tablet Take 10 mg by mouth daily.    [provider]  famotidine (PEPCID) 20 MG tablet Take 1 tablet (20 mg total) by mouth 2 (two) times daily.  01/24/18   Gerhard Munch, MD  ferrous sulfate (FEROSUL) 325 (65 FE) MG tablet Take 325 mg by mouth daily with breakfast.    [provider]  furosemide (LASIX) 40 MG tablet Take 1 tablet (40 mg total) by mouth daily as needed for fluid or edema. 11/06/18   Jonelle Sidle, MD  methotrexate 2.5 MG tablet Take 20 mg by mouth once a week. On Thursday    [provider]  nitroGLYCERIN (NITROSTAT) 0.4 MG SL tablet Place 1 tablet (0.4 mg total) under the tongue every 5 (five) minutes as needed for chest pain. 03/04/17   Robbie Lis M, PA-C  pantoprazole (PROTONIX) 40 MG tablet Take 40 mg by mouth daily.    [provider]  verapamil (CALAN-SR) 240 MG CR tablet Take 1 tablet (240 mg total) by mouth daily. 11/17/17   Catarina Hartshorn, MD  Vitamin D, Ergocalciferol, (DRISDOL) 1.25 MG (50000 UT) CAPS capsule 50,000 Units every 7 (seven) days.  05/22/18   [provider]     Allergies:    No Known Allergies   Physical Exam:   Vitals  Blood pressure (!) 86/44, pulse 67, temperature 98.2 F (36.8 C), temperature source Oral, resp. rate (!) 27, height 5\' 2"  (1.575 m), weight 83.9 kg, SpO2 97 %.  1.  General: Appears in no acute distress  2. Psychiatric: Alert, oriented x3, intact insight and judgment  3. Neurologic: Cranial nerves II through XII grossly intact, motor strength 5/5 in all extremities  4. HEENMT:  Atraumatic normocephalic, extraocular's are intact  5. Respiratory : Clear to auscultation bilaterally, no wheezing or crackles auscultated  6. Cardiovascular : S1-S2, regular, no murmur auscultated  7. Gastrointestinal:  Abdomen is soft, nontender, no organomegaly      Data Review:    CBC Recent Labs  Lab 08/06/19 0052  WBC 7.7  HGB 10.7*  HCT 32.9*  PLT 175  MCV 98.5  MCH 32.0  MCHC 32.5  RDW 14.5  LYMPHSABS 1.2  MONOABS 0.8  EOSABS 0.1  BASOSABS 0.0    ------------------------------------------------------------------------------------------------------------------  Results for orders placed or performed during the hospital encounter of 08/05/19 (from the past 48 hour(s))  Comprehensive metabolic panel     Status: Abnormal   Collection Time: 08/06/19 12:52 AM  Result Value Ref Range   Sodium 136 135 - 145 mmol/L   Potassium  4.0 3.5 - 5.1 mmol/L   Chloride 106 98 - 111 mmol/L   CO2 21 (L) 22 - 32 mmol/L   Glucose, Bld 125 (H) 70 - 99 mg/dL   BUN 17 8 - 23 mg/dL   Creatinine, Ser 1.61 0.44 - 1.00 mg/dL   Calcium 7.7 (L) 8.9 - 10.3 mg/dL   Total Protein 6.0 (L) 6.5 - 8.1 g/dL   Albumin 3.2 (L) 3.5 - 5.0 g/dL   AST 16 15 - 41 U/L   ALT 14 0 - 44 U/L   Alkaline Phosphatase 43 38 - 126 U/L   Total Bilirubin 0.7 0.3 - 1.2 mg/dL   GFR calc non Af Amer 59 (L) >60 mL/min   GFR calc Af Amer >60 >60 mL/min   Anion gap 9 5 - 15    Comment: Performed at [facility], [address], [place], [place] [address]  CBC with Differential     Status: Abnormal   Collection Time: 08/06/19 12:52 AM  Result Value Ref Range   WBC 7.7 4.0 - 10.5 K/uL   RBC 3.34 (L) 3.87 - 5.11 MIL/uL   Hemoglobin 10.7 (L) 12.0 - 15.0 g/dL   HCT 54.0 (L) 98.1 - 19.1 %   MCV 98.5 80.0 - 100.0 fL   MCH 32.0 26.0 - 34.0 pg   MCHC 32.5 30.0 - 36.0 g/dL   RDW 47.8 29.5 - 62.1 %   Platelets 175 150 - 400 K/uL   nRBC 0.0 0.0 - 0.2 %   Neutrophils Relative % 72 %   Neutro Abs 5.5 1.7 - 7.7 K/uL   Lymphocytes Relative 16 %   Lymphs Abs 1.2 0.7 - 4.0 K/uL   Monocytes Relative 10 %   Monocytes Absolute 0.8 0.1 - 1.0 K/uL   Eosinophils Relative 2 %   Eosinophils Absolute 0.1 0.0 - 0.5 K/uL   Basophils Relative 0 %   Basophils Absolute 0.0 0.0 - 0.1 K/uL   Immature Granulocytes 0 %   Abs Immature Granulocytes 0.03 0.00 - 0.07 K/uL    Comment: Performed at [facility], [address], [place], [place] [address]  Troponin I (High Sensitivity)     Status: Abnormal    Collection Time: 08/06/19 12:52 AM  Result Value Ref Range   Troponin I (High Sensitivity) 234 (HH) <18 ng/L    Comment: CRITICAL RESULT CALLED TO, READ BACK BY AND VERIFIED WITH: H LONG,RN  08/06/19 MKELLY (NOTE) Elevated high sensitivity troponin I (hsTnI) values and significant  changes across serial measurements may suggest ACS but many other  chronic and acute conditions are known to elevate hsTnI results.  Refer to the Links section for chest pain algorithms and additional  guidance. Performed at [facility], [address] [place], [place] [address]   Sample to Blood Bank     Status: None   Collection Time: 08/06/19 12:52 AM  Result Value Ref Range   Blood Bank Specimen SAMPLE AVAILABLE FOR TESTING    Sample Expiration      08/09/2019,2359 Performed at [facility], [address] [place], [place] [address]   Brain natriuretic peptide     Status: Abnormal   Collection Time: 08/06/19 12:52 AM  Result Value Ref Range   B Natriuretic Peptide 250.0 (H) 0.0 - 100.0 pg/mL    Comment: Performed at [facility], [address] [place], [place] [address]  Troponin I (High Sensitivity)     Status: Abnormal   Collection Time: 08/06/19  2:23 AM  Result Value Ref Range   Troponin I (High Sensitivity) 225 (HH) <18 ng/L    Comment: CRITICAL VALUE NOTED.  VALUE IS CONSISTENT WITH PREVIOUSLY REPORTED AND CALLED VALUE. (NOTE) Elevated high sensitivity troponin I (hsTnI) values and significant  changes across serial measurements may suggest ACS but many other  chronic and acute conditions are known to elevate hsTnI results.  Refer to the Links section for chest pain algorithms and additional  guidance. Performed at [facility], [address], [place], [place] [address]     Chemistries  Recent Labs  Lab 08/06/19 0052  NA 136  K 4.0  CL 106  CO2 21*  GLUCOSE 125*  BUN 17  CREATININE 0.93  CALCIUM 7.7*  AST 16  ALT 14  ALKPHOS 43  BILITOT 0.7    ------------------------------------------------------------------------------------------------------------------  ------------------------------------------------------------------------------------------------------------------ GFR: Estimated Creatinine Clearance: 50.9 mL/min (by C-G formula based on SCr of 0.93 mg/dL). Liver Function Tests: Recent Labs  Lab 08/06/19 0052  AST 16  ALT 14  ALKPHOS 43  BILITOT 0.7  PROT 6.0*  ALBUMIN 3.2*    --------------------------------------------------------------------------------------------------------------- Urine analysis:    Component Value Date/Time   COLORURINE YELLOW 07/15/2018 1212   APPEARANCEUR CLOUDY (A) 07/15/2018 1212   LABSPEC 1.013 07/15/2018 1212   PHURINE 7.0 07/15/2018 1212   GLUCOSEU NEGATIVE 07/15/2018 1212   HGBUR LARGE (A) 07/15/2018 1212   BILIRUBINUR NEGATIVE 07/15/2018 1212   KETONESUR NEGATIVE 07/15/2018 1212   PROTEINUR 100 (A) 07/15/2018 1212   NITRITE POSITIVE (A) 07/15/2018 1212   LEUKOCYTESUR LARGE (A) 07/15/2018 1212      Imaging Results:    DG Chest Port 1 View  Result Date: 08/06/2019 CLINICAL DATA:  Chest pain, hypotension, shortness of breath and nose bleeds EXAM: PORTABLE CHEST 1 VIEW COMPARISON:  None. FINDINGS: Hazy interstitial opacities are present within the lungs with some peripheral septal lines and indistinct cephalized vascularity. More linear opacities in the left mid lung could reflect subsegmental atelectasis. No pneumothorax or effusion. Cardiac silhouette upper limits normal for size. The aorta is calcified. The remaining cardiomediastinal contours are unremarkable. No acute osseous or soft tissue abnormality. Telemetry leads overlie the chest. IMPRESSION: Findings suggestive of mild CHF/volume overload. Electronically Signed   By: Kreg Shropshire M.D.   On: 08/06/2019 00:30    My personal review of EKG: Rhythm NSR, LVH pattern   Assessment & Plan:    Active Problems:   Chest  pain   1. Chest pain-resolved at this time, patient has mild elevation of troponin.  Will obtain serial troponin x2. 2. Epistaxis-resolved.  Patient is on aspirin and Plavix.  Will hold aspirin at this time. 3. HOCM/diastolic CHF-patient has diastolic CHF and hyper obstructive cardiomyopathy.  Currently on beta-blockers.  Chest x-ray shows finding of fluid overload.  Will avoid giving Lasix at this time due to hypotension. 4. Hypotension-patient had mild hypotension when she arrived to the hospital.  Blood pressure is stable at this time.  Continue Coreg, verapamil.  Will consult cardiology for adjustment of medications. 5. CAD s/p stent to OM1-continue Plavix, aspirin on hold due to epistaxis.  We will continue with Coreg, Lipitor, Zetia.    DVT Prophylaxis-   SCDs  AM Labs Ordered, also please review Full Orders  Family Communication: Admission, patients condition and plan of care including tests being ordered have been discussed with the patient  who indicate understanding and agree with the plan and Code Status.  Code Status: Full code  Admission status: Observation based on patients clinical  presentation and evaluation of above clinical data, I have made determination that patient will need less than 2 midnight stay in the hospital.  Time spent in minutes : 60 minutes   Saniyah Mondesir S Hjalmar Ballengee M.D

## 2019-08-07 ENCOUNTER — Observation Stay (HOSPITAL_BASED_OUTPATIENT_CLINIC_OR_DEPARTMENT_OTHER): Payer: Medicare Other

## 2019-08-07 DIAGNOSIS — R079 Chest pain, unspecified: Secondary | ICD-10-CM | POA: Diagnosis not present

## 2019-08-07 DIAGNOSIS — R0602 Shortness of breath: Secondary | ICD-10-CM | POA: Diagnosis not present

## 2019-08-07 DIAGNOSIS — I421 Obstructive hypertrophic cardiomyopathy: Secondary | ICD-10-CM | POA: Diagnosis not present

## 2019-08-07 LAB — CBC
HCT: 31.8 % — ABNORMAL LOW (ref 36.0–46.0)
Hemoglobin: 10.3 g/dL — ABNORMAL LOW (ref 12.0–15.0)
MCH: 31.8 pg (ref 26.0–34.0)
MCHC: 32.4 g/dL (ref 30.0–36.0)
MCV: 98.1 fL (ref 80.0–100.0)
Platelets: 198 10*3/uL (ref 150–400)
RBC: 3.24 MIL/uL — ABNORMAL LOW (ref 3.87–5.11)
RDW: 14.3 % (ref 11.5–15.5)
WBC: 7 10*3/uL (ref 4.0–10.5)
nRBC: 0 % (ref 0.0–0.2)

## 2019-08-07 LAB — COMPREHENSIVE METABOLIC PANEL
ALT: 13 U/L (ref 0–44)
AST: 14 U/L — ABNORMAL LOW (ref 15–41)
Albumin: 2.9 g/dL — ABNORMAL LOW (ref 3.5–5.0)
Alkaline Phosphatase: 42 U/L (ref 38–126)
Anion gap: 5 (ref 5–15)
BUN: 14 mg/dL (ref 8–23)
CO2: 25 mmol/L (ref 22–32)
Calcium: 7.8 mg/dL — ABNORMAL LOW (ref 8.9–10.3)
Chloride: 108 mmol/L (ref 98–111)
Creatinine, Ser: 0.85 mg/dL (ref 0.44–1.00)
GFR calc Af Amer: >60 mL/min (ref >60)
GFR calc non Af Amer: >60 mL/min (ref >60)
Glucose, Bld: 96 mg/dL (ref 70–99)
Potassium: 3.9 mmol/L (ref 3.5–5.1)
Sodium: 138 mmol/L (ref 135–145)
Total Bilirubin: 1 mg/dL (ref 0.3–1.2)
Total Protein: 5.6 g/dL — ABNORMAL LOW (ref 6.5–8.1)

## 2019-08-07 LAB — ECHOCARDIOGRAM COMPLETE
Height: 62 in
Weight: 2960 oz

## 2019-08-07 MED ORDER — ASPIRIN EC 81 MG PO TBEC: 81.0000 mg | DELAYED_RELEASE_TABLET | Freq: Every day | ORAL | 4 refills | Status: DC

## 2019-08-07 MED ORDER — METOPROLOL SUCCINATE ER 25 MG PO TB24: 25.0000 mg | ORAL_TABLET | Freq: Every day | ORAL | 3 refills | Status: DC

## 2019-08-07 MED ORDER — VERAPAMIL HCL ER 240 MG PO TBCR: 240.0000 mg | EXTENDED_RELEASE_TABLET | Freq: Every day | ORAL | 2 refills | Status: DC

## 2019-08-07 MED ORDER — ACETAMINOPHEN 325 MG PO TABS: 650.0000 mg | ORAL_TABLET | Freq: Four times a day (QID) | ORAL | 0 refills | Status: AC | PRN

## 2019-08-07 MED ORDER — PANTOPRAZOLE SODIUM 40 MG PO TBEC: 40.0000 mg | DELAYED_RELEASE_TABLET | Freq: Every day | ORAL | 4 refills | Status: DC

## 2019-08-07 NOTE — Discharge Instructions (Signed)
1)Please Stop Plavix/Clopidogrel as advised by your cardiologist 2) please continue aspirin 81 mg daily with food 3)Avoid ibuprofen/Advil/Aleve/Motrin/Goody Powders/Naproxen/BC powders/Meloxicam/Diclofenac/Indomethacin and other Nonsteroidal anti-inflammatory medications as these will make you more likely to bleed and can cause stomach ulcers, can also cause Kidney problems.  4) please Stop Coreg/carvedilol, start metoprolol/Toprol instead 5) please Stop Lasix/furosemide 6) follow-up with your primary care physician and your cardiologist as advised 7) repeat CBC/complete blood count test with your primary care physician in 1 to 2 weeks advised

## 2019-08-07 NOTE — Care Management Obs Status (Signed)
MEDICARE OBSERVATION STATUS NOTIFICATION   Patient Details  Name: Dana [name] MRN: [id number] Date of Birth: July 03, 1940   Medicare Observation Status Notification Given:  Yes    Corey Harold 08/07/2019, 11:21 AM

## 2019-08-07 NOTE — Progress Notes (Signed)
Progress Note  Patient Name: Corinn Stoltzfus Date of Encounter: 08/07/2019  Primary Cardiologist: Nona Dell, MD   Subjective   No complaints  Inpatient Medications    Scheduled Meds: . allopurinol  300 mg Oral Daily  . aspirin EC  81 mg Oral Daily  . atorvastatin  10 mg Oral Daily  . calcium carbonate  1 tablet Oral BID  . ezetimibe  10 mg Oral Daily  . ferrous sulfate  325 mg Oral Q breakfast  . metoprolol succinate  25 mg Oral Daily  . pantoprazole  40 mg Oral Daily  . sodium chloride flush  3 mL Intravenous Q12H  . verapamil  240 mg Oral Daily   Continuous Infusions: . sodium chloride     PRN Meds: sodium chloride, acetaminophen **OR** acetaminophen, ondansetron **OR** ondansetron (ZOFRAN) IV, sodium chloride flush   Vital Signs    Vitals:   08/06/19 1412 08/06/19 2123 08/07/19 0533 08/07/19 0825  BP: 92/75 107/72 133/66 (!) 142/81  Pulse:  80 86 85  Resp: 18 20 18    Temp:  99.6 F (37.6 C) 99.6 F (37.6 C)   TempSrc:  Oral Oral   SpO2:  93% 99%   Weight:      Height:        Intake/Output Summary (Last 24 hours) at 08/07/2019 0959 Last data filed at 08/06/2019 1700 Gross per 24 hour  Intake 240 ml  Output --  Net 240 ml   Last 3 Weights 08/05/2019 05/17/2019 08/30/2018  Weight (lbs) 185 lb 186 lb 173 lb 9.6 oz  Weight (kg) 83.915 kg 84.369 kg 78.744 kg      Telemetry    NSR - Personally Reviewed  ECG    n/a - Personally Reviewed  Physical Exam   GEN: No acute distress.   Neck: No JVD Cardiac: RRR, 3/6 systolic murmur rusb Respiratory: Clear to auscultation bilaterally. GI: Soft, nontender, non-distended  MS: No edema; No deformity. Neuro:  Nonfocal  Psych: Normal affect   Labs    High Sensitivity Troponin:   Recent Labs  Lab 08/06/19 0052 08/06/19 0223 08/06/19 0633 08/06/19 0847  TROPONINIHS 234* 225* 216* 198*      Chemistry Recent Labs  Lab 08/06/19 0052 08/07/19 0511  NA 136 138  K 4.0 3.9  CL 106 108  CO2 21* 25   GLUCOSE 125* 96  BUN 17 14  CREATININE 0.93 0.85  CALCIUM 7.7* 7.8*  PROT 6.0* 5.6*  ALBUMIN 3.2* 2.9*  AST 16 14*  ALT 14 13  ALKPHOS 43 42  BILITOT 0.7 1.0  GFRNONAA 59* >60  GFRAA >60 >60  ANIONGAP 9 5     Hematology Recent Labs  Lab 08/06/19 0052 08/07/19 0511  WBC 7.7 7.0  RBC 3.34* 3.24*  HGB 10.7* 10.3*  HCT 32.9* 31.8*  MCV 98.5 98.1  MCH 32.0 31.8  MCHC 32.5 32.4  RDW 14.5 14.3  PLT 175 198    BNP Recent Labs  Lab 08/06/19 0052  BNP 250.0*     DDimer No results for input(s): DDIMER in the last 168 hours.   Radiology    DG Chest Port 1 View  Result Date: 08/06/2019 CLINICAL DATA:  Chest pain, hypotension, shortness of breath and nose bleeds EXAM: PORTABLE CHEST 1 VIEW COMPARISON:  None. FINDINGS: Hazy interstitial opacities are present within the lungs with some peripheral septal lines and indistinct cephalized vascularity. More linear opacities in the left mid lung could reflect subsegmental atelectasis. No pneumothorax or effusion.  Cardiac silhouette upper limits normal for size. The aorta is calcified. The remaining cardiomediastinal contours are unremarkable. No acute osseous or soft tissue abnormality. Telemetry leads overlie the chest. IMPRESSION: Findings suggestive of mild CHF/volume overload. Electronically Signed   By: Lovena Le M.D.   On: 08/06/2019 00:30    Cardiac Studies    Patient Profile   Macarena Langseth is a 79 y.o. female with past medical history of CAD (s/p DES to OM1 in 02/2017), HOCM, HTN and HLD who is being seen today for the evaluation of chest pain and CHF in the setting of HOCM at the request of Dr. Darrick Meigs.   Assessment & Plan    1. Elevated troponin - CAD with prior DES to OM1 in 2018 - history of HOCM with severe gradient  - symptoms of chest pain started shortly after starting lasix as outpatient. Resolved this admission with IVFs - mild trop 234 that trended down. EKG SR, chronic ST/T changes. Echo is pending - chest  pain has resolved, no recurrence since IVFs in ER - would f/u echo, if no acute findings would plan for discharge. Chronology of events would suggest possibly worsening of subvavlualr gradient due to lasix use creating increased demand from high gradient in setting of thickened myocardium proned to subendocardial ischemia - if recurrent symptoms overtime would reconsider ischemic testing at that point.    2. HOCM - diuretic stopped - due to soft bp's coreg changed to toprol for less bp effects and less afterload affects with HOCM, continued on verapmail - repeat echo pendign   3. History of CAD -  prior DES to [place] in 2018 - plavix stopped due to epistaxis, continued on ASA. Last stent 2018   If no acute findings on echo would plan for discharge today.   For questions or updates, please contact [place] Please consult [email or web address] for contact info under        Signed, Carlyle Dolly, MD  08/07/2019, 9:59 AM

## 2019-08-07 NOTE — Discharge Summary (Signed)
Dana [name], is a 79 y.o. female  DOB 11-08-1940  MRN [id number].  Admission date:  08/05/2019  Admitting Physician  Oswald Hillock, MD  Discharge Date:  08/07/2019   Primary MD  [name], MD  Recommendations for primary care physician for things to follow:   1)Please Stop Plavix/Clopidogrel as advised by your cardiologist 2) please continue aspirin 81 mg daily with food 3)Avoid ibuprofen/Advil/Aleve/Motrin/Goody Powders/Naproxen/BC powders/Meloxicam/Diclofenac/Indomethacin and other Nonsteroidal anti-inflammatory medications as these will make you more likely to bleed and can cause stomach ulcers, can also cause Kidney problems.  4) please stop Coreg/carvedilol, start metoprolol/Toprol instead 5) please stop Lasix/furosemide 6) follow-up with your primary care physician and your cardiologist as advised 7) repeat CBC/complete blood count test with your primary care physician in 1 to 2 weeks advised  Admission Diagnosis  Shortness of breath [R06.02] Hypertrophic cardiomyopathy (HCC) [I42.2] SOB (shortness of breath) [R06.02] Right-sided epistaxis [R04.0] Chest pain [R07.9] Hypotension, unspecified hypotension type [I95.9] Anemia, unspecified type [D64.9] Chest pain, unspecified type [R07.9]   Discharge Diagnosis  Shortness of breath [R06.02] Hypertrophic cardiomyopathy ([place]) [I42.2] SOB (shortness of breath) [R06.02] Right-sided epistaxis [R04.0] Chest pain [R07.9] Hypotension, unspecified hypotension type [I95.9] Anemia, unspecified type [D64.9] Chest pain, unspecified type [R07.9]    Principal Problem:   Chest pain Active Problems:   CAD (coronary artery disease)   HOCM (hypertrophic obstructive cardiomyopathy) (HCC)   HTN (hypertension)   Anemia   Rheumatoid arthritis ([place])      Past Medical History:  Diagnosis Date  . CAD (coronary artery disease)    a. s/p DES to OM1 in 02/2017  .  Essential hypertension   . Gout   . HOCM (hypertrophic obstructive cardiomyopathy) ([place])   . Hyperlipidemia   . Rheumatoid arthritis [facility])     Past Surgical History:  Procedure Laterality Date  . CHOLECYSTECTOMY N/A 08/17/2018   Procedure: LAPAROSCOPIC CHOLECYSTECTOMY;  Surgeon: Aviva Signs, MD;  Location: [facility];  Service: General;  Laterality: N/A;  . CORONARY BALLOON ANGIOPLASTY N/A 03/03/2017   Procedure: CORONARY BALLOON ANGIOPLASTY;  Surgeon: Leonie Man, MD;  Location: [place] [facility];  Service: Cardiovascular;  Laterality: N/A;  . CORONARY STENT INTERVENTION N/A 03/03/2017   Procedure: CORONARY STENT INTERVENTION;  Surgeon: Leonie Man, MD;  Location: [place] [facility];  Service: Cardiovascular;  Laterality: N/A;  . LEFT HEART CATH AND CORONARY ANGIOGRAPHY N/A 03/02/2017   Procedure: LEFT HEART CATH AND CORONARY ANGIOGRAPHY;  Surgeon: Sherren Mocha, MD;  Location: [place] [facility];  Service: Cardiovascular;  Laterality: N/A;       HPI  from the history and physical done on the day of admission:    Dana [name]  is a 79 y.o. female, with history of hokum, chronic diastolic CHF, CAD s/p DES to OM1 in September 2018, hypertension, hyperlipidemia came to hospital with complaints of shortness of breath.  Patient has been having right-sided nosebleed yesterday.  Today patient called her son at 11:30 PM with complaints of chest pain and shortness of breath.  Chest pain was located centrally with radiation to outer chest and up to her neck.  Did not radiate to her shoulder or arms.  Described the pain as tightness. Denied nausea vomiting or diarrhea. Denies abdominal pain or dysuria No previous history of stroke or seizures. Nosebleed had stopped by the time patient came to the hospital. Patient's lab work showed BNP 250, troponin I 234, 225. Patient was found to be hypotensive in the ED, with BP 89/63.  Rechecking blood pressure in right arm was 102/58.   Hospital Course:       1) history of CAD/elevated troponin/chest pain dyspnea on exertion in the setting of hypertrophic cardiomyopathy (HOCM)-- patient initially developed symptoms after being started on diuretic therapy.  Troponins have been mildly elevated in the 200 range, but have mostly been flat.  Not Consistent with ACS.  Cardiology fconsult appreciated, echo with EF of 55 to 60% with diastolic dysfunction--- Per cardiologist okay to stop Coreg, use Toprol instead due to soft BP and less afterload affect with HOCM, continue verapamil, okay to stop Plavix, continue aspirin due to bleeding concerns/epistasis patient had DES to OM1 in 2018  2)Hypertension.  Blood pressures running low---  Medications are being adjusted by cardiology as above #1  3)Hyperlipidemia.  Continue statin   4)Anemia-suspect acute blood loss anemia due to recent nosebleeds, hemoglobin currently stable around 10 - She is having recent nosebleeds.  She is on aspirin and Plavix.  Plavix discontinued as above #1   Discharge Condition: Stable  Follow UP--PCP and cardiologist as advised  Follow-up Information    Ellsworth Lennox, PA-C Follow up on 08/19/2019.   Specialties: Physician Assistant, Cardiology Why: Cardiology Hospital Follow-up on 08/19/2019 at 2:30 PM.  Contact information: [address] [place] [address] [phone]        [name], MD. Schedule an appointment as soon as possible for a visit in 1 week(s).   Specialty: Internal Medicine Contact information: [address] [place] [address] [phone]        Jonelle Sidle, MD .   Specialty: Cardiology Contact information: [address] [place] [address] [phone]            Consults obtained -cardiology Diet and Activity recommendation:  As advised  Discharge Instructions     Discharge Instructions    Call MD for:  difficulty breathing, headache or visual disturbances   Complete by: As directed      Call MD for:  extreme fatigue   Complete by: As directed    Call MD for:  persistant dizziness or light-headedness   Complete by: As directed    Call MD for:  persistant nausea and vomiting   Complete by: As directed    Call MD for:  severe uncontrolled pain   Complete by: As directed    Call MD for:  temperature >100.4   Complete by: As directed    Diet - low sodium heart healthy   Complete by: As directed    Discharge instructions   Complete by: As directed    1)Please Stop Plavix/Clopidogrel as advised by your cardiologist 2) please continue aspirin 81 mg daily with food 3)Avoid ibuprofen/Advil/Aleve/Motrin/Goody Powders/Naproxen/BC powders/Meloxicam/Diclofenac/Indomethacin and other Nonsteroidal anti-inflammatory medications as these will make you more likely to bleed and can cause stomach ulcers, can also cause Kidney problems.  4) please stop Coreg/carvedilol, start metoprolol/Toprol instead 5) please stop Lasix/furosemide 6) follow-up with your primary care physician and your cardiologist as  advised 7) repeat CBC/complete blood count test with your primary care physician in 1 to 2 weeks advised   Increase activity slowly   Complete by: As directed        Discharge Medications     Allergies as of 08/07/2019   No Known Allergies     Medication List    STOP taking these medications   carvedilol 6.25 MG tablet Commonly known as: COREG   clopidogrel 75 MG tablet Commonly known as: PLAVIX   furosemide 40 MG tablet Commonly known as: LASIX     TAKE these medications   acetaminophen 325 MG tablet Commonly known as: TYLENOL Take 2 tablets (650 mg total) by mouth every 6 (six) hours as needed for mild pain, fever or headache (or Fever >/= 101).   alendronate 70 MG tablet Commonly known as: FOSAMAX Take 70 mg by mouth once a week.   Allergy Relief 10 MG tablet Generic drug: loratadine Take 10 mg by mouth daily.   allopurinol 300 MG tablet Commonly known as:  ZYLOPRIM Take 300 mg by mouth daily.   aspirin EC 81 MG tablet Take 1 tablet (81 mg total) by mouth daily with breakfast. What changed: when to take this   atorvastatin 10 MG tablet Commonly known as: LIPITOR Take 10 mg by mouth daily.   calcium gluconate 500 MG tablet Take 1 tablet by mouth 2 (two) times daily.   ezetimibe 10 MG tablet Commonly known as: ZETIA Take 10 mg by mouth daily.   famotidine 20 MG tablet Commonly known as: PEPCID Take 1 tablet (20 mg total) by mouth 2 (two) times daily.   FeroSul 325 (65 FE) MG tablet Generic drug: ferrous sulfate Take 325 mg by mouth daily with breakfast.   folic acid 1 MG tablet Commonly known as: FOLVITE Take 1 mg by mouth daily.   methotrexate 2.5 MG tablet Take 20 mg by mouth every Wednesday.   metoprolol succinate 25 MG 24 hr tablet Commonly known as: TOPROL-XL Take 1 tablet (25 mg total) by mouth daily. For Heart and BP Start taking on: August 08, 2019   nitroGLYCERIN 0.4 MG SL tablet Commonly known as: NITROSTAT Place 1 tablet (0.4 mg total) under the tongue every 5 (five) minutes as needed for chest pain.   pantoprazole 40 MG tablet Commonly known as: PROTONIX Take 1 tablet (40 mg total) by mouth daily.   prednisoLONE acetate 1 % ophthalmic suspension Commonly known as: PRED FORTE Place 1 drop into the right eye every 2 (two) hours.   Restasis 0.05 % ophthalmic emulsion Generic drug: cycloSPORINE Place 1 drop into both eyes 2 (two) times daily.   sucralfate 1 g tablet Commonly known as: CARAFATE Take 1 g by mouth 2 (two) times daily.   verapamil 240 MG CR tablet Commonly known as: CALAN-SR Take 1 tablet (240 mg total) by mouth daily.   Vitamin D3 25 MCG (1000 UT) Caps Take 1 capsule by mouth daily.       Major procedures and Radiology Reports - PLEASE review detailed and final reports for all details, in brief -    DG Chest Port 1 View  Result Date: 08/06/2019 CLINICAL DATA:  Chest pain,  hypotension, shortness of breath and nose bleeds EXAM: PORTABLE CHEST 1 VIEW COMPARISON:  None. FINDINGS: Hazy interstitial opacities are present within the lungs with some peripheral septal lines and indistinct cephalized vascularity. More linear opacities in the left mid lung could reflect subsegmental atelectasis. No pneumothorax or effusion. Cardiac  silhouette upper limits normal for size. The aorta is calcified. The remaining cardiomediastinal contours are unremarkable. No acute osseous or soft tissue abnormality. Telemetry leads overlie the chest. IMPRESSION: Findings suggestive of mild CHF/volume overload. Electronically Signed   By: Kreg Shropshire M.D.   On: 08/06/2019 00:30   ECHOCARDIOGRAM COMPLETE  Result Date: 08/07/2019    ECHOCARDIOGRAM REPORT   Patient Name:   HALI BALGOBIN Date of Exam: 08/07/2019 Medical Rec #:  [id number]    Height:       62.0 in Accession #:    [id number]   Weight:       185.0 lb Date of Birth:  March 30, 1941    BSA:          1.85 m Patient Age:    78 years     BP:           142/81 mmHg Patient Gender: F            HR:           85 bpm. [facility] Procedure: 2D Echo Indications:    Hypertrophic obstructive cardiomyopathy 425.11 / I42.1  History:        Patient has prior history of Echocardiogram examinations, most                 recent 10/29/2018. CHF, CAD, Signs/Symptoms:Chest Pain; Risk                 Factors:Hypertension, Dyslipidemia and Non-Smoker. HOCM                 (hypertrophic obstructive cardiomyopathy, Rheumatoid arthritis ,                 Elevated troponin.  Sonographer:    Jeryl Columbia RDCS (AE) Referring Phys: [id number] Ellsworth Lennox IMPRESSIONS  1. There is severe basal septal hypertophy. Combined with a hyperdynamic LV this creates SAM of the anterior mitral valve leaflet and a dynamic subvalvular gradient. Peak gradient with Valsalva.. Left ventricular ejection fraction, by estimation, is 65 to 70%. The left ventricle has hyperdynamic  function. The left ventrical has no regional wall motion abnormalities. There is Severe basal septal hypertophy left ventricular hypertrophy. Left ventricular diastolic parameters are consistent with Grade II diastolic dysfunction (pseudonormalization). Elevated left atrial pressure.  2. Right ventricular systolic function is normal. The right ventricular size is normal. There is mildly elevated pulmonary artery systolic pressure.  3. Left atrial size was severely dilated. No left atrial/left atrial appendage thrombus was detected.  4. Right atrial size was mildly dilated.  5. The mitral valve is normal in structure and function. Mild mitral valve regurgitation. No evidence of mitral stenosis.  6. The aortic valve has an indeterminant number of cusps. Aortic valve regurgitation is not visualized. Difficult to assess aortic valve given subvalvular gradient. Moprhologically looks to be mild aortic stenosis.  7. The inferior vena cava is normal in size with greater than 50% respiratory variability, suggesting right atrial pressure of 3 mmHg. FINDINGS  Left Ventricle: There is severe basal septal hypertophy. Combined with a hyperdynamic LV this creates SAM of the anterior mitral valve leaflet and a dynamic subvalvular gradient. Peak gradient with Valsalva. Left ventricular ejection fraction, by estimation, is 65 to 70%. The left ventricle has hyperdynamic function. The left ventricle has no regional wall motion abnormalities. There is Severe basal septal hypertophy left ventricular hypertrophy. Left ventricular diastolic parameters are consistent with Grade II diastolic dysfunction (pseudonormalization). Right Ventricle:  The right ventricular size is normal. No increase in right ventricular wall thickness. Right ventricular systolic function is normal. There is mildly elevated pulmonary artery systolic pressure. The tricuspid regurgitant velocity is 2.41  m/s, and with an assumed right atrial pressure of 10  mmHg, the estimated right ventricular systolic pressure is 33.2 mmHg. Left Atrium: Left atrial size was severely dilated. Right Atrium: Right atrial size was mildly dilated. Pericardium: There is no evidence of pericardial effusion. Mitral Valve: The mitral valve is normal in structure and function. Mild mitral valve regurgitation. No evidence of mitral valve stenosis. Tricuspid Valve: The tricuspid valve is normal in structure. Tricuspid valve regurgitation is not demonstrated. No evidence of tricuspid stenosis. Aortic Valve: The aortic valve has an indeterminant number of cusps. . There is mild thickening and mild calcification of the aortic valve. Aortic valve regurgitation is not visualized. Difficult to assess aortic valve given subvalvular gradient. Moprhologically looks to be mild aortic stenosis. Mild aortic valve annular calcification. There is mild thickening of the aortic valve. There is mild calcification of the aortic valve. Aortic valve mean gradient measures 21.2 mmHg. Aortic valve peak gradient measures 43.4 mmHg. Pulmonic Valve: The pulmonic valve was not well visualized. Pulmonic valve regurgitation is not visualized. No evidence of pulmonic stenosis. Aorta: The aortic root is normal in size and structure. Pulmonary Artery: Indeterminate PASP, inadequate TR jet. Venous: The inferior vena cava is normal in size with greater than 50% respiratory variability, suggesting right atrial pressure of 3 mmHg. IAS/Shunts: No atrial level shunt detected by color flow Doppler.  LEFT VENTRICLE PLAX 2D LVIDd:         3.76 cm  Diastology LVIDs:         2.65 cm  LV e' lateral:   6.97 cm/s LV PW:         1.36 cm  LV E/e' lateral: 17.1 LV IVS:        1.34 cm  LV e' medial:    5.27 cm/s LVOT diam:     1.90 cm  LV E/e' medial:  22.6 LV SV Index:   17.72 LVOT Area:     2.84 cm  RIGHT VENTRICLE RV S prime:     10.10 cm/s TAPSE (M-mode): 2.1 cm LEFT ATRIUM             Index       RIGHT ATRIUM           Index LA diam:         4.00 cm 2.16 cm/m  RA Area:     10.50 cm LA Vol (A2C):   97.9 ml 52.94 ml/m RA Volume:   25.40 ml  13.73 ml/m LA Vol (A4C):   83.3 ml 45.04 ml/m LA Biplane Vol: 91.9 ml 49.69 ml/m  AORTIC VALVE AV Vmax:      329.58 cm/s AV Vmean:     212.751 cm/s AV VTI:       0.622 m AV Peak Grad: 43.4 mmHg AV Mean Grad: 21.2 mmHg  AORTA Ao Root diam: 2.40 cm MITRAL VALVE                         TRICUSPID VALVE MV Area (PHT): 4.15 cm              TR Peak grad:   23.2 mmHg MV Decel Time: 183 msec              TR Vmax:  241.00 cm/s MV E velocity: 119.00 cm/s 103 cm/s MV A velocity: 84.40 cm/s  70.3 cm/s SHUNTS MV E/A ratio:  1.41        1.5       Systemic Diam: 1.90 cm Dina Rich MD Electronically signed by Dina Rich MD Signature Date/Time: 08/07/2019/12:33:33 PM    Final     Micro Results   Recent Results (from the past 240 hour(s))  SARS CORONAVIRUS 2 (TAT 6-24 HRS) Nasopharyngeal Nasopharyngeal Swab     Status: None   Collection Time: 08/06/19  4:42 AM   Specimen: Nasopharyngeal Swab  Result Value Ref Range Status   SARS Coronavirus 2 NEGATIVE NEGATIVE Final    Comment: (NOTE) SARS-CoV-2 target nucleic acids are NOT DETECTED. The SARS-CoV-2 RNA is generally detectable in upper and lower respiratory specimens during the acute phase of infection. Negative results do not preclude SARS-CoV-2 infection, do not rule out co-infections with other pathogens, and should not be used as the sole basis for treatment or other patient management decisions. Negative results must be combined with clinical observations, patient history, and epidemiological information. The expected result is Negative. Fact Sheet for Patients: HairSlick.no Fact Sheet for Healthcare Providers: quierodirigir.com This test is not yet approved or cleared by the [place] FDA and  has been authorized for detection and/or diagnosis of SARS-CoV-2 by FDA under an  Emergency Use Authorization (EUA). This EUA will remain  in effect (meaning this test can be used) for the duration of the COVID-19 declaration under Section 56 4(b)(1) of the Act, 21 U.S.C. section 360bbb-3(b)(1), unless the authorization is terminated or revoked sooner. Performed at [facility], [address], [place], [place] [address]     Today   Subjective    Odena Tellado today has no new complaints, -Chest pain-free, no dyspnea on exertion, no palpitations no dizziness          Patient has been seen and examined prior to discharge   Objective   Blood pressure (!) 142/81, pulse 85, temperature 99.6 F (37.6 C), temperature source Oral, resp. rate 18, height 5\' 2"  (1.575 m), weight 83.9 kg, SpO2 99 %.   Intake/Output Summary (Last 24 hours) at 08/07/2019 1430 Last data filed at 08/07/2019 0900 Gross per 24 hour  Intake 480 ml  Output --  Net 480 ml    Exam Gen:- Awake Alert, no acute distress  HEENT:- [place].AT, No sclera icterus Neck-Supple Neck,No JVD,.  Lungs-  CTAB , good air movement bilaterally  CV- S1, S2 normal, regular, 3/6 SM Abd-  +ve B.Sounds, Abd Soft, No tenderness,    Extremity/Skin:- No  edema,   good pulses Psych-affect is appropriate, oriented x3 Neuro-no new focal deficits, no tremors    Data Review   CBC w Diff:  Lab Results  Component Value Date   WBC 7.0 08/07/2019   HGB 10.3 (L) 08/07/2019   HCT 31.8 (L) 08/07/2019   PLT 198 08/07/2019   LYMPHOPCT 16 08/06/2019   MONOPCT 10 08/06/2019   EOSPCT 2 08/06/2019   BASOPCT 0 08/06/2019    CMP:  Lab Results  Component Value Date   NA 138 08/07/2019   K 3.9 08/07/2019   CL 108 08/07/2019   CO2 25 08/07/2019   BUN 14 08/07/2019   CREATININE 0.85 08/07/2019   PROT 5.6 (L) 08/07/2019   ALBUMIN 2.9 (L) 08/07/2019   BILITOT 1.0 08/07/2019   ALKPHOS 42 08/07/2019   AST 14 (L) 08/07/2019   ALT 13 08/07/2019  .  Total Discharge time is about 33 minutes  Shon Hale M.D  on 08/07/2019 at 2:30 PM  Go to [email or web address] -  for contact info  [facility] - Office  [phone]

## 2019-08-07 NOTE — Progress Notes (Signed)
*  PRELIMINARY RESULTS* Echocardiogram 2D Echocardiogram has been performed.  Dana [name] 08/07/2019, 10:56 AM

## 2019-08-14 DIAGNOSIS — R5383 Other fatigue: Secondary | ICD-10-CM | POA: Insufficient documentation

## 2019-08-14 DIAGNOSIS — R238 Other skin changes: Secondary | ICD-10-CM | POA: Insufficient documentation

## 2019-08-14 DIAGNOSIS — R040 Epistaxis: Secondary | ICD-10-CM | POA: Insufficient documentation

## 2019-08-14 DIAGNOSIS — R0600 Dyspnea, unspecified: Secondary | ICD-10-CM | POA: Insufficient documentation

## 2019-08-16 NOTE — Progress Notes (Signed)
Virtual Visit via Telephone Note   This visit type was conducted due to national recommendations for restrictions regarding the COVID-19 Pandemic (e.g. social distancing) in an effort to limit this patient's exposure and mitigate transmission in our community.  Due to her co-morbid illnesses, this patient is at least at moderate risk for complications without adequate follow up.  This format is felt to be most appropriate for this patient at this time.  The patient did not have access to video technology/had technical difficulties with video requiring transitioning to audio format only (telephone).  All issues noted in this document were discussed and addressed.  No physical exam could be performed with this format.  Please refer to the patient's chart for her  consent to telehealth for [facility].   Date:  08/19/2019   ID:  Dana, [name] 06-Oct-1940, MRN [id number]  Patient Location: Home Provider Location: Office  PCP:  [name], MD  Cardiologist:  Nona Dell, MD  Electrophysiologist:  None   Evaluation Performed:  Follow-Up Visit  Chief Complaint: Hospital Follow-up  History of Present Illness:    Dana [name] is a 79 y.o. female with past medical history of CAD (s/p DES to OM1 in 02/2017), HOCM, HTN and HLD who presents for a hospital follow-up telehealth visit.  She most recently presented [facility] earlier this month for evaluation of chest pain and dyspnea which started the day prior. She also reported having frequent nosebleeds and could go through 2 to 3 boxes of Kleenex with one episode. HS Troponin values were elevated but peaked at 225. BNP was at 250. She was hypotensive upon admission and given concern for dehydration in the setting of HOCM, she received IVF in the ED with improvement in her pain and dyspnea. Repeat echocardiogram showed severe basal septal hypertrophy with hyperdynamic LV with a dynamic subvalvular gradient. EF was preserved at 65 to 70%  with no regional wall motion normalities. She did have grade 2 diastolic dysfunction and biatrial dilation along with mild AS. She had used Lasix prior to admission due to worsening edema and it was felt that this led to worsening of her subvalvular gradient and contributed to her symptoms.  Coreg was transitioned to Toprol-XL for less BP effects and less afterload effects. Plavix was also discontinued given the timeframe since her last stent and she was continued on ASA.  In talking with the patient today, her son does assist with translation and they declined an interpreter. She has  done well since her hospitalization and has not experienced any recurrent episodes of chest pain. Breathing has been at baseline. He reports that she is not active during the day and mostly stays in the house. No reports of orthopnea, PND, lower extremity edema or palpitations.  She did have a recurrent episode of epistaxis after leaving the hospital last week but denies any recurrent symptoms since.  The patient does not have symptoms concerning for COVID-19 infection (fever, chills, cough, or new shortness of breath).   Past Medical History:  Diagnosis Date  . CAD (coronary artery disease)    a. s/p DES to OM1 in 02/2017  . Essential hypertension   . Gout   . HOCM (hypertrophic obstructive cardiomyopathy) (HCC)   . Hyperlipidemia   . Rheumatoid arthritis [facility])    Past Surgical History:  Procedure Laterality Date  . CHOLECYSTECTOMY N/A 08/17/2018   Procedure: LAPAROSCOPIC CHOLECYSTECTOMY;  Surgeon: [name], MD;  Location: [facility];  Service: General;  Laterality:  N/A;  . CORONARY BALLOON ANGIOPLASTY N/A 03/03/2017   Procedure: CORONARY BALLOON ANGIOPLASTY;  Surgeon: Leonie Man, MD;  Location: [place] [facility];  Service: Cardiovascular;  Laterality: N/A;  . CORONARY STENT INTERVENTION N/A 03/03/2017   Procedure: CORONARY STENT INTERVENTION;  Surgeon: Leonie Man, MD;  Location: [place] [facility];   Service: Cardiovascular;  Laterality: N/A;  . LEFT HEART CATH AND CORONARY ANGIOGRAPHY N/A 03/02/2017   Procedure: LEFT HEART CATH AND CORONARY ANGIOGRAPHY;  Surgeon: Sherren Mocha, MD;  Location: [place] [facility] LAB;  Service: Cardiovascular;  Laterality: N/A;     No outpatient medications have been marked as taking for the 08/19/19 encounter (Appointment) with Erma Heritage, PA-C.     Allergies:   Patient has no known allergies.   Social History   Tobacco Use  . Smoking status: Never Smoker  . Smokeless tobacco: Never Used  Substance Use Topics  . Alcohol use: No  . Drug use: No     Family Hx: The patient's family history includes Alcohol abuse in her father; Hypertension in her mother; Liver disease in her mother.  ROS:   Please see the history of present illness.     All other systems reviewed and are negative.   Prior CV studies:   The following studies were reviewed today:  Cardiac Catheterization: 02/2017 1. Severe single-vessel coronary artery disease involving a large first obtuse marginal branch of the left dominant circumflex 2. Moderate LAD stenosis 3. Small nondominant RCA with no significant obstruction 4. Mild to moderate left main stenosis 5. Known hypertrophic cardiomyopathy  Recommend PCI of the obtuse marginal. 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 The patient will be loaded with Plavix. Will schedule her PCI for tomorrow. Would use femoral artery access and consider either a 5 [place] guide catheter or a guide catheter with sideholes. Plan discussed at length with the patient's daughter. Note the patient is non-English speaking.   Coronary Stent Intervention: 02/2017  Ost LM lesion, 40 %stenosed.  1st Mrg lesion, 90 %stenosed.  A STENT SIERRA 3.00 X 12 MM drug eluting stent was successfully placed.  Post intervention, there is a 0% residual stenosis.  Lat 1st Mrg lesion, 60  %stenosed. Bifurcation lesion jailed by the initial stent  Post intervention - rescue PTCA only, there is a 20% residual stenosis.   Successful bifurcation PCI of large OM1 with major branch of OM1 PTCA. A Xience [place] DES stent was used for the main branch and then PTCA was performed through the stent into the side branch for ostial stenosis.  Plan:  Transfer to [facility]) for sheath removal  Continue dual antiplatelet therapy for one year (would be okay to stop aspirin after 3 months if necessary)  Anticipate that she should be ready for discharge tomorrow   Echocardiogram: 08/07/2019 IMPRESSIONS    1. There is severe basal septal hypertophy. Combined with a hyperdynamic  LV this creates SAM of the anterior mitral valve leaflet and a dynamic  subvalvular gradient. Peak gradient 152mmHg with Valsalva.. Left  ventricular ejection fraction, by  estimation, is 65 to 70%. The left ventricle has hyperdynamic function.  The left ventrical has no regional wall motion abnormalities. There is  Severe basal septal hypertophy left ventricular hypertrophy. Left  ventricular diastolic parameters are  consistent with Grade II diastolic dysfunction (pseudonormalization).  Elevated left atrial pressure.  2. Right ventricular systolic function is normal. The  right ventricular  size is normal. There is mildly elevated pulmonary artery systolic  pressure.  3. Left atrial size was severely dilated. No left atrial/left atrial  appendage thrombus was detected.  4. Right atrial size was mildly dilated.  5. The mitral valve is normal in structure and function. Mild mitral  valve regurgitation. No evidence of mitral stenosis.  6. The aortic valve has an indeterminant number of cusps. Aortic valve  regurgitation is not visualized. Difficult to assess aortic valve given  subvalvular gradient. Moprhologically looks to be mild aortic stenosis.  7. The inferior vena cava is normal  in size with greater than 50%  respiratory variability, suggesting right atrial pressure of 3 mmHg.   Labs/Other Tests and Data Reviewed:    EKG:  No ECG reviewed.  Recent Labs: 08/06/2019: B Natriuretic Peptide 250.0 08/07/2019: ALT 13; BUN 14; Creatinine, Ser 0.85; Hemoglobin 10.3; Platelets 198; Potassium 3.9; Sodium 138   Recent Lipid Panel Lab Results  Component Value Date/Time   CHOL 123 09/17/2017 06:27 AM   TRIG 69 09/17/2017 06:27 AM   HDL 49 09/17/2017 06:27 AM   CHOLHDL 2.5 09/17/2017 06:27 AM   LDLCALC 60 09/17/2017 06:27 AM    Wt Readings from Last 3 Encounters:  08/05/19 185 lb (83.9 kg)  05/17/19 186 lb (84.4 kg)  08/30/18 173 lb 9.6 oz (78.7 kg)     Objective:    Vital Signs:  There were no vitals taken for this visit.   General: Pleasant female sounding in NAD Psych: Normal affect. Neuro: Alert and oriented X 3.  Lungs:  Resp regular and unlabored while talking on the phone.   ASSESSMENT & PLAN:    1. HOCM - She was recently hospitalized for worsening chest pain and dyspnea which was thought to be secondary to dehydration in the setting of HOCM as symptoms resolved with administration of IVF. As needed Lasix was discontinued at the time of hospital discharge and she denies any evidence of volume overload. - She is currently on Toprol-XL 25 mg daily and Verapamil CR 240 mg daily. Will plan to titrate Toprol-XL to 50 mg daily in the setting of elevated BP.  2. CAD - s/p DES to OM1 in 02/2017. She denies any recurrent anginal symptoms since hospital discharge. Echocardiogram at that time showed a preserved EF as outlined above. - Continue ASA, statin and beta-blocker therapy. She is no longer on Plavix as this was discontinued during her admission secondary to epistaxis and being greater than 1 year out from prior stent placement.  3. HTN - BP was elevated at 167/72 on most recent check. Her son has a cuff but the patient does not and he asks if one is  available. Will reach out to Social Work to see if one can be sent to her home. - Will continue Verapamil CR 240 mg daily but titrate Toprol-XL from 25 mg daily to 50 mg daily.  4. HLD - Followed by PCP.  Will request a copy of most recent records. Goal LDL is less than 70 in the setting of known CAD. LDL was 60 in 2019. She remains on Atorvastatin 10 mg daily and Zetia 10 mg daily.    COVID-19 Education: The signs and symptoms of COVID-19 were discussed with the patient and how to seek care for testing (follow up with PCP or arrange E-visit).  The importance of social distancing was discussed today.  Time:   Today, I have spent 14 minutes with the patient with  telehealth technology discussing the above problems.     Medication Adjustments/Labs and Tests Ordered: Current medicines are reviewed at length with the patient today.  Concerns regarding medicines are outlined above.   Tests Ordered: No orders of the defined types were placed in this encounter.   Medication Changes: No orders of the defined types were placed in this encounter.   Follow Up:  In Person in 3 month(s)  Signed, Carlyon Prows  08/19/2019 12:43 PM    [facility]

## 2019-08-19 ENCOUNTER — Encounter: Payer: Self-pay | Admitting: Student

## 2019-08-19 ENCOUNTER — Telehealth (INDEPENDENT_AMBULATORY_CARE_PROVIDER_SITE_OTHER): Payer: Medicare Other | Admitting: Student

## 2019-08-19 VITALS — BP 167/72 | HR 79 | Ht 62.0 in | Wt 188.0 lb

## 2019-08-19 DIAGNOSIS — I25119 Atherosclerotic heart disease of native coronary artery with unspecified angina pectoris: Secondary | ICD-10-CM

## 2019-08-19 DIAGNOSIS — E785 Hyperlipidemia, unspecified: Secondary | ICD-10-CM

## 2019-08-19 DIAGNOSIS — I421 Obstructive hypertrophic cardiomyopathy: Secondary | ICD-10-CM | POA: Diagnosis not present

## 2019-08-19 DIAGNOSIS — I10 Essential (primary) hypertension: Secondary | ICD-10-CM

## 2019-08-19 MED ORDER — NITROGLYCERIN 0.4 MG SL SUBL: 0.4000 mg | SUBLINGUAL_TABLET | SUBLINGUAL | 2 refills | Status: DC | PRN

## 2019-08-19 MED ORDER — METOPROLOL SUCCINATE ER 50 MG PO TB24: 50.0000 mg | ORAL_TABLET | Freq: Every day | ORAL | 3 refills | Status: DC

## 2019-08-19 MED ORDER — VERAPAMIL HCL ER 240 MG PO TBCR: 240.0000 mg | EXTENDED_RELEASE_TABLET | Freq: Every day | ORAL | 11 refills | Status: DC

## 2019-08-19 NOTE — Patient Instructions (Signed)
Medication Instructions:  Your physician recommends that you continue on your current medications as directed. Please refer to the Current Medication list given to you today.   Increase Toprol XL to 50 mg Daily    *If you need a refill on your cardiac medications before your next appointment, please call your pharmacy*  Lab Work: NONE  If you have labs (blood work) drawn today and your tests are completely normal, you will receive your results only by: [name] MyChart Message (if you have MyChart) OR . A paper copy in the mail If you have any lab test that is abnormal or we need to change your treatment, we will call you to review the results.  Testing/Procedures: NONE   Follow-Up: At [facility], you and your health needs are our priority.  As part of our continuing mission to provide you with exceptional heart care, we have created designated Provider Care Teams.  These Care Teams include your primary Cardiologist (physician) and Advanced Practice Providers (APPs -  Physician Assistants and Nurse Practitioners) who all work together to provide you with the care you need, when you need it.  Your next appointment:   3 month(s)  The format for your next appointment:   In Person  Provider:   Nona Dell, MD  Other Instructions Thank you for choosing [facility]!

## 2019-08-20 ENCOUNTER — Telehealth: Payer: Self-pay | Admitting: Licensed Clinical Social Worker

## 2019-08-20 NOTE — Telephone Encounter (Signed)
CSW referred to assist patient with obtaining a BP cuff. CSW contacted patient to inform cuff will be delivered to home. Patient grateful for support and assistance. CSW available as needed. Jackie Brannan Cassedy, LCSW, CCSW-MCS [phone]  

## 2019-11-19 ENCOUNTER — Other Ambulatory Visit: Payer: Self-pay | Admitting: Student

## 2019-11-19 ENCOUNTER — Telehealth: Payer: Self-pay

## 2019-11-19 NOTE — Telephone Encounter (Signed)
  Patient Consent for Virtual Visit         Dana [name] has provided verbal consent on 11/19/2019 for a virtual visit (video or telephone).   CONSENT FOR VIRTUAL VISIT FOR:  Dana [name]  By participating in this virtual visit I agree to the following:  I hereby voluntarily request, consent and authorize [facility] and its employed or contracted physicians, Producer, television/film/video, nurse practitioners or other licensed health care professionals (the Practitioner), to provide me with telemedicine health care services (the "Services") as deemed necessary by the treating Practitioner. I acknowledge and consent to receive the Services by the Practitioner via telemedicine. I understand that the telemedicine visit will involve communicating with the Practitioner through live audiovisual communication technology and the disclosure of certain medical information by electronic transmission. I acknowledge that I have been given the opportunity to request an in-person assessment or other available alternative prior to the telemedicine visit and am voluntarily participating in the telemedicine visit.  I understand that I have the right to withhold or withdraw my consent to the use of telemedicine in the course of my care at any time, without affecting my right to future care or treatment, and that the Practitioner or I may terminate the telemedicine visit at any time. I understand that I have the right to inspect all information obtained and/or recorded in the course of the telemedicine visit and may receive copies of available information for a reasonable fee.  I understand that some of the potential risks of receiving the Services via telemedicine include:  [name] Delay or interruption in medical evaluation due to technological equipment failure or disruption; . Information transmitted may not be sufficient (e.g. poor resolution of images) to allow for appropriate medical decision making by the Practitioner;  and/or  . In rare instances, security protocols could fail, causing a breach of personal health information.  Furthermore, I acknowledge that it is my responsibility to provide information about my medical history, conditions and care that is complete and accurate to the best of my ability. I acknowledge that Practitioner's advice, recommendations, and/or decision may be based on factors not within their control, such as incomplete or inaccurate data provided by me or distortions of diagnostic images or specimens that may result from electronic transmissions. I understand that the practice of medicine is not an exact science and that Practitioner makes no warranties or guarantees regarding treatment outcomes. I acknowledge that a copy of this consent can be made available to me via my patient portal [facility] MyChart), or I can request a printed copy by calling the office of [facility].    I understand that my insurance will be billed for this visit.   I have read or had this consent read to me. . I understand the contents of this consent, which adequately explains the benefits and risks of the Services being provided via telemedicine.  . I have been provided ample opportunity to ask questions regarding this consent and the Services and have had my questions answered to my satisfaction. . I give my informed consent for the services to be provided through the use of telemedicine in my medical care

## 2019-11-20 ENCOUNTER — Telehealth (INDEPENDENT_AMBULATORY_CARE_PROVIDER_SITE_OTHER): Payer: Medicare Other | Admitting: Cardiology

## 2019-11-20 ENCOUNTER — Encounter: Payer: Self-pay | Admitting: Cardiology

## 2019-11-20 VITALS — BP 137/71 | HR 62 | Ht 62.0 in | Wt 181.0 lb

## 2019-11-20 DIAGNOSIS — I25119 Atherosclerotic heart disease of native coronary artery with unspecified angina pectoris: Secondary | ICD-10-CM

## 2019-11-20 DIAGNOSIS — I421 Obstructive hypertrophic cardiomyopathy: Secondary | ICD-10-CM

## 2019-11-20 NOTE — Progress Notes (Signed)
Virtual Visit via Telephone Note   This visit type was conducted due to national recommendations for restrictions regarding the COVID-19 Pandemic (e.g. social distancing) in an effort to limit this patient's exposure and mitigate transmission in our community.  Due to her co-morbid illnesses, this patient is at least at moderate risk for complications without adequate follow up.  This format is felt to be most appropriate for this patient at this time.  The patient did not have access to video technology/had technical difficulties with video requiring transitioning to audio format only (telephone).  All issues noted in this document were discussed and addressed.  No physical exam could be performed with this format.  Please refer to the patient's chart for her  consent to telehealth for [facility].   The patient was identified using 2 identifiers.  Date:  11/20/2019   ID:  Dana, [name] 11/29/1940, MRN [id number]  Patient Location: Home Provider Location: Office  PCP:  [name], MD  Cardiologist:  Rozann Lesches, MD Electrophysiologist:  None   Evaluation Performed:  Follow-Up Visit  Chief Complaint:   Cardiac follow-up  History of Present Illness:    Dana [name] is a 79 y.o. female last assessed via telehealth encounter in February.  I spoke with her son today, present with her at home.  She was hospitalized in February at which point follow-up echocardiogram was obtained and medications were adjusted.  LVEF was 65 to 70% at that time with dynamic LVOT gradient consistent with her known HOCM, moderate diastolic dysfunction, also mild aortic stenosis.  She has been doing well, no reported recurring chest pain or breathlessness, no palpitations or syncope.  I reviewed her medications which are outlined below.  She reports follow-up with PCP in the last month.  She has had the coronavirus vaccine.  Past Medical History:  Diagnosis Date  . CAD (coronary artery disease)    a. s/p DES to OM1 in 02/2017  . Essential hypertension   . Gout   . HOCM (hypertrophic obstructive cardiomyopathy) ([place])   . Hyperlipidemia   . Rheumatoid arthritis [facility])    Past Surgical History:  Procedure Laterality Date  . CHOLECYSTECTOMY N/A 08/17/2018   Procedure: LAPAROSCOPIC CHOLECYSTECTOMY;  Surgeon: Aviva Signs, MD;  Location: [facility];  Service: General;  Laterality: N/A;  . CORONARY BALLOON ANGIOPLASTY N/A 03/03/2017   Procedure: CORONARY BALLOON ANGIOPLASTY;  Surgeon: Leonie Man, MD;  Location: [place] CV LAB;  Service: Cardiovascular;  Laterality: N/A;  . CORONARY STENT INTERVENTION N/A 03/03/2017   Procedure: CORONARY STENT INTERVENTION;  Surgeon: Leonie Man, MD;  Location: [place] CV LAB;  Service: Cardiovascular;  Laterality: N/A;  . LEFT HEART CATH AND CORONARY ANGIOGRAPHY N/A 03/02/2017   Procedure: LEFT HEART CATH AND CORONARY ANGIOGRAPHY;  Surgeon: Sherren Mocha, MD;  Location: [place] CV LAB;  Service: Cardiovascular;  Laterality: N/A;     Current Meds  Medication Sig  . acetaminophen (TYLENOL) 325 MG tablet Take 2 tablets (650 mg total) by mouth every 6 (six) hours as needed for mild pain, fever or headache (or Fever >/= 101).  [name] alendronate (FOSAMAX) 70 MG tablet Take 70 mg by mouth once a week.  . ALLERGY RELIEF 10 MG tablet Take 10 mg by mouth daily.  [name] allopurinol (ZYLOPRIM) 300 MG tablet Take 300 mg by mouth daily.   [name] aspirin EC 81 MG tablet Take 1 tablet (81 mg total) by mouth daily with breakfast.  . atorvastatin (LIPITOR) 10 MG tablet  Take 10 mg by mouth daily.  . calcium gluconate 500 MG tablet Take 1 tablet by mouth 2 (two) times daily.  . Cholecalciferol (VITAMIN D3) 25 MCG (1000 UT) CAPS Take 1 capsule by mouth daily.   [name] ezetimibe (ZETIA) 10 MG tablet Take 10 mg by mouth daily.  . famotidine (PEPCID) 20 MG tablet Take 1 tablet (20 mg total) by mouth 2 (two) times daily.  . ferrous sulfate (FEROSUL) 325 (65 FE) MG tablet Take 325  mg by mouth daily with breakfast.  . folic acid (FOLVITE) 1 MG tablet Take 1 mg by mouth daily.  . methotrexate 2.5 MG tablet Take 20 mg by mouth every Wednesday.   . metoprolol succinate (TOPROL-XL) 50 MG 24 hr tablet TAKE 1 TABLET BY MOUTH ONCE A DAY FOR HEART AND BLOOD PRESSURE.  . nitroGLYCERIN (NITROSTAT) 0.4 MG SL tablet Place 1 tablet (0.4 mg total) under the tongue every 5 (five) minutes as needed for chest pain.  . pantoprazole (PROTONIX) 40 MG tablet Take 1 tablet (40 mg total) by mouth daily.  . prednisoLONE acetate (PRED FORTE) 1 % ophthalmic suspension Place 1 drop into the right eye every 2 (two) hours.  . RESTASIS 0.05 % ophthalmic emulsion Place 1 drop into both eyes 2 (two) times daily.  . sucralfate (CARAFATE) 1 g tablet Take 1 g by mouth 2 (two) times daily.  . verapamil (CALAN-SR) 240 MG CR tablet Take 1 tablet (240 mg total) by mouth daily.     Allergies:   Patient has no known allergies.   ROS:   No dizziness or syncope.  Prior CV studies:   The following studies were reviewed today:  Echocardiogram 08/07/2019: 1. There is severe basal septal hypertophy. Combined with a hyperdynamic  LV this creates SAM of the anterior mitral valve leaflet and a dynamic  subvalvular gradient. Peak gradient with Valsalva.. Left  ventricular ejection fraction, by  estimation, is 65 to 70%. The left ventricle has hyperdynamic function.  The left ventrical has no regional wall motion abnormalities. There is  Severe basal septal hypertophy left ventricular hypertrophy. Left  ventricular diastolic parameters are  consistent with Grade II diastolic dysfunction (pseudonormalization).  Elevated left atrial pressure.  2. Right ventricular systolic function is normal. The right ventricular  size is normal. There is mildly elevated pulmonary artery systolic  pressure.  3. Left atrial size was severely dilated. No left atrial/left atrial  appendage thrombus was detected.  4.  Right atrial size was mildly dilated.  5. The mitral valve is normal in structure and function. Mild mitral  valve regurgitation. No evidence of mitral stenosis.  6. The aortic valve has an indeterminant number of cusps. Aortic valve  regurgitation is not visualized. Difficult to assess aortic valve given  subvalvular gradient. Moprhologically looks to be mild aortic stenosis.  7. The inferior vena cava is normal in size with greater than 50%  respiratory variability, suggesting right atrial pressure of 3 mmHg.   Labs/Other Tests and Data Reviewed:    EKG:  An ECG dated 08/06/2019 was personally reviewed today and demonstrated:  Probable junctional rhythm with sinus bradycardia and isorhythmic dissociation.  Recent Labs: 08/06/2019: B Natriuretic Peptide 250.0 08/07/2019: ALT 13; BUN 14; Creatinine, Ser 0.85; Hemoglobin 10.3; Platelets 198; Potassium 3.9; Sodium 138   Recent Lipid Panel Lab Results  Component Value Date/Time   CHOL 123 09/17/2017 06:27 AM   TRIG 69 09/17/2017 06:27 AM   HDL 49 09/17/2017 06:27 AM   CHOLHDL  2.5 09/17/2017 06:27 AM   LDLCALC 60 09/17/2017 06:27 AM    Wt Readings from Last 3 Encounters:  11/20/19 181 lb (82.1 kg)  08/19/19 188 lb (85.3 kg)  08/05/19 185 lb (83.9 kg)     Objective:    Vital Signs:  BP 137/71   Pulse 62   Ht 5\' 2"  (1.575 m)   Wt 181 lb (82.1 kg)   BMI 33.11 kg/m    I spoke with the patient's son during the encounter.  ASSESSMENT & PLAN:    1.  Hypertrophic obstructive cardiomyopathy, follow-up echocardiogram from February is reviewed above.  She is doing well at this time without recurring chest pain, no palpitations or syncope.  She continues on dual AV nodal blockers including Toprol-XL and Calan SR.  We discussed the importance of avoiding dehydration or regular use of medications that would deplete intravascular volume.  2.  CAD status post DES to the first obtuse marginal in September 2018.  Continue aspirin, Toprol-XL,  Lipitor, and Zetia.   Time:   Today, I have spent 6 minutes with the patient with telehealth technology discussing the above problems.     Medication Adjustments/Labs and Tests Ordered: Current medicines are reviewed at length with the patient today.  Concerns regarding medicines are outlined above.   Tests Ordered: No orders of the defined types were placed in this encounter.   Medication Changes: No orders of the defined types were placed in this encounter.   Follow Up:  In Person 6 months in the [place] office.  Signed, Garrison, MD  11/20/2019 1:40 PM    [facility]

## 2019-11-20 NOTE — Patient Instructions (Signed)
Medication Instructions:  °Your physician recommends that you continue on your current medications as directed. Please refer to the Current Medication list given to you today. ° °*If you need a refill on your cardiac medications before your next appointment, please call your pharmacy* ° ° °Lab Work: °None today °If you have labs (blood work) drawn today and your tests are completely normal, you will receive your results only by: °• MyChart Message (if you have MyChart) OR °• A paper copy in the mail °If you have any lab test that is abnormal or we need to change your treatment, we will call you to review the results. ° ° °Testing/Procedures: °None today ° ° °Follow-Up: °At [facility], you and your health needs are our priority.  As part of our continuing mission to provide you with exceptional heart care, we have created designated Provider Care Teams.  These Care Teams include your primary Cardiologist (physician) and Advanced Practice Providers (APPs -  Physician Assistants and Nurse Practitioners) who all work together to provide you with the care you need, when you need it. ° °We recommend signing up for the patient portal called "MyChart".  Sign up information is provided on this After Visit Summary.  MyChart is used to connect with patients for Virtual Visits (Telemedicine).  Patients are able to view lab/test results, encounter notes, upcoming appointments, etc.  Non-urgent messages can be sent to your provider as well.   °To learn more about what you can do with MyChart, go to [email or web address].   ° °Your next appointment:   °6 month(s) ° °The format for your next appointment:   °In Person ° °Provider:   °Samuel McDowell, MD ° ° °Other Instructions °None ° ° ° ° °Thank you for choosing [facility] ! ° ° ° ° ° ° ° ° °

## 2020-03-06 ENCOUNTER — Other Ambulatory Visit (HOSPITAL_COMMUNITY): Payer: Self-pay | Admitting: Internal Medicine

## 2020-03-06 DIAGNOSIS — Z1231 Encounter for screening mammogram for malignant neoplasm of breast: Secondary | ICD-10-CM

## 2020-03-06 DIAGNOSIS — Z13820 Encounter for screening for osteoporosis: Secondary | ICD-10-CM

## 2020-03-12 ENCOUNTER — Ambulatory Visit (HOSPITAL_COMMUNITY): Payer: Medicare Other

## 2020-03-18 ENCOUNTER — Ambulatory Visit (HOSPITAL_COMMUNITY)
Admission: RE | Admit: 2020-03-18 | Discharge: 2020-03-18 | Disposition: A | Discharge status: Home / Self Care | Payer: Medicare Other | Source: Ambulatory Visit | Attending: Internal Medicine | Admitting: Internal Medicine

## 2020-03-18 ENCOUNTER — Other Ambulatory Visit: Payer: Self-pay

## 2020-03-18 DIAGNOSIS — Z1231 Encounter for screening mammogram for malignant neoplasm of breast: Secondary | ICD-10-CM | POA: Diagnosis present

## 2020-04-10 ENCOUNTER — Emergency Department (HOSPITAL_COMMUNITY): Payer: Medicare Other

## 2020-04-10 ENCOUNTER — Encounter (HOSPITAL_COMMUNITY): Payer: Self-pay | Admitting: Emergency Medicine

## 2020-04-10 ENCOUNTER — Other Ambulatory Visit: Payer: Self-pay

## 2020-04-10 ENCOUNTER — Emergency Department (HOSPITAL_COMMUNITY)
Admission: EM | Admit: 2020-04-10 | Discharge: 2020-04-10 | Disposition: A | Discharge status: Home / Self Care | Payer: Medicare Other | Attending: Emergency Medicine | Admitting: Emergency Medicine

## 2020-04-10 DIAGNOSIS — I110 Hypertensive heart disease with heart failure: Secondary | ICD-10-CM | POA: Diagnosis not present

## 2020-04-10 DIAGNOSIS — Z7982 Long term (current) use of aspirin: Secondary | ICD-10-CM | POA: Insufficient documentation

## 2020-04-10 DIAGNOSIS — I25110 Atherosclerotic heart disease of native coronary artery with unstable angina pectoris: Secondary | ICD-10-CM | POA: Insufficient documentation

## 2020-04-10 DIAGNOSIS — R778 Other specified abnormalities of plasma proteins: Secondary | ICD-10-CM | POA: Insufficient documentation

## 2020-04-10 DIAGNOSIS — R0602 Shortness of breath: Secondary | ICD-10-CM | POA: Diagnosis present

## 2020-04-10 DIAGNOSIS — Z955 Presence of coronary angioplasty implant and graft: Secondary | ICD-10-CM | POA: Insufficient documentation

## 2020-04-10 DIAGNOSIS — I5023 Acute on chronic systolic (congestive) heart failure: Secondary | ICD-10-CM | POA: Diagnosis not present

## 2020-04-10 DIAGNOSIS — I5033 Acute on chronic diastolic (congestive) heart failure: Secondary | ICD-10-CM

## 2020-04-10 DIAGNOSIS — Z79899 Other long term (current) drug therapy: Secondary | ICD-10-CM | POA: Insufficient documentation

## 2020-04-10 DIAGNOSIS — Z20822 Contact with and (suspected) exposure to covid-19: Secondary | ICD-10-CM | POA: Diagnosis not present

## 2020-04-10 LAB — CBC WITH DIFFERENTIAL/PLATELET
Abs Immature Granulocytes: 0.01 10*3/uL (ref 0.00–0.07)
Basophils Absolute: 0 10*3/uL (ref 0.0–0.1)
Basophils Relative: 1 %
Eosinophils Absolute: 0.1 10*3/uL (ref 0.0–0.5)
Eosinophils Relative: 2 %
HCT: 38 % (ref 36.0–46.0)
Hemoglobin: 12.8 g/dL (ref 12.0–15.0)
Immature Granulocytes: 0 %
Lymphocytes Relative: 27 %
Lymphs Abs: 1.1 10*3/uL (ref 0.7–4.0)
MCH: 32.3 pg (ref 26.0–34.0)
MCHC: 33.7 g/dL (ref 30.0–36.0)
MCV: 96 fL (ref 80.0–100.0)
Monocytes Absolute: 0.4 10*3/uL (ref 0.1–1.0)
Monocytes Relative: 10 %
Neutro Abs: 2.4 10*3/uL (ref 1.7–7.7)
Neutrophils Relative %: 60 %
Platelets: 232 10*3/uL (ref 150–400)
RBC: 3.96 MIL/uL (ref 3.87–5.11)
RDW: 15.1 % (ref 11.5–15.5)
WBC: 4.1 10*3/uL (ref 4.0–10.5)
nRBC: 0 % (ref 0.0–0.2)

## 2020-04-10 LAB — BASIC METABOLIC PANEL
Anion gap: 8 (ref 5–15)
BUN: 20 mg/dL (ref 8–23)
CO2: 26 mmol/L (ref 22–32)
Calcium: 8.7 mg/dL — ABNORMAL LOW (ref 8.9–10.3)
Chloride: 104 mmol/L (ref 98–111)
Creatinine, Ser: 0.77 mg/dL (ref 0.44–1.00)
GFR, Estimated: >60 mL/min (ref >60)
Glucose, Bld: 98 mg/dL (ref 70–99)
Potassium: 4.1 mmol/L (ref 3.5–5.1)
Sodium: 138 mmol/L (ref 135–145)

## 2020-04-10 LAB — TROPONIN I (HIGH SENSITIVITY)
Troponin I (High Sensitivity): 107 ng/L (ref <18)
Troponin I (High Sensitivity): 110 ng/L (ref <18)

## 2020-04-10 LAB — RESPIRATORY PANEL BY RT PCR (FLU A&B, COVID)
Influenza A by PCR: NEGATIVE
Influenza B by PCR: NEGATIVE
SARS Coronavirus 2 by RT PCR: NEGATIVE

## 2020-04-10 LAB — BRAIN NATRIURETIC PEPTIDE: B Natriuretic Peptide: 201 pg/mL — ABNORMAL HIGH (ref 0.0–100.0)

## 2020-04-10 MED ORDER — FUROSEMIDE 10 MG/ML IJ SOLN
40.0000 mg | Freq: Once | INTRAMUSCULAR | Status: AC
  Administered 2020-04-10: 40 mg via INTRAVENOUS
  Filled 2020-04-10: qty 4

## 2020-04-10 MED ORDER — FUROSEMIDE 40 MG PO TABS: 40.0000 mg | ORAL_TABLET | Freq: Every day | ORAL | 0 refills | Status: DC

## 2020-04-10 NOTE — ED Triage Notes (Signed)
Patient does not speak any English (husband interprets for wife) Patient woke up from sleeping with some shortness of breath and some chest discomfort. Patient is not have any chest pain at this time. Patient does state some coughing.

## 2020-04-10 NOTE — Discharge Instructions (Signed)
Return if she is having any problems. 

## 2020-04-10 NOTE — ED Provider Notes (Signed)
[facility] EMERGENCY DEPARTMENT Provider Note   CSN: [id number] Arrival date & time: 04/10/20  0404   History Chief Complaint  Patient presents with  . Shortness of Breath    Dana [name] is a 79 y.o. female.  The history is provided by the patient and the spouse. A language interpreter was used.  Shortness of Breath She has she has history of hypertension, hyperlipidemia, coronary artery disease, diastolic heart failure, hypertrophic obstructive cardiomyopathy and comes in because of shortness of breath.  She woke up tonight with difficulty breathing.  She was fine during the day.  She denies chest pain, heaviness, tightness, pressure.  There has been a mild cough which is nonproductive.  She denies fever, chills, sweats.  Nothing makes her dyspnea better, nothing makes it worse.  Of note, she is on a diuretic but only takes it once a week.  Past Medical History:  Diagnosis Date  . CAD (coronary artery disease)    a. s/p DES to OM1 in 02/2017  . Essential hypertension   . Gout   . HOCM (hypertrophic obstructive cardiomyopathy) (HCC)   . Hyperlipidemia   . Rheumatoid arthritis [facility])     Patient Active Problem List   Diagnosis Date Noted  . Gallstone pancreatitis   . RUQ pain   . Acute on chronic diastolic CHF (congestive heart failure) (HCC) 01/26/2018  . Rheumatoid arthritis (HCC) 01/26/2018  . Elevated troponin 01/26/2018  . Acute lower UTI 01/26/2018  . Abdominal pain 01/26/2018  . GERD (gastroesophageal reflux disease) 01/26/2018  . Precordial chest pain   . CAD (coronary artery disease) 09/17/2017  . HOCM (hypertrophic obstructive cardiomyopathy) (HCC)   . Unstable angina (HCC)   . Coronary artery disease involving native coronary artery of native heart with unstable angina pectoris (HCC)   . Chest pain 01/29/2017  . Hypertrophic cardiomyopathy (HCC) 01/29/2017  . MR (mitral regurgitation) 01/29/2017  . HTN (hypertension) 01/29/2017  . Hyperlipidemia 01/29/2017   . Gout 01/29/2017  . Arthritis 01/29/2017  . Anemia 01/29/2017    Past Surgical History:  Procedure Laterality Date  . CHOLECYSTECTOMY N/A 08/17/2018   Procedure: LAPAROSCOPIC CHOLECYSTECTOMY;  Surgeon: [name], MD;  Location: [facility];  Service: General;  Laterality: N/A;  . CORONARY BALLOON ANGIOPLASTY N/A 03/03/2017   Procedure: CORONARY BALLOON ANGIOPLASTY;  Surgeon: Marykay Lex, MD;  Location: [facility] INVASIVE CV LAB;  Service: Cardiovascular;  Laterality: N/A;  . CORONARY STENT INTERVENTION N/A 03/03/2017   Procedure: CORONARY STENT INTERVENTION;  Surgeon: Marykay Lex, MD;  Location: [facility] INVASIVE CV LAB;  Service: Cardiovascular;  Laterality: N/A;  . LEFT HEART CATH AND CORONARY ANGIOGRAPHY N/A 03/02/2017   Procedure: LEFT HEART CATH AND CORONARY ANGIOGRAPHY;  Surgeon: Tonny Bollman, MD;  Location: [facility] INVASIVE CV LAB;  Service: Cardiovascular;  Laterality: N/A;     OB History    Gravida  7   Para      Term      Preterm      AB      Living  7     SAB      TAB      Ectopic      Multiple      Live Births              Family History  Problem Relation Age of Onset  . Hypertension Mother   . Liver disease Mother   . Alcohol abuse Father     Social History   Tobacco Use  .  Smoking status: Never Smoker  . Smokeless tobacco: Never Used  Vaping Use  . Vaping Use: Never used  Substance Use Topics  . Alcohol use: No  . Drug use: No    Home Medications Prior to Admission medications   Medication Sig Start Date End Date Taking? Authorizing Provider  acetaminophen (TYLENOL) 325 MG tablet Take 2 tablets (650 mg total) by mouth every 6 (six) hours as needed for mild pain, fever or headache (or Fever >/= 101). 08/07/19   Shon Hale, MD  alendronate (FOSAMAX) 70 MG tablet Take 70 mg by mouth once a week. 03/15/19   [provider]  ALLERGY RELIEF 10 MG tablet Take 10 mg by mouth daily. 07/26/19   [provider]  allopurinol  (ZYLOPRIM) 300 MG tablet Take 300 mg by mouth daily.     [provider]  aspirin EC 81 MG tablet Take 1 tablet (81 mg total) by mouth daily with breakfast. 08/07/19   Mariea Clonts, Courage, MD  atorvastatin (LIPITOR) 10 MG tablet Take 10 mg by mouth daily.    [provider]  calcium gluconate 500 MG tablet Take 1 tablet by mouth 2 (two) times daily.    [provider]  Cholecalciferol (VITAMIN D3) 25 MCG (1000 UT) CAPS Take 1 capsule by mouth daily.     [provider]  ezetimibe (ZETIA) 10 MG tablet Take 10 mg by mouth daily.    [provider]  famotidine (PEPCID) 20 MG tablet Take 1 tablet (20 mg total) by mouth 2 (two) times daily. 01/24/18   Gerhard Munch, MD  ferrous sulfate (FEROSUL) 325 (65 FE) MG tablet Take 325 mg by mouth daily with breakfast.    [provider]  folic acid (FOLVITE) 1 MG tablet Take 1 mg by mouth daily. 07/26/19   [provider]  methotrexate 2.5 MG tablet Take 20 mg by mouth every Wednesday.     [provider]  metoprolol succinate (TOPROL-XL) 50 MG 24 hr tablet TAKE 1 TABLET BY MOUTH ONCE A DAY FOR HEART AND BLOOD PRESSURE. 11/19/19   Iran Ouch, Lennart Pall, PA-C  nitroGLYCERIN (NITROSTAT) 0.4 MG SL tablet Place 1 tablet (0.4 mg total) under the tongue every 5 (five) minutes as needed for chest pain. 08/19/19   [place] Ouch, Lennart Pall, PA-C  pantoprazole (PROTONIX) 40 MG tablet Take 1 tablet (40 mg total) by mouth daily. 08/07/19   Shon Hale, MD  prednisoLONE acetate (PRED FORTE) 1 % ophthalmic suspension Place 1 drop into the right eye every 2 (two) hours. 05/06/19   [provider]  RESTASIS 0.05 % ophthalmic emulsion Place 1 drop into both eyes 2 (two) times daily. 05/06/19   [provider]  sucralfate (CARAFATE) 1 g tablet Take 1 g by mouth 2 (two) times daily. 08/01/19   [provider]  verapamil (CALAN-SR) 240 MG CR tablet Take 1 tablet (240 mg total) by mouth daily.  08/19/19   Ellsworth Lennox, PA-C    Allergies    Patient has no known allergies.  Review of Systems   Review of Systems  Respiratory: Positive for shortness of breath.   All other systems reviewed and are negative.   Physical Exam Updated Vital Signs Ht 5\' 2"  (1.575 m)   Wt 84.4 kg   BMI 34.02 kg/m   Physical Exam Vitals and nursing note reviewed.   79 year old female, resting comfortably and in no acute distress. Vital signs are significant for elevated blood pressure. Oxygen saturation  is 96%, which is normal. Head is normocephalic and atraumatic. PERRLA, EOMI. Oropharynx is clear. Neck is nontender and supple without adenopathy or JVD. Back is nontender and there is no CVA tenderness.  There is 1-2+ presacral edema. Lungs have diffuse rales and coarse expiratory wheezes without rhonchi. Chest is nontender. Heart has regular rate and rhythm without murmur. Abdomen is soft, flat, nontender without masses or hepatosplenomegaly and peristalsis is normoactive. Extremities have 2+ pretibial edema, full range of motion is present. Skin is warm and dry without rash. Neurologic: Mental status is normal, cranial nerves are intact, there are no motor or sensory deficits.  ED Results / Procedures / Treatments   Labs (all labs ordered are listed, but only abnormal results are displayed) Labs Reviewed  BRAIN NATRIURETIC PEPTIDE - Abnormal; Notable for the following components:      Result Value   B Natriuretic Peptide 201.0 (*)    All other components within normal limits  BASIC METABOLIC PANEL - Abnormal; Notable for the following components:   Calcium 8.7 (*)    All other components within normal limits  TROPONIN I (HIGH SENSITIVITY) - Abnormal; Notable for the following components:   Troponin I (High Sensitivity) 107 (*)    All other components within normal limits  TROPONIN I (HIGH SENSITIVITY) - Abnormal; Notable for the following components:   Troponin I (High  Sensitivity) 110 (*)    All other components within normal limits  RESPIRATORY PANEL BY RT PCR (FLU A&B, COVID)  CBC WITH DIFFERENTIAL/PLATELET    EKG EKG Interpretation  Date/Time:  Friday April 10 2020 04:35:43 EDT Ventricular Rate:  68 PR Interval:    QRS Duration: 98 QT Interval:  453 QTC Calculation: 482 R Axis:   15 Text Interpretation: Sinus or ectopic atrial rhythm Prolonged PR interval LVH with secondary repolarization abnormality Anterior Q waves, possibly due to LVH When compared with ECG of 08/06/2019, No significant change was found Confirmed by Dione Booze ([id number]) on 04/10/2020 4:42:26 AM   Radiology DG Chest Port 1 View  Result Date: 04/10/2020 CLINICAL DATA:  Shortness of breath. EXAM: PORTABLE CHEST 1 VIEW COMPARISON:  One-view chest x-ray 08/05/2018 FINDINGS: Heart is enlarged. Lung volumes are low. Atherosclerotic changes are present at the aortic arch. Interstitial coarsening is present. Asymmetric lower lobe airspace opacities are present, left greater than right. IMPRESSION: 1. Cardiomegaly without failure. 2. Asymmetric bilateral lower lobe airspace disease, left greater than right. While this likely reflects atelectasis, infection is not excluded. Electronically Signed   By: Marin Roberts M.D.   On: 04/10/2020 05:23    Procedures Procedures   Medications Ordered in ED Medications  furosemide (LASIX) injection 40 mg (40 mg Intravenous Given 04/10/20 0600)    ED Course  I have reviewed the triage vital signs and the nursing notes.  Pertinent labs & imaging results that were available during my care of the patient were reviewed by me and considered in my medical decision making (see chart for details).  MDM Rules/Calculators/A&P Dyspnea concerning for heart failure exacerbation based on rales and edema.  Consider pneumonia, pulmonary embolism.  Old records are reviewed confirming history of diastolic heart failure and hypertrophic obstructive  cardiomyopathy.  Will check chest x-ray and screening labs.  ECG shows changes of LVH with secondary repolarization changes, not significantly changed from prior.  Radiologist feels that x-ray shows no acute process.  I have reviewed the images and feel that it does show some interstitial edema consistent with heart failure.  Troponin  is mildly elevated, but less than had been previously.  Delta troponin shows no significant change.  This is not felt to represent ACS.  BNP is mildly elevated, consistent with CHF exacerbation.  She was given a dose of furosemide and had reasonably good diuresis.  Following this, she was feeling much better.  She was taken off oxygen and was maintaining excellent oxygen saturations on room air.  She was anxious to go home and was felt to be safe to be discharged at this point.  She is discharged with prescription for furosemide, instructed to follow-up with PCP in the next week.  Return precautions discussed.  Final Clinical Impression(s) / ED Diagnoses Final diagnoses:  Acute on chronic diastolic heart failure (HCC)  Elevated troponin    Rx / DC Orders ED Discharge Orders         Ordered    furosemide (LASIX) 40 MG tablet  Daily        04/10/20 0713           Dione Booze, MD 04/10/20 [phone]

## 2020-04-10 NOTE — ED Notes (Signed)
Date and time results received: 04/10/20 0545 (use smartphrase ".now" to insert current time)  Test: Troponin Critical Value: 107  Name of Provider Notified: Dr Preston Fleeting  Orders Received? Or Actions Taken?:

## 2020-04-29 ENCOUNTER — Ambulatory Visit: Payer: Medicare Other | Admitting: Cardiology

## 2020-06-10 NOTE — Progress Notes (Signed)
Cardiology Office Note  Date: 06/11/2020   ID: Dana [name], Round 22-Dec-1940, MRN [id number]  PCP:  Dana Grippe, MD  Cardiologist:  Dana Dell, MD Electrophysiologist:  None   Chief Complaint  Patient presents with   Cardiac follow-up    History of Present Illness: Dana [name] is a 79 y.o. female last assessed via telehealth encounter in May.  She is here with her son for a follow-up visit.  We went over her medications which are outlined below.  She has been taking Lasix more so on an intermittent basis than regularly.  She was seen in the ER back in October with shortness of breath.  She was felt to have acute on chronic diastolic heart failure and treated with IV Lasix.  Today's weight is still about 4 pounds over her prior baseline.  We discussed using Lasix at least every other day and guiding therapy by her scale at home.  She does not describe any angina at this time.  No palpitations or syncope.  Past Medical History:  Diagnosis Date   CAD (coronary artery disease)    a. s/p DES to OM1 in 02/2017   Essential hypertension    Gout    HOCM (hypertrophic obstructive cardiomyopathy) (HCC)    Hyperlipidemia    Rheumatoid arthritis (HCC)     Past Surgical History:  Procedure Laterality Date   CHOLECYSTECTOMY N/A 08/17/2018   Procedure: LAPAROSCOPIC CHOLECYSTECTOMY;  Surgeon: Dana Macho, MD;  Location: [facility];  Service: General;  Laterality: N/A;   CORONARY BALLOON ANGIOPLASTY N/A 03/03/2017   Procedure: CORONARY BALLOON ANGIOPLASTY;  Surgeon: Dana Lex, MD;  Location: [facility] INVASIVE CV LAB;  Service: Cardiovascular;  Laterality: N/A;   CORONARY STENT INTERVENTION N/A 03/03/2017   Procedure: CORONARY STENT INTERVENTION;  Surgeon: Dana Lex, MD;  Location: [facility] INVASIVE CV LAB;  Service: Cardiovascular;  Laterality: N/A;   LEFT HEART CATH AND CORONARY ANGIOGRAPHY N/A 03/02/2017   Procedure: LEFT HEART CATH AND CORONARY ANGIOGRAPHY;  Surgeon: Dana Bollman, MD;  Location: [facility] INVASIVE CV LAB;  Service: Cardiovascular;  Laterality: N/A;    Current Outpatient Medications  Medication Sig Dispense Refill   acetaminophen (TYLENOL) 325 MG tablet Take 2 tablets (650 mg total) by mouth every 6 (six) hours as needed for mild pain, fever or headache (or Fever >/= 101). 12 tablet 0   alendronate (FOSAMAX) 70 MG tablet Take 70 mg by mouth once a week.     ALLERGY RELIEF 10 MG tablet Take 10 mg by mouth daily.     allopurinol (ZYLOPRIM) 300 MG tablet Take 300 mg by mouth daily.     aspirin EC 81 MG tablet Take 1 tablet (81 mg total) by mouth daily with breakfast. 30 tablet 4   atorvastatin (LIPITOR) 10 MG tablet Take 10 mg by mouth daily.     calcium gluconate 500 MG tablet Take 1 tablet by mouth 2 (two) times daily.     Cholecalciferol (VITAMIN D3) 25 MCG (1000 UT) CAPS Take 1 capsule by mouth daily.      ezetimibe (ZETIA) 10 MG tablet Take 10 mg by mouth daily.     famotidine (PEPCID) 20 MG tablet Take 1 tablet (20 mg total) by mouth 2 (two) times daily. 30 tablet 0   ferrous sulfate 325 (65 FE) MG tablet Take 325 mg by mouth daily with breakfast.     folic acid (FOLVITE) 1 MG tablet Take 1 mg by mouth daily.     furosemide (  LASIX) 40 MG tablet Take 1 tablet (40 mg total) by mouth daily. 30 tablet 0   methotrexate 2.5 MG tablet Take 20 mg by mouth every Wednesday.      metoprolol succinate (TOPROL-XL) 50 MG 24 hr tablet TAKE 1 TABLET BY MOUTH ONCE A DAY FOR HEART AND BLOOD PRESSURE. 30 tablet 6   nitroGLYCERIN (NITROSTAT) 0.4 MG SL tablet Place 1 tablet (0.4 mg total) under the tongue every 5 (five) minutes as needed for chest pain. 25 tablet 2   pantoprazole (PROTONIX) 40 MG tablet Take 1 tablet (40 mg total) by mouth daily. 30 tablet 4   prednisoLONE acetate (PRED FORTE) 1 % ophthalmic suspension Place 1 drop into the right eye every 2 (two) hours.     RESTASIS 0.05 % ophthalmic emulsion Place 1 drop into both eyes 2 (two) times  daily.     sucralfate (CARAFATE) 1 g tablet Take 1 g by mouth 2 (two) times daily.     verapamil (CALAN-SR) 240 MG CR tablet Take 1 tablet (240 mg total) by mouth daily. 30 tablet 11   No current facility-administered medications for this visit.   Allergies:  Patient has no known allergies.   ROS: No syncope.  Physical Exam: VS:  BP 132/76    Pulse 73    Ht 5\' 2"  (1.575 m)    Wt 184 lb 6.4 oz (83.6 kg)    SpO2 94%    BMI 33.73 kg/m , BMI Body mass index is 33.73 kg/m.  Wt Readings from Last 3 Encounters:  06/11/20 184 lb 6.4 oz (83.6 kg)  04/10/20 186 lb (84.4 kg)  11/20/19 181 lb (82.1 kg)    General: Patient appears comfortable at rest. HEENT: Conjunctiva and lids normal, wearing a mask. Neck: Supple, no elevated JVP or carotid bruits, no thyromegaly. Lungs: Clear to auscultation, nonlabored breathing at rest. Cardiac: Regular rate and rhythm, no S3, 3/6 harsh systolic murmur, no pericardial rub. Abdomen: Soft, nontender, bowel sounds present. Extremities: No pitting edema.  ECG:  An ECG dated 04/10/2020 was personally reviewed today and demonstrated:  Sinus rhythm with prolonged PR interval, LVH with repolarization abnormalities.  Recent Labwork: 08/07/2019: ALT 13; AST 14 04/10/2020: B Natriuretic Peptide 201.0; BUN 20; Creatinine, Ser 0.77; Hemoglobin 12.8; Platelets 232; Potassium 4.1; Sodium 138     Component Value Date/Time   CHOL 123 09/17/2017 0627   TRIG 69 09/17/2017 0627   HDL 49 09/17/2017 0627   CHOLHDL 2.5 09/17/2017 0627   VLDL 14 09/17/2017 0627   LDLCALC 60 09/17/2017 0627    Other Studies Reviewed Today:  Echocardiogram 08/07/2019: 1. There is severe basal septal hypertophy. Combined with a hyperdynamic  LV this creates SAM of the anterior mitral valve leaflet and a dynamic  subvalvular gradient. Peak gradient with Valsalva.. Left  ventricular ejection fraction, by  estimation, is 65 to 70%. The left ventricle has hyperdynamic function.   The left ventrical has no regional wall motion abnormalities. There is  Severe basal septal hypertophy left ventricular hypertrophy. Left  ventricular diastolic parameters are  consistent with Grade II diastolic dysfunction (pseudonormalization).  Elevated left atrial pressure.  2. Right ventricular systolic function is normal. The right ventricular  size is normal. There is mildly elevated pulmonary artery systolic  pressure.  3. Left atrial size was severely dilated. No left atrial/left atrial  appendage thrombus was detected.  4. Right atrial size was mildly dilated.  5. The mitral valve is normal in structure and function. Mild  mitral  valve regurgitation. No evidence of mitral stenosis.  6. The aortic valve has an indeterminant number of cusps. Aortic valve  regurgitation is not visualized. Difficult to assess aortic valve given  subvalvular gradient. Moprhologically looks to be mild aortic stenosis.  7. The inferior vena cava is normal in size with greater than 50%  respiratory variability, suggesting right atrial pressure of 3 mmHg.   Chest x-ray 04/10/2020: FINDINGS: Heart is enlarged. Lung volumes are low. Atherosclerotic changes are present at the aortic arch. Interstitial coarsening is present. Asymmetric lower lobe airspace opacities are present, left greater than right.  IMPRESSION: 1. Cardiomegaly without failure. 2. Asymmetric bilateral lower lobe airspace disease, left greater than right. While this likely reflects atelectasis, infection is not excluded.  Assessment and Plan:  1.  Hypertrophic obstructive cardiomyopathy.  Echocardiogram from February is noted above.  Heart rate and blood pressure are reasonably well controlled today on combination of Toprol-XL and Calan SR.  She is also on Lasix 40 mg, will aim for every other day with dose adjustments based on weight change.  2.  CAD status post DES to first obtuse marginal in September 2018.  She does  not report any active angina at this time.  Continue aspirin and statin.  Medication Adjustments/Labs and Tests Ordered: Current medicines are reviewed at length with the patient today.  Concerns regarding medicines are outlined above.   Tests Ordered: No orders of the defined types were placed in this encounter.   Medication Changes: No orders of the defined types were placed in this encounter.   Disposition:  Follow up 6 months in the [place] office.  Signed, Jonelle Sidle, MD, [facility] 06/11/2020 9:00 AM    [facility] at [facility] [address] [place], [place] [address] Phone: [phone]; Fax: [phone]

## 2020-06-11 ENCOUNTER — Encounter: Payer: Self-pay | Admitting: Cardiology

## 2020-06-11 ENCOUNTER — Ambulatory Visit (INDEPENDENT_AMBULATORY_CARE_PROVIDER_SITE_OTHER): Payer: Medicare Other | Admitting: Cardiology

## 2020-06-11 ENCOUNTER — Other Ambulatory Visit: Payer: Self-pay

## 2020-06-11 VITALS — BP 132/76 | HR 73 | Ht 62.0 in | Wt 184.4 lb

## 2020-06-11 DIAGNOSIS — I421 Obstructive hypertrophic cardiomyopathy: Secondary | ICD-10-CM | POA: Diagnosis not present

## 2020-06-11 DIAGNOSIS — I25119 Atherosclerotic heart disease of native coronary artery with unspecified angina pectoris: Secondary | ICD-10-CM | POA: Diagnosis not present

## 2020-06-11 NOTE — Patient Instructions (Signed)
Medication Instructions:  Your physician recommends that you continue on your current medications as directed. Please refer to the Current Medication list given to you today.  *If you need a refill on your cardiac medications before your next appointment, please call your pharmacy*   Lab Work: None today If you have labs (blood work) drawn today and your tests are completely normal, you will receive your results only by: [name] MyChart Message (if you have MyChart) OR . A paper copy in the mail If you have any lab test that is abnormal or we need to change your treatment, we will call you to review the results.   Testing/Procedures: None today   Follow-Up: At [facility], you and your health needs are our priority.  As part of our continuing mission to provide you with exceptional heart care, we have created designated Provider Care Teams.  These Care Teams include your primary Cardiologist (physician) and Advanced Practice Providers (APPs -  Physician Assistants and Nurse Practitioners) who all work together to provide you with the care you need, when you need it.  We recommend signing up for the patient portal called "MyChart".  Sign up information is provided on this After Visit Summary.  MyChart is used to connect with patients for Virtual Visits (Telemedicine).  Patients are able to view lab/test results, encounter notes, upcoming appointments, etc.  Non-urgent messages can be sent to your provider as well.   To learn more about what you can do with MyChart, go to [email or web address].    Your next appointment:   6 month(s)  The format for your next appointment:   In Person  Provider:      Other Instructions None today      Thank you for choosing [facility] !

## 2020-08-20 ENCOUNTER — Other Ambulatory Visit: Payer: Self-pay | Admitting: Student

## 2020-09-27 ENCOUNTER — Encounter (HOSPITAL_COMMUNITY): Payer: Self-pay

## 2020-09-27 ENCOUNTER — Emergency Department (HOSPITAL_COMMUNITY)
Admission: EM | Admit: 2020-09-27 | Discharge: 2020-09-27 | Disposition: A | Discharge status: Home / Self Care | Payer: Medicare Other | Attending: Emergency Medicine | Admitting: Emergency Medicine

## 2020-09-27 ENCOUNTER — Other Ambulatory Visit: Payer: Self-pay

## 2020-09-27 ENCOUNTER — Emergency Department (HOSPITAL_COMMUNITY): Payer: Medicare Other

## 2020-09-27 DIAGNOSIS — Z7982 Long term (current) use of aspirin: Secondary | ICD-10-CM | POA: Diagnosis not present

## 2020-09-27 DIAGNOSIS — R0602 Shortness of breath: Secondary | ICD-10-CM | POA: Diagnosis present

## 2020-09-27 DIAGNOSIS — I110 Hypertensive heart disease with heart failure: Secondary | ICD-10-CM | POA: Insufficient documentation

## 2020-09-27 DIAGNOSIS — Z79899 Other long term (current) drug therapy: Secondary | ICD-10-CM | POA: Insufficient documentation

## 2020-09-27 DIAGNOSIS — I2510 Atherosclerotic heart disease of native coronary artery without angina pectoris: Secondary | ICD-10-CM | POA: Diagnosis not present

## 2020-09-27 DIAGNOSIS — I5033 Acute on chronic diastolic (congestive) heart failure: Secondary | ICD-10-CM | POA: Diagnosis not present

## 2020-09-27 DIAGNOSIS — I509 Heart failure, unspecified: Secondary | ICD-10-CM

## 2020-09-27 LAB — COMPREHENSIVE METABOLIC PANEL
ALT: 35 U/L (ref 0–44)
AST: 38 U/L (ref 15–41)
Albumin: 3.7 g/dL (ref 3.5–5.0)
Alkaline Phosphatase: 52 U/L (ref 38–126)
Anion gap: 10 (ref 5–15)
BUN: 22 mg/dL (ref 8–23)
CO2: 24 mmol/L (ref 22–32)
Calcium: 8.7 mg/dL — ABNORMAL LOW (ref 8.9–10.3)
Chloride: 107 mmol/L (ref 98–111)
Creatinine, Ser: 0.83 mg/dL (ref 0.44–1.00)
GFR, Estimated: >60 mL/min (ref >60)
Glucose, Bld: 137 mg/dL — ABNORMAL HIGH (ref 70–99)
Potassium: 3.9 mmol/L (ref 3.5–5.1)
Sodium: 141 mmol/L (ref 135–145)
Total Bilirubin: 0.8 mg/dL (ref 0.3–1.2)
Total Protein: 6.7 g/dL (ref 6.5–8.1)

## 2020-09-27 LAB — CBC WITH DIFFERENTIAL/PLATELET
Abs Immature Granulocytes: 0.02 10*3/uL (ref 0.00–0.07)
Basophils Absolute: 0 10*3/uL (ref 0.0–0.1)
Basophils Relative: 1 %
Eosinophils Absolute: 0.1 10*3/uL (ref 0.0–0.5)
Eosinophils Relative: 1 %
HCT: 37.8 % (ref 36.0–46.0)
Hemoglobin: 12.4 g/dL (ref 12.0–15.0)
Immature Granulocytes: 1 %
Lymphocytes Relative: 10 %
Lymphs Abs: 0.4 10*3/uL — ABNORMAL LOW (ref 0.7–4.0)
MCH: 32.8 pg (ref 26.0–34.0)
MCHC: 32.8 g/dL (ref 30.0–36.0)
MCV: 100 fL (ref 80.0–100.0)
Monocytes Absolute: 0.2 10*3/uL (ref 0.1–1.0)
Monocytes Relative: 4 %
Neutro Abs: 3.7 10*3/uL (ref 1.7–7.7)
Neutrophils Relative %: 83 %
Platelets: 221 10*3/uL (ref 150–400)
RBC: 3.78 MIL/uL — ABNORMAL LOW (ref 3.87–5.11)
RDW: 15.2 % (ref 11.5–15.5)
WBC: 4.4 10*3/uL (ref 4.0–10.5)
nRBC: 0 % (ref 0.0–0.2)

## 2020-09-27 LAB — TROPONIN I (HIGH SENSITIVITY): Troponin I (High Sensitivity): 92 ng/L — ABNORMAL HIGH (ref <18)

## 2020-09-27 LAB — BRAIN NATRIURETIC PEPTIDE: B Natriuretic Peptide: 397 pg/mL — ABNORMAL HIGH (ref 0.0–100.0)

## 2020-09-27 NOTE — ED Notes (Signed)
DC instructions reviewed with pt including making a  f/u appointment with cardiologist. Pt and family express understanding. Son present to take her home. Pt states she feels much better.

## 2020-09-27 NOTE — ED Provider Notes (Signed)
[facility] EMERGENCY DEPARTMENT Provider Note   CSN: [id number] Arrival date & time: 09/27/20  4132     History Chief Complaint  Patient presents with  . Shortness of Breath    Dana [name] is a 80 y.o. female.  HPI   This patient is a 80 year old female, she is from [place], she has lived here for approximately 4 or 5 years.  She presents with her son who is doing the translating.  The patient has a history of hypertrophic obstructive cardiomyopathy, she has rheumatoid arthritis, she has essential hypertension and she does have 1 stent in her heart that was placed in the obtuse marginal in 2018.  The patient reports that when she went to bed last night she was having a little discomfort in her left upper chest and left upper back, that is now gone but she woke up this morning feeling short of breath.  There is no cough, no fever, she does not feel particularly swollen.  The shortness of breath was worrisome to the son who brought her to the hospital this morning instead of taking her to church which is where they were going to go.  The patient reports that she was able to lay flat last night, she was a little short of breath, she did not have to sit up or lay on more pillows.  Review of the medical record shows that the last echocardiogram that was performed was February 2021, this showed that she did have grade 2 diastolic dysfunction with congestive heart failure but a normal ejection fraction of 65 to 70%.  It showed that she had severe basal septal hypertrophy, normal right ventricular function, left atrium was severely dilated, right atrium was mildly dilated, it was difficult to assess the aortic valve.  She was last seen in the cardiology office on June 11, 2020, it was noted that this echocardiogram had recently been performed, she was not having any exertional or chest pain symptoms and no shortness of breath.  Past Medical History:  Diagnosis Date  . CAD (coronary  artery disease)    a. s/p DES to OM1 in 02/2017  . Essential hypertension   . Gout   . HOCM (hypertrophic obstructive cardiomyopathy) (HCC)   . Hyperlipidemia   . Rheumatoid arthritis [facility])     Patient Active Problem List   Diagnosis Date Noted  . Gallstone pancreatitis   . RUQ pain   . Acute on chronic diastolic CHF (congestive heart failure) (HCC) 01/26/2018  . Rheumatoid arthritis (HCC) 01/26/2018  . Elevated troponin 01/26/2018  . Acute lower UTI 01/26/2018  . Abdominal pain 01/26/2018  . GERD (gastroesophageal reflux disease) 01/26/2018  . Precordial chest pain   . CAD (coronary artery disease) 09/17/2017  . HOCM (hypertrophic obstructive cardiomyopathy) (HCC)   . Unstable angina (HCC)   . Coronary artery disease involving native coronary artery of native heart with unstable angina pectoris (HCC)   . Chest pain 01/29/2017  . Hypertrophic cardiomyopathy (HCC) 01/29/2017  . MR (mitral regurgitation) 01/29/2017  . HTN (hypertension) 01/29/2017  . Hyperlipidemia 01/29/2017  . Gout 01/29/2017  . Arthritis 01/29/2017  . Anemia 01/29/2017    Past Surgical History:  Procedure Laterality Date  . CHOLECYSTECTOMY N/A 08/17/2018   Procedure: LAPAROSCOPIC CHOLECYSTECTOMY;  Surgeon: [name], MD;  Location: [facility] OR[facility];  Service: General;  Laterality: N/A;  . CORONARY BALLOON ANGIOPLASTY N/A 03/03/2017   Procedure: CORONARY BALLOON ANGIOPLASTY;  Surgeon: Marykay Lex, MD;  Location: [facility] INVASIVE CV LAB;  Service: Cardiovascular;  Laterality: N/A;  . CORONARY STENT INTERVENTION N/A 03/03/2017   Procedure: CORONARY STENT INTERVENTION;  Surgeon: Marykay Lex, MD;  Location: [facility] INVASIVE CV LAB;  Service: Cardiovascular;  Laterality: N/A;  . LEFT HEART CATH AND CORONARY ANGIOGRAPHY N/A 03/02/2017   Procedure: LEFT HEART CATH AND CORONARY ANGIOGRAPHY;  Surgeon: Tonny Bollman, MD;  Location: [facility] INVASIVE CV LAB;  Service: Cardiovascular;  Laterality: N/A;     OB History    Gravida  7    Para      Term      Preterm      AB      Living  7     SAB      IAB      Ectopic      Multiple      Live Births              Family History  Problem Relation Age of Onset  . Hypertension Mother   . Liver disease Mother   . Alcohol abuse Father     Social History   Tobacco Use  . Smoking status: Never Smoker  . Smokeless tobacco: Never Used  Vaping Use  . Vaping Use: Never used  Substance Use Topics  . Alcohol use: No  . Drug use: No    Home Medications Prior to Admission medications   Medication Sig Start Date End Date Taking? Authorizing Provider  alendronate (FOSAMAX) 70 MG tablet Take 70 mg by mouth once a week. 03/15/19  Yes [provider]  ALLERGY RELIEF 10 MG tablet Take 10 mg by mouth daily. 07/26/19  Yes [provider]  allopurinol (ZYLOPRIM) 300 MG tablet Take 300 mg by mouth daily.   Yes [provider]  aspirin EC 81 MG tablet Take 1 tablet (81 mg total) by mouth daily with breakfast. 08/07/19  Yes Emokpae, Courage, MD  atorvastatin (LIPITOR) 10 MG tablet Take 10 mg by mouth daily.   Yes [provider]  Cholecalciferol (VITAMIN D3) 25 MCG (1000 UT) CAPS Take 1 capsule by mouth daily.    Yes [provider]  ezetimibe (ZETIA) 10 MG tablet Take 10 mg by mouth daily.   Yes [provider]  famotidine (PEPCID) 20 MG tablet Take 1 tablet (20 mg total) by mouth 2 (two) times daily. 01/24/18  Yes Gerhard Munch, MD  ferrous sulfate 325 (65 FE) MG tablet Take 325 mg by mouth daily with breakfast.   Yes [provider]  folic acid (FOLVITE) 1 MG tablet Take 1 mg by mouth daily. 07/26/19  Yes [provider]  furosemide (LASIX) 40 MG tablet Take 1 tablet (40 mg total) by mouth daily. 04/10/20  Yes Dione Booze, MD  methotrexate 2.5 MG tablet Take 20 mg by mouth every Wednesday.    Yes [provider]  metoprolol succinate (TOPROL-XL) 50 MG 24 hr tablet TAKE 1 TABLET BY  MOUTH ONCE A DAY FOR HEART AND BLOOD PRESSURE. 11/19/19  Yes Strader, [place] M, PA-C  pantoprazole (PROTONIX) 40 MG tablet Take 1 tablet (40 mg total) by mouth daily. 08/07/19  Yes Emokpae, Courage, MD  RESTASIS 0.05 % ophthalmic emulsion Place 1 drop into both eyes 2 (two) times daily. 05/06/19  Yes [provider]  sucralfate (CARAFATE) 1 g tablet Take 1 g by mouth 2 (two) times daily. 08/01/19  Yes [provider]  verapamil (CALAN-SR) 240 MG CR tablet Take 1 tablet (240 mg total) by mouth daily. 08/19/19  Yes Iran Ouch, [place] M, PA-C  acetaminophen (TYLENOL) 325 MG tablet Take 2 tablets (650 mg total) by mouth every 6 (six) hours as needed for mild pain, fever or headache (or Fever >/= 101). 08/07/19   Shon Hale, MD  calcium gluconate 500 MG tablet Take 1 tablet by mouth 2 (two) times daily.    [provider]  nitroGLYCERIN (NITROSTAT) 0.4 MG SL tablet PLACE 1 TABLET UNDER THE TONGUE EVERY 5 MINUTES AS NEEDED FOR CHEST PAIN 08/20/20   Jonelle Sidle, MD    Allergies    Patient has no known allergies.  Review of Systems   Review of Systems  All other systems reviewed and are negative.   Physical Exam Updated Vital Signs BP 132/73   Pulse 64   Temp 98 F (36.7 C) (Oral)   Resp (!) 21   Ht 1.575 m (5\' 2" )   Wt 83.9 kg   SpO2 95%   BMI 33.84 kg/m   Physical Exam Vitals and nursing note reviewed.  Constitutional:      General: She is not in acute distress.    Appearance: She is well-developed.  HENT:     Head: Normocephalic and atraumatic.     Mouth/Throat:     Pharynx: No oropharyngeal exudate.  Eyes:     General: No scleral icterus.       Right eye: No discharge.        Left eye: No discharge.     Conjunctiva/sclera: Conjunctivae normal.     Pupils: Pupils are equal, round, and reactive to light.  Neck:     Thyroid: No thyromegaly.     Vascular: No JVD.  Cardiovascular:     Rate and Rhythm: Normal rate and regular rhythm.     Heart  sounds: Murmur heard.  No friction rub. No gallop.      Comments: Systolic murmur noted Pulmonary:     Effort: Pulmonary effort is normal. No respiratory distress.     Breath sounds: Rales present. No wheezing.     Comments: There does appear to be some rales in the left upper lobe auscultated on the posterior and the anterior aspect of the chest, all other lung fields are clear Abdominal:     General: Bowel sounds are normal. There is no distension.     Palpations: Abdomen is soft. There is no mass.     Tenderness: There is no abdominal tenderness.  Musculoskeletal:        General: No tenderness. Normal range of motion.     Cervical back: Normal range of motion and neck supple.  Lymphadenopathy:     Cervical: No cervical adenopathy.  Skin:    General: Skin is warm and dry.     Findings: No erythema or rash.  Neurological:     Mental Status: She is alert.     Coordination: Coordination normal.  Psychiatric:        Behavior: Behavior normal.     ED Results / Procedures / Treatments   Labs (all labs ordered are listed, but only abnormal results are displayed) Labs Reviewed  CBC WITH DIFFERENTIAL/PLATELET - Abnormal; Notable for the following components:      Result Value   RBC 3.78 (*)    Lymphs Abs 0.4 (*)    All other components within normal limits  COMPREHENSIVE METABOLIC PANEL - Abnormal; Notable for the following components:   Glucose, Bld 137 (*)    Calcium 8.7 (*)    All other components  within normal limits  BRAIN NATRIURETIC PEPTIDE - Abnormal; Notable for the following components:   B Natriuretic Peptide 397.0 (*)    All other components within normal limits  TROPONIN I (HIGH SENSITIVITY) - Abnormal; Notable for the following components:   Troponin I (High Sensitivity) 92 (*)    All other components within normal limits    EKG EKG Interpretation  Date/Time:  Sunday September 27 2020 09:56:49 EDT Ventricular Rate:  72 PR Interval:  216 QRS Duration: 105 QT  Interval:  452 QTC Calculation: 495 R Axis:   5 Text Interpretation: Sinus rhythm Borderline prolonged PR interval Abnormal R-wave progression, early transition LVH with secondary repolarization abnormality Anterior Q waves, possibly due to LVH Borderline ST elevation, inferior leads multiple old EKG's evaluated - similar pattern of LVH with repol abnormlaities Confirmed by Eber Hong ([id number]) on 09/27/2020 10:14:20 AM   Radiology DG Chest Port 1 View  Result Date: 09/27/2020 CLINICAL DATA:  Short of breath EXAM: PORTABLE CHEST 1 VIEW COMPARISON:  None. FINDINGS: Normal mediastinum and cardiac silhouette. Mild central bronchitic markings. RIGHT suprahilar vascular density again noted. No evidence of effusion, infiltrate, or pneumothorax. No acute bony abnormality. IMPRESSION: Mild bronchitic markings.  No acute findings. Electronically Signed   By: Genevive Bi M.D.   On: 09/27/2020 10:28    Procedures Procedures   Medications Ordered in ED Medications - No data to display  ED Course  I have reviewed the triage vital signs and the nursing notes.  Pertinent labs & imaging results that were available during my care of the patient were reviewed by me and considered in my medical decision making (see chart for details).  Clinical Course as of 09/27/20 1212  Sun Sep 27, 2020  1150 Per Dr. Anne Fu - trop is likely from HOCM, if no distress - feeling better - can go home - no diuresis as this may make it worse unless still feeling SOB - so as not to make the HOCM worse - routine f/u. [BM]    Clinical Course User Index [BM] Eber Hong, MD   MDM Rules/Calculators/A&P                          The patient's exam today is remarkable for some rales in the left upper lobe requiring a chest x-ray to make further evaluation.  Labs will also be ordered, she does not appear particularly tachypneic and is able to speak without distress, she had does not appear to be fluid overloaded, she does have  a history of grade 2 diastolic dysfunction thus will order some labs chest x-ray and an EKG.  The patient is agreeable  After the patient took her morning medication and after arrival she has diarrhea several times and feels much better, no more shortness of breath, she is stable for discharge  Final Clinical Impression(s) / ED Diagnoses Final diagnoses:  SOB (shortness of breath)  Chronic congestive heart failure, unspecified heart failure type [facility])    Rx / DC Orders ED Discharge Orders    None       Eber Hong, MD 09/27/20 1212

## 2020-09-27 NOTE — Discharge Instructions (Signed)
Please continue your home medications as prescribed by your doctor, please return to the emergency department for increasing shortness of breath fever or worsening symptoms

## 2020-09-27 NOTE — ED Notes (Signed)
Dr Hyacinth Meeker in to see pt for MSE prior to triage complete.

## 2020-09-27 NOTE — ED Triage Notes (Signed)
Pt presents to ED with son with complaints of SOB since this morning. Denies cough

## 2020-11-27 ENCOUNTER — Ambulatory Visit: Payer: Medicare Other | Admitting: Cardiology

## 2021-01-20 ENCOUNTER — Telehealth: Payer: Self-pay | Admitting: *Deleted

## 2021-01-20 ENCOUNTER — Telehealth (INDEPENDENT_AMBULATORY_CARE_PROVIDER_SITE_OTHER): Payer: Medicare Other | Admitting: Cardiology

## 2021-01-20 ENCOUNTER — Encounter: Payer: Self-pay | Admitting: Cardiology

## 2021-01-20 VITALS — BP 138/69 | Ht 62.0 in | Wt 179.0 lb

## 2021-01-20 DIAGNOSIS — I25119 Atherosclerotic heart disease of native coronary artery with unspecified angina pectoris: Secondary | ICD-10-CM | POA: Diagnosis not present

## 2021-01-20 DIAGNOSIS — I421 Obstructive hypertrophic cardiomyopathy: Secondary | ICD-10-CM | POA: Diagnosis not present

## 2021-01-20 NOTE — Patient Instructions (Addendum)

## 2021-01-20 NOTE — Telephone Encounter (Signed)
  Patient Consent for Virtual Visit        Dana [name] has provided verbal consent on 01/20/2021 for a virtual visit (video or telephone).   CONSENT FOR VIRTUAL VISIT FOR:  Dana [name]  By participating in this virtual visit I agree to the following:  I hereby voluntarily request, consent and authorize [facility] and its employed or contracted physicians, Producer, television/film/video, nurse practitioners or other licensed health care professionals (the Practitioner), to provide me with telemedicine health care services (the "Services") as deemed necessary by the treating Practitioner. I acknowledge and consent to receive the Services by the Practitioner via telemedicine. I understand that the telemedicine visit will involve communicating with the Practitioner through live audiovisual communication technology and the disclosure of certain medical information by electronic transmission. I acknowledge that I have been given the opportunity to request an in-person assessment or other available alternative prior to the telemedicine visit and am voluntarily participating in the telemedicine visit.  I understand that I have the right to withhold or withdraw my consent to the use of telemedicine in the course of my care at any time, without affecting my right to future care or treatment, and that the Practitioner or I may terminate the telemedicine visit at any time. I understand that I have the right to inspect all information obtained and/or recorded in the course of the telemedicine visit and may receive copies of available information for a reasonable fee.  I understand that some of the potential risks of receiving the Services via telemedicine include:  Delay or interruption in medical evaluation due to technological equipment failure or disruption; Information transmitted may not be sufficient (e.g. poor resolution of images) to allow for appropriate medical decision making by the Practitioner; and/or   In rare instances, security protocols could fail, causing a breach of personal health information.  Furthermore, I acknowledge that it is my responsibility to provide information about my medical history, conditions and care that is complete and accurate to the best of my ability. I acknowledge that Practitioner's advice, recommendations, and/or decision may be based on factors not within their control, such as incomplete or inaccurate data provided by me or distortions of diagnostic images or specimens that may result from electronic transmissions. I understand that the practice of medicine is not an exact science and that Practitioner makes no warranties or guarantees regarding treatment outcomes. I acknowledge that a copy of this consent can be made available to me via my patient portal [facility] MyChart), or I can request a printed copy by calling the office of [facility].    I understand that my insurance will be billed for this visit.   I have read or had this consent read to me. I understand the contents of this consent, which adequately explains the benefits and risks of the Services being provided via telemedicine.  I have been provided ample opportunity to ask questions regarding this consent and the Services and have had my questions answered to my satisfaction. I give my informed consent for the services to be provided through the use of telemedicine in my medical care

## 2021-01-20 NOTE — Progress Notes (Signed)
Virtual Visit via Telephone Note   This visit type was conducted due to national recommendations for restrictions regarding the COVID-19 Pandemic (e.g. social distancing) in an effort to limit this patient's exposure and mitigate transmission in our community.  Due to her co-morbid illnesses, this patient is at least at moderate risk for complications without adequate follow up.  This format is felt to be most appropriate for this patient at this time.  The patient did not have access to video technology/had technical difficulties with video requiring transitioning to audio format only (telephone).  All issues noted in this document were discussed and addressed.  No physical exam could be performed with this format.  Please refer to the patient's chart for her  consent to telehealth for [facility].    Date:  01/20/2021   ID:  Dana [name], DOB 04-20-1941, MRN [id number] The patient was identified using 2 identifiers.  Patient Location: Home Provider Location: Office/Clinic   PCP:  [name], MD   [facility] Providers Cardiologist:  Nona Dell, MD {   Evaluation Performed:  Follow-Up Visit  Chief Complaint:  Cardiac follow-up  History of Present Illness:    Dana [name] is a 80 y.o. female last seen in December 2021.  We spoke by phone today, I mainly communicated with her son who is her primary caregiver.  He tells me that she has not complained of any significant chest pain, no nitroglycerin use.  She has been more consistent with Lasix, 2 or 3 times a week and her weight has not increased, in fact is down about 5 pounds.  Reported blood pressure is well controlled.  I reviewed the remainder of her medications which are outlined below.  Past Medical History:  Diagnosis Date   CAD (coronary artery disease)    a. s/p DES to OM1 in 02/2017   Essential hypertension    Gout    HOCM (hypertrophic obstructive cardiomyopathy) (HCC)    Hyperlipidemia    Rheumatoid  arthritis (HCC)    Past Surgical History:  Procedure Laterality Date   CHOLECYSTECTOMY N/A 08/17/2018   Procedure: LAPAROSCOPIC CHOLECYSTECTOMY;  Surgeon: [name], MD;  Location: [facility];  Service: General;  Laterality: N/A;   CORONARY BALLOON ANGIOPLASTY N/A 03/03/2017   Procedure: CORONARY BALLOON ANGIOPLASTY;  Surgeon: Marykay Lex, MD;  Location: [facility] INVASIVE CV LAB;  Service: Cardiovascular;  Laterality: N/A;   CORONARY STENT INTERVENTION N/A 03/03/2017   Procedure: CORONARY STENT INTERVENTION;  Surgeon: Marykay Lex, MD;  Location: [facility] INVASIVE CV LAB;  Service: Cardiovascular;  Laterality: N/A;   LEFT HEART CATH AND CORONARY ANGIOGRAPHY N/A 03/02/2017   Procedure: LEFT HEART CATH AND CORONARY ANGIOGRAPHY;  Surgeon: Tonny Bollman, MD;  Location: [facility] INVASIVE CV LAB;  Service: Cardiovascular;  Laterality: N/A;     Current Meds  Medication Sig   acetaminophen (TYLENOL) 325 MG tablet Take 2 tablets (650 mg total) by mouth every 6 (six) hours as needed for mild pain, fever or headache (or Fever >/= 101).   alendronate (FOSAMAX) 70 MG tablet Take 70 mg by mouth once a week.   ALLERGY RELIEF 10 MG tablet Take 10 mg by mouth daily.   allopurinol (ZYLOPRIM) 300 MG tablet Take 300 mg by mouth daily.   aspirin EC 81 MG tablet Take 1 tablet (81 mg total) by mouth daily with breakfast.   atorvastatin (LIPITOR) 10 MG tablet Take 10 mg by mouth daily.   ezetimibe (ZETIA) 10 MG tablet Take 10 mg by  mouth daily.   famotidine (PEPCID) 20 MG tablet Take 1 tablet (20 mg total) by mouth 2 (two) times daily.   ferrous sulfate 325 (65 FE) MG tablet Take 325 mg by mouth daily with breakfast.   folic acid (FOLVITE) 1 MG tablet Take 1 mg by mouth daily.   furosemide (LASIX) 20 MG tablet Take 20 mg by mouth daily.   methotrexate 2.5 MG tablet Take 20 mg by mouth every Wednesday.    metoprolol succinate (TOPROL-XL) 25 MG 24 hr tablet Take 25 mg by mouth daily.   nitroGLYCERIN (NITROSTAT) 0.4 MG SL  tablet PLACE 1 TABLET UNDER THE TONGUE EVERY 5 MINUTES AS NEEDED FOR CHEST PAIN   pantoprazole (PROTONIX) 40 MG tablet Take 1 tablet (40 mg total) by mouth daily.   sucralfate (CARAFATE) 1 g tablet Take 1 g by mouth 2 (two) times daily.   verapamil (CALAN-SR) 240 MG CR tablet Take 1 tablet (240 mg total) by mouth daily.   Vitamin D, Cholecalciferol, 25 MCG (1000 UT) TABS Take 1 tablet by mouth daily.     Allergies:   Patient has no known allergies.   ROS: No palpitations or syncope.  Prior CV studies:   The following studies were reviewed today:  Echocardiogram 08/07/2019: 1. There is severe basal septal hypertophy. Combined with a hyperdynamic  LV this creates SAM of the anterior mitral valve leaflet and a dynamic  subvalvular gradient. Peak gradient with Valsalva.. Left  ventricular ejection fraction, by  estimation, is 65 to 70%. The left ventricle has hyperdynamic function.  The left ventrical has no regional wall motion abnormalities. There is  Severe basal septal hypertophy left ventricular hypertrophy. Left  ventricular diastolic parameters are  consistent with Grade II diastolic dysfunction (pseudonormalization).  Elevated left atrial pressure.   2. Right ventricular systolic function is normal. The right ventricular  size is normal. There is mildly elevated pulmonary artery systolic  pressure.   3. Left atrial size was severely dilated. No left atrial/left atrial  appendage thrombus was detected.   4. Right atrial size was mildly dilated.   5. The mitral valve is normal in structure and function. Mild mitral  valve regurgitation. No evidence of mitral stenosis.   6. The aortic valve has an indeterminant number of cusps. Aortic valve  regurgitation is not visualized. Difficult to assess aortic valve given  subvalvular gradient. Moprhologically looks to be mild aortic stenosis.   7. The inferior vena cava is normal in size with greater than 50%  respiratory  variability, suggesting right atrial pressure of 3 mmHg.  Chest x-ray 09/27/2020: FINDINGS: Normal mediastinum and cardiac silhouette. Mild central bronchitic markings. RIGHT suprahilar vascular density again noted. No evidence of effusion, infiltrate, or pneumothorax. No acute bony abnormality.   IMPRESSION: Mild bronchitic markings.  No acute findings.  Labs/Other Tests and Data Reviewed:    EKG:  An ECG dated 09/27/2020 was personally reviewed today and demonstrated:  Sinus rhythm with LVH and repolarization abnormalities.  Recent Labs: 09/27/2020: ALT 35; B Natriuretic Peptide 397.0; BUN 22; Creatinine, Ser 0.83; Hemoglobin 12.4; Platelets 221; Potassium 3.9; Sodium 141   Recent Lipid Panel Lab Results  Component Value Date/Time   CHOL 123 09/17/2017 06:27 AM   TRIG 69 09/17/2017 06:27 AM   HDL 49 09/17/2017 06:27 AM   CHOLHDL 2.5 09/17/2017 06:27 AM   LDLCALC 60 09/17/2017 06:27 AM    Wt Readings from Last 3 Encounters:  01/20/21 179 lb (81.2 kg)  09/27/20 185 lb (  83.9 kg)  06/11/20 184 lb 6.4 oz (83.6 kg)        Objective:    Vital Signs:  BP 138/69   Ht 5\' 2"  (1.575 m)   Wt 179 lb (81.2 kg)   BMI 32.74 kg/m    I spoke with the patient's son during the encounter.  ASSESSMENT & PLAN:    1.  Hypertrophic obstructive cardiomyopathy, clinically stable at this time.  Echocardiogram from February of last year as noted above.  Plan to continue current doses of Toprol-XL and Calan SR.  She does have associated diastolic heart failure requiring Lasix, no significant weight increase.  Need to avoid intravascular volume contraction, she has tolerated the present regimen.  2.  CAD status post DES in the first obtuse marginal in September 2018.  No active angina at this time.  Continue aspirin, Zetia, and Lipitor.   Time:   Today, I have spent 6 minutes with the patient with telehealth technology discussing the above problems.     Medication Adjustments/Labs and Tests  Ordered: Current medicines are reviewed at length with the patient today.  Concerns regarding medicines are outlined above.   Tests Ordered: No orders of the defined types were placed in this encounter.   Medication Changes: No orders of the defined types were placed in this encounter.   Follow Up:  In Person  6 months.  Signed, October 2018, MD  01/20/2021 11:55 AM    [facility]

## 2021-02-05 ENCOUNTER — Other Ambulatory Visit (HOSPITAL_COMMUNITY): Payer: Self-pay | Admitting: Internal Medicine

## 2021-02-05 DIAGNOSIS — Z1231 Encounter for screening mammogram for malignant neoplasm of breast: Secondary | ICD-10-CM

## 2021-02-09 DIAGNOSIS — R6889 Other general symptoms and signs: Secondary | ICD-10-CM | POA: Insufficient documentation

## 2021-02-09 DIAGNOSIS — F321 Major depressive disorder, single episode, moderate: Secondary | ICD-10-CM | POA: Insufficient documentation

## 2021-02-09 DIAGNOSIS — Z6831 Body mass index (BMI) 31.0-31.9, adult: Secondary | ICD-10-CM | POA: Insufficient documentation

## 2021-02-09 DIAGNOSIS — J302 Other seasonal allergic rhinitis: Secondary | ICD-10-CM | POA: Insufficient documentation

## 2021-03-05 ENCOUNTER — Other Ambulatory Visit: Payer: Self-pay | Admitting: Cardiology

## 2021-03-22 ENCOUNTER — Ambulatory Visit (HOSPITAL_COMMUNITY)
Admission: RE | Admit: 2021-03-22 | Discharge: 2021-03-22 | Disposition: A | Discharge status: Home / Self Care | Payer: Medicare Other | Source: Ambulatory Visit | Attending: Internal Medicine | Admitting: Internal Medicine

## 2021-03-22 ENCOUNTER — Other Ambulatory Visit: Payer: Self-pay

## 2021-03-22 DIAGNOSIS — Z1231 Encounter for screening mammogram for malignant neoplasm of breast: Secondary | ICD-10-CM | POA: Diagnosis present

## 2021-06-23 DIAGNOSIS — D709 Neutropenia, unspecified: Secondary | ICD-10-CM | POA: Insufficient documentation

## 2021-07-26 NOTE — Progress Notes (Signed)
[facility] [address] [place] [address]   CLINIC:  Medical Oncology/Hematology  CONSULT NOTE  Patient Care Team: [name], MD as PCP - General (Internal Medicine) Satira Sark, MD as PCP - Cardiology (Cardiology)  CHIEF COMPLAINTS/PURPOSE OF CONSULTATION:  Evaluation of mild leukopenia  HISTORY OF PRESENTING ILLNESS:  Dana [name] 81 y.o. female is here at the request of her primary care provider (NP Blenda Nicely) for the evaluation of leukopenia.  Patient's primary language is Samoan - she was offered interpreter services via telephone, but she declined and indicated that she would like her son-in-law Dana [name], who is present during visit, to act as interpreter for her.  Per records sent by PCP, patient was noted to have recent CBC (06/11/2021) with mildly low WBC 3.3 with normal differential, normal Hgb 11.7/MCV 97, normal platelets 238.  Prior labs (03/19/2021) show WBC 3.3 with normal differential.  Before that, patient had normal WBC 4.1 (07/08/2020).  Review of past labs within our records show WBC predominantly on the lower end of normal, but ranging from 3.6 to 11.8 over the past 5 years.  Although she denies frequent infections, she does have occasional UTIs once or twice a year, most recently in summer 2022.  She reports intermittent nosebleeds a few times each month, and is being sent to ENT by her primary care provider.  She denies any B symptoms such as fever, chills, night sweats, unintentional weight loss.  She has not noticed any new lumps or bumps.  Of note, she takes methotrexate 20 mg weekly for her rheumatoid arthritis (prescribed by Dr. Kathlene November in [place]), as well as daily folic acid supplement.  She has no specific complaints at today's visit and reports 80% energy with 100% appetite.   MEDICAL HISTORY:  Past Medical History:  Diagnosis Date   CAD (coronary artery disease)    a. s/p DES to OM1 in 02/2017   Essential hypertension     Gout    HOCM (hypertrophic obstructive cardiomyopathy) ([place])    Hyperlipidemia    Rheumatoid arthritis ([place])     SURGICAL HISTORY: Past Surgical History:  Procedure Laterality Date   CHOLECYSTECTOMY N/A 08/17/2018   Procedure: LAPAROSCOPIC CHOLECYSTECTOMY;  Surgeon: Aviva Signs, MD;  Location: [facility] OR[facility];  Service: General;  Laterality: N/A;   CORONARY BALLOON ANGIOPLASTY N/A 03/03/2017   Procedure: CORONARY BALLOON ANGIOPLASTY;  Surgeon: Leonie Man, MD;  Location: [facility] CV LAB;  Service: Cardiovascular;  Laterality: N/A;   CORONARY STENT INTERVENTION N/A 03/03/2017   Procedure: CORONARY STENT INTERVENTION;  Surgeon: Leonie Man, MD;  Location: [facility] CV LAB;  Service: Cardiovascular;  Laterality: N/A;   LEFT HEART CATH AND CORONARY ANGIOGRAPHY N/A 03/02/2017   Procedure: LEFT HEART CATH AND CORONARY ANGIOGRAPHY;  Surgeon: Sherren Mocha, MD;  Location: [place] CV LAB;  Service: Cardiovascular;  Laterality: N/A;    SOCIAL HISTORY: Social History   Socioeconomic History   Marital status: Widowed    Spouse name: Not on file   Number of children: Not on file   Years of education: Not on file   Highest education level: Not on file  Occupational History   Not on file  Tobacco Use   Smoking status: Never   Smokeless tobacco: Never  Vaping Use   Vaping Use: Never used  Substance and Sexual Activity   Alcohol use: No   Drug use: No   Sexual activity: Not on file  Other Topics Concern  Not on file  Social History Narrative   Not on file   Social Determinants of Health   Financial Resource Strain: Not on file  Food Insecurity: Not on file  Transportation Needs: Not on file  Physical Activity: Not on file  Stress: Not on file  Social Connections: Not on file  Intimate Partner Violence: Not on file    FAMILY HISTORY: Family History  Problem Relation Age of Onset   Hypertension Mother    Liver disease Mother    Alcohol abuse Father      ALLERGIES:  has No Known Allergies.  MEDICATIONS:  Current Outpatient Medications  Medication Sig Dispense Refill   acetaminophen (TYLENOL) 325 MG tablet Take 2 tablets (650 mg total) by mouth every 6 (six) hours as needed for mild pain, fever or headache (or Fever >/= 101). 12 tablet 0   alendronate (FOSAMAX) 70 MG tablet Take 70 mg by mouth once a week.     ALLERGY RELIEF 10 MG tablet Take 10 mg by mouth daily.     allopurinol (ZYLOPRIM) 300 MG tablet Take 300 mg by mouth daily.     aspirin EC 81 MG tablet Take 1 tablet (81 mg total) by mouth daily with breakfast. 30 tablet 4   atorvastatin (LIPITOR) 10 MG tablet Take 10 mg by mouth daily.     ezetimibe (ZETIA) 10 MG tablet Take 10 mg by mouth daily.     famotidine (PEPCID) 20 MG tablet Take 1 tablet (20 mg total) by mouth 2 (two) times daily. 30 tablet 0   ferrous sulfate 325 (65 FE) MG tablet Take 325 mg by mouth daily with breakfast.     folic acid (FOLVITE) 1 MG tablet Take 1 mg by mouth daily.     furosemide (LASIX) 20 MG tablet Take 20 mg by mouth daily.     methotrexate 2.5 MG tablet Take 20 mg by mouth every Wednesday.      metoprolol succinate (TOPROL-XL) 25 MG 24 hr tablet Take 25 mg by mouth daily.     nitroGLYCERIN (NITROSTAT) 0.4 MG SL tablet PLACE 1 TABLET UNDER THE TONGUE EVERY 5 MINUTES AS NEEDED FOR CHEST PAIN 25 tablet 2   pantoprazole (PROTONIX) 40 MG tablet Take 1 tablet (40 mg total) by mouth daily. 30 tablet 4   sucralfate (CARAFATE) 1 g tablet Take 1 g by mouth 2 (two) times daily.     verapamil (CALAN-SR) 240 MG CR tablet Take 1 tablet (240 mg total) by mouth daily. 30 tablet 11   Vitamin D, Cholecalciferol, 25 MCG (1000 UT) TABS Take 1 tablet by mouth daily.     No current facility-administered medications for this visit.    REVIEW OF SYSTEMS:   Review of Systems  Constitutional:  Negative for appetite change, chills, diaphoresis, fever and unexpected weight change.  HENT:   Negative for lump/mass and  nosebleeds.   Eyes:  Negative for eye problems.  Respiratory:  Negative for cough, hemoptysis and shortness of breath.   Cardiovascular:  Negative for chest pain, leg swelling and palpitations.  Gastrointestinal:  Negative for abdominal pain, blood in stool, constipation, diarrhea, nausea and vomiting.  Genitourinary:  Negative for hematuria.   Skin: Negative.   Neurological:  Negative for dizziness, headaches and light-headedness.  Hematological:  Does not bruise/bleed easily.     PHYSICAL EXAMINATION: ECOG PERFORMANCE STATUS: 0 - Asymptomatic  There were no vitals filed for this visit. There were no vitals filed for this visit.  Physical Exam  Constitutional:      Appearance: Normal appearance. She is obese.  HENT:     Head: Normocephalic and atraumatic.     Mouth/Throat:     Mouth: Mucous membranes are moist.  Eyes:     Extraocular Movements: Extraocular movements intact.     Pupils: Pupils are equal, round, and reactive to light.  Cardiovascular:     Rate and Rhythm: Normal rate and regular rhythm.     Pulses: Normal pulses.     Heart sounds: Murmur heard.  Pulmonary:     Effort: Pulmonary effort is normal.     Breath sounds: Normal breath sounds.  Abdominal:     General: Bowel sounds are normal.     Palpations: Abdomen is soft.     Tenderness: There is no abdominal tenderness.  Musculoskeletal:        General: No swelling.     Right lower leg: No edema.     Left lower leg: No edema.  Lymphadenopathy:     Cervical: No cervical adenopathy.  Skin:    General: Skin is warm and dry.  Neurological:     General: No focal deficit present.     Mental Status: She is alert and oriented to person, place, and time.  Psychiatric:        Mood and Affect: Mood normal.        Behavior: Behavior normal.      LABORATORY DATA:  I have reviewed the data as listed No results found for this or any previous visit (from the past 2160 hour(s)).  RADIOGRAPHIC STUDIES: I have  personally reviewed the radiological images as listed and agreed with the findings in the report. No results found.  ASSESSMENT & PLAN: 1.  Leukopenia - Here at the request of her primary care provider (NP Blenda Nicely) for the evaluation of leukopenia - Per records sent by PCP, patient was noted to have recent CBC (06/11/2021) with mildly low WBC 3.3 with normal differential, normal Hgb 11.7/MCV 97, normal platelets 238.  Prior labs (03/19/2021) show WBC 3.3 with normal differential.  Before that, patient had normal WBC 4.1 (07/08/2020). - She denies any frequent infections or B symptoms - She takes methotrexate 20 mg weekly (prescribed by Dr. Kathlene November in [place]), as well as daily folic acid supplement. - Differential diagnosis includes nutritional deficiencies, early MDS, benign ethnic neutropenia, or bone marrow suppression related to methotrexate - PLAN: Labs today with CBC, LDH, hepatitis, B12/methylmalonic acid, folate/homocystine, copper  - RTC in 3 weeks to discuss results   2.  Iron deficiency anemia - Being managed by primary care provider - Currently taking ferrous sulfate 325 mg twice daily - Most recent labs sent by PCP (03/19/2021): Show ferritin 165, iron saturation 59%, and TIBC 271 - PLAN: Hemoglobin and iron levels seem to be well managed by PCP at this time, without need for IV iron infusions. - Continue to defer management of IDA to primary care provider, but we remain available to assist if needed.  3.  Other history - PMH:  Hypertrophic obstructive cardiomyopathy, hypertension, CAD s/p stents, diastolic congestive heart failure, gouty arthritis, hyperlipidemia, GERD, iron deficiency anemia, and rheumatoid arthritis - SOCIAL: She lives at home.  She worked as a Agricultural engineer.  No tobacco, alcohol, illicit drug use. - FAMILY: No known blood disorders or malignancy in the patient's family.   PLAN SUMMARY & DISPOSITION: Labs today RTC in 3 weeks to discuss results   All  questions were answered. The patient knows  to call the clinic with any problems, questions or concerns.   Medical decision making: Moderate  Time spent on visit: I spent 25 minutes counseling the patient face to face. The total time spent in the appointment was 40 minutes and more than 50% was on counseling.  I, Tarri Abernethy PA-C, have seen this patient in conjunction with Dr. Derek Jack.  Greater than 50% of visit was performed by Dr. Delton Coombes.     Harriett Rush, PA-C 07/27/2021 9:31 AM  DR. Jessicia Napolitano: I have independently evaluated this patient and formulated my assessment and plan.  I agree with HPI, assessment and plan written by Casey Burkitt, PA-C.  Patient was evaluated for mild leukopenia without neutropenia in the setting of methotrexate 20 mg weekly for rheumatoid arthritis.  She reports that she has been on it for couple of years.  We will exclude other nutritional deficiencies and infectious etiologies.  If there is significant neutropenia, will talk to Dr. Kathlene November (rheumatology) and see if we can decrease the dose of methotrexate.  She reports that she has been taking folic acid daily.  RTC 3 weeks to discuss.

## 2021-07-27 ENCOUNTER — Other Ambulatory Visit: Payer: Self-pay

## 2021-07-27 ENCOUNTER — Inpatient Hospital Stay (HOSPITAL_COMMUNITY): Payer: Medicare Other | Attending: Physician Assistant | Admitting: Hematology

## 2021-07-27 ENCOUNTER — Encounter (HOSPITAL_COMMUNITY): Payer: Medicare Other | Admitting: Physician Assistant

## 2021-07-27 ENCOUNTER — Inpatient Hospital Stay (HOSPITAL_COMMUNITY): Payer: Medicare Other

## 2021-07-27 ENCOUNTER — Encounter (HOSPITAL_COMMUNITY): Payer: Self-pay | Admitting: Hematology

## 2021-07-27 VITALS — BP 142/72 | Temp 98.1°F | Resp 17 | Wt 180.1 lb

## 2021-07-27 DIAGNOSIS — I10 Essential (primary) hypertension: Secondary | ICD-10-CM | POA: Insufficient documentation

## 2021-07-27 DIAGNOSIS — I2510 Atherosclerotic heart disease of native coronary artery without angina pectoris: Secondary | ICD-10-CM | POA: Diagnosis not present

## 2021-07-27 DIAGNOSIS — Z79899 Other long term (current) drug therapy: Secondary | ICD-10-CM | POA: Insufficient documentation

## 2021-07-27 DIAGNOSIS — Z79631 Long term (current) use of antimetabolite agent: Secondary | ICD-10-CM | POA: Insufficient documentation

## 2021-07-27 DIAGNOSIS — K219 Gastro-esophageal reflux disease without esophagitis: Secondary | ICD-10-CM | POA: Insufficient documentation

## 2021-07-27 DIAGNOSIS — E785 Hyperlipidemia, unspecified: Secondary | ICD-10-CM | POA: Insufficient documentation

## 2021-07-27 DIAGNOSIS — D509 Iron deficiency anemia, unspecified: Secondary | ICD-10-CM | POA: Diagnosis not present

## 2021-07-27 DIAGNOSIS — Z7982 Long term (current) use of aspirin: Secondary | ICD-10-CM | POA: Diagnosis not present

## 2021-07-27 DIAGNOSIS — M069 Rheumatoid arthritis, unspecified: Secondary | ICD-10-CM | POA: Diagnosis not present

## 2021-07-27 DIAGNOSIS — M109 Gout, unspecified: Secondary | ICD-10-CM | POA: Insufficient documentation

## 2021-07-27 DIAGNOSIS — D72819 Decreased white blood cell count, unspecified: Secondary | ICD-10-CM

## 2021-07-27 DIAGNOSIS — I421 Obstructive hypertrophic cardiomyopathy: Secondary | ICD-10-CM | POA: Insufficient documentation

## 2021-07-27 DIAGNOSIS — I5032 Chronic diastolic (congestive) heart failure: Secondary | ICD-10-CM | POA: Insufficient documentation

## 2021-07-27 DIAGNOSIS — I110 Hypertensive heart disease with heart failure: Secondary | ICD-10-CM | POA: Diagnosis not present

## 2021-07-27 LAB — CBC WITH DIFFERENTIAL/PLATELET
Abs Immature Granulocytes: 0.01 10*3/uL (ref 0.00–0.07)
Basophils Absolute: 0 10*3/uL (ref 0.0–0.1)
Basophils Relative: 1 %
Eosinophils Absolute: 0.2 10*3/uL (ref 0.0–0.5)
Eosinophils Relative: 6 %
HCT: 35.9 % — ABNORMAL LOW (ref 36.0–46.0)
Hemoglobin: 12.3 g/dL (ref 12.0–15.0)
Immature Granulocytes: 0 %
Lymphocytes Relative: 26 %
Lymphs Abs: 0.9 10*3/uL (ref 0.7–4.0)
MCH: 34 pg (ref 26.0–34.0)
MCHC: 34.3 g/dL (ref 30.0–36.0)
MCV: 99.2 fL (ref 80.0–100.0)
Monocytes Absolute: 0.5 10*3/uL (ref 0.1–1.0)
Monocytes Relative: 13 %
Neutro Abs: 1.9 10*3/uL (ref 1.7–7.7)
Neutrophils Relative %: 54 %
Platelets: 190 10*3/uL (ref 150–400)
RBC: 3.62 MIL/uL — ABNORMAL LOW (ref 3.87–5.11)
RDW: 14.4 % (ref 11.5–15.5)
WBC: 3.4 10*3/uL — ABNORMAL LOW (ref 4.0–10.5)
nRBC: 0 % (ref 0.0–0.2)

## 2021-07-27 LAB — FOLATE: Folate: 15.2 ng/mL (ref >5.9)

## 2021-07-27 LAB — HEPATITIS B SURFACE ANTIGEN: Hepatitis B Surface Ag: NONREACTIVE

## 2021-07-27 LAB — HEPATITIS B SURFACE ANTIBODY,QUALITATIVE: Hep B S Ab: REACTIVE — AB

## 2021-07-27 LAB — LACTATE DEHYDROGENASE: LDH: 197 U/L — ABNORMAL HIGH (ref 98–192)

## 2021-07-27 LAB — HEPATITIS B CORE ANTIBODY, TOTAL: Hep B Core Total Ab: REACTIVE — AB

## 2021-07-27 LAB — VITAMIN B12: Vitamin B-12: 574 pg/mL (ref 180–914)

## 2021-07-27 NOTE — Patient Instructions (Signed)
[facility] at [facility] Discharge Instructions  You were seen today by Dr. Ellin SabaKatragadda & Rojelio Brennerebekah Vearl Allbaugh PA-C for your low white blood cells.  As we discussed, your low white blood cells may be caused by nutritional deficiencies (low vitamins), certain medications (such as your methotrexate), certain infections, and in some cases certain bone marrow conditions.  LABS: Check labs today before you leave the hospital (on the first floor)  MEDICATIONS: No changes to home medications  FOLLOW-UP APPOINTMENT: Office visit in 3 weeks to discuss results   Thank you for choosing [facility] at [facility] to provide your oncology and hematology care.  To afford each patient quality time with our provider, please arrive at least 15 minutes before your scheduled appointment time.   If you have a lab appointment with the Cancer Center please come in thru the Main Entrance and check in at the main information desk.  You need to re-schedule your appointment should you arrive 10 or more minutes late.  We strive to give you quality time with our providers, and arriving late affects you and other patients whose appointments are after yours.  Also, if you no show three or more times for appointments you may be dismissed from the clinic at the providers discretion.     Again, thank you for choosing [facility].  Our hope is that these requests will decrease the amount of time that you wait before being seen by our physicians.       _____________________________________________________________  Should you have questions after your visit to [facility], please contact our office at [phone] and follow the prompts.  Our office hours are 8:00 a.m. and 4:30 p.m. Monday - Friday.  Please note that voicemails left after 4:00 p.m. may not be returned until the following business day.  We are closed weekends and major holidays.  You do have  access to a nurse 24-7, just call the main number to the clinic [phone] and do not press any options, hold on the line and a nurse will answer the phone.    For prescription refill requests, have your pharmacy contact our office and allow 72 hours.    Due to Covid, you will need to wear a mask upon entering the hospital. If you do not have a mask, a mask will be given to you at the Main Entrance upon arrival. For doctor visits, patients may have 1 support person age [id number] or older with them. For treatment visits, patients can not have anyone with them due to social distancing guidelines and our immunocompromised population.

## 2021-07-28 LAB — PATHOLOGIST SMEAR REVIEW

## 2021-07-28 LAB — HCV AB W REFLEX TO QUANT PCR: HCV Ab: <0.1 s/co ratio (ref 0.0–0.9)

## 2021-07-28 LAB — HOMOCYSTEINE: Homocysteine: 13.1 umol/L (ref 0.0–19.2)

## 2021-07-28 LAB — HCV INTERPRETATION

## 2021-07-29 LAB — COPPER, SERUM: Copper: 91 ug/dL (ref 80–158)

## 2021-07-31 ENCOUNTER — Encounter (HOSPITAL_COMMUNITY): Payer: Self-pay

## 2021-07-31 ENCOUNTER — Inpatient Hospital Stay (HOSPITAL_COMMUNITY)
Admission: EM | Admit: 2021-07-31 | Discharge: 2021-08-03 | DRG: 291 | Disposition: A | Discharge status: Home / Self Care | Payer: Medicare Other | Attending: Internal Medicine | Admitting: Internal Medicine

## 2021-07-31 ENCOUNTER — Other Ambulatory Visit: Payer: Self-pay

## 2021-07-31 ENCOUNTER — Emergency Department (HOSPITAL_COMMUNITY): Payer: Medicare Other

## 2021-07-31 DIAGNOSIS — I5033 Acute on chronic diastolic (congestive) heart failure: Secondary | ICD-10-CM | POA: Diagnosis present

## 2021-07-31 DIAGNOSIS — M059 Rheumatoid arthritis with rheumatoid factor, unspecified: Secondary | ICD-10-CM | POA: Insufficient documentation

## 2021-07-31 DIAGNOSIS — Z20822 Contact with and (suspected) exposure to covid-19: Secondary | ICD-10-CM | POA: Diagnosis present

## 2021-07-31 DIAGNOSIS — Z7983 Long term (current) use of bisphosphonates: Secondary | ICD-10-CM

## 2021-07-31 DIAGNOSIS — I340 Nonrheumatic mitral (valve) insufficiency: Secondary | ICD-10-CM | POA: Diagnosis present

## 2021-07-31 DIAGNOSIS — I2510 Atherosclerotic heart disease of native coronary artery without angina pectoris: Secondary | ICD-10-CM | POA: Diagnosis present

## 2021-07-31 DIAGNOSIS — Z7902 Long term (current) use of antithrombotics/antiplatelets: Secondary | ICD-10-CM

## 2021-07-31 DIAGNOSIS — I422 Other hypertrophic cardiomyopathy: Secondary | ICD-10-CM

## 2021-07-31 DIAGNOSIS — E785 Hyperlipidemia, unspecified: Secondary | ICD-10-CM | POA: Diagnosis present

## 2021-07-31 DIAGNOSIS — I110 Hypertensive heart disease with heart failure: Principal | ICD-10-CM | POA: Diagnosis present

## 2021-07-31 DIAGNOSIS — M069 Rheumatoid arthritis, unspecified: Secondary | ICD-10-CM | POA: Diagnosis present

## 2021-07-31 DIAGNOSIS — R079 Chest pain, unspecified: Secondary | ICD-10-CM | POA: Diagnosis not present

## 2021-07-31 DIAGNOSIS — M810 Age-related osteoporosis without current pathological fracture: Secondary | ICD-10-CM | POA: Insufficient documentation

## 2021-07-31 DIAGNOSIS — I10 Essential (primary) hypertension: Secondary | ICD-10-CM | POA: Diagnosis present

## 2021-07-31 DIAGNOSIS — Z8249 Family history of ischemic heart disease and other diseases of the circulatory system: Secondary | ICD-10-CM

## 2021-07-31 DIAGNOSIS — E782 Mixed hyperlipidemia: Secondary | ICD-10-CM | POA: Diagnosis present

## 2021-07-31 DIAGNOSIS — I421 Obstructive hypertrophic cardiomyopathy: Secondary | ICD-10-CM | POA: Diagnosis present

## 2021-07-31 DIAGNOSIS — M109 Gout, unspecified: Secondary | ICD-10-CM | POA: Diagnosis present

## 2021-07-31 DIAGNOSIS — I080 Rheumatic disorders of both mitral and aortic valves: Secondary | ICD-10-CM | POA: Diagnosis present

## 2021-07-31 DIAGNOSIS — E860 Dehydration: Secondary | ICD-10-CM | POA: Diagnosis present

## 2021-07-31 DIAGNOSIS — Z79899 Other long term (current) drug therapy: Secondary | ICD-10-CM

## 2021-07-31 DIAGNOSIS — R001 Bradycardia, unspecified: Secondary | ICD-10-CM

## 2021-07-31 DIAGNOSIS — Z955 Presence of coronary angioplasty implant and graft: Secondary | ICD-10-CM

## 2021-07-31 DIAGNOSIS — J42 Unspecified chronic bronchitis: Secondary | ICD-10-CM | POA: Diagnosis present

## 2021-07-31 DIAGNOSIS — Z7982 Long term (current) use of aspirin: Secondary | ICD-10-CM

## 2021-07-31 LAB — CBC
HCT: 35.2 % — ABNORMAL LOW (ref 36.0–46.0)
Hemoglobin: 11.5 g/dL — ABNORMAL LOW (ref 12.0–15.0)
MCH: 32.5 pg (ref 26.0–34.0)
MCHC: 32.7 g/dL (ref 30.0–36.0)
MCV: 99.4 fL (ref 80.0–100.0)
Platelets: 199 10*3/uL (ref 150–400)
RBC: 3.54 MIL/uL — ABNORMAL LOW (ref 3.87–5.11)
RDW: 14.6 % (ref 11.5–15.5)
WBC: 4.2 10*3/uL (ref 4.0–10.5)
nRBC: 0 % (ref 0.0–0.2)

## 2021-07-31 LAB — BASIC METABOLIC PANEL
Anion gap: 8 (ref 5–15)
BUN: 16 mg/dL (ref 8–23)
CO2: 24 mmol/L (ref 22–32)
Calcium: 8.5 mg/dL — ABNORMAL LOW (ref 8.9–10.3)
Chloride: 106 mmol/L (ref 98–111)
Creatinine, Ser: 0.86 mg/dL (ref 0.44–1.00)
GFR, Estimated: >60 mL/min (ref >60)
Glucose, Bld: 136 mg/dL — ABNORMAL HIGH (ref 70–99)
Potassium: 3.9 mmol/L (ref 3.5–5.1)
Sodium: 138 mmol/L (ref 135–145)

## 2021-07-31 LAB — TROPONIN I (HIGH SENSITIVITY): Troponin I (High Sensitivity): 73 ng/L — ABNORMAL HIGH (ref <18)

## 2021-07-31 MED ORDER — FAMOTIDINE IN NACL 20-0.9 MG/50ML-% IV SOLN
20.0000 mg | Freq: Once | INTRAVENOUS | Status: AC
  Administered 2021-07-31: 20 mg via INTRAVENOUS
  Filled 2021-07-31: qty 50

## 2021-07-31 MED ORDER — ASPIRIN 81 MG PO CHEW
324.0000 mg | CHEWABLE_TABLET | Freq: Once | ORAL | Status: AC
  Administered 2021-07-31: 243 mg via ORAL
  Filled 2021-07-31: qty 4

## 2021-07-31 NOTE — ED Triage Notes (Signed)
Pt reports mid chest pain that started about 3 hours ago, pt also reports SOB that started first.

## 2021-07-31 NOTE — ED Notes (Signed)
X-ray at bedside

## 2021-07-31 NOTE — ED Provider Notes (Signed)
[facility] Emergency Department Provider Note MRN:  [id number]  Arrival date & time: 08/01/21     Chief Complaint   Chest pain History of Present Illness   Dana [name] is a 81 y.o. year-old female with a history of CAD, HOCM presenting to the ED with chief complaint of chest pain.  Chest pain or shortness of breath starting a few hours ago.  Starting shortly after dinner.  No recent fever or cough.  No abdominal pain, no leg pain or swelling  Review of Systems  A thorough review of systems was obtained and all systems are negative except as noted in the HPI and PMH.   Patient's Health History    Past Medical History:  Diagnosis Date   CAD (coronary artery disease)    a. s/p DES to OM1 in 02/2017   Essential hypertension    Gout    HOCM (hypertrophic obstructive cardiomyopathy) (HCC)    Hyperlipidemia    Rheumatoid arthritis ([place])     Past Surgical History:  Procedure Laterality Date   CHOLECYSTECTOMY N/A 08/17/2018   Procedure: LAPAROSCOPIC CHOLECYSTECTOMY;  Surgeon: Aviva Signs, MD;  Location: [facility];  Service: General;  Laterality: N/A;   CORONARY BALLOON ANGIOPLASTY N/A 03/03/2017   Procedure: CORONARY BALLOON ANGIOPLASTY;  Surgeon: Leonie Man, MD;  Location: [facility];  Service: Cardiovascular;  Laterality: N/A;   CORONARY STENT INTERVENTION N/A 03/03/2017   Procedure: CORONARY STENT INTERVENTION;  Surgeon: Leonie Man, MD;  Location: [place] [facility];  Service: Cardiovascular;  Laterality: N/A;   LEFT HEART CATH AND CORONARY ANGIOGRAPHY N/A 03/02/2017   Procedure: LEFT HEART CATH AND CORONARY ANGIOGRAPHY;  Surgeon: Sherren Mocha, MD;  Location: [facility];  Service: Cardiovascular;  Laterality: N/A;    Family History  Problem Relation Age of Onset   Hypertension Mother    Liver disease Mother    Alcohol abuse Father     Social History   Socioeconomic History   Marital status: Widowed    Spouse name: Not on  file   Number of children: Not on file   Years of education: Not on file   Highest education level: Not on file  Occupational History   Not on file  Tobacco Use   Smoking status: Never   Smokeless tobacco: Never  Vaping Use   Vaping Use: Never used  Substance and Sexual Activity   Alcohol use: No   Drug use: No   Sexual activity: Not on file  Other Topics Concern   Not on file  Social History Narrative   Not on file   Social Determinants of Health   Financial Resource Strain: Not on file  Food Insecurity: Not on file  Transportation Needs: Not on file  Physical Activity: Not on file  Stress: Not on file  Social Connections: Not on file  Intimate Partner Violence: Not on file     Physical Exam   Vitals:   08/01/21 0141 08/01/21 0212  BP: 100/63 115/61  Pulse: (!) 42 (!) 47  Resp: 16   Temp:    SpO2: 94% 95%    CONSTITUTIONAL: Chronically ill-appearing, NAD NEURO/PSYCH:  Alert and oriented x 3, no focal deficits EYES:  eyes equal and reactive ENT/NECK:  no LAD, no JVD CARDIO: Regular rate, well-perfused, normal S1 and S2 PULM:  CTAB no wheezing or rhonchi GI/GU:  non-distended, non-tender MSK/SPINE:  No gross deformities, no edema SKIN:  no rash, atraumatic   *Additional and/or  pertinent findings included in MDM below  Diagnostic and Interventional Summary    EKG Interpretation  Date/Time:  Saturday July 31 2021 22:11:12 EST Ventricular Rate:  60 PR Interval:    QRS Duration: 94 QT Interval:  506 QTC Calculation: 506 R Axis:   17 Text Interpretation: Atrial fibrillation with a competing junctional pacemaker Left ventricular hypertrophy with repolarization abnormality ( R in aVL , Cornell product , Romhilt-Estes ) Anteroseptal infarct , age undetermined Abnormal ECG When compared with ECG of 27-Sep-2020 09:56, PREVIOUS ECG IS PRESENT No significant change was found Confirmed by Noemi Chapel [phone]) on 07/31/2021 10:35:06 PM       Labs Reviewed   CBC - Abnormal; Notable for the following components:      Result Value   RBC 3.54 (*)    Hemoglobin 11.5 (*)    HCT 35.2 (*)    All other components within normal limits  BASIC METABOLIC PANEL - Abnormal; Notable for the following components:   Glucose, Bld 136 (*)    Calcium 8.5 (*)    All other components within normal limits  BRAIN NATRIURETIC PEPTIDE - Abnormal; Notable for the following components:   B Natriuretic Peptide 406.0 (*)    All other components within normal limits  TROPONIN I (HIGH SENSITIVITY) - Abnormal; Notable for the following components:   Troponin I (High Sensitivity) 73 (*)    All other components within normal limits  TROPONIN I (HIGH SENSITIVITY) - Abnormal; Notable for the following components:   Troponin I (High Sensitivity) 70 (*)    All other components within normal limits    CT Angio Chest Pulmonary Embolism (PE) W or WO Contrast  Final Result    DG Chest Port 1 View  Final Result      Medications  famotidine (PEPCID) IVPB 20 mg premix (0 mg Intravenous Stopped 08/01/21 0013)  aspirin chewable tablet 324 mg (243 mg Oral Given 07/31/21 2334)  iohexol (OMNIPAQUE) 350 MG/ML injection 75 mL (75 mLs Intravenous Contrast Given 08/01/21 0028)     Procedures  /  Critical Care Procedures  ED Course and Medical Decision Making  Initial Impression and Ddx Concern for ACS, CHF exacerbation, also considering PE.  Patient with soft blood pressure, a bit tachypneic.  Awaiting EKG, chest x-ray, labs.  Past medical/surgical history that increases complexity of ED encounter: CAD  Interpretation of Diagnostics I personally reviewed the EKG and my interpretation is as follows: Junctional rhythm, nonspecific ST and T wave changes    Labs reveal mildly elevated troponin, mildly elevated BNP.  CTA is without PE.  Patient Reassessment and Ultimate Disposition/Management Patient is more comfortable but has exhibited some intermittent bradycardia and soft blood  pressures.  We will request hospitalist admission for cardiac monitoring and chest pain rule out.  Patient management required discussion with the following services or consulting groups:  Hospitalist Service  Complexity of Problems Addressed Acute illness or injury that poses threat of life of bodily function  Additional Data Reviewed and Analyzed Further history obtained from: Recent Consult notes  Factors Impacting ED Encounter Risk Consideration of hospitalization  Barth Kirks. [place]onia Small, [place] [email or web address]  Final Clinical Impressions(s) / ED Diagnoses     ICD-10-CM   1. Chest pain, unspecified type  R07.9       ED Discharge Orders     None        Discharge Instructions Discussed with and Provided to Patient:   Discharge Instructions  None      Maudie Flakes, MD 08/01/21 [phone]

## 2021-08-01 ENCOUNTER — Emergency Department (HOSPITAL_COMMUNITY): Payer: Medicare Other

## 2021-08-01 ENCOUNTER — Inpatient Hospital Stay (HOSPITAL_COMMUNITY): Payer: Medicare Other

## 2021-08-01 DIAGNOSIS — I110 Hypertensive heart disease with heart failure: Secondary | ICD-10-CM | POA: Diagnosis present

## 2021-08-01 DIAGNOSIS — I421 Obstructive hypertrophic cardiomyopathy: Secondary | ICD-10-CM

## 2021-08-01 DIAGNOSIS — Z20822 Contact with and (suspected) exposure to covid-19: Secondary | ICD-10-CM | POA: Diagnosis present

## 2021-08-01 DIAGNOSIS — Z7983 Long term (current) use of bisphosphonates: Secondary | ICD-10-CM | POA: Diagnosis not present

## 2021-08-01 DIAGNOSIS — R079 Chest pain, unspecified: Secondary | ICD-10-CM

## 2021-08-01 DIAGNOSIS — E860 Dehydration: Secondary | ICD-10-CM | POA: Diagnosis present

## 2021-08-01 DIAGNOSIS — M069 Rheumatoid arthritis, unspecified: Secondary | ICD-10-CM | POA: Diagnosis present

## 2021-08-01 DIAGNOSIS — Z79899 Other long term (current) drug therapy: Secondary | ICD-10-CM | POA: Diagnosis not present

## 2021-08-01 DIAGNOSIS — I080 Rheumatic disorders of both mitral and aortic valves: Secondary | ICD-10-CM | POA: Diagnosis present

## 2021-08-01 DIAGNOSIS — J42 Unspecified chronic bronchitis: Secondary | ICD-10-CM | POA: Diagnosis present

## 2021-08-01 DIAGNOSIS — Z8249 Family history of ischemic heart disease and other diseases of the circulatory system: Secondary | ICD-10-CM | POA: Diagnosis not present

## 2021-08-01 DIAGNOSIS — R001 Bradycardia, unspecified: Secondary | ICD-10-CM

## 2021-08-01 DIAGNOSIS — I5031 Acute diastolic (congestive) heart failure: Secondary | ICD-10-CM

## 2021-08-01 DIAGNOSIS — I5033 Acute on chronic diastolic (congestive) heart failure: Secondary | ICD-10-CM | POA: Diagnosis present

## 2021-08-01 DIAGNOSIS — E785 Hyperlipidemia, unspecified: Secondary | ICD-10-CM | POA: Diagnosis present

## 2021-08-01 DIAGNOSIS — M109 Gout, unspecified: Secondary | ICD-10-CM | POA: Diagnosis present

## 2021-08-01 DIAGNOSIS — Z955 Presence of coronary angioplasty implant and graft: Secondary | ICD-10-CM | POA: Diagnosis not present

## 2021-08-01 DIAGNOSIS — I2510 Atherosclerotic heart disease of native coronary artery without angina pectoris: Secondary | ICD-10-CM | POA: Diagnosis present

## 2021-08-01 DIAGNOSIS — Z7982 Long term (current) use of aspirin: Secondary | ICD-10-CM | POA: Diagnosis not present

## 2021-08-01 DIAGNOSIS — E782 Mixed hyperlipidemia: Secondary | ICD-10-CM | POA: Diagnosis not present

## 2021-08-01 DIAGNOSIS — Z7902 Long term (current) use of antithrombotics/antiplatelets: Secondary | ICD-10-CM | POA: Diagnosis not present

## 2021-08-01 LAB — ECHOCARDIOGRAM COMPLETE
AV Mean grad: 23 mmHg
AV Peak grad: 43 mmHg
Ao pk vel: 3.28 m/s
Area-P 1/2: 3.31 cm2
Height: 61 in
S' Lateral: 2.6 cm
Weight: 2960 oz

## 2021-08-01 LAB — BRAIN NATRIURETIC PEPTIDE: B Natriuretic Peptide: 406 pg/mL — ABNORMAL HIGH (ref 0.0–100.0)

## 2021-08-01 LAB — RESP PANEL BY RT-PCR (FLU A&B, COVID) ARPGX2
Influenza A by PCR: NEGATIVE
Influenza B by PCR: NEGATIVE
SARS Coronavirus 2 by RT PCR: NEGATIVE

## 2021-08-01 LAB — METHYLMALONIC ACID, SERUM: Methylmalonic Acid, Quantitative: 272 nmol/L (ref 0–378)

## 2021-08-01 LAB — TROPONIN I (HIGH SENSITIVITY)
Troponin I (High Sensitivity): 70 ng/L — ABNORMAL HIGH (ref <18)
Troponin I (High Sensitivity): 78 ng/L — ABNORMAL HIGH (ref <18)

## 2021-08-01 MED ORDER — FERROUS SULFATE 325 (65 FE) MG PO TABS
325.0000 mg | ORAL_TABLET | Freq: Every day | ORAL | Status: DC
  Administered 2021-08-02 – 2021-08-03 (×2): 325 mg via ORAL
  Filled 2021-08-01 (×2): qty 1

## 2021-08-01 MED ORDER — SODIUM CHLORIDE 0.9 % IV SOLN: 250.0000 mL | INTRAVENOUS | Status: DC | PRN

## 2021-08-01 MED ORDER — ALLOPURINOL 300 MG PO TABS
300.0000 mg | ORAL_TABLET | Freq: Every day | ORAL | Status: DC
  Administered 2021-08-02 – 2021-08-03 (×2): 300 mg via ORAL
  Filled 2021-08-01 (×2): qty 1

## 2021-08-01 MED ORDER — EZETIMIBE 10 MG PO TABS
10.0000 mg | ORAL_TABLET | Freq: Every day | ORAL | Status: DC
  Administered 2021-08-01 – 2021-08-03 (×3): 10 mg via ORAL
  Filled 2021-08-01 (×3): qty 1

## 2021-08-01 MED ORDER — FUROSEMIDE 10 MG/ML IJ SOLN
40.0000 mg | Freq: Once | INTRAMUSCULAR | Status: AC
  Administered 2021-08-01: 40 mg via INTRAVENOUS
  Filled 2021-08-01: qty 4

## 2021-08-01 MED ORDER — SALINE SPRAY 0.65 % NA SOLN
1.0000 | NASAL | Status: DC | PRN
  Filled 2021-08-01: qty 44

## 2021-08-01 MED ORDER — ASPIRIN EC 81 MG PO TBEC: 81.0000 mg | DELAYED_RELEASE_TABLET | Freq: Every day | ORAL | Status: DC

## 2021-08-01 MED ORDER — ASPIRIN EC 81 MG PO TBEC
81.0000 mg | DELAYED_RELEASE_TABLET | Freq: Every day | ORAL | Status: DC
  Administered 2021-08-02 – 2021-08-03 (×2): 81 mg via ORAL
  Filled 2021-08-01 (×2): qty 1

## 2021-08-01 MED ORDER — ACETAMINOPHEN 325 MG PO TABS: 650.0000 mg | ORAL_TABLET | ORAL | Status: DC | PRN

## 2021-08-01 MED ORDER — IOHEXOL 350 MG/ML SOLN
75.0000 mL | Freq: Once | INTRAVENOUS | Status: AC | PRN
  Administered 2021-08-01: 75 mL via INTRAVENOUS

## 2021-08-01 MED ORDER — METHOTREXATE 2.5 MG PO TABS: 20.0000 mg | ORAL_TABLET | ORAL | Status: DC

## 2021-08-01 MED ORDER — PERFLUTREN LIPID MICROSPHERE
1.0000 mL | INTRAVENOUS | Status: AC | PRN
  Administered 2021-08-01: 2 mL via INTRAVENOUS
  Filled 2021-08-01: qty 10

## 2021-08-01 MED ORDER — SODIUM CHLORIDE 0.9% FLUSH: 3.0000 mL | INTRAVENOUS | Status: DC | PRN

## 2021-08-01 MED ORDER — ATORVASTATIN CALCIUM 10 MG PO TABS
10.0000 mg | ORAL_TABLET | Freq: Every day | ORAL | Status: DC
  Administered 2021-08-01 – 2021-08-03 (×3): 10 mg via ORAL
  Filled 2021-08-01 (×3): qty 1

## 2021-08-01 MED ORDER — FOLIC ACID 1 MG PO TABS
1.0000 mg | ORAL_TABLET | Freq: Every day | ORAL | Status: DC
  Administered 2021-08-01 – 2021-08-03 (×3): 1 mg via ORAL
  Filled 2021-08-01 (×3): qty 1

## 2021-08-01 MED ORDER — SODIUM CHLORIDE 0.9% FLUSH
3.0000 mL | Freq: Two times a day (BID) | INTRAVENOUS | Status: DC
  Administered 2021-08-01 – 2021-08-03 (×5): 3 mL via INTRAVENOUS

## 2021-08-01 MED ORDER — FAMOTIDINE 20 MG PO TABS
20.0000 mg | ORAL_TABLET | Freq: Two times a day (BID) | ORAL | Status: DC
  Administered 2021-08-01 – 2021-08-03 (×4): 20 mg via ORAL
  Filled 2021-08-01 (×4): qty 1

## 2021-08-01 MED ORDER — SUCRALFATE 1 G PO TABS
1.0000 g | ORAL_TABLET | Freq: Two times a day (BID) | ORAL | Status: DC
Start: 2021-08-01 — End: 2021-08-03
  Administered 2021-08-01 – 2021-08-03 (×4): 1 g via ORAL
  Filled 2021-08-01 (×4): qty 1

## 2021-08-01 MED ORDER — ONDANSETRON HCL 4 MG/2ML IJ SOLN: 4.0000 mg | Freq: Four times a day (QID) | INTRAMUSCULAR | Status: DC | PRN

## 2021-08-01 MED ORDER — PANTOPRAZOLE SODIUM 40 MG PO TBEC
40.0000 mg | DELAYED_RELEASE_TABLET | Freq: Every day | ORAL | Status: DC
  Administered 2021-08-01 – 2021-08-03 (×3): 40 mg via ORAL
  Filled 2021-08-01 (×3): qty 1

## 2021-08-01 NOTE — Progress Notes (Signed)
°  Echocardiogram 2D Echocardiogram with contrast has been performed.  Roosvelt Maser F 08/01/2021, 4:56 PM

## 2021-08-01 NOTE — Plan of Care (Signed)
  Problem: Clinical Measurements: Goal: Ability to maintain clinical measurements within normal limits will improve Outcome: Progressing Goal: Cardiovascular complication will be avoided Outcome: Progressing   

## 2021-08-01 NOTE — Social Work (Signed)
CSW acknowledges consult for SNF/HH. The patient will require PT/OT evaluations. TOC will assist with disposition planning once the evaluations have been completed.  °  °TOC will continue to follow.    °

## 2021-08-01 NOTE — Progress Notes (Signed)
°  Echocardiogram 2D Echocardiogram has been performed.  Dana [name] F 08/01/2021, 4:50 PM

## 2021-08-01 NOTE — H&P (Signed)
Cardiology Admission History and Physical:   Patient ID: Dana [name] MRN: [id number]; DOB: 1940-08-29   Admission date: 07/31/2021  PCP:  [name], MD   [facility] Providers Cardiologist:  Rozann Lesches, MD       Chief Complaint:  Chest Pain  Patient Profile:   Dana [name] is a 81 y.o. female with past medical history of CAD (s/p DES to [place] in 02/2017), HOCM, HFpEF, HTN, HLD, RA and anemia who is being seen 08/01/2021 for the evaluation of chest pain and bradycardia at the request of Dr. Sedonia Small.  History of Present Illness:   Ms. Tino most recently had a telehealth visit with Dr. Domenic Polite in 12/2020 and denied any recent chest pain or dyspnea on exertion. She was taking Lasix 2-3 times per week with a reported weight of 179 lbs. Was continued on Toprol-XL 65m daily and Verapamil 2421mdaily.   [facility] evaluation of chest pain for the past 3 hours with associated shortness of breath. [facility] over the weekend, [facility]    Initial labs show WBC 4.2, Hgb 11.5, platelets 199, Na+ 138, K+ 3.9 and creatinine 0.86. BNP 406. Initial Hs Troponin 73 with repeat of 70 which is below prior values in 2021. Negative for COVID and Influenza. Initial EKG showed a junctional rhythm, HR 60 with LVH and associated repol abnormalities along the inferior and lateral leads which are similar to prior tracings. CXR shows chronic bronchitis with no acute abnormalities. CTA showed no evidence of a PE but was noted to have cardiac enlargement and 2 mm nodules in the lungs with follow-up CT recommended in 12 months.   In talking with the patient and her son who is at the bedside, he does interpret during the encounter as an interpreter for the Samoan language is currently unavailable via [employer]. He reports that she had overall been doing well and stays active  around the home by cooking and doing laundry but developed chest pain and shortness of breath last night.  Reports a tightness in her chest at that time and had trouble taking a deep breath. No recent orthopnea, PND or pitting edema. The scales at their home are currently broken but report her weight would previously fluctuate between 180 - 189 lbs. Most of her daily medications are in a pill pack except for Lasix and she tells her son that she takes half a tablet 1-2 times weekly.   Past Medical History:  Diagnosis Date   CAD (coronary artery disease)    a. s/p DES to OM1 in 02/2017   Essential hypertension    Gout    HOCM (hypertrophic obstructive cardiomyopathy) (HCC)    Hyperlipidemia    Rheumatoid arthritis ([facility]    Past Surgical History:  Procedure Laterality Date   CHOLECYSTECTOMY N/A 08/17/2018   Procedure: LAPAROSCOPIC CHOLECYSTECTOMY;  Surgeon: JeAviva SignsMD;  Location: [facility];  Service: General;  Laterality: N/A;   CORONARY BALLOON ANGIOPLASTY N/A 03/03/2017   Procedure: CORONARY BALLOON ANGIOPLASTY;  Surgeon: HaLeonie ManMD;  [facility];  Service: Cardiovascular;  Laterality: N/A;   CORONARY STENT INTERVENTION N/A 03/03/2017   Procedure: CORONARY STENT INTERVENTION;  Surgeon: HaLeonie ManMD;  [facility];  Service: Cardiovascular;  Laterality: N/A;   LEFT HEART CATH AND CORONARY ANGIOGRAPHY N/A 03/02/2017  Procedure: LEFT HEART CATH AND CORONARY ANGIOGRAPHY;  Surgeon: Sherren Mocha, MD;  Location: [place] [facility];  Service: Cardiovascular;  Laterality: N/A;     Medications Prior to Admission: Prior to Admission medications   Medication Sig Start Date End Date Taking? Authorizing Provider  acetaminophen (TYLENOL) 325 MG tablet Take 2 tablets (650 mg total) by mouth every 6 (six) hours as needed for mild pain, fever or headache (or Fever >/= 101). 08/07/19   Roxan Hockey, MD  alendronate (FOSAMAX) 70 MG tablet Take 70 mg by  mouth once a week. 03/15/19   [provider]  ALLERGY RELIEF 10 MG tablet Take 10 mg by mouth daily. 07/26/19   [provider]  allopurinol (ZYLOPRIM) 300 MG tablet Take 300 mg by mouth daily.    [provider]  aspirin EC 81 MG tablet Take 1 tablet (81 mg total) by mouth daily with breakfast. 08/07/19   Denton Brick, Courage, MD  atorvastatin (LIPITOR) 10 MG tablet Take 10 mg by mouth daily.    [provider]  Blood Pressure KIT by Does not apply route. 07/06/11   [provider]  clopidogrel (PLAVIX) 75 MG tablet     [provider]  ezetimibe (ZETIA) 10 MG tablet Take 10 mg by mouth daily.    [provider]  famotidine (PEPCID) 20 MG tablet Take 1 tablet (20 mg total) by mouth 2 (two) times daily. 01/24/18   Carmin Muskrat, MD  ferrous sulfate 325 (65 FE) MG tablet Take 325 mg by mouth daily with breakfast.    [provider]  folic acid (FOLVITE) 1 MG tablet Take 1 mg by mouth daily. 07/26/19   [provider]  furosemide (LASIX) 20 MG tablet Take 20 mg by mouth daily.    [provider]  methotrexate 2.5 MG tablet Take 20 mg by mouth every Wednesday.     [provider]  metoprolol tartrate (LOPRESSOR) 100 MG tablet     [provider]  nitroGLYCERIN (NITROSTAT) 0.4 MG SL tablet PLACE 1 TABLET UNDER THE TONGUE EVERY 5 MINUTES AS NEEDED FOR CHEST PAIN 03/05/21   Satira Sark, MD  pantoprazole (PROTONIX) 40 MG tablet Take 1 tablet (40 mg total) by mouth daily. 08/07/19   Roxan Hockey, MD  promethazine-codeine (PHENERGAN WITH CODEINE) 6.25-10 MG/5ML syrup Take by mouth. 12/30/20   [provider]  sucralfate (CARAFATE) 1 g tablet Take 1 g by mouth 2 (two) times daily. 08/01/19   [provider]  verapamil (CALAN-SR) 240 MG CR tablet Take 1 tablet (240 mg total) by mouth daily. 08/19/19   Cathrine Krizan, Fransisco Hertz, PA-C  Vitamin D, Cholecalciferol, 25 MCG (1000 UT) TABS Take 1  tablet by mouth daily. 12/31/20   [provider]     Allergies:   No Known Allergies  Social History:   Social History   Socioeconomic History   Marital status: Widowed    Spouse name: Not on file   Number of children: Not on file   Years of education: Not on file   Highest education level: Not on file  Occupational History   Not on file  Tobacco Use   Smoking status: Never   Smokeless tobacco: Never  Vaping Use   Vaping Use: Never used  Substance and Sexual Activity   Alcohol use: No   Drug use: No   Sexual activity: Not on file  Other Topics Concern   Not on file  Social History Narrative  Not on file   Social Determinants of Health   Financial Resource Strain: Not on file  Food Insecurity: Not on file  Transportation Needs: Not on file  Physical Activity: Not on file  Stress: Not on file  Social Connections: Not on file  Intimate Partner Violence: Not on file    Family History:   The patient's family history includes Alcohol abuse in her father; Hypertension in her mother; Liver disease in her mother.    ROS:  Please see the history of present illness.  All other ROS reviewed and negative.     Physical Exam/Data:   Vitals:   08/01/21 0300 08/01/21 0330 08/01/21 0536 08/01/21 0800  BP: 114/66 120/63 (!) 144/75   Pulse: (!) 59 (!) 58 62 64  Resp: (!) 26 (!) 27 (!) 25   Temp:      TempSrc:      SpO2: 94% 95% 96% 97%  Weight:      Height:       No intake or output data in the 24 hours ending 08/01/21 0908 Last 3 Weights 07/31/2021 07/27/2021 01/20/2021  Weight (lbs) 185 lb 180 lb 1.6 oz 179 lb  Weight (kg) 83.915 kg 81.693 kg 81.194 kg     Body mass index is 34.96 kg/m.  General: Pleasant female appearing in no acute distress HEENT: normal Neck: no JVD Vascular: No carotid bruits; Distal pulses 2+ bilaterally   Cardiac:  normal S1, S2; RRR; 3/6 systolic murmur along precordium but best appreciated along RUSB.  Lungs:  clear to auscultation  bilaterally, no wheezing, rhonchi or rales  Abd: soft, nontender, no hepatomegaly  Ext: trace ankle edema bilaterally.  Musculoskeletal:  No deformities, BUE and BLE strength normal and equal Skin: warm and dry  Neuro:  CNs 2-12 intact, no focal abnormalities noted Psych:  Normal affect    EKG:  The ECG that was done was personally reviewed and demonstrates junctional rhythm, HR 60 with LVH and associated repol abnormalities along the inferior and lateral leads which are similar to prior tracings.   Relevant CV Studies:  LHC: 02/2017 Ost LM lesion, 40 %stenosed. 1st Mrg lesion, 90 %stenosed. A STENT SIERRA 3.00 X 12 MM drug eluting stent was successfully placed. Post intervention, there is a 0% residual stenosis. Lat 1st Mrg lesion, 60 %stenosed. Bifurcation lesion jailed by the initial stent Post intervention - rescue PTCA only, there is a 20% residual stenosis.   Successful bifurcation PCI of large OM1 with major branch of OM1 PTCA. A Xience Bonanza DES stent was used for the main branch and then PTCA was performed through the stent into the side branch for ostial stenosis.   Plan: Transfer to [facility]) for sheath removal Continue dual antiplatelet therapy for one year (would be okay to stop aspirin after 3 months if necessary) Anticipate that she should be ready for discharge tomorrow   Echocardiogram: 07/2019 IMPRESSIONS     1. There is severe basal septal hypertophy. Combined with a hyperdynamic  LV this creates SAM of the anterior mitral valve leaflet and a dynamic  subvalvular gradient. Peak gradient 110mHg with Valsalva.. Left  ventricular ejection fraction, by  estimation, is 65 to 70%. The left ventricle has hyperdynamic function.  The left ventrical has no regional wall motion abnormalities. There is  Severe basal septal hypertophy left ventricular hypertrophy. Left  ventricular diastolic parameters are  consistent with Grade II diastolic  dysfunction (pseudonormalization).  Elevated left atrial pressure.   2. Right  ventricular systolic function is normal. The right ventricular  size is normal. There is mildly elevated pulmonary artery systolic  pressure.   3. Left atrial size was severely dilated. No left atrial/left atrial  appendage thrombus was detected.   4. Right atrial size was mildly dilated.   5. The mitral valve is normal in structure and function. Mild mitral  valve regurgitation. No evidence of mitral stenosis.   6. The aortic valve has an indeterminant number of cusps. Aortic valve  regurgitation is not visualized. Difficult to assess aortic valve given  subvalvular gradient. Moprhologically looks to be mild aortic stenosis.   7. The inferior vena cava is normal in size with greater than 50%  respiratory variability, suggesting right atrial pressure of 3 mmHg.   Laboratory Data:  High Sensitivity Troponin:   Recent Labs  Lab 07/31/21 2247 08/01/21 0104 08/01/21 0707  TROPONINIHS 73* 70* 78*      Chemistry Recent Labs  Lab 07/31/21 2247  NA 138  K 3.9  CL 106  CO2 24  GLUCOSE 136*  BUN 16  CREATININE 0.86  CALCIUM 8.5*  GFRNONAA >60  ANIONGAP 8    No results for input(s): PROT, ALBUMIN, AST, ALT, ALKPHOS, BILITOT in the last 168 hours. Lipids No results for input(s): CHOL, TRIG, HDL, LABVLDL, LDLCALC, CHOLHDL in the last 168 hours. Hematology Recent Labs  Lab 07/27/21 0959 07/31/21 2247  WBC 3.4* 4.2  RBC 3.62* 3.54*  HGB 12.3 11.5*  HCT 35.9* 35.2*  MCV 99.2 99.4  MCH 34.0 32.5  MCHC 34.3 32.7  RDW 14.4 14.6  PLT 190 199   Thyroid No results for input(s): TSH, FREET4 in the last 168 hours. BNP Recent Labs  Lab 07/31/21 2247  BNP 406.0*    DDimer No results for input(s): DDIMER in the last 168 hours.   Radiology/Studies:  CT Angio Chest Pulmonary Embolism (PE) W or WO Contrast  Result Date: 08/01/2021 CLINICAL DATA:  Pulmonary embolus suspected with high probability. Mid  chest pain starting 3 hours ago. Shortness of breath. EXAM: CT ANGIOGRAPHY CHEST WITH CONTRAST TECHNIQUE: Multidetector CT imaging of the chest was performed using the standard protocol during bolus administration of intravenous contrast. Multiplanar CT image reconstructions and MIPs were obtained to evaluate the vascular anatomy. RADIATION DOSE REDUCTION: This exam was performed according to the departmental dose-optimization program which includes automated exposure control, adjustment of the mA and/or kV according to patient size and/or use of iterative reconstruction technique. CONTRAST:  54m OMNIPAQUE IOHEXOL 350 MG/ML SOLN COMPARISON:  01/29/2017 FINDINGS: Cardiovascular: Motion artifact limits the examination. There is good opacification of the central and segmental pulmonary arteries. No focal filling defects. No evidence of significant pulmonary embolus. Cardiac enlargement. No pericardial effusions. Reflux of contrast material into the hepatic veins suggest right heart failure. Normal caliber thoracic aorta with scattered calcification. No aneurysm. Great vessel origins are patent. 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 No change since prior study. Long-term stability is consistent with benign etiology. No significant lymphadenopathy. Esophagus is decompressed. Lungs/Pleura: Motion artifact limits examination. Atelectasis in the lung bases. No focal consolidation. Scattered nodules in the upper lungs, largest measuring 2 mm in diameter. No pleural effusions. No pneumothorax. Airways are patent. Upper Abdomen: No acute abnormalities demonstrated. Musculoskeletal: Degenerative changes in the spine. Review of the MIP images confirms the above findings. IMPRESSION: 1. No evidence of significant pulmonary embolus. 2. 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 3. No evidence of  active pulmonary disease. 4. 2 mm nodules in the  lungs. No follow-up needed if patient is low-risk (and has no known or suspected primary neoplasm). Non-contrast chest CT can be considered in 12 months if patient is high-risk. This recommendation follows the consensus statement: Guidelines for Management of Incidental Pulmonary Nodules Detected on CT Images: From the Fleischner Society 2017; Radiology 2017; 284:228-243. 5. 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 Stability for greater than 5 years implies benignity; no biopsy or followup indicated (ref: J Am Coll Radiol. 2015 Feb;12(2): 143-50). Electronically Signed   By: Lucienne Capers M.D.   On: 08/01/2021 00:44   DG Chest Port 1 View  Result Date: 07/31/2021 CLINICAL DATA:  Chest pain and shortness of breath starting 3 hours ago. EXAM: PORTABLE CHEST 1 VIEW COMPARISON:  01/11/2021 FINDINGS: Heart size and pulmonary vascularity are normal. Central interstitial changes with peribronchial thickening suggesting chronic bronchitis. No airspace disease or consolidation. No pleural effusions. No pneumothorax. Mediastinal contours appear intact. Calcification of the aorta. Degenerative changes in the spine and shoulders. IMPRESSION: Chronic bronchitic changes in the lungs. No evidence of active pulmonary disease. Electronically Signed   By: Lucienne Capers M.D.   On: 07/31/2021 23:37     Assessment and Plan:   1. Acute HFpEF/HOCM - She has known HOCM and has been admitted in the past with CHF exacerbations but also with dehydration. Her son reports she only takes 58m of Lasix 1-2 times weekly and only weighs herself a few times a month and has not been doing so recently due to the scales no longer working. Says her weight varies by up to 10 lbs and it appears her weight was at 179 lbs in 12/2020 and at 185 lbs on admission.  - BNP at 406 on admission and repeat echocardiogram is pending. Will order IV Lasix [id number] today and reassess tomorrow as need to be cautious  with diuresis given HOCM. We reviewed the importance of following daily weights and I will consult Case Management to see if they can assist her with obtaining a scale. Her son mentions she routinely only takes the pills in her pill pack provided by [facility] so if wanting her to take PO Lasix 2-3 times weekly at discharge, would need to send there so this can be adjusted and help with compliance.  - She was on Toprol-XL 2560maily and Verapamil 240m68mily prior to admission with both currently held. Will review with MD and likely only restart one medication at this time given her junctional rhythm on admission.   2. Junctional Bradycardia - Was noted to be in junctional bradycardia upon admission but telemetry now shows NSR with first-degree AV block and heart rate is in the 60's to 70's. She was on Toprol-XL 25 mg daily and Verapamil 240 mg daily. Both are held for now but will review with MD in regards to restarting one agent and monitoring on telemetry for recurrence.   3. CAD - She is s/p DES to OM1 in 02/2017 and Hs Troponin values have been flat at 73 and 70 this admission.  Not consistent with ACS and likely secondary to known HOCM.  - She denies any recurrent pain at this time. Repeat echocardiogram is pending for reassessment of structural abnormalities. - Continue ASA 81 mg daily, Zetia 10 mg daily and Atorvastatin 10 mg daily. BB held due to bradycardia.   4. Aortic Stenosis - She had mild AS by echocardiogram in 07/2019.  Repeat echo pending.   5. HTN - She was initially hypotensive in the ED but BP has improved, at 133/80 on most recent check. She was on Verapamil and Toprol-XL prior to admission.  6. HLD - She was previously intolerant to high intensity statin therapy and has been on Atorvastatin 10 mg daily and Zetia 10 mg daily. Will recheck FLP with AM labs.   7. RA - She is followed by Rheumatology as an outpatient and takes Methotrexate 17m once weekly. Scheduled to be given  on Wednesday but doubt she will be admitted at that time.   8. Anemia - Being followed by Hematology as an outpatient and felt to be most consistent with iron-deficiency anemia. Hgb at 11.5 this AM and no reports of active bleeding. Will continue PTA Iron and Folic Acid supplements.   9. Lung Nodules - CT this admission shows 2 mm lung nodules with follow-up CT recommended in 12 months.    Risk Assessment/Risk Scores:    HEAR Score (for undifferentiated chest pain):  HEAR Score: 6  New York Heart Association (NYHA) Functional Class NYHA Class II   For questions or updates, please contact [facility] Please consult [email or web address] for contact info under     Signed, BErma Heritage PA-C  08/01/2021 9:08 AM

## 2021-08-01 NOTE — ED Notes (Signed)
[facility] [place] [id number]

## 2021-08-02 DIAGNOSIS — I2510 Atherosclerotic heart disease of native coronary artery without angina pectoris: Secondary | ICD-10-CM | POA: Diagnosis not present

## 2021-08-02 DIAGNOSIS — I421 Obstructive hypertrophic cardiomyopathy: Secondary | ICD-10-CM

## 2021-08-02 DIAGNOSIS — R079 Chest pain, unspecified: Secondary | ICD-10-CM | POA: Diagnosis not present

## 2021-08-02 LAB — BASIC METABOLIC PANEL
Anion gap: 10 (ref 5–15)
BUN: 19 mg/dL (ref 8–23)
CO2: 28 mmol/L (ref 22–32)
Calcium: 8.3 mg/dL — ABNORMAL LOW (ref 8.9–10.3)
Chloride: 102 mmol/L (ref 98–111)
Creatinine, Ser: 0.83 mg/dL (ref 0.44–1.00)
GFR, Estimated: >60 mL/min (ref >60)
Glucose, Bld: 95 mg/dL (ref 70–99)
Potassium: 3.8 mmol/L (ref 3.5–5.1)
Sodium: 140 mmol/L (ref 135–145)

## 2021-08-02 LAB — LIPID PANEL
Cholesterol: 115 mg/dL (ref 0–200)
HDL: 46 mg/dL (ref >40)
LDL Cholesterol: 52 mg/dL (ref 0–99)
Total CHOL/HDL Ratio: 2.5 RATIO
Triglycerides: 87 mg/dL (ref <150)
VLDL: 17 mg/dL (ref 0–40)

## 2021-08-02 MED ORDER — VERAPAMIL HCL ER 240 MG PO TBCR
240.0000 mg | EXTENDED_RELEASE_TABLET | Freq: Every day | ORAL | Status: DC
  Administered 2021-08-02 – 2021-08-03 (×2): 240 mg via ORAL
  Filled 2021-08-02 (×2): qty 1

## 2021-08-02 NOTE — Plan of Care (Signed)
  Problem: Activity: Goal: Risk for activity intolerance will decrease Outcome: Progressing   

## 2021-08-02 NOTE — Progress Notes (Signed)
Mobility Specialist Criteria Algorithm Info.   08/02/21 1139  Mobility  Activity Ambulated with assistance in hallway;Ambulated independently to bathroom (in chair before and after ambulation)  Range of Motion/Exercises Active;All extremities  Level of Assistance Independent  Assistive Device None  Distance Ambulated (ft) 480 ft  Activity Response Tolerated well   Patient ambulated in hallway independently with steady gait. Tolerated ambulation well without complaint or incident. Was in chair with all needs met.   08/02/2021 12:33 PM  [place] Jerriyah Louis, [place], [place]  PUGGP:[phone] Office: [phone]

## 2021-08-02 NOTE — Care Management (Signed)
°  Transition of Care [facility]) Screening Note   Patient Details  Name: Dana [name] Date of Birth: April 29, 1941   Transition of Care [facility]) CM/SW Contact:    Bethena Roys, RN Phone Number: 08/02/2021, 1:22 PM    Transition of Care Department [facility]) has reviewed the patient. Case Manager received a consult for a scale. Patient is not being followed by the Heart Failure Team; therefore, Case Manager will not be able to assist with providing scales. Case Manager called the son and the son will be able to purchase one for home. PTA patient was from home with the support of her son and he takes her to all appointments. No further needs identified at this time.

## 2021-08-02 NOTE — Progress Notes (Signed)
Progress Note  Patient Name: Dana [name] Date of Encounter: 08/02/2021  [facility] Cardiologist: Rozann Lesches, MD    Subjective   81 year old female with a history of coronary artery disease, status post DES to [place] in 2018, HOCM, chronic diastolic congestive heart failure, hypertension, hyperlipidemia, anemia who was admitted on February 5 with chest pain and bradycardia.  Inpatient Medications    Scheduled Meds:  allopurinol  300 mg Oral Daily   aspirin EC  81 mg Oral Q breakfast   atorvastatin  10 mg Oral Daily   ezetimibe  10 mg Oral Daily   famotidine  20 mg Oral BID   ferrous sulfate  325 mg Oral Q breakfast   folic acid  1 mg Oral Daily   [START ON 08/04/2021] methotrexate  20 mg Oral Q Wed   pantoprazole  40 mg Oral Daily   sodium chloride flush  3 mL Intravenous Q12H   sucralfate  1 g Oral BID   Continuous Infusions:  sodium chloride     PRN Meds: sodium chloride, acetaminophen, ondansetron (ZOFRAN) IV, sodium chloride, sodium chloride flush   Vital Signs    Vitals:   08/01/21 1706 08/01/21 1800 08/01/21 1900 08/01/21 2000  BP: (!) 150/76     Pulse: 72 72 66 77  Resp: 18     Temp: 98 F (36.7 C)     TempSrc: Oral     SpO2: 97% 96% 95% 93%  Weight:      Height:        Intake/Output Summary (Last 24 hours) at 08/02/2021 1019 Last data filed at 08/02/2021 [id number] Gross per 24 hour  Intake 3 ml  Output 1850 ml  Net -1847 ml   Last 3 Weights 08/01/2021 07/31/2021 07/27/2021  Weight (lbs) 182 lb 6.4 oz 185 lb 180 lb 1.6 oz  Weight (kg) 82.736 kg 83.915 kg 81.693 kg      Telemetry    NSR at 75 - Personally Reviewed  ECG     - Personally Reviewed  Physical Exam   GEN: No acute distress.   Neck: No JVD Cardiac: RRR, 3/6 systolic murmur,  reduced radial pulses.  Respiratory: Clear to auscultation bilaterally. GI: Soft, nontender, non-distended  MS: No edema; No deformity. Neuro:  Nonfocal  Psych: Normal affect   Labs    High Sensitivity  Troponin:   Recent Labs  Lab 07/31/21 2247 08/01/21 0104 08/01/21 0707  TROPONINIHS 73* 70* 78*     Chemistry Recent Labs  Lab 07/31/21 2247 08/02/21 0646  NA 138 140  K 3.9 3.8  CL 106 102  CO2 24 28  GLUCOSE 136* 95  BUN 16 19  CREATININE 0.86 0.83  CALCIUM 8.5* 8.3*  GFRNONAA >60 >60  ANIONGAP 8 10    Lipids  Recent Labs  Lab 08/02/21 0646  CHOL 115  TRIG 87  HDL 46  LDLCALC 52  CHOLHDL 2.5    Hematology Recent Labs  Lab 07/27/21 0959 07/31/21 2247  WBC 3.4* 4.2  RBC 3.62* 3.54*  HGB 12.3 11.5*  HCT 35.9* 35.2*  MCV 99.2 99.4  MCH 34.0 32.5  MCHC 34.3 32.7  RDW 14.4 14.6  PLT 190 199   Thyroid No results for input(s): TSH, FREET4 in the last 168 hours.  BNP Recent Labs  Lab 07/31/21 2247  BNP 406.0*    DDimer No results for input(s): DDIMER in the last 168 hours.   Radiology    CT Angio Chest Pulmonary Embolism (PE) W  or WO Contrast  Result Date: 08/01/2021 CLINICAL DATA:  Pulmonary embolus suspected with high probability. Mid chest pain starting 3 hours ago. Shortness of breath. EXAM: CT ANGIOGRAPHY CHEST WITH CONTRAST TECHNIQUE: Multidetector CT imaging of the chest was performed using the standard protocol during bolus administration of intravenous contrast. Multiplanar CT image reconstructions and MIPs were obtained to evaluate the vascular anatomy. RADIATION DOSE REDUCTION: This exam was performed according to the departmental dose-optimization program which includes automated exposure control, adjustment of the mA and/or kV according to patient size and/or use of iterative reconstruction technique. CONTRAST:  23mL OMNIPAQUE IOHEXOL 350 MG/ML SOLN COMPARISON:  01/29/2017 FINDINGS: Cardiovascular: Motion artifact limits the examination. There is good opacification of the central and segmental pulmonary arteries. No focal filling defects. No evidence of significant pulmonary embolus. Cardiac enlargement. No pericardial effusions. Reflux of contrast  material into the hepatic veins suggest right heart failure. Normal caliber thoracic aorta with scattered calcification. No aneurysm. Great vessel origins are patent. 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 No change since prior study. Long-term stability is consistent with benign etiology. No significant lymphadenopathy. Esophagus is decompressed. Lungs/Pleura: Motion artifact limits examination. Atelectasis in the lung bases. No focal consolidation. Scattered nodules in the upper lungs, largest measuring 2 mm in diameter. No pleural effusions. No pneumothorax. Airways are patent. Upper Abdomen: No acute abnormalities demonstrated. Musculoskeletal: Degenerative changes in the spine. Review of the MIP images confirms the above findings. IMPRESSION: 1. No evidence of significant pulmonary embolus. 2. 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 3. No evidence of active pulmonary disease. 4. 2 mm nodules in the lungs. No follow-up needed if patient is low-risk (and has no known or suspected primary neoplasm). Non-contrast chest CT can be considered in 12 months if patient is high-risk. This recommendation follows the consensus statement: Guidelines for Management of Incidental Pulmonary Nodules Detected on CT Images: From the Fleischner Society 2017; Radiology 2017; 284:228-243. 5. 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 Stability for greater than 5 years implies benignity; no biopsy or followup indicated (ref: J Am Coll Radiol. 2015 Feb;12(2): 143-50). Electronically Signed   By: Lucienne Capers M.D.   On: 08/01/2021 00:44   DG Chest Port 1 View  Result Date: 07/31/2021 CLINICAL DATA:  Chest pain and shortness of breath starting 3 hours ago. EXAM: PORTABLE CHEST 1 VIEW COMPARISON:  01/11/2021 FINDINGS: Heart size and pulmonary vascularity are normal. Central interstitial changes with peribronchial  thickening suggesting chronic bronchitis. No airspace disease or consolidation. No pleural effusions. No pneumothorax. Mediastinal contours appear intact. Calcification of the aorta. Degenerative changes in the spine and shoulders. IMPRESSION: Chronic bronchitic changes in the lungs. No evidence of active pulmonary disease. Electronically Signed   By: Lucienne Capers M.D.   On: 07/31/2021 23:37   ECHOCARDIOGRAM COMPLETE  Result Date: 08/01/2021    ECHOCARDIOGRAM REPORT   Patient Name:   SYRIAH BALLENGEE Date of Exam: 08/01/2021 Medical Rec #:  [id number]    Height:       61.0 in Accession #:    [id number]   Weight:       185.0 lb Date of Birth:  1940-12-08    BSA:          1.827 m Patient Age:    29 years     BP:           128/66 mmHg Patient Gender: F  HR:           65 bpm. 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 CHF, CAD; Risk Factors:Hypertension,                 Dyslipidemia and Non-Smoker.  Sonographer:    Merrie Roof RDCS Referring Phys: [id number] [place]  1. Dynamic LVOT gradient ~ 100 mmhg. Left ventricular ejection fraction, by estimation, is 65 to 70%. The left ventricle has normal function. The left ventricle has no regional wall motion abnormalities. There is severe asymmetric left ventricular hypertrophy of the basal segment. Left ventricular diastolic parameters are consistent with Grade I diastolic dysfunction (impaired relaxation).  2. Right ventricular systolic function is normal. The right ventricular size is normal. There is normal pulmonary artery systolic pressure.  3. Mild mitral valve regurgitation.  4. Aortic valve regurgitation is not visualized. Aortic valve  sclerosis/calcification is present, without any evidence of aortic stenosis.  5. The inferior vena cava is normal in size with greater than 50% respiratory variability, suggesting right atrial pressure of 3 mmHg. Comparison(s): No significant change from prior study. Conclusion(s)/Recommendation(s): Findings consistent with hypertrophic obstructive cardiomyopathy. FINDINGS  Left Ventricle: Dynamic LVOT gradient ~ 100 mmhg. Left ventricular ejection fraction, by estimation, is 65 to 70%. The left ventricle has normal function. The left ventricle has no regional wall motion abnormalities. Definity contrast agent was given IV  to delineate the left ventricular endocardial borders. The left ventricular internal cavity size was normal in size. There is severe asymmetric left ventricular hypertrophy of the basal segment. Left ventricular diastolic parameters are consistent with Grade I diastolic dysfunction (impaired relaxation). Right Ventricle: The right ventricular size is normal. No increase in right ventricular wall thickness. Right ventricular systolic function is normal. There is normal pulmonary artery systolic pressure. The tricuspid regurgitant velocity is 2.62 m/s, and  with an assumed right atrial pressure of 3 mmHg, the estimated right ventricular systolic pressure is [id number] mmHg. Left Atrium: Left atrial size was normal in size. Right Atrium: Right atrial size was normal in size. Pericardium: There is no evidence of pericardial effusion. Mitral Valve: There is mild calcification of the mitral valve leaflet(s). Mild mitral valve regurgitation. Tricuspid Valve: The tricuspid valve is grossly normal. Tricuspid valve regurgitation is not demonstrated. Aortic Valve: Aortic valve regurgitation is not visualized. Aortic valve sclerosis/calcification is present, without any evidence of aortic stenosis. Aortic valve mean gradient measures 23.0 mmHg. Aortic valve peak gradient measures 43.0 mmHg. Pulmonic Valve: The  pulmonic valve was not well visualized. Pulmonic valve regurgitation is not visualized. Aorta: The aortic root and ascending aorta are structurally normal, with no evidence of dilitation. Venous: The inferior vena cava is normal in size with greater than 50% respiratory variability, suggesting right atrial pressure of 3 mmHg. IAS/Shunts: No atrial level shunt detected by color flow Doppler.  LEFT VENTRICLE PLAX 2D LVIDd:         4.20 cm   Diastology LVIDs:         2.60 cm  LV e' medial:    3.48 cm/s LV PW:         1.20 cm   LV E/e' medial:  29.9 LV IVS:        1.80 cm   LV e' lateral:   6.85 cm/s LVOT diam:     1.80 cm   LV E/e' lateral: 15.2 LVOT Area:     2.54 cm  RIGHT VENTRICLE RV Basal diam:  3.10 cm LEFT ATRIUM             Index        RIGHT ATRIUM           Index LA diam:        3.80 cm 2.08 cm/m   RA Area:     15.80 cm LA Vol (A2C):   60.9 ml 33.33 ml/m  RA Volume:   35.60 ml  19.48 ml/m LA Vol (A4C):   48.0 ml 26.27 ml/m LA Biplane Vol: 54.8 ml 29.99 ml/m  AORTIC VALVE AV Vmax:      328.00 cm/s AV Vmean:     230.000 cm/s AV VTI:       0.743 m AV Peak Grad: 43.0 mmHg AV Mean Grad: 23.0 mmHg  AORTA Ao Root diam: 3.00 cm Ao Asc diam:  3.40 cm MITRAL VALVE                TRICUSPID VALVE MV Area (PHT): 3.31 cm     TR Peak grad:   27.5 mmHg MV Decel Time: 229 msec     TR Vmax:        262.00 cm/s MV E velocity: 104.00 cm/s MV A velocity: 103.00 cm/s  SHUNTS MV E/A ratio:  1.01         Systemic Diam: 1.80 cm Phineas Inches Electronically signed by Phineas Inches Signature Date/Time: 08/01/2021/5:15:45 PM    Final     Cardiac Studies      Patient Profile     81 y.o. female  with HOCM. Admitted with junctional brady ( due to metoprol and verapamil)   Assessment & Plan       Junctional brady:  better after stopping her verapamil and metoprolol.   Will restart verapamil and see how she does.   Ideally, she would benefit from a trial of Mavacamten but this is not available in the hospital .  Will have her  see Dr. Gasper Sells in the office for further consultation of her HOCM Will keep for another day or so to get her stabilized.  2. CAD :  no angina   For questions or updates, please contact [facility] Please consult [email or web address] for contact info under        Signed, Mertie Moores, MD  08/02/2021, 10:19 AM

## 2021-08-03 DIAGNOSIS — I5033 Acute on chronic diastolic (congestive) heart failure: Secondary | ICD-10-CM

## 2021-08-03 DIAGNOSIS — R001 Bradycardia, unspecified: Secondary | ICD-10-CM

## 2021-08-03 DIAGNOSIS — E782 Mixed hyperlipidemia: Secondary | ICD-10-CM

## 2021-08-03 DIAGNOSIS — R079 Chest pain, unspecified: Secondary | ICD-10-CM | POA: Diagnosis not present

## 2021-08-03 DIAGNOSIS — I421 Obstructive hypertrophic cardiomyopathy: Secondary | ICD-10-CM | POA: Diagnosis not present

## 2021-08-03 LAB — BASIC METABOLIC PANEL
Anion gap: 8 (ref 5–15)
BUN: 17 mg/dL (ref 8–23)
CO2: 27 mmol/L (ref 22–32)
Calcium: 8.9 mg/dL (ref 8.9–10.3)
Chloride: 106 mmol/L (ref 98–111)
Creatinine, Ser: 0.99 mg/dL (ref 0.44–1.00)
GFR, Estimated: 58 mL/min — ABNORMAL LOW (ref >60)
Glucose, Bld: 100 mg/dL — ABNORMAL HIGH (ref 70–99)
Potassium: 4.3 mmol/L (ref 3.5–5.1)
Sodium: 141 mmol/L (ref 135–145)

## 2021-08-03 LAB — GLUCOSE, CAPILLARY: Glucose-Capillary: 97 mg/dL (ref 70–99)

## 2021-08-03 NOTE — Plan of Care (Signed)

## 2021-08-03 NOTE — Discharge Summary (Addendum)
Discharge Summary    Patient ID: Nykia Bellman MRN: [id number]; DOB: 1941/04/08  Admit date: 07/31/2021 Discharge date: 08/03/2021  PCP:  [name], MD   [facility] Providers Cardiologist:  Rozann Lesches, MD   {  Discharge Diagnoses    Principal Problem:   Junctional bradycardia Active Problems:   Acute on chronic diastolic CHF (congestive heart failure) (HCC)   Chest pain   Hypertrophic cardiomyopathy ([place])   MR (mitral regurgitation)   HTN (hypertension)   Hyperlipidemia   CAD (coronary artery disease)   Diagnostic Studies/Procedures     Echo 08/01/2021  1. Dynamic LVOT gradient ~ 100 mmhg. Left ventricular ejection fraction,  by estimation, is 65 to 70%. The left ventricle has normal function. The  left ventricle has no regional wall motion abnormalities. There is severe  asymmetric left ventricular  hypertrophy of the basal segment. Left ventricular diastolic parameters  are consistent with Grade I diastolic dysfunction (impaired relaxation).   2. Right ventricular systolic function is normal. The right ventricular  size is normal. There is normal pulmonary artery systolic pressure.   3. Mild mitral valve regurgitation.   4. Aortic valve regurgitation is not visualized. Aortic valve  sclerosis/calcification is present, without any evidence of aortic  stenosis.   5. The inferior vena cava is normal in size with greater than 50%  respiratory variability, suggesting right atrial pressure of 3 mmHg.   Comparison(s): No significant change from prior study.   Conclusion(s)/Recommendation(s): Findings consistent with hypertrophic  obstructive cardiomyopathy.    History of Present Illness     Lyndzie Guidetti is a 81 y.o. female with  past medical history of CAD (s/p DES to OM1 in 02/2017), HOCM, HFpEF, HTN, HLD, RA and anemia (followed by hematology) who admitted for evaluation of chest pain and bradycardia.   She presented to [facility] ED on 07/31/2021 for evaluation  of chest pain for the past 3 hours with associated shortness of breath. No recent orthopnea, PND or pitting edema. No daily weigh due to broken scale. Was going to be admitted to [facility] but given no Cardiology coverage over the weekend, she was transferred to [facility] for further evaluation.     Initial labs show WBC 4.2, Hgb 11.5, platelets 199, Na+ 138, K+ 3.9 and creatinine 0.86. BNP 406. Initial Hs Troponin 73 with repeat of 70 which is below prior values in 2021. Negative for COVID and Influenza. Initial EKG showed a junctional rhythm, HR 60 with LVH and associated repol abnormalities along the inferior and lateral leads which are similar to prior tracings. CXR shows chronic bronchitis with no acute abnormalities. CTA showed no evidence of a PE but was noted to have cardiac enlargement and 2 mm nodules in the lungs with follow-up CT recommended in 12 months.   She has known HOCM and has been admitted in the past with CHF exacerbations but also with dehydration. Her son reports she only takes 10mg  of Lasix 1-2 times weekly and only weighs herself a few times a month and has not been doing so recently due to the scales no longer working. Says her weight varies by up to 10 lbs and it appears her weight was at 179 lbs in 12/2020 and at 185 lbs on admission.   Admitted for CHF and bradycardia.   Hospital Course     Consultants: None   1. Acute HFpEF/HOCM - BNP at 406 on admission. She was given  IV Lasix 40mg  x1  (cautious with diuresis given  HOCM). Net I & O negative 1.8L. Weight 185 on admit to 178.9lb at discharge.  - She was on Toprol-XL 25mg  daily and Verapamil 240mg  daily prior to admission with both currently held given her junctional rhythm on admission. She felt much better with improved heart rate. Started Verapamil alone with stable rate for past 24 hours. Per Dr. Acie Fredrickson "She will need follow up with Dr. Gasper Sells in the office to consider Mavacamten therapy". - Echo showed LVEF  of 65-70%. No regional WM abnormality. Dynamic LVOT gradient ~ 100 mmhg.   2. Junctional Bradycardia - Was noted to be in junctional bradycardia upon admission. She was on Toprol-XL 25 mg daily and Verapamil 240 mg daily. Both are held with improved HR (85-105 with mobility with PT) and symptoms. Started Verapamil alone with stable rate for past 24 hours.  3. Chest pain with hx of CAD s/p DES to OM1 in 02/2017  [facility] Troponin values have been flat at 73 and 70 this admission.  Not consistent with ACS and likely secondary to known HOCM.  No recurrent pain.  - Continue ASA 81 mg daily, Zetia 10 mg daily and Atorvastatin 10 mg daily. BB held due to bradycardia.   The patient been seen by Dr. Acie Fredrickson today and deemed ready for discharge home. All follow-up appointments have been scheduled. Discharge medications are listed below.  No change in home medications except discontinuation of Toprol XL.   Did the patient have an acute coronary syndrome (MI, NSTEMI, STEMI, etc) this admission?:  No                               Did the patient have a percutaneous coronary intervention (stent / angioplasty)?:  No.    Discharge Vitals Blood pressure 122/70, pulse (!) 105, temperature 97.9 F (36.6 C), temperature source Oral, resp. rate 15, height 5\' 1"  (1.549 m), weight 81.1 kg, SpO2 99 %.  Filed Weights   07/31/21 2214 08/01/21 0647 08/03/21 0435  Weight: 83.9 kg 82.7 kg 81.1 kg    Labs & Radiologic Studies    CBC Recent Labs    07/31/21 2247  WBC 4.2  HGB 11.5*  HCT 35.2*  MCV 99.4  PLT 123XX123   Basic Metabolic Panel Recent Labs    08/02/21 0646 08/03/21 0413  NA 140 141  K 3.8 4.3  CL 102 106  CO2 28 27  GLUCOSE 95 100*  BUN 19 17  CREATININE 0.83 0.99  CALCIUM 8.3* 8.9   High Sensitivity Troponin:   Recent Labs  Lab 07/31/21 2247 08/01/21 0104 08/01/21 0707  TROPONINIHS 73* 70* 78*   Fasting Lipid Panel Recent Labs    08/02/21 0646  CHOL 115  HDL 46  LDLCALC 52  TRIG  87  CHOLHDL 2.5   _____________  CT Angio Chest Pulmonary Embolism (PE) W or WO Contrast  Result Date: 08/01/2021 CLINICAL DATA:  Pulmonary embolus suspected with high probability. Mid chest pain starting 3 hours ago. Shortness of breath. EXAM: CT ANGIOGRAPHY CHEST WITH CONTRAST TECHNIQUE: Multidetector CT imaging of the chest was performed using the standard protocol during bolus administration of intravenous contrast. Multiplanar CT image reconstructions and MIPs were obtained to evaluate the vascular anatomy. RADIATION DOSE REDUCTION: This exam was performed according to the departmental dose-optimization program which includes automated exposure control, adjustment of the mA and/or kV according to patient size and/or use of iterative reconstruction technique. CONTRAST:  77mL OMNIPAQUE  IOHEXOL 350 MG/ML SOLN COMPARISON:  01/29/2017 FINDINGS: Cardiovascular: Motion artifact limits the examination. There is good opacification of the central and segmental pulmonary arteries. No focal filling defects. No evidence of significant pulmonary embolus. Cardiac enlargement. No pericardial effusions. Reflux of contrast material into the hepatic veins suggest right heart failure. Normal caliber thoracic aorta with scattered calcification. No aneurysm. Great vessel origins are patent. 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 No change since prior study. Long-term stability is consistent with benign etiology. No significant lymphadenopathy. Esophagus is decompressed. Lungs/Pleura: Motion artifact limits examination. Atelectasis in the lung bases. No focal consolidation. Scattered nodules in the upper lungs, largest measuring 2 mm in diameter. No pleural effusions. No pneumothorax. Airways are patent. Upper Abdomen: No acute abnormalities demonstrated. Musculoskeletal: Degenerative changes in the spine. Review of the MIP images confirms the above findings.  IMPRESSION: 1. No evidence of significant pulmonary embolus. 2. 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 3. No evidence of active pulmonary disease. 4. 2 mm nodules in the lungs. No follow-up needed if patient is low-risk (and has no known or suspected primary neoplasm). Non-contrast chest CT can be considered in 12 months if patient is high-risk. This recommendation follows the consensus statement: Guidelines for Management of Incidental Pulmonary Nodules Detected on CT Images: From the Fleischner Society 2017; Radiology 2017; 284:228-243. 5. 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 Stability for greater than 5 years implies benignity; no biopsy or followup indicated (ref: J Am Coll Radiol. 2015 Feb;12(2): 143-50). Electronically Signed   By: Lucienne Capers M.D.   On: 08/01/2021 00:44   DG Chest Port 1 View  Result Date: 07/31/2021 CLINICAL DATA:  Chest pain and shortness of breath starting 3 hours ago. EXAM: PORTABLE CHEST 1 VIEW COMPARISON:  01/11/2021 FINDINGS: Heart size and pulmonary vascularity are normal. Central interstitial changes with peribronchial thickening suggesting chronic bronchitis. No airspace disease or consolidation. No pleural effusions. No pneumothorax. Mediastinal contours appear intact. Calcification of the aorta. Degenerative changes in the spine and shoulders. IMPRESSION: Chronic bronchitic changes in the lungs. No evidence of active pulmonary disease. Electronically Signed   By: Lucienne Capers M.D.   On: 07/31/2021 23:37   ECHOCARDIOGRAM COMPLETE  Result Date: 08/01/2021    ECHOCARDIOGRAM REPORT   Patient Name:   ELEESHA CRICHTON Date of Exam: 08/01/2021 Medical Rec #:  [id number]    Height:       61.0 in Accession #:    [id number]   Weight:       185.0 lb Date of Birth:  December 06, 1940    BSA:          1.827 m Patient Age:    81 years     BP:           128/66 mmHg Patient Gender: F            HR:           65 bpm. Exam Location:   Inpatient Procedure: 2D Echo, Cardiac Doppler, Color Doppler and Intracardiac            Opacification Agent Indications:    Hypertrophic obstructive cardiomyopathy I42.1                 Chest Pain R07.9  History:        Patient has prior history of Echocardiogram examinations, most  recent 08/07/2019. CHF, CAD; Risk Factors:Hypertension,                 Dyslipidemia and Non-Smoker.  Sonographer:    Merrie Roof RDCS Referring Phys: [id number] [place]  1. Dynamic LVOT gradient ~ 100 mmhg. Left ventricular ejection fraction, by estimation, is 65 to 70%. The left ventricle has normal function. The left ventricle has no regional wall motion abnormalities. There is severe asymmetric left ventricular hypertrophy of the basal segment. Left ventricular diastolic parameters are consistent with Grade I diastolic dysfunction (impaired relaxation).  2. Right ventricular systolic function is normal. The right ventricular size is normal. There is normal pulmonary artery systolic pressure.  3. Mild mitral valve regurgitation.  4. Aortic valve regurgitation is not visualized. Aortic valve sclerosis/calcification is present, without any evidence of aortic stenosis.  5. The inferior vena cava is normal in size with greater than 50% respiratory variability, suggesting right atrial pressure of 3 mmHg. Comparison(s): No significant change from prior study. Conclusion(s)/Recommendation(s): Findings consistent with hypertrophic obstructive cardiomyopathy. FINDINGS  Left Ventricle: Dynamic LVOT gradient ~ 100 mmhg. Left ventricular ejection fraction, by estimation, is 65 to 70%. The left ventricle has normal function. The left ventricle has no regional wall motion abnormalities. Definity contrast agent was given IV  to delineate the left ventricular endocardial borders. The left ventricular internal cavity size was normal in size. There is severe asymmetric left ventricular hypertrophy of the basal  segment. Left ventricular diastolic parameters are consistent with Grade I diastolic dysfunction (impaired relaxation). Right Ventricle: The right ventricular size is normal. No increase in right ventricular wall thickness. Right ventricular systolic function is normal. There is normal pulmonary artery systolic pressure. The tricuspid regurgitant velocity is 2.62 m/s, and  with an assumed right atrial pressure of 3 mmHg, the estimated right ventricular systolic pressure is [id number] mmHg. Left Atrium: Left atrial size was normal in size. Right Atrium: Right atrial size was normal in size. Pericardium: There is no evidence of pericardial effusion. Mitral Valve: There is mild calcification of the mitral valve leaflet(s). Mild mitral valve regurgitation. Tricuspid Valve: The tricuspid valve is grossly normal. Tricuspid valve regurgitation is not demonstrated. Aortic Valve: Aortic valve regurgitation is not visualized. Aortic valve sclerosis/calcification is present, without any evidence of aortic stenosis. Aortic valve mean gradient measures 23.0 mmHg. Aortic valve peak gradient measures 43.0 mmHg. Pulmonic Valve: The pulmonic valve was not well visualized. Pulmonic valve regurgitation is not visualized. Aorta: The aortic root and ascending aorta are structurally normal, with no evidence of dilitation. Venous: The inferior vena cava is normal in size with greater than 50% respiratory variability, suggesting right atrial pressure of 3 mmHg. IAS/Shunts: No atrial level shunt detected by color flow Doppler.  LEFT VENTRICLE PLAX 2D LVIDd:         4.20 cm   Diastology LVIDs:         2.60 cm   LV e' medial:    3.48 cm/s LV PW:         1.20 cm   LV E/e' medial:  29.9 LV IVS:        1.80 cm   LV e' lateral:   6.85 cm/s LVOT diam:     1.80 cm   LV E/e' lateral: 15.2 LVOT Area:     2.54 cm  RIGHT VENTRICLE RV Basal diam:  3.10 cm LEFT ATRIUM             Index  RIGHT ATRIUM           Index LA diam:        3.80 cm 2.08 cm/m    RA Area:     15.80 cm LA Vol (A2C):   60.9 ml 33.33 ml/m  RA Volume:   35.60 ml  19.48 ml/m LA Vol (A4C):   48.0 ml 26.27 ml/m LA Biplane Vol: 54.8 ml 29.99 ml/m  AORTIC VALVE AV Vmax:      328.00 cm/s AV Vmean:     230.000 cm/s AV VTI:       0.743 m AV Peak Grad: 43.0 mmHg AV Mean Grad: 23.0 mmHg  AORTA Ao Root diam: 3.00 cm Ao Asc diam:  3.40 cm MITRAL VALVE                TRICUSPID VALVE MV Area (PHT): 3.31 cm     TR Peak grad:   27.5 mmHg MV Decel Time: 229 msec     TR Vmax:        262.00 cm/s MV E velocity: 104.00 cm/s MV A velocity: 103.00 cm/s  SHUNTS MV E/A ratio:  1.01         Systemic Diam: 1.80 cm Phineas Inches Electronically signed by Phineas Inches Signature Date/Time: 08/01/2021/5:15:45 PM    Final    Disposition   Pt is being discharged home today in good condition.  Follow-up Plans & Appointments     Follow-up Information     Werner Lean, MD Follow up on 08/13/2021.   Specialty: Cardiology Why: @1 :30pm for HOCM consultation. Please arrive 15 minutes early Contact information: [address] [place] [id number] [phone]                Discharge Instructions     Diet - low sodium heart healthy   Complete by: As directed    Increase activity slowly   Complete by: As directed        Discharge Medications   Allergies as of 08/03/2021   No Known Allergies      Medication List     STOP taking these medications    metoprolol succinate 25 MG 24 hr tablet Commonly known as: TOPROL-XL       TAKE these medications    acetaminophen 325 MG tablet Commonly known as: TYLENOL Take 2 tablets (650 mg total) by mouth every 6 (six) hours as needed for mild pain, fever or headache (or Fever >/= 101).   alendronate 70 MG tablet Commonly known as: FOSAMAX Take 70 mg by mouth every Wednesday.   Allergy Relief 10 MG tablet Generic drug: loratadine Take 10 mg by mouth daily.   allopurinol 300 MG tablet Commonly known as: ZYLOPRIM Take  300 mg by mouth daily.   aspirin EC 81 MG tablet Take 1 tablet (81 mg total) by mouth daily with breakfast.   atorvastatin 10 MG tablet Commonly known as: LIPITOR Take 10 mg by mouth daily.   ezetimibe 10 MG tablet Commonly known as: ZETIA Take 10 mg by mouth daily.   famotidine 20 MG tablet Commonly known as: PEPCID Take 1 tablet (20 mg total) by mouth 2 (two) times daily.   ferrous sulfate 325 (65 FE) MG tablet Take 325 mg by mouth daily with breakfast.   folic acid 1 MG tablet Commonly known as: FOLVITE Take 1 mg by mouth daily.   furosemide 20 MG tablet Commonly known as: LASIX Take 20 mg by mouth daily as needed  for fluid or edema.   methotrexate 2.5 MG tablet Take 20 mg by mouth every Wednesday.   nitroGLYCERIN 0.4 MG SL tablet Commonly known as: NITROSTAT PLACE 1 TABLET UNDER THE TONGUE EVERY 5 MINUTES AS NEEDED FOR CHEST PAIN What changed: See the new instructions.   pantoprazole 40 MG tablet Commonly known as: PROTONIX Take 1 tablet (40 mg total) by mouth daily.   sucralfate 1 g tablet Commonly known as: CARAFATE Take 1 g by mouth 2 (two) times daily.   verapamil 240 MG CR tablet Commonly known as: CALAN-SR Take 1 tablet (240 mg total) by mouth daily.   Vitamin D (Cholecalciferol) 25 MCG (1000 UT) Tabs Take 1 tablet by mouth daily.        Outstanding Labs/Studies   None  Duration of Discharge Encounter   Greater than 30 minutes including physician time.  Mahalia [place], PA 08/03/2021, 12:45 PM    Attending Note:   The patient was seen and examined.  Agree with assessment and plan as noted above.  Changes made to the above note as needed.  Patient seen and independently examined with Robbie Lis, PA .   We discussed all aspects of the encounter. I agree with the assessment and plan as stated above.     Junctional brady:  better after stopping her verapamil and metoprolol.   Will restart verapamil and see how she does.    Ideally, she would benefit from a trial of Mavacamten but this is not available in the hospital .  Will have her see Dr. Gasper Sells in the office for further consultation of her HOCM   Her HR is better on verapamil alone. She will need follow up with Dr. Gasper Sells in the office to consider [place] therapy    2. CAD :  no angina .      3.  Hyperlipidemia:  cont atorvastatin 10 mg a day      She is stable for Dc   I have spent a total of 40 minutes with patient reviewing hospital  notes , telemetry, EKGs, labs and examining patient as well as establishing an assessment and plan that was discussed with the patient.  > 50% of time was spent in direct patient care.    Thayer Headings, Brooke Bonito., MD, [facility] 08/11/2021, 2:38 PM [id number] N. [address]  [place] Pager [phone]

## 2021-08-03 NOTE — Progress Notes (Signed)
OT Cancellation Note  Patient Details Name: Dana [name] MRN: [id number] DOB: March 23, 1941   Cancelled Treatment:    Reason Eval/Treat Not Completed: OT screened, no needs identified, will sign off (Discussed with PT. Pt independent.)  Thornell Mule, OT/L   Acute OT Clinical Specialist Acute Rehabilitation Services Pager [phone] Office [phone]  08/03/2021, 11:52 AM

## 2021-08-03 NOTE — Progress Notes (Addendum)
Progress Note  Patient Name: Dana [name] Date of Encounter: 08/03/2021  [facility] Cardiologist: Nona Dell, MD    Subjective   81 year old female with a history of coronary artery disease, status post DES to OM1 in 2018, HOCM, chronic diastolic congestive heart failure, hypertension, hyperlipidemia, anemia who was admitted on February 5 with chest pain and bradycardia.  Inpatient Medications    Scheduled Meds:  allopurinol  300 mg Oral Daily   aspirin EC  81 mg Oral Q breakfast   atorvastatin  10 mg Oral Daily   ezetimibe  10 mg Oral Daily   famotidine  20 mg Oral BID   ferrous sulfate  325 mg Oral Q breakfast   folic acid  1 mg Oral Daily   [START ON 08/04/2021] methotrexate  20 mg Oral Q Wed   pantoprazole  40 mg Oral Daily   sodium chloride flush  3 mL Intravenous Q12H   sucralfate  1 g Oral BID   verapamil  240 mg Oral Daily   Continuous Infusions:  sodium chloride     PRN Meds: sodium chloride, acetaminophen, ondansetron (ZOFRAN) IV, sodium chloride, sodium chloride flush   Vital Signs    Vitals:   08/02/21 1120 08/02/21 2016 08/03/21 0435 08/03/21 0923  BP: 122/60 (!) 94/53 135/76 122/70  Pulse: 81 64 83   Resp:   15   Temp:  98.5 F (36.9 C) 97.9 F (36.6 C)   TempSrc:  Oral Oral   SpO2: 94% 97% 99%   Weight:   81.1 kg   Height:        Intake/Output Summary (Last 24 hours) at 08/03/2021 0950 Last data filed at 08/03/2021 1610 Gross per 24 hour  Intake 3 ml  Output --  Net 3 ml    Last 3 Weights 08/03/2021 08/01/2021 07/31/2021  Weight (lbs) 178 lb 14.4 oz 182 lb 6.4 oz 185 lb  Weight (kg) 81.149 kg 82.736 kg 83.915 kg      Telemetry    NSR at 75 - Personally Reviewed  ECG     - Personally Reviewed  Physical Exam   GEN: No acute distress.   Neck: No JVD Cardiac: RRR, 3/6 systolic murmur,  reduced radial pulses.  Respiratory: Clear to auscultation bilaterally. GI: Soft, nontender, non-distended  MS: No edema; No deformity. Neuro:   Nonfocal  Psych: Normal affect   Labs    High Sensitivity Troponin:   Recent Labs  Lab 07/31/21 2247 08/01/21 0104 08/01/21 0707  TROPONINIHS 73* 70* 78*      Chemistry Recent Labs  Lab 07/31/21 2247 08/02/21 0646 08/03/21 0413  NA 138 140 141  K 3.9 3.8 4.3  CL 106 102 106  CO2 GLUCOSE 136* 95 100*  BUN CREATININE 0.86 0.83 0.99  CALCIUM 8.5* 8.3* 8.9  GFRNONAA >60 >60 58*  ANIONGAP Lipids  Recent Labs  Lab 08/02/21 0646  CHOL 115  TRIG 87  HDL 46  LDLCALC 52  CHOLHDL 2.5     Hematology Recent Labs  Lab 07/27/21 0959 07/31/21 2247  WBC 3.4* 4.2  RBC 3.62* 3.54*  HGB 12.3 11.5*  HCT 35.9* 35.2*  MCV 99.2 99.4  MCH 34.0 32.5  MCHC 34.3 32.7  RDW 14.4 14.6  PLT 190 199    Thyroid No results for input(s): TSH, FREET4 in the last 168 hours.  BNP Recent Labs  Lab 07/31/21 2247  BNP  406.0*     DDimer No results for input(s): DDIMER in the last 168 hours.   Radiology    ECHOCARDIOGRAM COMPLETE  Result Date: 08/01/2021    ECHOCARDIOGRAM REPORT   Patient Name:   Dana [name] Date of Exam: 08/01/2021 Medical Rec #:  [id number]    Height:       61.0 in Accession #:    [id number]   Weight:       185.0 lb Date of Birth:  08/13/1940    BSA:          1.827 m Patient Age:    80 years     BP:           128/66 mmHg Patient Gender: F            HR:           65 bpm. 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 CHF, CAD; Risk Factors:Hypertension,                 Dyslipidemia and Non-Smoker.  Sonographer:    Roosvelt Maserachel Lane RDCS Referring Phys: [id number] Ellsworth LennoxBRITTANY M STRADER IMPRESSIONS  1. Dynamic LVOT gradient ~ 100 mmhg. Left ventricular ejection fraction, by  estimation, is 65 to 70%. The left ventricle has normal function. The left ventricle has no regional wall motion abnormalities. There is severe asymmetric left ventricular hypertrophy of the basal segment. Left ventricular diastolic parameters are consistent with Grade I diastolic dysfunction (impaired relaxation).  2. Right ventricular systolic function is normal. The right ventricular size is normal. There is normal pulmonary artery systolic pressure.  3. Mild mitral valve regurgitation.  4. Aortic valve regurgitation is not visualized. Aortic valve sclerosis/calcification is present, without any evidence of aortic stenosis.  5. The inferior vena cava is normal in size with greater than 50% respiratory variability, suggesting right atrial pressure of 3 mmHg. Comparison(s): No significant change from prior study. Conclusion(s)/Recommendation(s): Findings consistent with hypertrophic obstructive cardiomyopathy. FINDINGS  Left Ventricle: Dynamic LVOT gradient ~ 100 mmhg. Left ventricular ejection fraction, by estimation, is 65 to 70%. The left ventricle has normal function. The left ventricle has no regional wall motion abnormalities. Definity contrast agent was given IV  to delineate the left ventricular endocardial borders. The left ventricular internal cavity size was normal in size. There is severe asymmetric left ventricular hypertrophy of the basal segment. Left ventricular diastolic parameters are consistent with Grade I diastolic dysfunction (impaired relaxation). Right Ventricle: The right ventricular size is normal. No increase in right ventricular wall thickness. Right ventricular systolic function is normal. There is normal pulmonary artery systolic pressure. The tricuspid regurgitant velocity is 2.62 m/s, and  with an assumed right atrial pressure of 3 mmHg, the estimated right ventricular systolic pressure is 30.5 mmHg. Left Atrium: Left atrial size was normal in size. Right Atrium: Right atrial size  was normal in size. Pericardium: There is no evidence of pericardial effusion. Mitral Valve: There is mild calcification of the mitral valve leaflet(s). Mild mitral valve regurgitation. Tricuspid Valve: The  tricuspid valve is grossly normal. Tricuspid valve regurgitation is not demonstrated. Aortic Valve: Aortic valve regurgitation is not visualized. Aortic valve sclerosis/calcification is present, without any evidence of aortic stenosis. Aortic valve mean gradient measures 23.0 mmHg. Aortic valve peak gradient measures 43.0 mmHg. Pulmonic Valve: The pulmonic valve was not well visualized. Pulmonic valve regurgitation is not visualized. Aorta: The aortic root and ascending aorta are structurally normal, with no evidence of dilitation. Venous: The inferior vena cava is normal in size with greater than 50% respiratory variability, suggesting right atrial pressure of 3 mmHg. IAS/Shunts: No atrial level shunt detected by color flow Doppler.  LEFT VENTRICLE PLAX 2D LVIDd:         4.20 cm   Diastology LVIDs:         2.60 cm   LV e' medial:    3.48 cm/s LV PW:         1.20 cm   LV E/e' medial:  29.9 LV IVS:        1.80 cm   LV e' lateral:   6.85 cm/s LVOT diam:     1.80 cm   LV E/e' lateral: 15.2 LVOT Area:     2.54 cm  RIGHT VENTRICLE RV Basal diam:  3.10 cm LEFT ATRIUM             Index        RIGHT ATRIUM           Index LA diam:        3.80 cm 2.08 cm/m   RA Area:     15.80 cm LA Vol (A2C):   60.9 ml 33.33 ml/m  RA Volume:   35.60 ml  19.48 ml/m LA Vol (A4C):   48.0 ml 26.27 ml/m LA Biplane Vol: 54.8 ml 29.99 ml/m  AORTIC VALVE AV Vmax:      328.00 cm/s AV Vmean:     230.000 cm/s AV VTI:       0.743 m AV Peak Grad: 43.0 mmHg AV Mean Grad: 23.0 mmHg  AORTA Ao Root diam: 3.00 cm Ao Asc diam:  3.40 cm MITRAL VALVE                TRICUSPID VALVE MV Area (PHT): 3.31 cm     TR Peak grad:   27.5 mmHg MV Decel Time: 229 msec     TR Vmax:        262.00 cm/s MV E velocity: 104.00 cm/s MV A velocity: 103.00 cm/s  SHUNTS  MV E/A ratio:  1.01         Systemic Diam: 1.80 cm Carolan Clines Electronically signed by Carolan Clines Signature Date/Time: 08/01/2021/5:15:45 PM    Final     Cardiac Studies      Patient Profile     81 y.o. female  with HOCM. Admitted with junctional brady ( due to metoprol and verapamil)   Assessment & Plan       Junctional brady:  better after stopping her verapamil and metoprolol.   Will restart verapamil and see how she does.   Ideally, she would benefit from a trial of Mavacamten but this is not available in the hospital .  Will have her see Dr. Izora Ribas in the office for further consultation of her HOCM  Her HR is better on verapamil alone. She will need follow up with Dr. Izora Ribas in the office to consider Mavacamten therapy   2. CAD :  no angina .     3.  Hyperlipidemia:  cont atorvastatin 10 mg a day    She is stable for Dc    For questions or updates, please contact [facility] Please consult [email or web address] for contact info under        Signed, Kristeen Miss, MD  08/03/2021, 9:50 AM

## 2021-08-03 NOTE — Progress Notes (Signed)
Heart Failure Nurse Navigator Progress Note  Assessed for HV TOC readiness. Patient has close cardiology follow up with Dr. Shari Prows on 2/21. If unable to keep appointment, will consider HV TOC clinic.   Ozella Rocks, MSN, RN Heart Failure Nurse Navigator [phone]

## 2021-08-03 NOTE — Evaluation (Signed)
Physical Therapy Evaluation/ discharge Patient Details Name: Dana [name] MRN: [id number] DOB: 1941/04/18 Today's Date: 08/03/2021  History of Present Illness  81 yo admitted 2/5 with chest pain and bradycardia with medication adjusted acutely. PMHx: CAD, dCHF, HTN, HLD, anemia  Clinical Impression  Pt very pleasant sitting up in chair on arrival. Pt lives with son, cares for herself and cooks, cleans and keeps a garden. Pt loves being with her family and reports living in [place] for 10 years prior to [facility]. Pt with HR 85-105 throughout mobility and demonstrates independence with all basic transfers and gait without further therapy needs. Pt safe for D/C.        Recommendations for follow up therapy are one component of a multi-disciplinary discharge planning process, led by the attending physician.  Recommendations may be updated based on patient status, additional functional criteria and insurance authorization.  Follow Up Recommendations No PT follow up    Assistance Recommended at Discharge None  Patient can return home with the following       Equipment Recommendations None recommended by PT  Recommendations for Other Services       Functional Status Assessment Patient has not had a recent decline in their functional status     Precautions / Restrictions Precautions Precautions: None      Mobility  Bed Mobility Overal bed mobility: Independent                  Transfers Overall transfer level: Independent                      Ambulation/Gait Ambulation/Gait assistance: Independent Gait Distance (Feet): 450 Feet Assistive device: None Gait Pattern/deviations: WFL(Within Functional Limits)   Gait velocity interpretation: >4.37 ft/sec, indicative of normal walking speed      Stairs Stairs: Yes Stairs assistance: Independent Stair Management: Alternating pattern, Forwards Number of Stairs: 2    Wheelchair Mobility    Modified Rankin (Stroke  Patients Only)       Balance Overall balance assessment: Independent                                           Pertinent Vitals/Pain Pain Assessment Pain Assessment: No/denies pain    Home Living Family/patient expects to be discharged to:: Private residence Living Arrangements: Children Available Help at Discharge: Family;Available 24 hours/day Type of Home: House Home Access: Stairs to enter Entrance Stairs-Rails: Doctor, general practice of Steps: 2   Home Layout: One level Home Equipment: None      Prior Function Prior Level of Function : Independent/Modified Independent                     Hand Dominance        Extremity/Trunk Assessment   Upper Extremity Assessment Upper Extremity Assessment: Overall WFL for tasks assessed    Lower Extremity Assessment Lower Extremity Assessment: Overall WFL for tasks assessed    Cervical / Trunk Assessment Cervical / Trunk Assessment: Normal  Communication   Communication: Prefers language other than English (Samoan. Speaks English as 2nd language)  Cognition Arousal/Alertness: Awake/alert Behavior During Therapy: WFL for tasks assessed/performed Overall Cognitive Status: Within Functional Limits for tasks assessed  General Comments      Exercises     Assessment/Plan    PT Assessment Patient does not need any further PT services  PT Problem List         PT Treatment Interventions      PT Goals (Current goals can be found in the Care Plan section)  Acute Rehab PT Goals PT Goal Formulation: All assessment and education complete, DC therapy    Frequency       Co-evaluation               AM-PAC PT "6 Clicks" Mobility  Outcome Measure Help needed turning from your back to your side while in a flat bed without using bedrails?: None Help needed moving from lying on your back to sitting on the side of a flat  bed without using bedrails?: None Help needed moving to and from a bed to a chair (including a wheelchair)?: None Help needed standing up from a chair using your arms (e.g., wheelchair or bedside chair)?: None Help needed to walk in hospital room?: None Help needed climbing 3-5 steps with a railing? : None 6 Click Score: 24    End of Session   Activity Tolerance: Patient tolerated treatment well Patient left: in chair;with call bell/phone within reach Nurse Communication: Mobility status PT Visit Diagnosis: Other abnormalities of gait and mobility (R26.89)    Time: 6811-5726 PT Time Calculation (min) (ACUTE ONLY): 11 min   Charges:   PT Evaluation $PT Eval Low Complexity: 1 Low          Jayesh Marbach P, PT Acute Rehabilitation Services Pager: [phone] Office: [phone]   Enedina Finner Jonty Morrical 08/03/2021, 11:42 AM

## 2021-08-11 NOTE — Progress Notes (Signed)
Cardiology Office Note:    Date:  08/13/2021   ID:  Dana [name], DOB 12-12-1940, MRN [id number]  PCP:  [name], MD   [facility] Providers Cardiologist:  Nona Dell, MD     Referring MD: [name], MD   CC:  Consult HOCM  History of Present Illness:    Dana [name] is a 81 y.o. female with a hx of CAD s/p PCI OM1 and HOCM with preserved LVEF and LVOT gradient of 100.  Interpretor had been deferred in the setting of family.  Patient notes that she is still having some chest pain but it has improved. Notes DOE but no SOB at rest. No syncope or near syncope. No palpitations. Is able to take care of her adult child (son) with Down's Syndrome.   Feels worse after nitroglycerin. Does not know what HCM is. No family testing.. Family gives her the lasix if she ahd 2-3 lbs weight gain. Take lasix 10 mg PO daily.   Past Medical History:  Diagnosis Date   CAD (coronary artery disease)    a. s/p DES to OM1 in 02/2017   Chest pain    Essential hypertension    Gout    HOCM (hypertrophic obstructive cardiomyopathy) (HCC)    Hyperlipidemia    Rheumatoid arthritis (HCC)     Past Surgical History:  Procedure Laterality Date   CHOLECYSTECTOMY N/A 08/17/2018   Procedure: LAPAROSCOPIC CHOLECYSTECTOMY;  Surgeon: [name], MD;  Location: [facility];  Service: General;  Laterality: N/A;   CORONARY BALLOON ANGIOPLASTY N/A 03/03/2017   Procedure: CORONARY BALLOON ANGIOPLASTY;  Surgeon: Marykay Lex, MD;  Location: [facility] INVASIVE CV LAB;  Service: Cardiovascular;  Laterality: N/A;   CORONARY STENT INTERVENTION N/A 03/03/2017   Procedure: CORONARY STENT INTERVENTION;  Surgeon: Marykay Lex, MD;  Location: [facility] INVASIVE CV LAB;  Service: Cardiovascular;  Laterality: N/A;   LEFT HEART CATH AND CORONARY ANGIOGRAPHY N/A 03/02/2017   Procedure: LEFT HEART CATH AND CORONARY ANGIOGRAPHY;  Surgeon: Tonny Bollman, MD;  Location: [facility] INVASIVE CV LAB;  Service: Cardiovascular;  Laterality:  N/A;    Current Medications: Current Meds  Medication Sig   acetaminophen (TYLENOL) 325 MG tablet Take 2 tablets (650 mg total) by mouth every 6 (six) hours as needed for mild pain, fever or headache (or Fever >/= 101).   alendronate (FOSAMAX) 70 MG tablet Take 70 mg by mouth every Wednesday.   ALLERGY RELIEF 10 MG tablet Take 10 mg by mouth daily.   allopurinol (ZYLOPRIM) 300 MG tablet Take 300 mg by mouth daily.   aspirin EC 81 MG tablet Take 1 tablet (81 mg total) by mouth daily with breakfast.   atorvastatin (LIPITOR) 10 MG tablet Take 10 mg by mouth daily.   ezetimibe (ZETIA) 10 MG tablet Take 10 mg by mouth daily.   famotidine (PEPCID) 20 MG tablet Take 1 tablet (20 mg total) by mouth 2 (two) times daily.   ferrous sulfate 325 (65 FE) MG tablet Take 325 mg by mouth daily with breakfast.   folic acid (FOLVITE) 1 MG tablet Take 1 mg by mouth daily.   furosemide (LASIX) 20 MG tablet Take 20 mg by mouth daily as needed for fluid or edema.   methotrexate 2.5 MG tablet Take 20 mg by mouth every Wednesday.    nitroGLYCERIN (NITROSTAT) 0.4 MG SL tablet PLACE 1 TABLET UNDER THE TONGUE EVERY 5 MINUTES AS NEEDED FOR CHEST PAIN   pantoprazole (PROTONIX) 40 MG tablet Take 1 tablet (40 mg  total) by mouth daily.   sucralfate (CARAFATE) 1 g tablet Take 1 g by mouth 2 (two) times daily.   verapamil (VERELAN PM) 360 MG 24 hr capsule Take 1 capsule (360 mg total) by mouth at bedtime.   Vitamin D, Cholecalciferol, 25 MCG (1000 UT) TABS Take 1 tablet by mouth daily.   [DISCONTINUED] verapamil (CALAN-SR) 240 MG CR tablet Take 1 tablet (240 mg total) by mouth daily.     Allergies:   Patient has no known allergies.   Social History   Socioeconomic History   Marital status: Widowed    Spouse name: Not on file   Number of children: Not on file   Years of education: Not on file   Highest education level: Not on file  Occupational History   Not on file  Tobacco Use   Smoking status: Never    Smokeless tobacco: Never  Vaping Use   Vaping Use: Never used  Substance and Sexual Activity   Alcohol use: No   Drug use: No   Sexual activity: Not on file  Other Topics Concern   Not on file  Social History Narrative   Not on file   Social Determinants of Health   Financial Resource Strain: Not on file  Food Insecurity: Not on file  Transportation Needs: Not on file  Physical Activity: Not on file  Stress: Not on file  Social Connections: Not on file     Family History: The patient's family history includes Alcohol abuse in her father; Hypertension in her mother; Liver disease in her mother.  ROS:   Please see the history of present illness.     All other systems reviewed and are negative.  EKGs/Labs/Other Studies Reviewed:    The following studies were reviewed today:   Recent Labs: 09/27/2020: ALT 35 07/31/2021: B Natriuretic Peptide 406.0; Hemoglobin 11.5; Platelets 199 08/03/2021: BUN 17; Creatinine, Ser 0.99; Potassium 4.3; Sodium 141  Recent Lipid Panel    Component Value Date/Time   CHOL 115 08/02/2021 0646   TRIG 87 08/02/2021 0646   HDL 46 08/02/2021 0646   CHOLHDL 2.5 08/02/2021 0646   VLDL 17 08/02/2021 0646   LDLCALC 52 08/02/2021 0646     Physical Exam:    VS:  BP 119/70    Pulse 87    Ht 5\' 2"  (1.575 m)    Wt 80.3 kg    SpO2 99%    BMI 32.37 kg/m     Wt Readings from Last 3 Encounters:  08/13/21 80.3 kg  08/03/21 81.1 kg  07/27/21 81.7 kg     Gen: no distress, elderly female   Neck: No JV Cardiac: No Rubs or Gallops, harsh systolic Murmur worse with hand grip and standing, RRR and +2 radial pulses Respiratory: Clear to auscultation bilaterally, normal effort, normal  respiratory rate GI: Soft, nontender, non-distended  MS: No  edema;  moves all extremities Integument: Skin feels ok Neuro:  At time of evaluation, alert and oriented to person/place/time/situation  Psych: Normal affect, patient feels ok   ASSESSMENT:    1. HOCM  (hypertrophic obstructive cardiomyopathy) (HCC)    PLAN:    Hypertrophic Cardiomyopathy - Septal Variant; peak gradient 100  mm Hg - with associated MR - Family history negative, discussed 1st degree family history screening; she has four sons and daugters, three who live out of the country - NYHA II-III - on 20 mg Lasix and with normal LVEF - resting heart rate is 87s, we have discussed  dual AV nodal therapy, dysopyramide, and mavacamten - will increase to verapamil 360 - will get stress echo after 3/22 (she has a vacation to [place] in March and does not want to miss it as long as her sx are tolerable) - we have started financial checks for mavacamten - based on results will either start mavacamten 5, disopyramide 400 mg TID, or metoprolol based on resuilts - at next visit will get interpretor (Samoan) - will work through [facility] - has had no ectopy or sx; will further discussed SCD risk based on further testing   Time Spent Directly with Patient:   I have spent a total of 40 minutes with the patient reviewing notes, imaging, EKGs, labs and examining the patient as well as establishing an assessment and plan that was discussed personally with the patient.  > 50% of time was spent in direct patient care and famil       Medication Adjustments/Labs and Tests Ordered: Current medicines are reviewed at length with the patient today.  Concerns regarding medicines are outlined above.  Orders Placed This Encounter  Procedures   Cardiac Stress Test: Informed Consent Details: Physician/Practitioner Attestation; Transcribe to consent form and obtain patient signature   ECHOCARDIOGRAM STRESS TEST   Meds ordered this encounter  Medications   verapamil (VERELAN PM) 360 MG 24 hr capsule    Sig: Take 1 capsule (360 mg total) by mouth at bedtime.    Dispense:  90 capsule    Refill:  3    Patient Instructions  Medication Instructions:  Your physician has recommended you make  the following change in your medication:  INCREASE: verapamil to 360 mg by mouth once daily  *If you need a refill on your cardiac medications before your next appointment, please call your pharmacy*   Lab Work: NONE If you have labs (blood work) drawn today and your tests are completely normal, you will receive your results only by: MyChart Message (if you have MyChart) OR A paper copy in the mail If you have any lab test that is abnormal or we need to change your treatment, we will call you to review the results.   Testing/Procedures: Your physician has requested that you have a stress echocardiogram after September 15, 2021. For further information please visit [email or web address] Please follow instruction sheet as given.    Follow-Up: At [facility], you and your health needs are our priority.  As part of our continuing mission to provide you with exceptional heart care, we have created designated Provider Care Teams.  These Care Teams include your primary Cardiologist (physician) and Advanced Practice Providers (APPs -  Physician Assistants and Nurse Practitioners) who all work together to provide you with the care you need, when you need it.  We recommend signing up for the patient portal called "MyChart".  Sign up information is provided on this After Visit Summary.  MyChart is used to connect with patients for Virtual Visits (Telemedicine).  Patients are able to view lab/test results, encounter notes, upcoming appointments, etc.  Non-urgent messages can be sent to your provider as well.   To learn more about what you can do with MyChart, go to [email or web address].    Your next appointment:   2 month(s)  The format for your next appointment:   In Person  Provider:   Riley Lam, MD     Signed, Christell Constant, MD  08/13/2021 2:07 PM    [facility]

## 2021-08-13 ENCOUNTER — Ambulatory Visit (INDEPENDENT_AMBULATORY_CARE_PROVIDER_SITE_OTHER): Payer: Medicare Other | Admitting: Internal Medicine

## 2021-08-13 ENCOUNTER — Encounter: Payer: Self-pay | Admitting: Internal Medicine

## 2021-08-13 ENCOUNTER — Telehealth: Payer: Self-pay | Admitting: Pharmacist

## 2021-08-13 ENCOUNTER — Other Ambulatory Visit: Payer: Self-pay

## 2021-08-13 VITALS — BP 119/70 | HR 87 | Ht 62.0 in | Wt 177.0 lb

## 2021-08-13 DIAGNOSIS — I421 Obstructive hypertrophic cardiomyopathy: Secondary | ICD-10-CM | POA: Diagnosis not present

## 2021-08-13 MED ORDER — VERAPAMIL HCL ER 360 MG PO CP24: 360.0000 mg | ORAL_CAPSULE | Freq: Every day | ORAL | 3 refills | Status: DC

## 2021-08-13 NOTE — Telephone Encounter (Signed)
[employer] portal enrollment form has been faxed. There is no prior authorization form available on [employer] to submit with her Medicaid insurance and when I called to clarify I was transferred then hung up on. Hopefully portal can provide information regarding this.

## 2021-08-13 NOTE — Patient Instructions (Signed)
Medication Instructions:  Your physician has recommended you make the following change in your medication:  INCREASE: verapamil to 360 mg by mouth once daily  *If you need a refill on your cardiac medications before your next appointment, please call your pharmacy*   Lab Work: NONE If you have labs (blood work) drawn today and your tests are completely normal, you will receive your results only by: MyChart Message (if you have MyChart) OR A paper copy in the mail If you have any lab test that is abnormal or we need to change your treatment, we will call you to review the results.   Testing/Procedures: Your physician has requested that you have a stress echocardiogram after September 15, 2021. For further information please visit [email or web address]. Please follow instruction sheet as given.    Follow-Up: At [facility], you and your health needs are our priority.  As part of our continuing mission to provide you with exceptional heart care, we have created designated Provider Care Teams.  These Care Teams include your primary Cardiologist (physician) and Advanced Practice Providers (APPs -  Physician Assistants and Nurse Practitioners) who all work together to provide you with the care you need, when you need it.  We recommend signing up for the patient portal called "MyChart".  Sign up information is provided on this After Visit Summary.  MyChart is used to connect with patients for Virtual Visits (Telemedicine).  Patients are able to view lab/test results, encounter notes, upcoming appointments, etc.  Non-urgent messages can be sent to your provider as well.   To learn more about what you can do with MyChart, go to [email or web address].    Your next appointment:   2 month(s)  The format for your next appointment:   In Person  Provider:   Riley LamMahesh Chandrasekhar, MD

## 2021-08-16 NOTE — Progress Notes (Signed)
Dana [name], [place] [id number]   CLINIC:  Medical Oncology/Hematology  PCP:  Dana [name], [place] [facility] [place] [place] [name] [phone]   REASON FOR VISIT:  Follow-up for evaluation of mild leukopenia  PRIOR THERAPY: None  CURRENT THERAPY: Under work-up  INTERVAL HISTORY:  Dana [name] 81 y.o. female returns for routine follow-up of leukopenia.  She was seen for initial consultation by Dr. Delton Coombes and Tarri Abernethy PA-C on 07/27/2021.  **NOTE:  Patient's primary language is Samoan - she was offered interpreter services via telephone, but she declined and indicated that she would like her son-in-law Dana [name], who is present during visit, to act as interpreter for her.  At today's visit, she reports feeling well.   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  She denies any other changes and reports that she has no complaints today.  Since her last visit, she has not had any infections. She denies any B symptoms such as fever, chills, night sweats, or unexplained weight loss. She has not noticed any new lumps or bumps. She remains on methotrexate 20 mg weekly for her rheumatoid arthritis.  She has 100% energy and 100% appetite. She endorses that she is maintaining a stable weight.   REVIEW OF SYSTEMS:  Review of Systems  Constitutional:  Negative for appetite change, chills, diaphoresis, fatigue, fever and unexpected weight change.  HENT:   Negative for lump/mass and nosebleeds.   Eyes:  Negative for eye problems.  Respiratory:  Negative for cough, hemoptysis and shortness of breath.   Cardiovascular:  Negative for chest pain, leg swelling and palpitations.  Gastrointestinal:  Negative for abdominal pain, blood in stool, constipation, diarrhea, nausea and vomiting.  Genitourinary:  Negative for hematuria.   Skin: Negative.    Neurological:  Negative for dizziness, headaches and light-headedness.  Hematological:  Does not bruise/bleed easily.     PAST MEDICAL/SURGICAL HISTORY:  Past Medical History:  Diagnosis Date   CAD (coronary artery disease)    a. s/p DES to OM1 in 02/2017   Chest pain    Essential hypertension    Gout    HOCM (hypertrophic obstructive cardiomyopathy) ([place])    Hyperlipidemia    Rheumatoid arthritis ([place])    Past Surgical History:  Procedure Laterality Date   CHOLECYSTECTOMY N/A 08/17/2018   Procedure: LAPAROSCOPIC CHOLECYSTECTOMY;  Surgeon: Aviva Signs, MD;  Location: [facility];  Service: General;  Laterality: N/A;   CORONARY BALLOON ANGIOPLASTY N/A 03/03/2017   Procedure: CORONARY BALLOON ANGIOPLASTY;  Surgeon: Leonie Man, MD;  Location: [place] [facility];  Service: Cardiovascular;  Laterality: N/A;   CORONARY STENT INTERVENTION N/A 03/03/2017   Procedure: CORONARY STENT INTERVENTION;  Surgeon: Leonie Man, MD;  Location: [place] [facility];  Service: Cardiovascular;  Laterality: N/A;   LEFT HEART CATH AND CORONARY ANGIOGRAPHY N/A 03/02/2017   Procedure: LEFT HEART CATH AND CORONARY ANGIOGRAPHY;  Surgeon: Sherren Mocha, MD;  Location: [place] [facility];  Service: Cardiovascular;  Laterality: N/A;     SOCIAL HISTORY:  Social History   Socioeconomic History   Marital status: Widowed    Spouse name: Not on file   Number of children: Not on file   Years of education: Not on file   Highest education level: Not on file  Occupational History   Not on file  Tobacco Use   Smoking status: Never  Smokeless tobacco: Never  Vaping Use   Vaping Use: Never used  Substance and Sexual Activity   Alcohol use: No   Drug use: No   Sexual activity: Not on file  Other Topics Concern   Not on file  Social History Narrative   Not on file   Social Determinants of Health   Financial Resource Strain: Not on file  Food Insecurity: Not on file  Transportation Needs: Not on file   Physical Activity: Not on file  Stress: Not on file  Social Connections: Not on file  Intimate Partner Violence: Not on file    FAMILY HISTORY:  Family History  Problem Relation Age of Onset   Hypertension Mother    Liver disease Mother    Alcohol abuse Father     CURRENT MEDICATIONS:  Outpatient Encounter Medications as of 08/17/2021  Medication Sig   acetaminophen (TYLENOL) 325 MG tablet Take 2 tablets (650 mg total) by mouth every 6 (six) hours as needed for mild pain, fever or headache (or Fever >/= 101).   alendronate (FOSAMAX) 70 MG tablet Take 70 mg by mouth every Wednesday.   ALLERGY RELIEF 10 MG tablet Take 10 mg by mouth daily.   allopurinol (ZYLOPRIM) 300 MG tablet Take 300 mg by mouth daily.   aspirin EC 81 MG tablet Take 1 tablet (81 mg total) by mouth daily with breakfast.   atorvastatin (LIPITOR) 10 MG tablet Take 10 mg by mouth daily.   ezetimibe (ZETIA) 10 MG tablet Take 10 mg by mouth daily.   famotidine (PEPCID) 20 MG tablet Take 1 tablet (20 mg total) by mouth 2 (two) times daily.   ferrous sulfate 325 (65 FE) MG tablet Take 325 mg by mouth daily with breakfast.   folic acid (FOLVITE) 1 MG tablet Take 1 mg by mouth daily.   furosemide (LASIX) 20 MG tablet Take 20 mg by mouth daily as needed for fluid or edema.   methotrexate 2.5 MG tablet Take 20 mg by mouth every Wednesday.    nitroGLYCERIN (NITROSTAT) 0.4 MG SL tablet PLACE 1 TABLET UNDER THE TONGUE EVERY 5 MINUTES AS NEEDED FOR CHEST PAIN   pantoprazole (PROTONIX) 40 MG tablet Take 1 tablet (40 mg total) by mouth daily.   sucralfate (CARAFATE) 1 g tablet Take 1 g by mouth 2 (two) times daily.   verapamil (VERELAN PM) 360 MG 24 hr capsule Take 1 capsule (360 mg total) by mouth at bedtime.   Vitamin D, Cholecalciferol, 25 MCG (1000 UT) TABS Take 1 tablet by mouth daily.   No facility-administered encounter medications on file as of 08/17/2021.    ALLERGIES:  No Known Allergies   PHYSICAL EXAM:  ECOG  PERFORMANCE STATUS: 0 - Asymptomatic  There were no vitals filed for this visit. There were no vitals filed for this visit. Physical Exam Constitutional:      Appearance: Normal appearance. She is obese.  HENT:     Head: Normocephalic and atraumatic.     Mouth/Throat:     Mouth: Mucous membranes are moist.  Eyes:     Extraocular Movements: Extraocular movements intact.     Pupils: Pupils are equal, round, and reactive to light.  Cardiovascular:     Rate and Rhythm: Normal rate and regular rhythm.     Pulses: Normal pulses.     Heart sounds: Murmur heard.  Pulmonary:     Effort: Pulmonary effort is normal.     Breath sounds: Normal breath sounds.  Abdominal:  General: Bowel sounds are normal.     Palpations: Abdomen is soft.     Tenderness: There is no abdominal tenderness.  Musculoskeletal:        General: No swelling.     Right lower leg: No edema.     Left lower leg: No edema.  Lymphadenopathy:     Cervical: No cervical adenopathy.  Skin:    General: Skin is warm and dry.  Neurological:     General: No focal deficit present.     Mental Status: She is alert and oriented to person, place, and time.  Psychiatric:        Mood and Affect: Mood normal.        Behavior: Behavior normal.     LABORATORY DATA:  I have reviewed the labs as listed.  CBC    Component Value Date/Time   WBC 4.2 07/31/2021 2247   RBC 3.54 (L) 07/31/2021 2247   HGB 11.5 (L) 07/31/2021 2247   HCT 35.2 (L) 07/31/2021 2247   PLT 199 07/31/2021 2247   MCV 99.4 07/31/2021 2247   MCH 32.5 07/31/2021 2247   MCHC 32.7 07/31/2021 2247   RDW 14.6 07/31/2021 2247   LYMPHSABS 0.9 07/27/2021 0959   MONOABS 0.5 07/27/2021 0959   EOSABS 0.2 07/27/2021 0959   BASOSABS 0.0 07/27/2021 0959   CMP Latest Ref Rng & Units 08/03/2021 08/02/2021 07/31/2021  Glucose 70 - 99 mg/dL 100(H) 95 136(H)  BUN 8 - 23 mg/dL 17 19 16   Creatinine 0.44 - 1.00 mg/dL 0.99 0.83 0.86  Sodium 135 - 145 mmol/L 141 140 138   Potassium 3.5 - 5.1 mmol/L 4.3 3.8 3.9  Chloride 98 - 111 mmol/L 106 102 106  CO2 22 - 32 mmol/L 27 28 24   Calcium 8.9 - 10.3 mg/dL 8.9 8.3(L) 8.5(L)  Total Protein 6.5 - 8.1 g/dL - - -  Total Bilirubin 0.3 - 1.2 mg/dL - - -  Alkaline Phos 38 - 126 U/L - - -  AST 15 - 41 U/L - - -  ALT 0 - 44 U/L - - -    DIAGNOSTIC IMAGING:  I have independently reviewed the relevant imaging and discussed with the patient.  ASSESSMENT & PLAN: 1.  Leukopenia - Here at the request of her primary care provider (NP Blenda Nicely) for the evaluation of leukopenia - Per records sent by PCP, patient was noted to have recent CBC (06/11/2021) with mildly low WBC 3.3 with normal differential, normal Hgb 11.7/MCV 97, normal platelets 238.  Prior labs (03/19/2021) show WBC 3.3 with normal differential.  Before that, patient had normal WBC 4.1 (07/08/2020). - She denies any frequent infections or B symptoms  - No lymphadenopathy or hepatosplenomegaly on exam.   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 Kathlene November in [place]), as well as daily folic acid supplement. - Differential diagnosis includes LGL leukemia, nutritional deficiencies, early MDS, benign ethnic neutropenia, or bone marrow suppression related to methotrexate  - PLAN: Due to presence of large granular lymphocytes on peripheral smear, we will check flow cytometry to rule out any LGL leukemia - We will call patient with results, and if positive we will  schedule her for additional follow-up visit. - Otherwise, repeat labs and RTC  in 4 months. - If she develops any significant neutropenia or other cytopenias in the future, we will speak with Dr. Kathlene November (rheumatology in [place]) and see if we can decrease the dose of her methotrexate.  2.  Iron deficiency anemia - Being managed by primary care provider - Currently taking ferrous sulfate 325 mg twice daily - Most recent labs sent by PCP (03/19/2021): Show ferritin 165, iron saturation 59%, and TIBC 271 - PLAN: Hemoglobin and iron levels seem to be well managed by PCP at this time, without need for IV iron infusions. - Continue to defer management of IDA to primary care provider, but we remain available to assist if needed.   3.  Other history - PMH:  Hypertrophic obstructive cardiomyopathy, hypertension, CAD s/p stents, diastolic congestive heart failure, gouty arthritis, hyperlipidemia, GERD, iron deficiency anemia, and rheumatoid arthritis - SOCIAL: She lives at home.  She worked as a Agricultural engineer.  No tobacco, alcohol, illicit drug use. - FAMILY: No known blood disorders or malignancy in the patient's family.   All questions were answered. The patient knows to call the clinic with any problems, questions or concerns.  Medical decision making: Moderate  Time spent on visit: I spent 20 minutes counseling the patient face to face. The total time spent in the appointment was 30 minutes and more than 50% was on counseling.   Harriett Rush, PA-C  08/17/21 9:28 AM

## 2021-08-17 ENCOUNTER — Inpatient Hospital Stay (HOSPITAL_COMMUNITY): Payer: Medicare Other

## 2021-08-17 ENCOUNTER — Inpatient Hospital Stay (HOSPITAL_COMMUNITY): Payer: Medicare Other | Attending: Physician Assistant | Admitting: Physician Assistant

## 2021-08-17 ENCOUNTER — Other Ambulatory Visit (HOSPITAL_COMMUNITY): Payer: Self-pay | Admitting: *Deleted

## 2021-08-17 ENCOUNTER — Other Ambulatory Visit: Payer: Self-pay

## 2021-08-17 ENCOUNTER — Other Ambulatory Visit (HOSPITAL_COMMUNITY): Payer: Self-pay | Admitting: Physician Assistant

## 2021-08-17 VITALS — BP 136/82 | HR 79 | Temp 97.1°F | Resp 20 | Ht 62.99 in | Wt 180.8 lb

## 2021-08-17 DIAGNOSIS — D509 Iron deficiency anemia, unspecified: Secondary | ICD-10-CM | POA: Insufficient documentation

## 2021-08-17 DIAGNOSIS — I110 Hypertensive heart disease with heart failure: Secondary | ICD-10-CM | POA: Diagnosis not present

## 2021-08-17 DIAGNOSIS — D72819 Decreased white blood cell count, unspecified: Secondary | ICD-10-CM

## 2021-08-17 DIAGNOSIS — R7402 Elevation of levels of lactic acid dehydrogenase (LDH): Secondary | ICD-10-CM | POA: Diagnosis not present

## 2021-08-17 NOTE — Progress Notes (Signed)
Patient's son Dana [name] is translating for her. All questions answered by son.

## 2021-08-17 NOTE — Patient Instructions (Signed)
Dana [name] [facility] at [facility] Discharge Instructions  You were seen today by Rojelio Brenner PA-C for your low white blood cells.  The tests we checked showed some mildly abnormal white blood cells, so we will check one other blood test today.  As long as this test is normal, we can assume that your white blood cells are low due to your methotrexate (arthritis medication).  However, since your white blood cells are only slightly low, it is still okay for you to keep taking the same dose of methotrexate.    LABS:  Check labs to day before leaving the hospital building.  We will call you with results.  We will check labs again in 4 months.  MEDICATIONS: No changes  FOLLOW-UP APPOINTMENT: Office visit in 4 months, after labs   Thank you for choosing [facility] at [facility] to provide your oncology and hematology care.  To afford each patient quality time with our provider, please arrive at least 15 minutes before your scheduled appointment time.   If you have a lab appointment with the Cancer Center please come in thru the Main Entrance and check in at the main information desk.  You need to re-schedule your appointment should you arrive 10 or more minutes late.  We strive to give you quality time with our providers, and arriving late affects you and other patients whose appointments are after yours.  Also, if you no show three or more times for appointments you may be dismissed from the clinic at the providers discretion.     Again, thank you for choosing [facility].  Our hope is that these requests will decrease the amount of time that you wait before being seen by our physicians.       _____________________________________________________________  Should you have questions after your visit to [facility], please contact our office at [phone] and follow the prompts.  Our office hours are 8:00 a.m. and 4:30 p.m.  Monday - Friday.  Please note that voicemails left after 4:00 p.m. may not be returned until the following business day.  We are closed weekends and major holidays.  You do have access to a nurse 24-7, just call the main number to the clinic [phone] and do not press any options, hold on the line and a nurse will answer the phone.    For prescription refill requests, have your pharmacy contact our office and allow 72 hours.    Due to Covid, you will need to wear a mask upon entering the hospital. If you do not have a mask, a mask will be given to you at the Main Entrance upon arrival. For doctor visits, patients may have 1 support person age 68 or older with them. For treatment visits, patients can not have anyone with them due to social distancing guidelines and our immunocompromised population.

## 2021-08-18 LAB — SURGICAL PATHOLOGY

## 2021-08-19 NOTE — Progress Notes (Signed)
Called patient and left message on answering machine pertaining to Flow Cytometry results. Per R. Pennington PA, no concerns noted and to follow up in 4 months as scheduled. Patient requests results to be called to her son Jonny RuizJohn and his number is the same on her patient demographics. Message left on answering machine.

## 2021-08-19 NOTE — Telephone Encounter (Signed)
Form has been refaxed.

## 2021-08-19 NOTE — Telephone Encounter (Signed)
Cover [employer] Med[employer] called and states that the patient's whole application needs to be re-submitted.  Page 2 had an illegible date on the rx. Please fax to [phone]

## 2021-08-26 NOTE — Telephone Encounter (Signed)
Prior authorization for [employer] has been submitted. We only had Medicaid in our system which does not have an available prior auth form, but [employer] also found active [employer] Part D coverage, ID [id number]. Prior Berkley Harvey has been submitted through them. ?

## 2021-08-27 LAB — FLOW CYTOMETRY

## 2021-08-27 NOTE — Telephone Encounter (Signed)
Prior auth approved through 02/26/22. ?

## 2021-08-27 NOTE — Telephone Encounter (Signed)
Dana [name] with [employer] is aware of approval, portal form previously submitted as well. They will follow up with pt with next steps for med shipment/any needed copay support. ?

## 2021-09-06 ENCOUNTER — Telehealth: Payer: Self-pay | Admitting: Pharmacist

## 2021-09-06 NOTE — Telephone Encounter (Signed)
Received message from [facility] that they have been unable to reach pt to proceed with starting [employer]. I also called pt and did not get an answer. Provided portal with her daughter's # to try reaching as well. Pt will need to contact them at [phone] ext [phone]. ?

## 2021-09-16 ENCOUNTER — Inpatient Hospital Stay (HOSPITAL_COMMUNITY): Admission: RE | Admit: 2021-09-16 | Payer: Medicare Other | Source: Ambulatory Visit

## 2021-09-30 ENCOUNTER — Telehealth (HOSPITAL_COMMUNITY): Payer: Self-pay | Admitting: *Deleted

## 2021-09-30 NOTE — Telephone Encounter (Signed)
Detailed message left on son's voicemail, per dpr, with instructions on upcoming appointment. Patient instructed to arrive by 1:30.  Kirstie Peri ? ?

## 2021-10-04 ENCOUNTER — Other Ambulatory Visit (HOSPITAL_COMMUNITY): Payer: Medicare Other

## 2021-10-06 ENCOUNTER — Other Ambulatory Visit: Payer: Self-pay | Admitting: Internal Medicine

## 2021-10-06 ENCOUNTER — Ambulatory Visit (HOSPITAL_COMMUNITY): Payer: Medicare Other

## 2021-10-06 ENCOUNTER — Ambulatory Visit (HOSPITAL_COMMUNITY): Payer: Medicare Other | Attending: Cardiovascular Disease

## 2021-10-06 DIAGNOSIS — I421 Obstructive hypertrophic cardiomyopathy: Secondary | ICD-10-CM | POA: Diagnosis present

## 2021-10-06 LAB — ECHOCARDIOGRAM LIMITED: S' Lateral: 2.2 cm

## 2021-10-26 NOTE — Progress Notes (Signed)
?Cardiology Office Note:   ? ?Date:  10/27/2021  ? ?Dana [name], DOB 1940/11/22, MRN [id number] ? ?PCP:  [name], MD ?  ?[facility] Providers ?Cardiologist:  Rozann Lesches, MD    ? ?Referring MD: [name], MD  ? ?CC:  Follow up HOCM ? ?History of Present Illness:   ? ?Dana [name] is a 81 y.o. female with a hx of CAD s/p PCI OM1 and HOCM with preserved LVEF and LVOT gradient of 100.  She was unable to do stress test due to severe gradient.  She is back from her trip. ? ?Interpretor had been deferred in the setting of family again (son in law). ? ?Patient notes no SOB at rest and no DOE. ?Is able to clean and do ADLs without issue. ?Went to visit old friends [place]. ?Notes no fatigue. ?Notes no palpitations ?Notes no CP. ?Notes no Dizziness. ?Notes no syncope. ? ?She has seven children.  One is deceased from Leukemia.   ? ? ?Past Medical History:  ?Diagnosis Date  ? CAD (coronary artery disease)   ? a. s/p DES to [place] in 02/2017  ? Chest pain   ? Essential hypertension   ? Gout   ? HOCM (hypertrophic obstructive cardiomyopathy) ([place])   ? Hyperlipidemia   ? Rheumatoid arthritis ([place])   ? ? ?Past Surgical History:  ?Procedure Laterality Date  ? CHOLECYSTECTOMY N/A 08/17/2018  ? Procedure: LAPAROSCOPIC CHOLECYSTECTOMY;  Surgeon: Aviva Signs, MD;  Location: [facility];  Service: General;  Laterality: N/A;  ? CORONARY BALLOON ANGIOPLASTY N/A 03/03/2017  ? Procedure: CORONARY BALLOON ANGIOPLASTY;  Surgeon: Leonie Man, MD;  Location: [place];  Service: Cardiovascular;  Laterality: N/A;  ? CORONARY STENT INTERVENTION N/A 03/03/2017  ? Procedure: CORONARY STENT INTERVENTION;  Surgeon: Leonie Man, MD;  Location: [facility];  Service: Cardiovascular;  Laterality: N/A;  ? LEFT HEART CATH AND CORONARY ANGIOGRAPHY N/A 03/02/2017  ? Procedure: LEFT HEART CATH AND CORONARY ANGIOGRAPHY;  Surgeon: Sherren Mocha, MD;  Location: [facility];  Service: Cardiovascular;  Laterality: N/A;   ? ? ?Current Medications: ?Current Meds  ?Medication Sig  ? acetaminophen (TYLENOL) 325 MG tablet Take 2 tablets (650 mg total) by mouth every 6 (six) hours as needed for mild pain, fever or headache (or Fever >/= 101).  ? alendronate (FOSAMAX) 70 MG tablet Take 70 mg by mouth every Wednesday.  ? ALLERGY RELIEF 10 MG tablet Take 10 mg by mouth daily.  ? allopurinol (ZYLOPRIM) 300 MG tablet Take 300 mg by mouth daily.  ? aspirin EC 81 MG tablet Take 1 tablet (81 mg total) by mouth daily with breakfast.  ? atorvastatin (LIPITOR) 10 MG tablet Take 10 mg by mouth daily.  ? ezetimibe (ZETIA) 10 MG tablet Take 10 mg by mouth daily.  ? famotidine (PEPCID) 20 MG tablet Take 1 tablet (20 mg total) by mouth 2 (two) times daily.  ? ferrous sulfate 325 (65 FE) MG tablet Take 325 mg by mouth daily with breakfast.  ? folic acid (FOLVITE) 1 MG tablet Take 1 mg by mouth daily.  ? furosemide (LASIX) 20 MG tablet Take 20 mg by mouth daily as needed for fluid or edema.  ? methotrexate 2.5 MG tablet Take 20 mg by mouth every Wednesday.   ? nitroGLYCERIN (NITROSTAT) 0.4 MG SL tablet PLACE 1 TABLET UNDER THE TONGUE EVERY 5 MINUTES AS NEEDED FOR CHEST PAIN  ? pantoprazole (PROTONIX) 40 MG tablet Take 1 tablet (  40 mg total) by mouth daily.  ? sucralfate (CARAFATE) 1 g tablet Take 1 g by mouth 2 (two) times daily.  ? verapamil (VERELAN PM) 360 MG 24 hr capsule Take 1 capsule (360 mg total) by mouth at bedtime.  ? Vitamin D, Cholecalciferol, 25 MCG (1000 UT) TABS Take 1 tablet by mouth daily.  ?  ? ?Allergies:   Patient has no known allergies.  ? ?Social History  ? ?Socioeconomic History  ? Marital status: Widowed  ?  Spouse name: Not on file  ? Number of children: Not on file  ? Years of education: Not on file  ? Highest education level: Not on file  ?Occupational History  ? Not on file  ?Tobacco Use  ? Smoking status: Never  ? Smokeless tobacco: Never  ?Vaping Use  ? Vaping Use: Never used  ?Substance and Sexual Activity  ? Alcohol use:  No  ? Drug use: No  ? Sexual activity: Not on file  ?Other Topics Concern  ? Not on file  ?Social History Narrative  ? Not on file  ? ?Social Determinants of Health  ? ?Financial Resource Strain: Not on file  ?Food Insecurity: Not on file  ?Transportation Needs: Not on file  ?Physical Activity: Not on file  ?Stress: Not on file  ?Social Connections: Not on file  ?  ?One of her kids died from leukemia not HCM ? ?Family History: ?The patient's family history includes Alcohol abuse in her father; Hypertension in her mother; Liver disease in her mother. ? ?ROS:   ?Please see the history of present illness.    ? All other systems reviewed and are negative. ? ?EKGs/Labs/Other Studies Reviewed:   ? ?The following studies were reviewed today: ?  ?Recent Labs: ?07/31/2021: B Natriuretic Peptide 406.0; Hemoglobin 11.5; Platelets 199 ?08/03/2021: BUN 17; Creatinine, Ser 0.99; Potassium 4.3; Sodium 141  ?Recent Lipid Panel ?   ?Component Value Date/Time  ? CHOL 115 08/02/2021 0646  ? TRIG 87 08/02/2021 0646  ? HDL 46 08/02/2021 0646  ? CHOLHDL 2.5 08/02/2021 0646  ? VLDL 17 08/02/2021 0646  ? [place] 52 08/02/2021 0646  ? ? ?Physical Exam:   ? ?VS:  BP 95/68   Pulse 71   Ht 5' 2.5" (1.588 m)   Wt 178 lb (80.7 kg)   SpO2 98%   BMI 32.04 kg/m?    ? ?Wt Readings from Last 3 Encounters:  ?10/27/21 178 lb (80.7 kg)  ?08/17/21 180 lb 12.4 oz (82 kg)  ?08/13/21 177 lb (80.3 kg)  ?  ?Gen: no distress, elderly female   ?Neck: No JV ?Cardiac: No Rubs or Gallops, harsh systolic Murmur worse with hand grip and standing, RRR and +2 radial pulses ?Respiratory: Clear to auscultation bilaterally, normal effort, normal  respiratory rate ?GI: Soft, nontender, non-distended  ?MS: No  edema;  moves all extremities ?Integument: Skin feels ok ?Neuro:  At time of evaluation, alert and oriented to person/place/time/situation  ?Psych: Normal affect, patient feels ok ? ? ?ASSESSMENT:   ? ?1. HOCM (hypertrophic obstructive cardiomyopathy) ([place])   ?2.  Palpitations   ? ? ?PLAN:   ? ?Hypertrophic Cardiomyopathy ?- Septal Variant; peak gradient 80  mm Hg  ?- with associated MR ?- Her daughter is a Marine scientist who lives in [place], she will come to her winter follow up appointment and we will discuss with genetic testing ? ?- will repeat 14 day non live ziopatch, in no sigificant NSVT will not pursue CMR ? ?-  NYHA I ?- on 20 mg Lasix q48 hours and with normal LVEF ?- on verapamil 300, low blood pressure limits further titration of secondary AV nodal agent ?- patient has strong feelings about SRT; we will not plan of surgery unless other surgical indications ?- if worsening sx; patient weill call and we will begin the process to start mav[place]amten ? ? ?We would like an interpretor at the next visit ? ?Time Spent Directly with Patient: ?  ?I have spent a total of 40 minutes with the patient reviewing notes, imaging, EKGs, labs and examining the patient as well as establishing an assessment and plan that was discussed personally with the patient.  > 50% of time was spent in direct patient care and family (SIL). ? ? ?   ? ?Medication Adjustments/Labs and Tests Ordered: ?Current medicines are reviewed at length with the patient today.  Concerns regarding medicines are outlined above.  ?Orders Placed This Encounter  ?Procedures  ? LONG TERM MONITOR (3-14 DAYS)  ? ?No orders of the defined types were placed in this encounter. ? ? ?Patient Instructions  ?Medication Instructions:  ?Your physician recommends that you continue on your current medications as directed. Please refer to the Current Medication list given to you today. ? ?*If you need a refill on your cardiac medications before your next appointment, please call your pharmacy* ? ? ?Lab Work: ?NONE ?If you have labs (blood work) drawn today and your tests are completely normal, you will receive your results only by: ?MyChart Message (if you have MyChart) OR ?A paper copy in the mail ?If you have any lab test that is  abnormal or we need to change your treatment, we will call you to review the results. ? ? ?Testing/Procedures: ?Your physician has requested that you wear a 14 day heart monitor.  ? ? ?Follow-Up: ?At [facility], y

## 2021-10-27 ENCOUNTER — Ambulatory Visit (INDEPENDENT_AMBULATORY_CARE_PROVIDER_SITE_OTHER): Payer: Medicare Other

## 2021-10-27 ENCOUNTER — Ambulatory Visit (INDEPENDENT_AMBULATORY_CARE_PROVIDER_SITE_OTHER): Payer: Medicare Other | Admitting: Internal Medicine

## 2021-10-27 ENCOUNTER — Encounter: Payer: Self-pay | Admitting: Internal Medicine

## 2021-10-27 VITALS — BP 95/68 | HR 71 | Ht 62.5 in | Wt 178.0 lb

## 2021-10-27 DIAGNOSIS — I422 Other hypertrophic cardiomyopathy: Secondary | ICD-10-CM | POA: Diagnosis not present

## 2021-10-27 DIAGNOSIS — I421 Obstructive hypertrophic cardiomyopathy: Secondary | ICD-10-CM

## 2021-10-27 DIAGNOSIS — R002 Palpitations: Secondary | ICD-10-CM

## 2021-10-27 NOTE — Patient Instructions (Addendum)
Medication Instructions:  ?Your physician recommends that you continue on your current medications as directed. Please refer to the Current Medication list given to you today. ? ?*If you need a refill on your cardiac medications before your next appointment, please call your pharmacy* ? ? ?Lab Work: ?NONE ?If you have labs (blood work) drawn today and your tests are completely normal, you will receive your results only by: ?MyChart Message (if you have MyChart) OR ?A paper copy in the mail ?If you have any lab test that is abnormal or we need to change your treatment, we will call you to review the results. ? ? ?Testing/Procedures: ?Your physician has requested that you wear a 14 day heart monitor.  ? ? ?Follow-Up: ?At [facility], you and your health needs are our priority.  As part of our continuing mission to provide you with exceptional heart care, we have created designated Provider Care Teams.  These Care Teams include your primary Cardiologist (physician) and Advanced Practice Providers (APPs -  Physician Assistants and Nurse Practitioners) who all work together to provide you with the care you need, when you need it. ? ?We recommend signing up for the patient portal called "MyChart".  Sign up information is provided on this After Visit Summary.  MyChart is used to connect with patients for Virtual Visits (Telemedicine).  Patients are able to view lab/test results, encounter notes, upcoming appointments, etc.  Non-urgent messages can be sent to your provider as well.   ?To learn more about what you can do with MyChart, go to [email or web address].   ? ?Your next appointment:   ?9 month(s) please make sure daughter is present ? ?The format for your next appointment:   ?In Person ? ?Provider:   ?Riley Lam, MD  ? ? ?Other Instructions ?Genetic testing for HCM: [email or web address]  ? ?ZIO XT- Long Term Monitor Instructions ? ?Your physician has requested you wear a ZIO patch monitor for 14 days.  ?This is a  single patch monitor. [employer] supplies one patch monitor per enrollment. Additional ?stickers are not available. Please do not apply patch if you will be having a Nuclear Stress Test,  ?Echocardiogram, Cardiac CT, MRI, or Chest Xray during the period you would be wearing the  ?monitor. The patch cannot be worn during these tests. You cannot remove and re-apply the  ?ZIO XT patch monitor.  ?Your ZIO patch monitor will be mailed 3 day USPS to your address on file. It may take 3-5 days  ?to receive your monitor after you have been enrolled.  ?Once you have received your monitor, please review the enclosed instructions. Your monitor  ?has already been registered assigning a specific monitor serial # to you. ? ?Billing and Patient Assistance Program Information ? ?We have supplied [employer] with any of your insurance information on file for billing purposes. ?[employer] offers a sliding scale Patient Assistance Program for patients that do not have  ?insurance, or whose insurance does not completely cover the cost of the ZIO monitor.  ?You must apply for the Patient Assistance Program to qualify for this discounted rate.  ?To apply, please call [employer] at [phone], select option 4, select option 2, ask to apply for  ?Patient Assistance Program. Meredeth Ide will ask your household income, and how many people  ?are in your household. They will quote your out-of-pocket cost based on that information.  ?[employer] will also be able to set up a 49-month, interest-free payment plan if needed. ? ?Applying the monitor ?  ?Shave  hair from upper left chest.  ?Hold abrader disc by orange tab. Rub abrader in 40 strokes over the upper left chest as  ?indicated in your monitor instructions.  ?Clean area with 4 enclosed alcohol pads. Let dry.  ?Apply patch as indicated in monitor instructions. Patch will be placed under collarbone on left  ?side of chest with arrow pointing upward.  ?Rub patch adhesive wings for 2 minutes. Remove white label  marked "1". Remove the white  ?label marked "2". Rub patch adhesive wings for 2 additional minutes.  ?While looking in a mirror, press and release button in center of patch. A small green light will  ?flash 3-4 times. This will be your only indicator that the monitor has been turned on.  ?Do not shower for the first 24 hours. You may shower after the first 24 hours.  ?Press the button if you feel a symptom. You will hear a small click. Record Date, Time and  ?Symptom in the Patient Logbook.  ?When you are ready to remove the patch, follow instructions on the last 2 pages of Patient  ?Logbook. Stick patch monitor onto the last page of Patient Logbook.  ?Place Patient Logbook in the blue and white box. Use locking tab on box and tape box closed  ?securely. The blue and white box has prepaid postage on it. Please place it in the mailbox as  ?soon as possible. Your physician should have your test results approximately 7 days after the  ?monitor has been mailed back to [facility]  ?Call [facility] at [phone] if you have questions regarding  ?your ZIO XT patch monitor. Call them immediately if you see an orange light blinking on your  ?monitor.  ?If your monitor falls off in less than 4 days, contact our Monitor department at [phone].  ?If your monitor becomes loose or falls off after 4 days call [employer] at [phone] for  ?suggestions on securing your monitor  ? ?Important Information About [employer] ? ? ? ? ?  ?

## 2021-10-27 NOTE — Progress Notes (Unsigned)
Applied a Zio Xt monitor to patient in the office ?

## 2021-12-15 ENCOUNTER — Inpatient Hospital Stay (HOSPITAL_COMMUNITY): Payer: Medicare Other | Attending: Hematology

## 2021-12-15 DIAGNOSIS — Z7982 Long term (current) use of aspirin: Secondary | ICD-10-CM | POA: Insufficient documentation

## 2021-12-15 DIAGNOSIS — D72819 Decreased white blood cell count, unspecified: Secondary | ICD-10-CM | POA: Insufficient documentation

## 2021-12-15 DIAGNOSIS — M069 Rheumatoid arthritis, unspecified: Secondary | ICD-10-CM | POA: Diagnosis not present

## 2021-12-15 DIAGNOSIS — Z79899 Other long term (current) drug therapy: Secondary | ICD-10-CM | POA: Diagnosis not present

## 2021-12-15 LAB — CBC
HCT: 33.9 % — ABNORMAL LOW (ref 36.0–46.0)
Hemoglobin: 11.2 g/dL — ABNORMAL LOW (ref 12.0–15.0)
MCH: 32.2 pg (ref 26.0–34.0)
MCHC: 33 g/dL (ref 30.0–36.0)
MCV: 97.4 fL (ref 80.0–100.0)
Platelets: 194 10*3/uL (ref 150–400)
RBC: 3.48 MIL/uL — ABNORMAL LOW (ref 3.87–5.11)
RDW: 15.6 % — ABNORMAL HIGH (ref 11.5–15.5)
WBC: 4.2 10*3/uL (ref 4.0–10.5)
nRBC: 0 % (ref 0.0–0.2)

## 2021-12-15 LAB — LACTATE DEHYDROGENASE: LDH: 188 U/L (ref 98–192)

## 2021-12-21 NOTE — Progress Notes (Signed)
[facility] [address], [place] [address]   CLINIC:  Medical Oncology/Hematology  PCP:  [name], MD [address] [place] [address] [phone]   REASON FOR VISIT:  Follow-up for evaluation of mild leukopenia  PRIOR THERAPY: None  CURRENT THERAPY: Under work-up  INTERVAL HISTORY:  Dana [name] 81 y.o. female returns for routine follow-up of leukopenia.  She was last seen by Rojelio Brenner PA-C on 08/17/2021.  **NOTE:  Patient's primary language is Samoan - she was offered interpreter services via telephone, but she declined and indicated that she would like her son-in-law Dana [name], who is present during visit, to act as interpreter for her.  At today's visit, she reports feeling well.  She has not had any hospitalizations since her last visit.  No new diagnoses or changes to her baseline health status.    Since her last visit, she has not had any infections.  She denies any B symptoms such as fever, chills, night sweats, or unexplained weight loss.  She has not noticed any new lumps or bumps. She remains on methotrexate 20 mg weekly for her rheumatoid arthritis.  She has 100% energy and 100% appetite.   She endorses that she is maintaining a stable weight.   REVIEW OF SYSTEMS:    Review of Systems  Constitutional:  Negative for appetite change, chills, diaphoresis, fatigue, fever and unexpected weight change.  HENT:   Negative for lump/mass and nosebleeds.   Eyes:  Negative for eye problems.  Respiratory:  Negative for cough, hemoptysis and shortness of breath.   Cardiovascular:  Negative for chest pain, leg swelling and palpitations.  Gastrointestinal:  Negative for abdominal pain, blood in stool, constipation, diarrhea, nausea and vomiting.  Genitourinary:  Negative for hematuria.   Skin: Negative.   Neurological:  Negative for dizziness, headaches and light-headedness.  Hematological:  Does not bruise/bleed easily.      PAST  MEDICAL/SURGICAL HISTORY:  Past Medical History:  Diagnosis Date   CAD (coronary artery disease)    a. s/p DES to OM1 in 02/2017   Chest pain    Essential hypertension    Gout    HOCM (hypertrophic obstructive cardiomyopathy) (HCC)    Hyperlipidemia    Rheumatoid arthritis (HCC)    Past Surgical History:  Procedure Laterality Date   CHOLECYSTECTOMY N/A 08/17/2018   Procedure: LAPAROSCOPIC CHOLECYSTECTOMY;  Surgeon: [name], MD;  Location: [facility];  Service: General;  Laterality: N/A;   CORONARY BALLOON ANGIOPLASTY N/A 03/03/2017   Procedure: CORONARY BALLOON ANGIOPLASTY;  Surgeon: Marykay Lex, MD;  Location: [facility] INVASIVE CV LAB;  Service: Cardiovascular;  Laterality: N/A;   CORONARY STENT INTERVENTION N/A 03/03/2017   Procedure: CORONARY STENT INTERVENTION;  Surgeon: Marykay Lex, MD;  Location: [facility] INVASIVE CV LAB;  Service: Cardiovascular;  Laterality: N/A;   LEFT HEART CATH AND CORONARY ANGIOGRAPHY N/A 03/02/2017   Procedure: LEFT HEART CATH AND CORONARY ANGIOGRAPHY;  Surgeon: Tonny Bollman, MD;  Location: [facility] INVASIVE CV LAB;  Service: Cardiovascular;  Laterality: N/A;     SOCIAL HISTORY:  Social History   Socioeconomic History   Marital status: Widowed    Spouse name: Not on file   Number of children: Not on file   Years of education: Not on file   Highest education level: Not on file  Occupational History   Not on file  Tobacco Use   Smoking status: Never   Smokeless tobacco: Never  Vaping Use   Vaping Use:  Never used  Substance and Sexual Activity   Alcohol use: No   Drug use: No   Sexual activity: Not on file  Other Topics Concern   Not on file  Social History Narrative   Not on file   Social Determinants of Health   Financial Resource Strain: Not on file  Food Insecurity: Not on file  Transportation Needs: Not on file  Physical Activity: Not on file  Stress: Not on file  Social Connections: Not on file  Intimate Partner Violence: Not on file     FAMILY HISTORY:  Family History  Problem Relation Age of Onset   Hypertension Mother    Liver disease Mother    Alcohol abuse Father     CURRENT MEDICATIONS:  Outpatient Encounter Medications as of 12/22/2021  Medication Sig   acetaminophen (TYLENOL) 325 MG tablet Take 2 tablets (650 mg total) by mouth every 6 (six) hours as needed for mild pain, fever or headache (or Fever >/= 101).   alendronate (FOSAMAX) 70 MG tablet Take 70 mg by mouth every Wednesday.   ALLERGY RELIEF 10 MG tablet Take 10 mg by mouth daily.   allopurinol (ZYLOPRIM) 300 MG tablet Take 300 mg by mouth daily.   aspirin EC 81 MG tablet Take 1 tablet (81 mg total) by mouth daily with breakfast.   atorvastatin (LIPITOR) 10 MG tablet Take 10 mg by mouth daily.   ezetimibe (ZETIA) 10 MG tablet Take 10 mg by mouth daily.   famotidine (PEPCID) 20 MG tablet Take 1 tablet (20 mg total) by mouth 2 (two) times daily.   ferrous sulfate 325 (65 FE) MG tablet Take 325 mg by mouth daily with breakfast.   folic acid (FOLVITE) 1 MG tablet Take 1 mg by mouth daily.   furosemide (LASIX) 20 MG tablet Take 20 mg by mouth daily as needed for fluid or edema.   methotrexate 2.5 MG tablet Take 20 mg by mouth every Wednesday.    nitroGLYCERIN (NITROSTAT) 0.4 MG SL tablet PLACE 1 TABLET UNDER THE TONGUE EVERY 5 MINUTES AS NEEDED FOR CHEST PAIN   pantoprazole (PROTONIX) 40 MG tablet Take 1 tablet (40 mg total) by mouth daily.   sucralfate (CARAFATE) 1 g tablet Take 1 g by mouth 2 (two) times daily.   verapamil (VERELAN PM) 360 MG 24 hr capsule Take 1 capsule (360 mg total) by mouth at bedtime.   Vitamin D, Cholecalciferol, 25 MCG (1000 UT) TABS Take 1 tablet by mouth daily.   No facility-administered encounter medications on file as of 12/22/2021.    ALLERGIES:  No Known Allergies   PHYSICAL EXAM:  ECOG PERFORMANCE STATUS: 0 - Asymptomatic    There were no vitals filed for this visit. There were no vitals filed for this  visit. Physical Exam Constitutional:      Appearance: Normal appearance. She is obese.  HENT:     Head: Normocephalic and atraumatic.     Mouth/Throat:     Mouth: Mucous membranes are moist.  Eyes:     Extraocular Movements: Extraocular movements intact.     Pupils: Pupils are equal, round, and reactive to light.  Cardiovascular:     Rate and Rhythm: Normal rate and regular rhythm.     Pulses: Normal pulses.     Heart sounds: Murmur heard.  Pulmonary:     Effort: Pulmonary effort is normal.     Breath sounds: Normal breath sounds.  Abdominal:     General: Bowel sounds are normal.  Palpations: Abdomen is soft.     Tenderness: There is no abdominal tenderness.  Musculoskeletal:        General: No swelling.     Right lower leg: No edema.     Left lower leg: No edema.  Lymphadenopathy:     Cervical: No cervical adenopathy.  Skin:    General: Skin is warm and dry.  Neurological:     General: No focal deficit present.     Mental Status: She is alert and oriented to person, place, and time.  Psychiatric:        Mood and Affect: Mood normal.        Behavior: Behavior normal.      LABORATORY DATA:  I have reviewed the labs as listed.  CBC    Component Value Date/Time   WBC 4.2 12/15/2021 0813   RBC 3.48 (L) 12/15/2021 0813   HGB 11.2 (L) 12/15/2021 0813   HCT 33.9 (L) 12/15/2021 0813   PLT 194 12/15/2021 0813   MCV 97.4 12/15/2021 0813   MCH 32.2 12/15/2021 0813   MCHC 33.0 12/15/2021 0813   RDW 15.6 (H) 12/15/2021 0813   LYMPHSABS 0.9 07/27/2021 0959   MONOABS 0.5 07/27/2021 0959   EOSABS 0.2 07/27/2021 0959   BASOSABS 0.0 07/27/2021 0959      Latest Ref Rng & Units 08/03/2021    4:13 AM 08/02/2021    6:46 AM 07/31/2021   10:47 PM  CMP  Glucose 70 - 99 mg/dL 979  95  480   BUN 8 - 23 mg/dL 17  19  16    Creatinine 0.44 - 1.00 mg/dL  1.65  5.37   Sodium 135 - 145 mmol/L 141  140  138   Potassium 3.5 - 5.1 mmol/L 4.3  3.8  3.9   Chloride 98 - 111 mmol/L  106  102  106   CO2 22 - 32 mmol/L 27  28  24    Calcium 8.9 - 10.3 mg/dL 8.9  8.3  8.5     DIAGNOSTIC IMAGING:  I have independently reviewed the relevant imaging and discussed with the patient.  ASSESSMENT & PLAN: 1.  Leukopenia - Here at the request of her primary care provider (NP 4.82) for the evaluation of leukopenia - Per records sent by PCP, patient was noted to have recent CBC (06/11/2021) with mildly low WBC 3.3 with normal differential, normal Hgb 11.7/MCV 97, normal platelets 238.  Prior labs (03/19/2021) show WBC 3.3 with normal differential.  Before that, patient had normal WBC 4.1 (07/08/2020). - She denies any frequent infections or B symptoms    - No lymphadenopathy or hepatosplenomegaly on exam.     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  Normal LDH. - She takes methotrexate 20 mg weekly (prescribed by Dr. 08/19/2021 in [place]), as well as daily folic acid supplement.   - Differential diagnosis favors myelosuppression from methotrexate, but also includes early MDS or benign ethnic neutropenia. - PLAN: Repeat labs and RTC in 6 months.  If stable over the next year, will consider discharge from clinic. - If she develops any significant neutropenia  or other cytopenias in the future, we will speak with Dr. Deanne Coffer (rheumatology in [place]) and see if we can decrease the dose of her methotrexate.  2.  Iron deficiency anemia - Being managed by primary care provider - Currently taking  ferrous sulfate 325 mg daily - Most recent labs sent by PCP (03/19/2021): Show ferritin 165, iron saturation 59%, and TIBC 271 - PLAN: Hemoglobin and iron levels seem to be well managed by PCP at this time, without need for IV iron infusions. - Continue to defer management of IDA to primary care provider, but we remain available to assist if needed.   3.  Other history - PMH:  Hypertrophic obstructive cardiomyopathy, hypertension, CAD s/p stents, diastolic congestive heart failure, gouty arthritis, hyperlipidemia, GERD, iron deficiency anemia, and rheumatoid arthritis - SOCIAL: She lives at home.  She worked as a Futures trader.  No tobacco, alcohol, illicit drug use. - FAMILY: No known blood disorders or malignancy in the patient's family.   All questions were answered. The patient knows to call the clinic with any problems, questions or concerns.  Medical decision making: Low   Time spent on visit: I spent 15 minutes counseling the patient face to face. The total time spent in the appointment was 23 minutes and more than 50% was on counseling.   Carnella Guadalajara, PA-C   12/22/21 8:30 AM

## 2021-12-22 ENCOUNTER — Inpatient Hospital Stay (HOSPITAL_BASED_OUTPATIENT_CLINIC_OR_DEPARTMENT_OTHER): Payer: Medicare Other | Admitting: Physician Assistant

## 2021-12-22 VITALS — BP 142/69 | HR 77 | Temp 98.0°F | Resp 20 | Ht 62.6 in | Wt 177.0 lb

## 2021-12-22 DIAGNOSIS — D72819 Decreased white blood cell count, unspecified: Secondary | ICD-10-CM | POA: Diagnosis not present

## 2021-12-22 DIAGNOSIS — D649 Anemia, unspecified: Secondary | ICD-10-CM | POA: Diagnosis not present

## 2021-12-22 NOTE — Patient Instructions (Signed)
[facility] Discharge Instructions  You were seen today by Rojelio Brenner PA-C for your low white blood cells - your blood counts looked good today!    Continue taking iron tablet once daily. Take over-the-counter ferrous sulfate 325 mg Take your iron in the morning. Take your iron pill with a glass of orange juice to help your body absorb it better. If you take any antacid or acid reflux medicine (pantoprazole and sucralfate) at home, take your iron at a different time, separated by at least 2 hours from your antacid medication. If you experience any constipation from your iron tablet, try taking over the stool softener such as Colace once daily. If you continue to have severe side effects such as constipation or upset stomach, you can decrease your iron to every other day instead. If you are still having severe side effects at lower dose, please stop taking your iron tablet and call our office to let us know.  FOLLOW-UP APPOINTMENT: Labs and follow up visit in 6 months   Thank you for choosing [facility] at [facility] to provide your oncology and hematology care.  To afford each patient quality time with our provider, please arrive at least 15 minutes before your scheduled appointment time.   If you have a lab appointment with the Cancer Center please come in thru the Main Entrance and check in at the main information desk.  You need to re-schedule your appointment should you arrive 10 or more minutes late.  We strive to give you quality time with our providers, and arriving late affects you and other patients whose appointments are after yours.  Also, if you no show three or more times for appointments you may be dismissed from the clinic at the providers discretion.     Again, thank you for choosing [facility].  Our hope is that these requests will decrease the amount of time that you wait before being seen by  our physicians.       _____________________________________________________________  Should you have questions after your visit to [facility], please contact our office at [phone] and follow the prompts.  Our office hours are 8:00 a.m. and 4:30 p.m. Monday - Friday.  Please note that voicemails left after 4:00 p.m. may not be returned until the following business day.  We are closed weekends and major holidays.  You do have access to a nurse 24-7, just call the main number to the clinic [phone] and do not press any options, hold on the line and a nurse will answer the phone.    For prescription refill requests, have your pharmacy contact our office and allow 72 hours.    Due to Covid, you will need to wear a mask upon entering the hospital. If you do not have a mask, a mask will be given to you at the Main Entrance upon arrival. For doctor visits, patients may have 1 support person age 80 or older with them. For treatment visits, patients can not have anyone with them due to social distancing guidelines and our immunocompromised population.

## 2022-01-31 NOTE — Progress Notes (Unsigned)
Cardiology Office Note:    Date:  02/01/2022   ID:  Dana [name], DOB 06-11-1941, MRN [id number]  PCP:  Dana Grippe, MD   [facility] Providers Cardiologist:  Dana Dell, MD     Referring MD: Dana Grippe, MD   CC:  Follow up HOCM  Dana [name]  History of Present Illness:    Dana [name] is a 81 y.o. female with a hx of CAD s/p PCI OM1 and HOCM with preserved LVEF and LVOT gradient of 100 mmHg.  She was unable to do stress test due to severe gradient.  She is back from her trip. 2023: started AV nodal therapy with improvement-> now no symptoms.  Patient notes no SOB at rest and no DOE. Notes no fatigue. Notes rare palpitations Notes no CP. Notes rare Dizziness. Notes no syncope.  Her daughter is today for visit.  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  She has four brothers.  One may be coming to the [place].  None of her family has been screened yet.  Past Medical History:  Diagnosis Date   CAD (coronary artery disease)    a. s/p DES to OM1 in 02/2017   Chest pain    Essential hypertension    Gout    HOCM (hypertrophic obstructive cardiomyopathy) (HCC)    Hyperlipidemia    Rheumatoid arthritis (HCC)     Past Surgical History:  Procedure Laterality Date   CHOLECYSTECTOMY N/A 08/17/2018   Procedure: LAPAROSCOPIC CHOLECYSTECTOMY;  Surgeon: [name], MD;  Location: [facility];  Service: General;  Laterality: N/A;   CORONARY BALLOON ANGIOPLASTY N/A 03/03/2017   Procedure: CORONARY BALLOON ANGIOPLASTY;  Surgeon: Marykay Lex, MD;  Location: [facility] INVASIVE CV LAB;  Service: Cardiovascular;  Laterality: N/A;   CORONARY STENT INTERVENTION N/A 03/03/2017   Procedure: CORONARY STENT INTERVENTION;  Surgeon: Marykay Lex, MD;  Location: [facility] INVASIVE CV LAB;  Service: Cardiovascular;  Laterality: N/A;   LEFT HEART CATH AND CORONARY ANGIOGRAPHY N/A 03/02/2017   Procedure: LEFT HEART CATH AND CORONARY ANGIOGRAPHY;  Surgeon: Tonny Bollman, MD;  Location: [facility] INVASIVE CV  LAB;  Service: Cardiovascular;  Laterality: N/A;    Current Medications: Current Meds  Medication Sig   acetaminophen (TYLENOL) 325 MG tablet Take 2 tablets (650 mg total) by mouth every 6 (six) hours as needed for mild pain, fever or headache (or Fever >/= 101).   alendronate (FOSAMAX) 70 MG tablet Take 70 mg by mouth every Wednesday.   ALLERGY RELIEF 10 MG tablet Take 10 mg by mouth daily.   allopurinol (ZYLOPRIM) 300 MG tablet Take 300 mg by mouth daily.   aspirin EC 81 MG tablet Take 1 tablet (81 mg total) by mouth daily with breakfast.   atorvastatin (LIPITOR) 10 MG tablet Take 10 mg by mouth daily.   doxycycline (VIBRAMYCIN) 100 MG capsule Take 100 mg by mouth 2 (two) times daily.   ezetimibe (ZETIA) 10 MG tablet Take 10 mg by mouth daily.   famotidine (PEPCID) 20 MG tablet Take 1 tablet (20 mg total) by mouth 2 (two) times daily.   ferrous sulfate 325 (65 FE) MG tablet Take 325 mg by mouth daily with breakfast.   folic acid (FOLVITE) 1 MG tablet Take 1 mg by mouth daily.   furosemide (LASIX) 20 MG tablet Take 20 mg by mouth daily as needed for fluid or edema.   methotrexate 2.5 MG tablet Take 20 mg by mouth every Wednesday.    mupirocin ointment (BACTROBAN)  2 % as needed for rash.   nitroGLYCERIN (NITROSTAT) 0.4 MG SL tablet PLACE 1 TABLET UNDER THE TONGUE EVERY 5 MINUTES AS NEEDED FOR CHEST PAIN   pantoprazole (PROTONIX) 40 MG tablet Take 1 tablet (40 mg total) by mouth daily.   sucralfate (CARAFATE) 1 g tablet Take 1 g by mouth 2 (two) times daily.   verapamil (VERELAN PM) 360 MG 24 hr capsule Take 1 capsule (360 mg total) by mouth at bedtime.   Vitamin D, Cholecalciferol, 25 MCG (1000 UT) TABS Take 1 tablet by mouth daily.     Allergies:   Patient has no known allergies.   Social History   Socioeconomic History   Marital status: Widowed    Spouse name: Not on file   Number of children: Not on file   Years of education: Not on file   Highest education level: Not on file   Occupational History   Not on file  Tobacco Use   Smoking status: Never   Smokeless tobacco: Never  Vaping Use   Vaping Use: Never [name]  Substance and Sexual Activity   Alcohol use: No   Drug use: No   Sexual activity: Not on file  Other Topics Concern   Not on file  Social History Narrative   Not on file   Social Determinants of Health   Financial Resource Strain: Not on file  Food Insecurity: Not on file  Transportation Needs: Not on file  Physical Activity: Not on file  Stress: Not on file  Social Connections: Not on file    Social: One of her kids died from leukemia not HCM One Daughter (diamond) and four sons)  Family History: The patient's family history includes Alcohol abuse in her father; Hypertension in her mother; Liver disease in her mother.  ROS:   Please see the history of present illness.     All other systems reviewed and are negative.  EKGs/Labs/Other Studies Reviewed:    The following studies were reviewed today:   CORONARY STENT INTERVENTION, CORONARY STENT INTERVENTION 03/03/2017  Narrative Images from the original result were not included.   Ost LM lesion, 40 %stenosed.  1st Mrg lesion, 90 %stenosed.  A STENT SIERRA 3.00 X 12 MM drug eluting stent was successfully placed.  Post intervention, there is a 0% residual stenosis.  Lat 1st Mrg lesion, 60 %stenosed. Bifurcation lesion jailed by the initial stent  Post intervention - rescue PTCA only, there is a 20% residual stenosis.  Successful bifurcation PCI of large OM1 with major branch of OM1 PTCA. A Xience Sierra DES stent was [name] for the main branch and then PTCA was performed through the stent into the side branch for ostial stenosis.    NM Myocar Multi W/Spect W/Wall Motion / EF 01/30/2017  Narrative  This is a low risk study.  The left ventricular ejection fraction is mildly decreased (45-54%).  There was no ST segment deviation noted during stress.  Significant bowel  artifact obscures some of the inferior and lateral wall No ischemia EF 54% low risk study   No results found for this or any previous visit from the past 3650 days.   ECHO COMPLETE WITH IMAGING ENHANCING AGENT 08/01/2021  Narrative ECHOCARDIOGRAM REPORT    Patient Name:   MARITTA KIEF Date of Exam: 08/01/2021 Medical Rec #:  [id number]    Height:       61.0 in Accession #:    [id number]   Weight:       185.0  lb Date of Birth:  Apr 15, 1941    BSA:          1.827 m Patient Age:    37 years     BP:           128/66 mmHg Patient Gender: F            HR:           65 bpm. Exam Location:  Inpatient  Procedure: 2D Echo, Cardiac Doppler, Color Doppler and Intracardiac Opacification Agent  Indications:    Hypertrophic obstructive cardiomyopathy I42.1 Chest Pain R07.9  History:        Patient has prior history of Echocardiogram examinations, most recent 08/07/2019. CHF, CAD; Risk Factors:Hypertension, Dyslipidemia and Non-Smoker.  Sonographer:    Roosvelt Maser RDCS Referring Phys: [id number] Ellsworth Lennox  IMPRESSIONS   1. Dynamic LVOT gradient ~ 100 mmhg. Left ventricular ejection fraction, by estimation, is 65 to 70%. The left ventricle has normal function. The left ventricle has no regional wall motion abnormalities. There is severe asymmetric left ventricular hypertrophy of the basal segment. Left ventricular diastolic parameters are consistent with Grade I diastolic dysfunction (impaired relaxation). 2. Right ventricular systolic function is normal. The right ventricular size is normal. There is normal pulmonary artery systolic pressure. 3. Mild mitral valve regurgitation. 4. Aortic valve regurgitation is not visualized. Aortic valve sclerosis/calcification is present, without any evidence of aortic stenosis. 5. The inferior vena cava is normal in size with greater than 50% respiratory variability, suggesting right atrial pressure of 3 mmHg.  Comparison(s): No significant change from  prior study.  Conclusion(s)/Recommendation(s): Findings consistent with hypertrophic obstructive cardiomyopathy.   ECHO LIMITED WO IMAGE ENHANCING AGENT 10/06/2021 1. 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 The left ventricle has hyperdynamic function. There is severe asymmetric left ventricular hypertrophy. Left ventricular diastolic parameters are consistent with Grade II diastolic dysfunction (pseudonormalization). Elevated left atrial pressure. The average left ventricular global longitudinal strain is -18.3 %. The global longitudinal strain is normal. 2. Right ventricular systolic function is normal. The right ventricular size is normal. 3. Left atrial size was severely dilated. 4. Severe systolic anterior motion of the mitral valve with severe LV outflow obstruction and mitral insufficiency.. Moderate to severe mitral valve regurgitation. 5. The peak gradient due to dynamic outflow tract obstruction is approximately 80 mm hg at rest (Valsalva maneuver was not performed).. The aortic valve is tricuspid. There is mild calcification of the aortic valve. There is mild thickening of the aortic valve. Aortic valve regurgitation is trivial. Aortic valve sclerosis/calcification is present, without any evidence of aortic stenosis.    Cardiac event monitor    LONG TERM MONITOR (8-14 DAYS) HOOK UP AND INTERPRETATION 11/17/2021  Narrative  Patient had a minimum heart rate of 37 bpm, maximum heart rate of 160 bpm, and average heart rate of 78 bpm.  Predominant underlying rhythm was sinus rhythm.  P-SVT 10 episodes lasting 10 beats at longest with a max rate of 160 bpm at fastest.  Isolated PACs were rare (<1.0%).  Isolated PVCs were rare (<1.0%).  Two nocturnal Wenckebach episodes noted.  No triggered and diary events.   Recent Labs: 07/31/2021: B Natriuretic Peptide 406.0 08/03/2021:  BUN 17; Creatinine, Ser 0.99; Potassium 4.3; Sodium 141 12/15/2021: Hemoglobin 11.2; Platelets 194  Recent Lipid Panel    Component Value Date/Time   CHOL 115 08/02/2021 0646   TRIG 87 08/02/2021 0646  HDL 46 08/02/2021 0646   CHOLHDL 2.5 08/02/2021 0646   VLDL 17 08/02/2021 0646   LDLCALC 52 08/02/2021 0646    Physical Exam:    VS:  BP 125/77   Pulse 65   Ht 5\' 6"  (1.676 m)   Wt 177 lb 6.4 oz (80.5 kg)   SpO2 98%   BMI 28.63 kg/m     Wt Readings from Last 3 Encounters:  02/01/22 177 lb 6.4 oz (80.5 kg)  12/22/21 177 lb 0.5 oz (80.3 kg)  10/27/21 178 lb (80.7 kg)    Gen: no distress, elderly female   Neck: No JV Cardiac: No Rubs or Gallops, harsh systolic Murmur worse with hand grip and standing, RRR and +2 radial pulses Respiratory: Clear to auscultation bilaterally, normal effort, normal  respiratory rate GI: Soft, nontender, non-distended  MS: No  edema;  moves all extremities Integument: Skin feels ok Neuro:  At time of evaluation, alert and oriented to person/place/time/situation  Psych: Normal affect, patient feels ok   ASSESSMENT:    1. HOCM (hypertrophic obstructive cardiomyopathy) (HCC)     PLAN:    Hypertrophic Cardiomyopathy - Septal Variant; obstructive, limited evidence of small apical anerysm, preserved EF - with associated MR - NYHA I on 20 mg Lasix q48 hours and verapamil 360 mg; her heart rates likely precluded dual AV nodal agents - We discussed SCD risk with patient and her daughter; if there are questions marks about her CONTRAST echo in 6 months we will get an CMR for evaluation of a true apical aneurysm; if not will get an ziopatch 2024 - we will refer to genetic testing; two of her sons had rheumatic heart disease, no own has HCM - we discussed genetic testing with her and her daughter.  Gave patient health information from the [facility] for support - she is aware to call [facility] if worsening dizziness; she does not want surgical SRT but would be  amenable to discussing medications; she doesn't think she will need CMI  Dana [name]  Six months with me  Time Spent Directly with Patient:   I have spent a total of 60 minutes  with the patient reviewing notes, imaging, EKGs, labs and examining the patient as well as establishing an assessment and plan that was discussed personally with the patient.  > 50% of time was spent in direct patient care and family (daugther [place], [place]) .        Medication Adjustments/Labs and Tests Ordered: Current medicines are reviewed at length with the patient today.  Concerns regarding medicines are outlined above.  Orders Placed This Encounter  Procedures   Ambulatory referral to Genetics   EKG 12-Lead   ECHOCARDIOGRAM COMPLETE   No orders of the defined types were placed in this encounter.   Patient Instructions  Medication Instructions:  Your physician recommends that you continue on your current medications as directed. Please refer to the Current Medication list given to you today.  *If you need a refill on your cardiac medications before your next appointment, please call your pharmacy*   Lab Work: NONE If you have labs (blood work) drawn today and your tests are completely normal, you will receive your results only by: MyChart Message (if you have MyChart) OR A paper copy in the mail If you have any lab test that is abnormal or we need to change your treatment, we will call you to review the results.   Testing/Procedures: Your physician has referred you to see  a Runner, broadcasting/film/video  FEB 2024- Your physician has requested that you have an echocardiogram. Echocardiography is a painless test that uses sound waves to create images of your heart. It provides your doctor with information about the size and shape of your heart and how well your heart's chambers and valves are working. This procedure takes approximately one hour. There are no restrictions for this  procedure.   Follow-Up: At [facility], you and your health needs are our priority.  As part of our continuing mission to provide you with exceptional heart care, we have created designated Provider Care Teams.  These Care Teams include your primary Cardiologist (physician) and Advanced Practice Providers (APPs -  Physician Assistants and Nurse Practitioners) who all work together to provide you with the care you need, when you need it.  We recommend signing up for the patient portal called "MyChart".  Sign up information is provided on this After Visit Summary.  MyChart is [name] to connect with patients for Virtual Visits (Telemedicine).  Patients are able to view lab/test results, encounter notes, upcoming appointments, etc.  Non-urgent messages can be sent to your provider as well.   To learn more about what you can do with MyChart, go to [email or web address].    Your next appointment:   6 month(s)  The format for your next appointment:   In Person  Provider:  Riley Lam, MD  Important Information About Sugar         Signed, Christell Constant, MD  02/01/2022 12:34 PM    [facility]

## 2022-02-01 ENCOUNTER — Ambulatory Visit (INDEPENDENT_AMBULATORY_CARE_PROVIDER_SITE_OTHER): Payer: Medicare Other | Admitting: Internal Medicine

## 2022-02-01 ENCOUNTER — Encounter: Payer: Self-pay | Admitting: Internal Medicine

## 2022-02-01 VITALS — BP 125/77 | HR 65 | Ht 66.0 in | Wt 177.4 lb

## 2022-02-01 DIAGNOSIS — I421 Obstructive hypertrophic cardiomyopathy: Secondary | ICD-10-CM

## 2022-02-01 NOTE — Patient Instructions (Signed)
Medication Instructions:  Your physician recommends that you continue on your current medications as directed. Please refer to the Current Medication list given to you today.  *If you need a refill on your cardiac medications before your next appointment, please call your pharmacy*   Lab Work: NONE If you have labs (blood work) drawn today and your tests are completely normal, you will receive your results only by: MyChart Message (if you have MyChart) OR A paper copy in the mail If you have any lab test that is abnormal or we need to change your treatment, we will call you to review the results.   Testing/Procedures: Your physician has referred you to see a Genetics Counselor  FEB 2024- Your physician has requested that you have an echocardiogram. Echocardiography is a painless test that uses sound waves to create images of your heart. It provides your doctor with information about the size and shape of your heart and how well your heart's chambers and valves are working. This procedure takes approximately one hour. There are no restrictions for this procedure.   Follow-Up: At [facility], you and your health needs are our priority.  As part of our continuing mission to provide you with exceptional heart care, we have created designated Provider Care Teams.  These Care Teams include your primary Cardiologist (physician) and Advanced Practice Providers (APPs -  Physician Assistants and Nurse Practitioners) who all work together to provide you with the care you need, when you need it.  We recommend signing up for the patient portal called "MyChart".  Sign up information is provided on this After Visit Summary.  MyChart is used to connect with patients for Virtual Visits (Telemedicine).  Patients are able to view lab/test results, encounter notes, upcoming appointments, etc.  Non-urgent messages can be sent to your provider as well.   To learn more about what you can do with MyChart, go to  [email or web address].    Your next appointment:   6 month(s)  The format for your next appointment:   In Person  Provider:  Riley Lam, MD  Important Information About Sugar

## 2022-03-11 ENCOUNTER — Ambulatory Visit: Payer: Medicare Other | Attending: Internal Medicine | Admitting: Internal Medicine

## 2022-03-11 ENCOUNTER — Encounter: Payer: Self-pay | Admitting: Internal Medicine

## 2022-03-11 VITALS — BP 108/60 | HR 84 | Ht 62.0 in | Wt 185.0 lb

## 2022-03-11 DIAGNOSIS — I421 Obstructive hypertrophic cardiomyopathy: Secondary | ICD-10-CM

## 2022-03-11 DIAGNOSIS — I25118 Atherosclerotic heart disease of native coronary artery with other forms of angina pectoris: Secondary | ICD-10-CM

## 2022-03-11 DIAGNOSIS — R072 Precordial pain: Secondary | ICD-10-CM | POA: Diagnosis present

## 2022-03-11 NOTE — Patient Instructions (Addendum)
Medication Instructions:  Your physician recommends that you continue on your current medications as directed. Please refer to the Current Medication list given to you today.  *If you need a refill on your cardiac medications before your next appointment, please call your pharmacy*   Lab Work: None If you have labs (blood work) drawn today and your tests are completely normal, you will receive your results only by: MyChart Message (if you have MyChart) OR A paper copy in the mail If you have any lab test that is abnormal or we need to change your treatment, we will call you to review the results.   Testing/Procedures:  How to Prepare for Your Cardiac PET/CT Stress Test:  1. Please do not take these medications before your test:   Medications that may interfere with the cardiac pharmacological stress agent (ex. nitrates - including erectile dysfunction medications or beta-blockers) the day of the exam. (Erectile dysfunction medication should be held for at least 72 hrs prior to test) Theophylline containing medications for 12 hours. Dipyridamole 48 hours prior to the test. Your remaining medications may be taken with water.  2. Nothing to eat or drink, except water, 3 hours prior to arrival time.   NO caffeine/decaffeinated products, or chocolate 12 hours prior to arrival.  3. NO perfume, cologne or lotion  4. Total time is 1 to 2 hours; you may want to bring reading material for the waiting time.  5. Please report to Admitting at the [facility] Main Entrance 60 minutes early for your test.  [address] [place], [place] [address]  Diabetic Preparation:  Hold oral medications. You may take NPH and Lantus insulin. Do not take Humalog or Humulin R (Regular Insulin) the day of your test. Check blood sugars prior to leaving the house. If able to eat breakfast prior to 3 hour fasting, you may take all medications, including your insulin, Do not worry if you miss  your breakfast dose of insulin - start at your next meal.  IF YOU THINK YOU MAY BE PREGNANT, OR ARE NURSING PLEASE INFORM THE TECHNOLOGIST.  In preparation for your appointment, medication and supplies will be purchased.  Appointment availability is limited, so if you need to cancel or reschedule, please call the Radiology Department at [phone]  24 hours in advance to avoid a cancellation fee of $100.00  What to Expect After you Arrive:  Once you arrive and check in for your appointment, you will be taken to a preparation room within the Radiology Department.  A technologist or Nurse will obtain your medical history, verify that you are correctly prepped for the exam, and explain the procedure.  Afterwards,  an IV will be started in your arm and electrodes will be placed on your skin for EKG monitoring during the stress portion of the exam. Then you will be escorted to the PET/CT scanner.  There, staff will get you positioned on the scanner and obtain a blood pressure and EKG.  During the exam, you will continue to be connected to the EKG and blood pressure machines.  A small, safe amount of a radioactive tracer will be injected in your IV to obtain a series of pictures of your heart along with an injection of a stress agent.    After your Exam:  It is recommended that you eat a meal and drink a caffeinated beverage to counter act any effects of the stress agent.  Drink plenty of fluids for the remainder of the  day and urinate frequently for the first couple of hours after the exam.  Your doctor will inform you of your test results within 7-10 business days.  For questions about your test or how to prepare for your test, please call: Rockwell Alexandria, Cardiac Imaging Nurse Navigator  Larey Brick, Cardiac Imaging Nurse Navigator Office: [phone]    Follow-Up: At [facility], you and your health needs are our priority.  As part of our continuing mission to provide you with  exceptional heart care, we have created designated Provider Care Teams.  These Care Teams include your primary Cardiologist (physician) and Advanced Practice Providers (APPs -  Physician Assistants and Nurse Practitioners) who all work together to provide you with the care you need, when you need it.  We recommend signing up for the patient portal called "MyChart".  Sign up information is provided on this After Visit Summary.  MyChart is used to connect with patients for Virtual Visits (Telemedicine).  Patients are able to view lab/test results, encounter notes, upcoming appointments, etc.  Non-urgent messages can be sent to your provider as well.   To learn more about what you can do with MyChart, go to [email or web address].    Your next appointment:   December   The format for your next appointment:   In Person  Provider:   Dr. Izora Ribas  Other Instructions None  Important Information About Sugar

## 2022-03-11 NOTE — Progress Notes (Signed)
Cardiology Office Note:    Date:  03/11/2022   ID:  Kerrigan Gombos, DOB 13-Mar-1941, MRN [id number]  PCP:  [name], MD   [facility] Providers Cardiologist:  Nona Dell, MD     Referring MD: [name], MD   CC:  Follow up HOCM  History of Present Illness:    Mliss Wedin is a 81 y.o. female with a hx of CAD s/p PCI OM1 and HOCM with preserved LVEF and LVOT gradient of 100 mmHg.  She was unable to do stress test due to severe gradient.  She is back from her trip. 2023: started AV nodal therapy with improvement-> now no symptoms, pending genetics follow up.  Was seen in [place] in [place]: once for chest pain and once for shortness of breath requiring increased diuretic.  Patient notes New SOB from her ED visit has resolved. She was in [place] with family; she forgot her diuretic pill and ate more than she should. She felt short of breath. Seen in the ED.  Given IV lasix and diuresed ~ 2 L.  No further symptoms. Notes no fatigue. Notes no palpitations Notes rare CP.  I am unclear after interviewing her if this is similar to her past discomfort prior to her OM1 PCI.   She has been having nose bleeds that have now resolved. Notes rare dizziness. Notes no syncope. Notable family events include no SCD.  She has genetics eval 08/09/22 for potential discussion of genetic testing for her and her children   Past Medical History:  Diagnosis Date   CAD (coronary artery disease)    a. s/p DES to OM1 in 02/2017   Chest pain    Essential hypertension    Gout    HOCM (hypertrophic obstructive cardiomyopathy) (HCC)    Hyperlipidemia    Rheumatoid arthritis (HCC)     Past Surgical History:  Procedure Laterality Date   CHOLECYSTECTOMY N/A 08/17/2018   Procedure: LAPAROSCOPIC CHOLECYSTECTOMY;  Surgeon: [name], MD;  Location: [facility];  Service: General;  Laterality: N/A;   CORONARY BALLOON ANGIOPLASTY N/A 03/03/2017   Procedure: CORONARY BALLOON ANGIOPLASTY;  Surgeon: Marykay Lex, MD;  Location: [facility] INVASIVE CV LAB;  Service: Cardiovascular;  Laterality: N/A;   CORONARY STENT INTERVENTION N/A 03/03/2017   Procedure: CORONARY STENT INTERVENTION;  Surgeon: Marykay Lex, MD;  Location: [facility] INVASIVE CV LAB;  Service: Cardiovascular;  Laterality: N/A;   LEFT HEART CATH AND CORONARY ANGIOGRAPHY N/A 03/02/2017   Procedure: LEFT HEART CATH AND CORONARY ANGIOGRAPHY;  Surgeon: Tonny Bollman, MD;  Location: [facility] INVASIVE CV LAB;  Service: Cardiovascular;  Laterality: N/A;    Current Medications: Current Meds  Medication Sig   acetaminophen (TYLENOL) 325 MG tablet Take 2 tablets (650 mg total) by mouth every 6 (six) hours as needed for mild pain, fever or headache (or Fever >/= 101).   alendronate (FOSAMAX) 70 MG tablet Take 70 mg by mouth every Wednesday.   ALLERGY RELIEF 10 MG tablet Take 10 mg by mouth daily.   allopurinol (ZYLOPRIM) 300 MG tablet Take 300 mg by mouth daily.   aspirin EC 81 MG tablet Take 1 tablet (81 mg total) by mouth daily with breakfast.   atorvastatin (LIPITOR) 10 MG tablet Take 10 mg by mouth daily.   doxycycline (VIBRAMYCIN) 100 MG capsule Take 100 mg by mouth 2 (two) times daily.   ezetimibe (ZETIA) 10 MG tablet Take 10 mg by mouth daily.   famotidine (PEPCID) 20 MG tablet Take 1 tablet (20  mg total) by mouth 2 (two) times daily.   ferrous sulfate 325 (65 FE) MG tablet Take 325 mg by mouth daily with breakfast.   folic acid (FOLVITE) 1 MG tablet Take 1 mg by mouth daily.   furosemide (LASIX) 20 MG tablet Take 20 mg by mouth daily as needed for fluid or edema.   methotrexate 2.5 MG tablet Take 20 mg by mouth every Wednesday.    mupirocin ointment (BACTROBAN) 2 % as needed for rash.   nitroGLYCERIN (NITROSTAT) 0.4 MG SL tablet PLACE 1 TABLET UNDER THE TONGUE EVERY 5 MINUTES AS NEEDED FOR CHEST PAIN   pantoprazole (PROTONIX) 40 MG tablet Take 1 tablet (40 mg total) by mouth daily.   sucralfate (CARAFATE) 1 g tablet Take 1 g by mouth 2 (two) times daily.    verapamil (VERELAN PM) 360 MG 24 hr capsule Take 1 capsule (360 mg total) by mouth at bedtime.   Vitamin D, Cholecalciferol, 25 MCG (1000 UT) TABS Take 1 tablet by mouth daily.     Allergies:   Patient has no known allergies.   Social History   Socioeconomic History   Marital status: Widowed    Spouse name: Not on file   Number of children: Not on file   Years of education: Not on file   Highest education level: Not on file  Occupational History   Not on file  Tobacco Use   Smoking status: Never   Smokeless tobacco: Never  Vaping Use   Vaping Use: Never used  Substance and Sexual Activity   Alcohol use: No   Drug use: No   Sexual activity: Not on file  Other Topics Concern   Not on file  Social History Narrative   Not on file   Social Determinants of Health   Financial Resource Strain: Not on file  Food Insecurity: Not on file  Transportation Needs: Not on file  Physical Activity: Not on file  Stress: Not on file  Social Connections: Not on file    Social: One of her kids died from leukemia not HCM One Daughter ((diamond) and four sons)  Family History: The patient's family history includes Alcohol abuse in her father; Hypertension in her mother; Liver disease in her mother.  ROS:   Please see the history of present illness.     All other systems reviewed and are negative.  EKGs/Labs/Other Studies Reviewed:    The following studies were reviewed today:   CORONARY STENT INTERVENTION, CORONARY STENT INTERVENTION 03/03/2017  Narrative Images from the original result were not included.   Ost LM lesion, 40 %stenosed.  1st Mrg lesion, 90 %stenosed.  A STENT SIERRA 3.00 X 12 MM drug eluting stent was successfully placed.  Post intervention, there is a 0% residual stenosis.  Lat 1st Mrg lesion, 60 %stenosed. Bifurcation lesion jailed by the initial stent  Post intervention - rescue PTCA only, there is a 20% residual stenosis.  Successful bifurcation  PCI of large OM1 with major branch of OM1 PTCA. A Xience Sierra DES stent was used for the main branch and then PTCA was performed through the stent into the side branch for ostial stenosis.    NM Myocar Multi W/Spect W/Wall Motion / EF 01/30/2017  Narrative  This is a low risk study.  The left ventricular ejection fraction is mildly decreased (45-54%).  There was no ST segment deviation noted during stress.  Significant bowel artifact obscures some of the inferior and lateral wall No ischemia EF 54% low  risk study   No results found for this or any previous visit from the past 3650 days.   ECHO COMPLETE WITH IMAGING ENHANCING AGENT 08/01/2021  Narrative ECHOCARDIOGRAM REPORT    Patient Name:   LADANA CHAVERO Date of Exam: 08/01/2021 Medical Rec #:  [id number]    Height:       61.0 in Accession #:    [id number]   Weight:       185.0 lb Date of Birth:  11/22/1940    BSA:          1.827 m Patient Age:    80 years     BP:           128/66 mmHg Patient Gender: F            HR:           65 bpm. Exam Location:  Inpatient  Procedure: 2D Echo, Cardiac Doppler, Color Doppler and Intracardiac Opacification Agent  Indications:    Hypertrophic obstructive cardiomyopathy I42.1 Chest Pain R07.9  History:        Patient has prior history of Echocardiogram examinations, most recent 08/07/2019. CHF, CAD; Risk Factors:Hypertension, Dyslipidemia and Non-Smoker.  Sonographer:    Roosvelt Maser RDCS Referring Phys: [id number] Ellsworth Lennox  IMPRESSIONS   1. Dynamic LVOT gradient ~ 100 mmhg. Left ventricular ejection fraction, by estimation, is 65 to 70%. The left ventricle has normal function. The left ventricle has no regional wall motion abnormalities. There is severe asymmetric left ventricular hypertrophy of the basal segment. Left ventricular diastolic parameters are consistent with Grade I diastolic dysfunction (impaired relaxation). 2. Right ventricular systolic function is normal.  The right ventricular size is normal. There is normal pulmonary artery systolic pressure. 3. Mild mitral valve regurgitation. 4. Aortic valve regurgitation is not visualized. Aortic valve sclerosis/calcification is present, without any evidence of aortic stenosis. 5. The inferior vena cava is normal in size with greater than 50% respiratory variability, suggesting right atrial pressure of 3 mmHg.  Comparison(s): No significant change from prior study.  Conclusion(s)/Recommendation(s): Findings consistent with hypertrophic obstructive cardiomyopathy.   ECHO LIMITED WO IMAGE ENHANCING AGENT 10/06/2021 1. 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 The left ventricle has hyperdynamic function. There is severe asymmetric left ventricular hypertrophy. Left ventricular diastolic parameters are consistent with Grade II diastolic dysfunction (pseudonormalization). Elevated left atrial pressure. The average left ventricular global longitudinal strain is -18.3 %. The global longitudinal strain is normal. 2. Right ventricular systolic function is normal. The right ventricular size is normal. 3. Left atrial size was severely dilated. 4. Severe systolic anterior motion of the mitral valve with severe LV outflow obstruction and mitral insufficiency.. Moderate to severe mitral valve regurgitation. 5. The peak gradient due to dynamic outflow tract obstruction is approximately 80 mm hg at rest (Valsalva maneuver was not performed).. The aortic valve is tricuspid. There is mild calcification of the aortic valve. There is mild thickening of the aortic valve. Aortic valve regurgitation is trivial. Aortic valve sclerosis/calcification is present, without any evidence of aortic stenosis.    Cardiac event monitor    LONG TERM MONITOR (8-14 DAYS) HOOK UP AND INTERPRETATION 11/17/2021  Narrative  Patient had a  minimum heart rate of 37 bpm, maximum heart rate of 160 bpm, and average heart rate of 78 bpm.  Predominant underlying rhythm was sinus rhythm.  P-SVT 10 episodes lasting 10 beats at longest with a  max rate of 160 bpm at fastest.  Isolated PACs were rare (<1.0%).  Isolated PVCs were rare (<1.0%).  Two nocturnal Wenckebach episodes noted.  No triggered and diary events.   Recent Labs: 07/31/2021: B Natriuretic Peptide 406.0 08/03/2021: BUN 17; Creatinine, Ser 0.99; Potassium 4.3; Sodium 141 12/15/2021: Hemoglobin 11.2; Platelets 194  Recent Lipid Panel    Component Value Date/Time   CHOL 115 08/02/2021 0646   TRIG 87 08/02/2021 0646   HDL 46 08/02/2021 0646   CHOLHDL 2.5 08/02/2021 0646   VLDL 17 08/02/2021 0646   LDLCALC 52 08/02/2021 0646    Physical Exam:    VS:  BP 108/60   Pulse 84   Ht 5\' 2"  (1.575 m)   Wt 185 lb (83.9 kg)   SpO2 98%   BMI 33.84 kg/m     Wt Readings from Last 3 Encounters:  03/11/22 185 lb (83.9 kg)  02/01/22 177 lb 6.4 oz (80.5 kg)  12/22/21 177 lb 0.5 oz (80.3 kg)    Gen: no distress, elderly female   Neck: No JV Cardiac: No Rubs or Gallops, harsh systolic Murmur worse with hand grip and Valsalva, RRR and +2 radial pulses Respiratory: Clear to auscultation bilaterally, normal effort, normal  respiratory rate GI: Soft, nontender, non-distended  MS: trace bilateral edema;  moves all extremities Integument: Skin feels ok Neuro:  At time of evaluation, alert and oriented to person/place/time/situation  Psych: Normal affect, patient feels well   ASSESSMENT:    1. HOCM (hypertrophic obstructive cardiomyopathy) (HCC)   2. Coronary artery disease of native artery of native heart with stable angina pectoris (HCC)      PLAN:    Hypertrophic Cardiomyopathy CAD - Septal Variant; obstructive, limited evidence of small apical anerysm, preserved EF - with associated MR but no primary MR - NYHA I on 20 mg Lasix daily and verapamil 360 mg; would  not tolerate dual AV nodale therapy - we will get a PET MPI; she may have reduced stress flow and flow reserve because of her HCM; unelss she has evidence of obstructive CAD we will not pursue further ischemic testing - she does not want SRT; when we discussed medical therapy with CMI's she is amenable if her CP or dizziness get wors   - She has 2/24 Echo with Definity contrast for eval of LVOT gradient (pre contrast) and apical aneurysm.  Given age likely no ICD  Has genetic testing 07/2021 for evals of sons and daugther  December with me in [place] Needs to bring her medications on her trips  Time Spent Directly with Patient:   I have spent a total of 40 minutes  with the patient reviewing notes, imaging, EKGs, labs and examining the patient as well as establishing an assessment and plan that was discussed personally with the patient.  > 50% of time was spent in direct patient care and family (SIL John) .        Medication Adjustments/Labs and Tests Ordered: Current medicines are reviewed at length with the patient today.  Concerns regarding medicines are outlined above.  Orders Placed This Encounter  Procedures   NM PET CT CARDIAC PERFUSION MULTI W/ABSOLUTE BLOODFLOW   Cardiac Stress Test: Informed Consent Details: Physician/Practitioner Attestation; Transcribe to consent form and obtain patient signature   No orders of the defined types were placed in this encounter.   Patient Instructions  Medication Instructions:  Your physician recommends that you continue on your current medications as directed. Please refer to the  Current Medication list given to you today.  *If you need a refill on your cardiac medications before your next appointment, please call your pharmacy*   Lab Work: None If you have labs (blood work) drawn today and your tests are completely normal, you will receive your results only by: MyChart Message (if you have MyChart) OR A paper copy in the mail If you  have any lab test that is abnormal or we need to change your treatment, we will call you to review the results.   Testing/Procedures:  How to Prepare for Your Cardiac PET/CT Stress Test:  1. Please do not take these medications before your test:   Medications that may interfere with the cardiac pharmacological stress agent (ex. nitrates - including erectile dysfunction medications or beta-blockers) the day of the exam. (Erectile dysfunction medication should be held for at least 72 hrs prior to test) Theophylline containing medications for 12 hours. Dipyridamole 48 hours prior to the test. Your remaining medications may be taken with water.  2. Nothing to eat or drink, except water, 3 hours prior to arrival time.   NO caffeine/decaffeinated products, or chocolate 12 hours prior to arrival.  3. NO perfume, cologne or lotion  4. Total time is 1 to 2 hours; you may want to bring reading material for the waiting time.  5. Please report to Admitting at the [facility] Main Entrance 60 minutes early for your test.  [address] [place], [place] [address]  Diabetic Preparation:  Hold oral medications. You may take NPH and Lantus insulin. Do not take Humalog or Humulin R (Regular Insulin) the day of your test. Check blood sugars prior to leaving the house. If able to eat breakfast prior to 3 hour fasting, you may take all medications, including your insulin, Do not worry if you miss your breakfast dose of insulin - start at your next meal.  IF YOU THINK YOU MAY BE PREGNANT, OR ARE NURSING PLEASE INFORM THE TECHNOLOGIST.  In preparation for your appointment, medication and supplies will be purchased.  Appointment availability is limited, so if you need to cancel or reschedule, please call the Radiology Department at [phone]  24 hours in advance to avoid a cancellation fee of $100.00  What to Expect After you Arrive:  Once you arrive and check in for your appointment,  you will be taken to a preparation room within the Radiology Department.  A technologist or Nurse will obtain your medical history, verify that you are correctly prepped for the exam, and explain the procedure.  Afterwards,  an IV will be started in your arm and electrodes will be placed on your skin for EKG monitoring during the stress portion of the exam. Then you will be escorted to the PET/CT scanner.  There, staff will get you positioned on the scanner and obtain a blood pressure and EKG.  During the exam, you will continue to be connected to the EKG and blood pressure machines.  A small, safe amount of a radioactive tracer will be injected in your IV to obtain a series of pictures of your heart along with an injection of a stress agent.    After your Exam:  It is recommended that you eat a meal and drink a caffeinated beverage to counter act any effects of the stress agent.  Drink plenty of fluids for the remainder of the day and urinate frequently for the first couple of hours after the exam.  Your doctor will inform  you of your test results within 7-10 business days.  For questions about your test or how to prepare for your test, please call: Rockwell Alexandria, Cardiac Imaging Nurse Navigator  Larey Brick, Cardiac Imaging Nurse Navigator Office: [phone]    Follow-Up: At [facility], you and your health needs are our priority.  As part of our continuing mission to provide you with exceptional heart care, we have created designated Provider Care Teams.  These Care Teams include your primary Cardiologist (physician) and Advanced Practice Providers (APPs -  Physician Assistants and Nurse Practitioners) who all work together to provide you with the care you need, when you need it.  We recommend signing up for the patient portal called "MyChart".  Sign up information is provided on this After Visit Summary.  MyChart is used to connect with patients for Virtual Visits (Telemedicine).   Patients are able to view lab/test results, encounter notes, upcoming appointments, etc.  Non-urgent messages can be sent to your provider as well.   To learn more about what you can do with MyChart, go to [email or web address].    Your next appointment:   December   The format for your next appointment:   In Person  Provider:   Dr. Izora Ribas  Other Instructions None  Important Information About Sugar        Signed, Christell Constant, MD  03/11/2022 1:35 PM    [facility]

## 2022-04-07 DIAGNOSIS — N1831 Chronic kidney disease, stage 3a: Secondary | ICD-10-CM | POA: Insufficient documentation

## 2022-05-03 ENCOUNTER — Other Ambulatory Visit (HOSPITAL_COMMUNITY): Payer: Self-pay | Admitting: Family Medicine

## 2022-05-03 DIAGNOSIS — Z1231 Encounter for screening mammogram for malignant neoplasm of breast: Secondary | ICD-10-CM

## 2022-05-06 ENCOUNTER — Telehealth (HOSPITAL_COMMUNITY): Payer: Self-pay | Admitting: Emergency Medicine

## 2022-05-06 NOTE — Telephone Encounter (Signed)
Attempted to call patient regarding upcoming cardiac PET appointment. Left message on voicemail with name and callback number Raydin Bielinski RN Navigator Cardiac Imaging [facility] [phone] Office [phone] Cell  

## 2022-05-09 ENCOUNTER — Telehealth (HOSPITAL_COMMUNITY): Payer: Self-pay | Admitting: Emergency Medicine

## 2022-05-09 NOTE — Telephone Encounter (Signed)
Reaching out to patient to offer assistance regarding upcoming cardiac imaging study; pt verbalizes understanding of appt date/time, parking situation and where to check in, pre-test NPO status and medications ordered, and verified current allergies; name and call back number provided for further questions should they arise Rockwell Alexandria RN Navigator Cardiac Imaging [facility] [phone] office [phone] cell  Speaks samoan Son to accompany patient Holding caffeine 12 h Holding food 3h

## 2022-05-10 ENCOUNTER — Encounter (HOSPITAL_COMMUNITY)
Admission: RE | Admit: 2022-05-10 | Discharge: 2022-05-10 | Disposition: A | Discharge status: Home / Self Care | Payer: Medicare Other | Source: Ambulatory Visit | Attending: Internal Medicine | Admitting: Internal Medicine

## 2022-05-10 ENCOUNTER — Encounter (HOSPITAL_COMMUNITY): Payer: Self-pay

## 2022-05-10 DIAGNOSIS — I421 Obstructive hypertrophic cardiomyopathy: Secondary | ICD-10-CM | POA: Diagnosis present

## 2022-05-10 LAB — NM PET CT CARDIAC PERFUSION MULTI W/ABSOLUTE BLOODFLOW
MBFR: 1.29
Nuc Rest EF: 55 %
Nuc Stress EF: 60 %
Rest MBF: 0.84 ml/g/min
ST Depression (mm): 0 mm
Stress MBF: 1.08 ml/g/min
TID: 1.1

## 2022-05-10 MED ORDER — RUBIDIUM RB82 GENERATOR (RUBYFILL)
21.7000 | PACK | Freq: Once | INTRAVENOUS | Status: AC
  Administered 2022-05-10: 21.7 via INTRAVENOUS

## 2022-05-10 MED ORDER — REGADENOSON 0.4 MG/5ML IV SOLN: 0.4000 mg | Freq: Once | INTRAVENOUS | Status: AC

## 2022-05-10 MED ORDER — REGADENOSON 0.4 MG/5ML IV SOLN
INTRAVENOUS | Status: AC
  Administered 2022-05-10: 0.4 mg via INTRAVENOUS
  Filled 2022-05-10: qty 5

## 2022-05-10 NOTE — Progress Notes (Signed)
Patient arrived to [facility] PET with steady gait following all insructions. Accompanied by son who helps translate Samoan language No caffeine No food

## 2022-05-10 NOTE — Progress Notes (Signed)
Pt tolerated exam without incident; pt denies lightheadedness or dizziness; pt ambulatory to lobby steady gait noted  

## 2022-05-23 ENCOUNTER — Ambulatory Visit (HOSPITAL_COMMUNITY)
Admission: RE | Admit: 2022-05-23 | Discharge: 2022-05-23 | Disposition: A | Discharge status: Home / Self Care | Payer: Medicare Other | Source: Ambulatory Visit | Attending: Family Medicine | Admitting: Family Medicine

## 2022-05-23 DIAGNOSIS — Z1231 Encounter for screening mammogram for malignant neoplasm of breast: Secondary | ICD-10-CM | POA: Insufficient documentation

## 2022-06-08 ENCOUNTER — Ambulatory Visit: Payer: Medicare Other | Admitting: Internal Medicine

## 2022-06-23 ENCOUNTER — Inpatient Hospital Stay: Payer: Medicare Other | Attending: Hematology

## 2022-06-23 DIAGNOSIS — D72819 Decreased white blood cell count, unspecified: Secondary | ICD-10-CM | POA: Diagnosis present

## 2022-06-23 DIAGNOSIS — D509 Iron deficiency anemia, unspecified: Secondary | ICD-10-CM | POA: Diagnosis not present

## 2022-06-23 DIAGNOSIS — I110 Hypertensive heart disease with heart failure: Secondary | ICD-10-CM | POA: Insufficient documentation

## 2022-06-23 DIAGNOSIS — I5030 Unspecified diastolic (congestive) heart failure: Secondary | ICD-10-CM | POA: Diagnosis not present

## 2022-06-23 DIAGNOSIS — D649 Anemia, unspecified: Secondary | ICD-10-CM

## 2022-06-23 LAB — COMPREHENSIVE METABOLIC PANEL
ALT: 14 U/L (ref 0–44)
AST: 28 U/L (ref 15–41)
Albumin: 3.4 g/dL — ABNORMAL LOW (ref 3.5–5.0)
Alkaline Phosphatase: 62 U/L (ref 38–126)
Anion gap: 7 (ref 5–15)
BUN: 12 mg/dL (ref 8–23)
CO2: 24 mmol/L (ref 22–32)
Calcium: 8.5 mg/dL — ABNORMAL LOW (ref 8.9–10.3)
Chloride: 110 mmol/L (ref 98–111)
Creatinine, Ser: 0.75 mg/dL (ref 0.44–1.00)
GFR, Estimated: >60 mL/min (ref >60)
Glucose, Bld: 125 mg/dL — ABNORMAL HIGH (ref 70–99)
Potassium: 3.2 mmol/L — ABNORMAL LOW (ref 3.5–5.1)
Sodium: 141 mmol/L (ref 135–145)
Total Bilirubin: 1 mg/dL (ref 0.3–1.2)
Total Protein: 6.8 g/dL (ref 6.5–8.1)

## 2022-06-23 LAB — IRON AND TIBC
Iron: 73 ug/dL (ref 28–170)
Saturation Ratios: 25 % (ref 10.4–31.8)
TIBC: 289 ug/dL (ref 250–450)
UIBC: 216 ug/dL

## 2022-06-23 LAB — CBC WITH DIFFERENTIAL/PLATELET
Abs Immature Granulocytes: 0.04 10*3/uL (ref 0.00–0.07)
Basophils Absolute: 0 10*3/uL (ref 0.0–0.1)
Basophils Relative: 1 %
Eosinophils Absolute: 0.1 10*3/uL (ref 0.0–0.5)
Eosinophils Relative: 3 %
HCT: 35.4 % — ABNORMAL LOW (ref 36.0–46.0)
Hemoglobin: 11.4 g/dL — ABNORMAL LOW (ref 12.0–15.0)
Immature Granulocytes: 1 %
Lymphocytes Relative: 17 %
Lymphs Abs: 0.7 10*3/uL (ref 0.7–4.0)
MCH: 30.9 pg (ref 26.0–34.0)
MCHC: 32.2 g/dL (ref 30.0–36.0)
MCV: 95.9 fL (ref 80.0–100.0)
Monocytes Absolute: 0.4 10*3/uL (ref 0.1–1.0)
Monocytes Relative: 9 %
Neutro Abs: 2.8 10*3/uL (ref 1.7–7.7)
Neutrophils Relative %: 69 %
Platelets: 199 10*3/uL (ref 150–400)
RBC: 3.69 MIL/uL — ABNORMAL LOW (ref 3.87–5.11)
RDW: 15.5 % (ref 11.5–15.5)
WBC: 4.1 10*3/uL (ref 4.0–10.5)
nRBC: 0 % (ref 0.0–0.2)

## 2022-06-23 LAB — VITAMIN B12: Vitamin B-12: 626 pg/mL (ref 180–914)

## 2022-06-23 LAB — FERRITIN: Ferritin: 70 ng/mL (ref 11–307)

## 2022-06-23 LAB — FOLATE: Folate: 14.3 ng/mL (ref >5.9)

## 2022-06-24 LAB — KAPPA/LAMBDA LIGHT CHAINS
Kappa free light chain: 43.2 mg/L — ABNORMAL HIGH (ref 3.3–19.4)
Kappa, lambda light chain ratio: 1.44 (ref 0.26–1.65)
Lambda free light chains: 30.1 mg/L — ABNORMAL HIGH (ref 5.7–26.3)

## 2022-06-27 LAB — METHYLMALONIC ACID, SERUM: Methylmalonic Acid, Quantitative: 213 nmol/L (ref 0–378)

## 2022-06-28 LAB — IMMUNOFIXATION ELECTROPHORESIS
IgA: 351 mg/dL (ref 64–422)
IgG (Immunoglobin G), Serum: 1311 mg/dL (ref 586–1602)
IgM (Immunoglobulin M), Srm: 121 mg/dL (ref 26–217)
Total Protein ELP: 6.4 g/dL (ref 6.0–8.5)

## 2022-06-28 LAB — PROTEIN ELECTROPHORESIS, SERUM
A/G Ratio: 1.2 (ref 0.7–1.7)
Albumin ELP: 3.5 g/dL (ref 2.9–4.4)
Alpha-1-Globulin: 0.3 g/dL (ref 0.0–0.4)
Alpha-2-Globulin: 0.5 g/dL (ref 0.4–1.0)
Beta Globulin: 0.9 g/dL (ref 0.7–1.3)
Gamma Globulin: 1.3 g/dL (ref 0.4–1.8)
Globulin, Total: 2.9 g/dL (ref 2.2–3.9)
Total Protein ELP: 6.4 g/dL (ref 6.0–8.5)

## 2022-06-29 LAB — COPPER, SERUM: Copper: 118 ug/dL (ref 80–158)

## 2022-06-29 NOTE — Progress Notes (Unsigned)
VIRTUAL VISIT via [facility] connected with Shailynn Fandrich  on 06/30/22 at  8:41 AM by video and verified that I am speaking with the correct person using two identifiers.  Location: Patient: Dana [name] Provider: Home Office   I discussed the limitations, risks, security and privacy concerns of performing an evaluation and management service by virtual/video visit and the availability of in person appointments. I also discussed with the patient that there may be a patient responsible charge related to this service. The patient expressed understanding and agreed to proceed.  PCP:  [name], MD [address] [place] [address] [phone]  REASON FOR VISIT:  Follow-up for evaluation of mild leukopenia  PRIOR THERAPY: None  CURRENT THERAPY: Surveillance  INTERVAL HISTORY:  Ms. Dana [name] 82 y.o. female returns for routine follow-up of leukopenia.  She was last seen by Tarri Abernethy PA-C on 12/22/2021.  **NOTE:  Patient's primary language is Samoan - she was offered interpreter services via telephone, but she declined and indicated that she would like her son-in-law Jenny Reichmann, who is present during visit, to act as interpreter for her.  At today's visit, she reports feeling.  She has not had any hospitalizations since her last visit.  No new diagnoses or changes to her baseline health status.    Since her last visit, she has not had any infections.  She denies any B symptoms such as fever, chills, night sweats, or unexplained weight loss.  She has not noticed any new lumps or bumps.  She remains on methotrexate 20 mg weekly for her rheumatoid arthritis.  She denies any signs of bleeding such as rectal bleeding or melena.  She has 80% energy and 100% appetite.   She endorses that she is maintaining a stable weight.   REVIEW OF SYSTEMS:   Patient denies any acute complaints at today's visit. Review of Systems   Constitutional:  Negative for appetite change, chills, diaphoresis, fatigue, fever and unexpected weight change.  HENT:   Negative for lump/mass and nosebleeds.   Eyes:  Negative for eye problems.  Respiratory:  Negative for cough, hemoptysis and shortness of breath.   Cardiovascular:  Negative for chest pain, leg swelling and palpitations.  Gastrointestinal:  Negative for abdominal pain, blood in stool, constipation, diarrhea, nausea and vomiting.  Genitourinary:  Negative for hematuria.   Skin: Negative.   Neurological:  Negative for dizziness, headaches and light-headedness.  Hematological:  Does not bruise/bleed easily.      PAST MEDICAL/SURGICAL HISTORY:  Past Medical History:  Diagnosis Date   CAD (coronary artery disease)    a. s/p DES to OM1 in 02/2017   Chest pain    Essential hypertension    Gout    HOCM (hypertrophic obstructive cardiomyopathy) ([facility])    Hyperlipidemia    Rheumatoid arthritis ([place])    Past Surgical History:  Procedure Laterality Date   CHOLECYSTECTOMY N/A 08/17/2018   Procedure: LAPAROSCOPIC CHOLECYSTECTOMY;  Surgeon: Aviva Signs, MD;  Location: [facility];  Service: General;  Laterality: N/A;   CORONARY BALLOON ANGIOPLASTY N/A 03/03/2017   Procedure: CORONARY BALLOON ANGIOPLASTY;  Surgeon: Leonie Man, MD;  Location: [facility];  Service: Cardiovascular;  Laterality: N/A;   CORONARY STENT INTERVENTION N/A 03/03/2017   Procedure: CORONARY STENT INTERVENTION;  Surgeon: Leonie Man, MD;  Location: [facility];  Service: Cardiovascular;  Laterality: N/A;   LEFT HEART CATH AND CORONARY ANGIOGRAPHY N/A 03/02/2017  Procedure: LEFT HEART CATH AND CORONARY ANGIOGRAPHY;  Surgeon: Sherren Mocha, MD;  Location: [facility];  Service: Cardiovascular;  Laterality: N/A;     SOCIAL HISTORY:  Social History   Socioeconomic History   Marital status: Widowed    Spouse name: Not on file   Number of children: Not on file   Years of  education: Not on file   Highest education level: Not on file  Occupational History   Not on file  Tobacco Use   Smoking status: Never   Smokeless tobacco: Never  Vaping Use   Vaping Use: Never used  Substance and Sexual Activity   Alcohol use: No   Drug use: No   Sexual activity: Not on file  Other Topics Concern   Not on file  Social History Narrative   Not on file   Social Determinants of Health   Financial Resource Strain: Not on file  Food Insecurity: Not on file  Transportation Needs: Not on file  Physical Activity: Not on file  Stress: Not on file  Social Connections: Not on file  Intimate Partner Violence: Not on file    FAMILY HISTORY:  Family History  Problem Relation Age of Onset   Hypertension Mother    Liver disease Mother    Alcohol abuse Father     CURRENT MEDICATIONS:  Outpatient Encounter Medications as of 06/30/2022  Medication Sig   acetaminophen (TYLENOL) 325 MG tablet Take 2 tablets (650 mg total) by mouth every 6 (six) hours as needed for mild pain, fever or headache (or Fever >/= 101).   alendronate (FOSAMAX) 70 MG tablet Take 70 mg by mouth every Wednesday.   ALLERGY RELIEF 10 MG tablet Take 10 mg by mouth daily.   allopurinol (ZYLOPRIM) 300 MG tablet Take 300 mg by mouth daily.   aspirin EC 81 MG tablet Take 1 tablet (81 mg total) by mouth daily with breakfast.   atorvastatin (LIPITOR) 10 MG tablet Take 10 mg by mouth daily.   doxycycline (VIBRAMYCIN) 100 MG capsule Take 100 mg by mouth 2 (two) times daily.   ezetimibe (ZETIA) 10 MG tablet Take 10 mg by mouth daily.   famotidine (PEPCID) 20 MG tablet Take 1 tablet (20 mg total) by mouth 2 (two) times daily.   ferrous sulfate 325 (65 FE) MG tablet Take 325 mg by mouth daily with breakfast.   folic acid (FOLVITE) 1 MG tablet Take 1 mg by mouth daily.   furosemide (LASIX) 20 MG tablet Take 20 mg by mouth daily as needed for fluid or edema.   methotrexate 2.5 MG tablet Take 20 mg by mouth every  Wednesday.    mupirocin ointment (BACTROBAN) 2 % as needed for rash.   nitroGLYCERIN (NITROSTAT) 0.4 MG SL tablet PLACE 1 TABLET UNDER THE TONGUE EVERY 5 MINUTES AS NEEDED FOR CHEST PAIN   pantoprazole (PROTONIX) 40 MG tablet Take 1 tablet (40 mg total) by mouth daily.   sucralfate (CARAFATE) 1 g tablet Take 1 g by mouth 2 (two) times daily.   verapamil (VERELAN PM) 360 MG 24 hr capsule Take 1 capsule (360 mg total) by mouth at bedtime.   Vitamin D, Cholecalciferol, 25 MCG (1000 UT) TABS Take 1 tablet by mouth daily.   No facility-administered encounter medications on file as of 06/30/2022.    ALLERGIES:  No Known Allergies   PHYSICAL EXAM (per limitations of video visit): The patient is alert and oriented x 3, exhibiting adequate mentation, good mood, and ability  to speak in full sentences and execute sound judgement.  *NOTE: Since patient was present in clinic (provider off-site), patient's vital signs and weight were obtained by nursing staff at the time of this visit.    06/30/2022    8:05 AM 05/10/2022    9:03 AM 05/10/2022    9:01 AM  Vitals with BMI  Weight 175 lbs 1 oz    Systolic [id number]  Diastolic 72 56 54  Pulse 69 65 65       LABORATORY DATA:  I have reviewed the labs as listed.  CBC    Component Value Date/Time   WBC 4.1 06/23/2022 1110   RBC 3.69 (L) 06/23/2022 1110   HGB 11.4 (L) 06/23/2022 1110   HCT 35.4 (L) 06/23/2022 1110   PLT 199 06/23/2022 1110   MCV 95.9 06/23/2022 1110   MCH 30.9 06/23/2022 1110   MCHC 32.2 06/23/2022 1110   RDW 15.5 06/23/2022 1110   LYMPHSABS 0.7 06/23/2022 1110   MONOABS 0.4 06/23/2022 1110   EOSABS 0.1 06/23/2022 1110   BASOSABS 0.0 06/23/2022 1110      Latest Ref Rng & Units 06/23/2022   11:10 AM 08/03/2021    4:13 AM 08/02/2021    6:46 AM  CMP  Glucose 70 - 99 mg/dL 125  100  95   BUN 8 - 23 mg/dL 12  17  19    Creatinine 0.44 - 1.00 mg/dL 0.75  0.99  0.83   Sodium 135 - 145 mmol/L 141  141  140   Potassium 3.5 -  5.1 mmol/L 3.2  4.3  3.8   Chloride 98 - 111 mmol/L 110  106  102   CO2 22 - 32 mmol/L 24  27  28    Calcium 8.9 - 10.3 mg/dL 8.5  8.9  8.3   Total Protein 6.5 - 8.1 g/dL 6.8     Total Bilirubin 0.3 - 1.2 mg/dL 1.0     Alkaline Phos 38 - 126 U/L 62     AST 15 - 41 U/L 28     ALT 0 - 44 U/L 14       DIAGNOSTIC IMAGING:  I have independently reviewed the relevant imaging and discussed with the patient.  ASSESSMENT & PLAN: 1.  Leukopenia - Here at the request of her primary care provider (NP Blenda Nicely) for the evaluation of leukopenia - Lab records sent by PCP included CBC (06/11/2021) with mildly low WBC 3.3 with normal differential, normal Hgb 11.7/MCV 97, normal platelets 238.  Prior labs (03/19/2021) show WBC 3.3 with normal differential.  Before that, patient had normal WBC 4.1 (07/08/2020). - She denies any frequent infections or B symptoms    - No lymphadenopathy or hepatosplenomegaly on exam.     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 Kathlene November in [place]), as well as daily folic acid supplement.   - Differential diagnosis favors myelosuppression from methotrexate, but also includes early MDS or benign ethnic neutropenia. - PLAN: Repeat labs and RTC in 12 months.   - If she develops any significant neutropenia  or other cytopenias in the future, we will speak with Dr. Kathlene November (rheumatology in [place]) and see if we can decrease the dose of her  methotrexate.  2.  Iron deficiency anemia - Being managed by primary care provider - Currently taking ferrous sulfate 325 mg daily - No rectal bleeding or melena - Most recent labs sent by PCP (03/19/2021): Show ferritin 165, iron saturation 59%, and TIBC 271 - Most recent CBC (06/23/2022): Hgb 11.4/MCV 95.9 - Additional anemia workup (06/23/2022): Normal B12, folate, MMA, copper.  CMP shows normal kidney function.  Ferritin 70 with iron saturation 25%.  Normal SPEP and immunofixation.  Mild elevations in kappa and lambda light chains, but with normal ratio 1.44. - DIFFERENTIAL DIAGNOSIS: Suspect anemia of chronic disease versus anemia secondary to methotrexate.  May also represent early MDS. - PLAN: Continue iron tablet daily as prescribed by PCP - Continue monitoring of anemia with repeat CBC in 12 months   3.  Other history - PMH:  Hypertrophic obstructive cardiomyopathy, hypertension, CAD s/p stents, diastolic congestive heart failure, gouty arthritis, hyperlipidemia, GERD, iron deficiency anemia, and rheumatoid arthritis - SOCIAL: She lives at home.  She worked as a Agricultural engineer.  No tobacco, alcohol, illicit drug use. - FAMILY: No known blood disorders or malignancy in the patient's family.   PLAN SUMMARY: >> Labs in 1 year (CBC/D, ferritin, iron/TIBC, CMP, B12, MMA, folate) >> OFFICE visit 1 week after labs   I discussed the assessment and treatment plan with the patient. The patient was provided an opportunity to ask questions and all were answered. The patient agreed with the plan and demonstrated an understanding of the instructions.   The patient was advised to call or seek an in-person evaluation if the symptoms worsen or if the condition fails to improve as anticipated.  Medical decision making: Low  Time spent on visit: I spent 23 minutes in preparation for visit and in counseling patient via virtual video visit.  Harriett Rush, PA-C 06/30/22 8:53 AM

## 2022-06-30 ENCOUNTER — Inpatient Hospital Stay: Payer: Medicare Other | Attending: Physician Assistant | Admitting: Physician Assistant

## 2022-06-30 ENCOUNTER — Other Ambulatory Visit: Payer: Self-pay

## 2022-06-30 VITALS — BP 134/72 | HR 69 | Temp 98.1°F | Resp 17 | Wt 175.0 lb

## 2022-06-30 DIAGNOSIS — D72819 Decreased white blood cell count, unspecified: Secondary | ICD-10-CM | POA: Diagnosis not present

## 2022-06-30 DIAGNOSIS — D649 Anemia, unspecified: Secondary | ICD-10-CM

## 2022-06-30 NOTE — Patient Instructions (Signed)
[facility] at [facility] Discharge Instructions  You were seen today by Tarri Abernethy PA-C for your mild anemia and your low white blood cells.  Your anemia and low white blood cells are most likely due to your methotrexate medication.  However, since your blood levels are only mildly low, you can CONTINUE to take your methotrexate at the current dose.  I would still like to monitor your blood counts at least once a year, since mild anemia and low white blood cells could also be a sign of other blood conditions.  You can make an appointment for sooner than 1 year if needed.  Continue taking iron tablet once daily.  FOLLOW-UP APPOINTMENT: We will check your labs again in 1 year and I will see you for an office visit 1 week after labs.   - - - - - - - - - - - - - - - - - -    Thank you for choosing [facility] at [facility] to provide your oncology and hematology care.  To afford each patient quality time with our provider, please arrive at least 15 minutes before your scheduled appointment time.   If you have a lab appointment with the [facility] please come in thru the Main Entrance and check in at the main information desk.  You need to re-schedule your appointment should you arrive 10 or more minutes late.  We strive to give you quality time with our providers, and arriving late affects you and other patients whose appointments are after yours.  Also, if you no show three or more times for appointments you may be dismissed from the clinic at the providers discretion.     Again, thank you for choosing [facility].  Our hope is that these requests will decrease the amount of time that you wait before being seen by our physicians.       _____________________________________________________________  Should you have questions after your visit to [facility], please contact our office at [phone] and follow the  prompts.  Our office hours are 8:00 a.m. and 4:30 p.m. Monday - Friday.  Please note that voicemails left after 4:00 p.m. may not be returned until the following business day.  We are closed weekends and major holidays.  You do have access to a nurse 24-7, just call the main number to the clinic [phone] and do not press any options, hold on the line and a nurse will answer the phone.    For prescription refill requests, have your pharmacy contact our office and allow 72 hours.    Due to Covid, you will need to wear a mask upon entering the hospital. If you do not have a mask, a mask will be given to you at the Main Entrance upon arrival. For doctor visits, patients may have 1 support person age 75 or older with them. For treatment visits, patients can not have anyone with them due to social distancing guidelines and our immunocompromised population.

## 2022-07-26 ENCOUNTER — Ambulatory Visit: Payer: Medicare Other | Admitting: Internal Medicine

## 2022-07-30 IMAGING — CT CT ANGIO CHEST
2 of 6 series · 17 of 46 positions shown · IV contrast (agent unspecified)
Comparison: 01/29/2017

CLINICAL DATA: Pulmonary embolus suspected with high probability.
Mid chest pain starting 3 hours ago. Shortness of breath.

EXAM:
CT ANGIOGRAPHY CHEST WITH CONTRAST
TECHNIQUE: Multidetector CT imaging of the chest was performed using the
standard protocol during bolus administration of intravenous
contrast. Multiplanar CT image reconstructions and MIPs were
obtained to evaluate the vascular anatomy.

[Series 6: pe axial thins · axial · 0.63mm/px · z∈[+874,+1114]mm · 14 of 328 slices shown]
[im 14/328  lung]
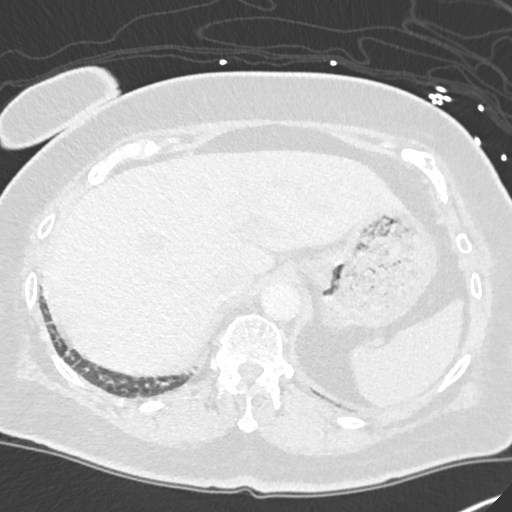
[im 41/328  soft-tissue]
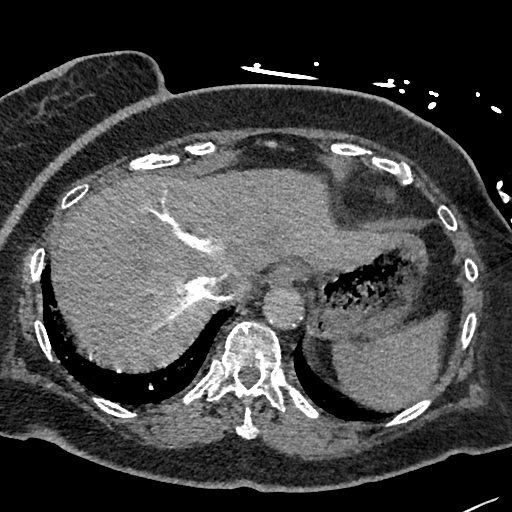
[im 69/328  lung]
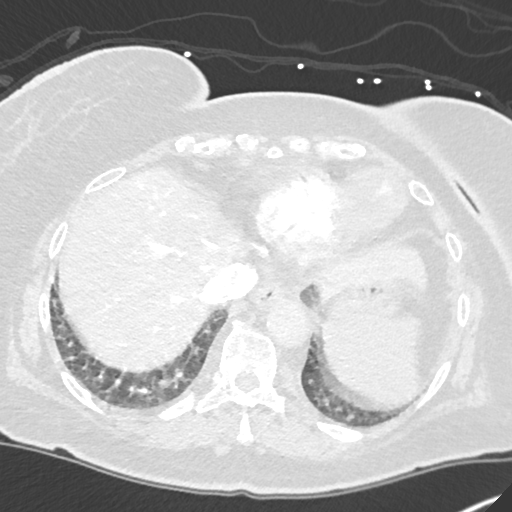
[im 82/328  soft-tissue]
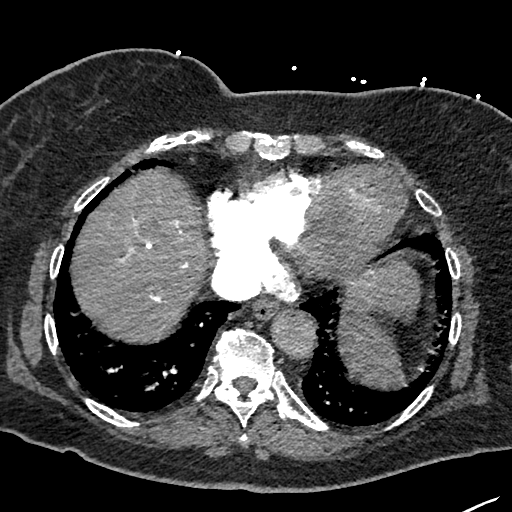
[im 110/328  lung]
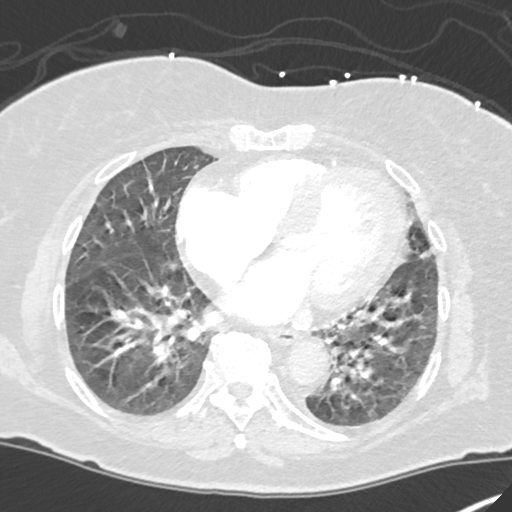
[im 137/328  soft-tissue]
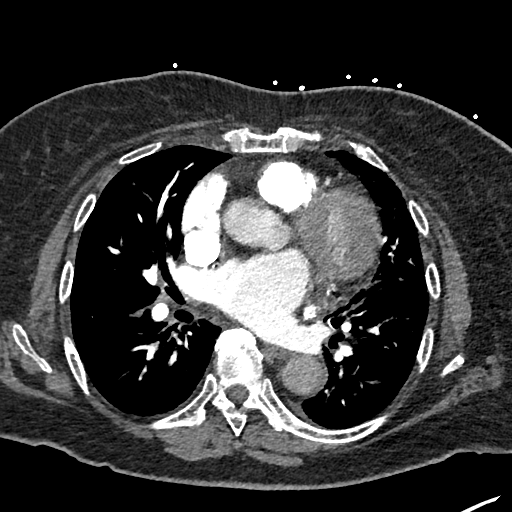
[im 150/328  lung]
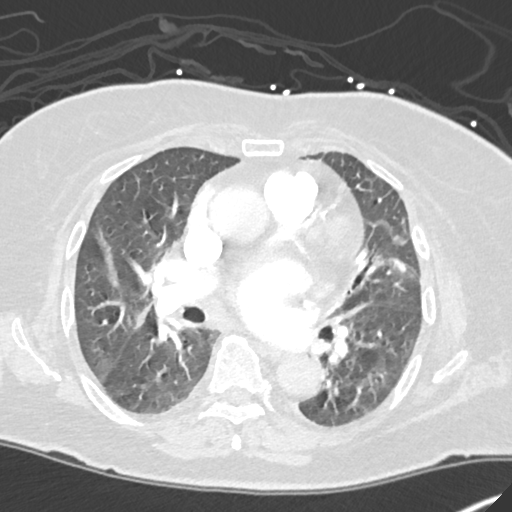
[im 178/328  soft-tissue]
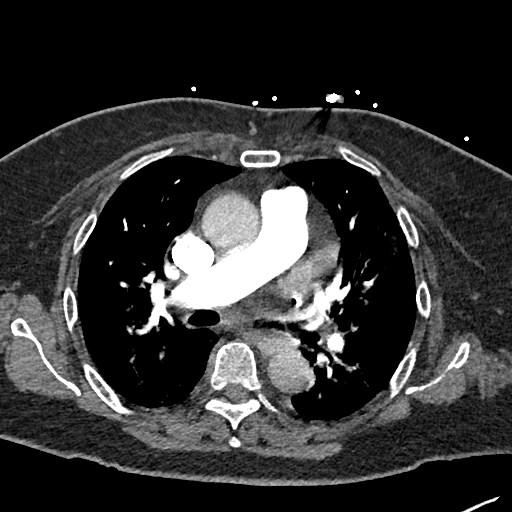
[im 191/328  lung]
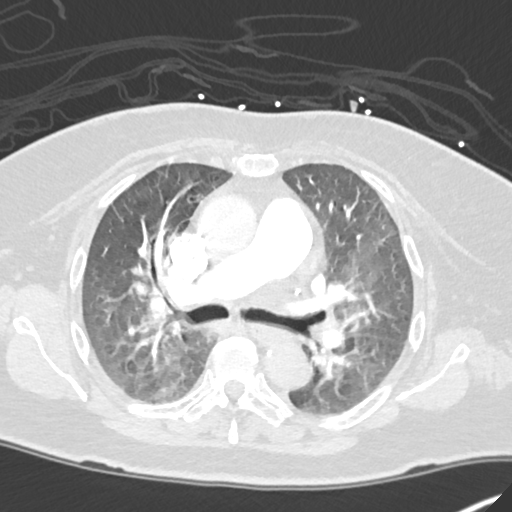
[im 219/328  soft-tissue]
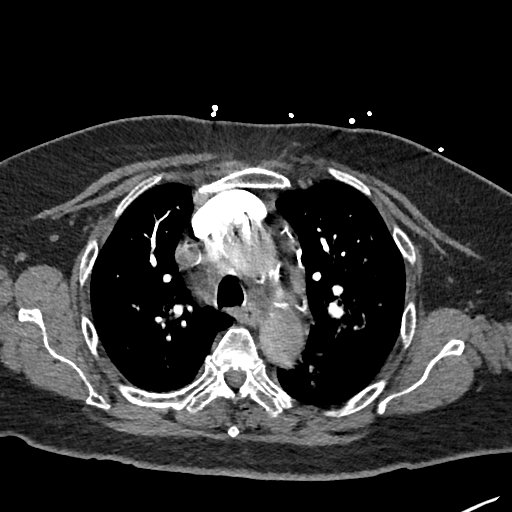
[im 246/328  lung]
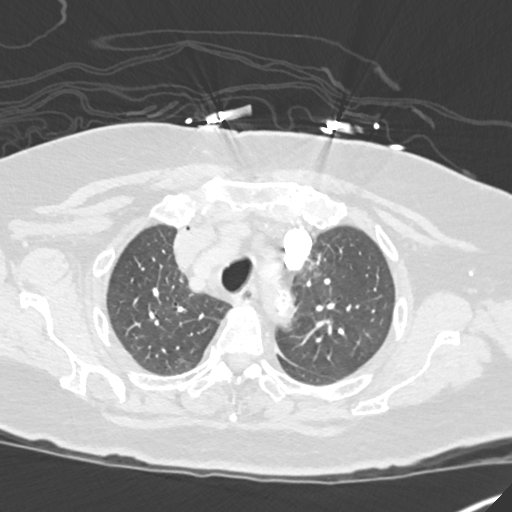
[im 259/328  soft-tissue]
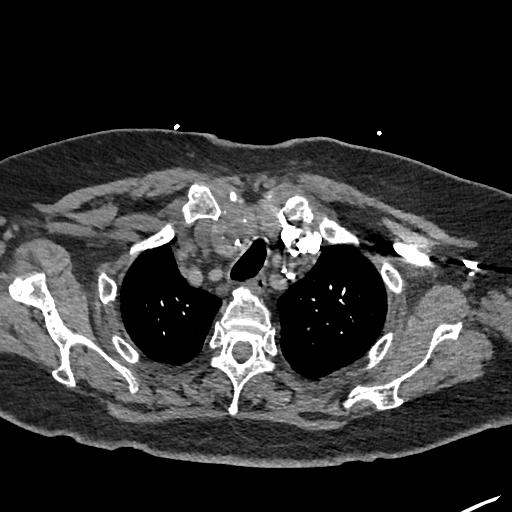
[im 287/328  lung]
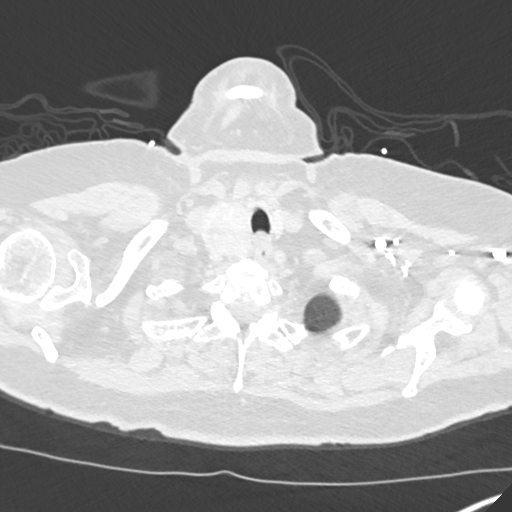
[im 314/328  soft-tissue]
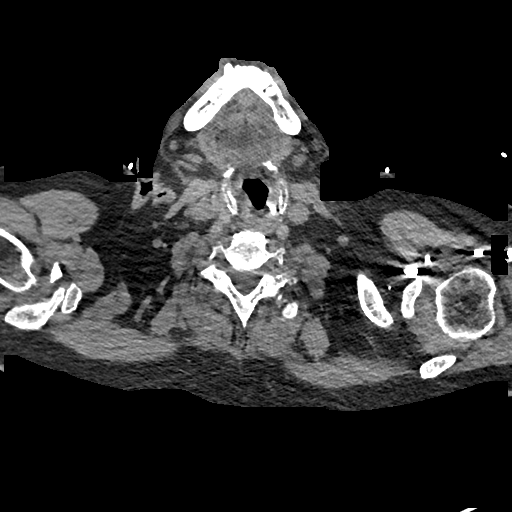

[Series 8: cor soft · coronal · 0.52mm/px · 3 of 134 slices shown]
[im 34/134  soft-tissue]
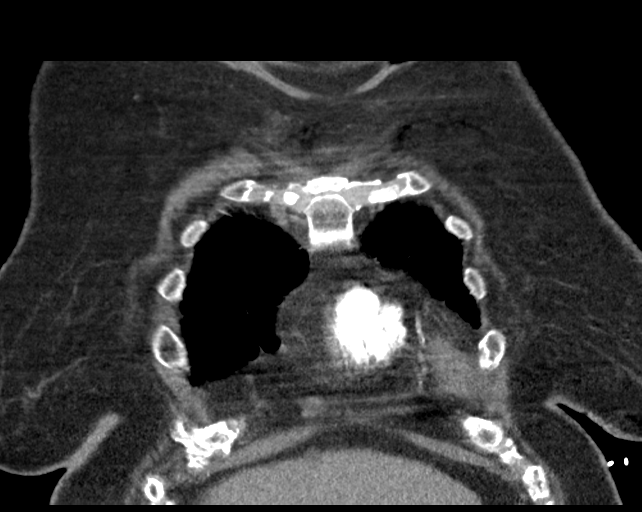
[im 67/134  soft-tissue]
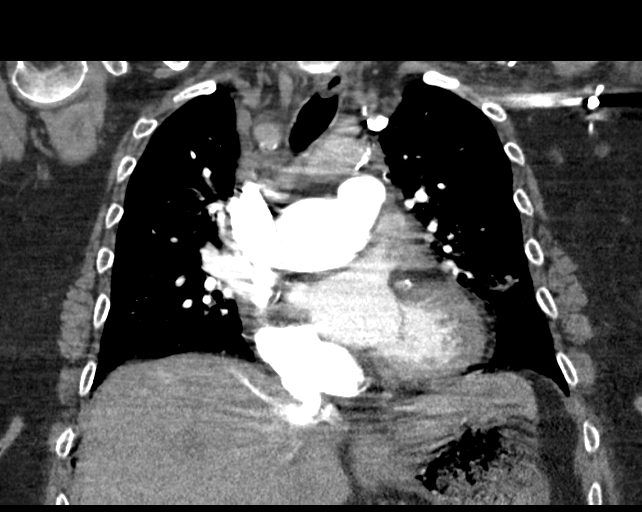
[im 100/134  soft-tissue]
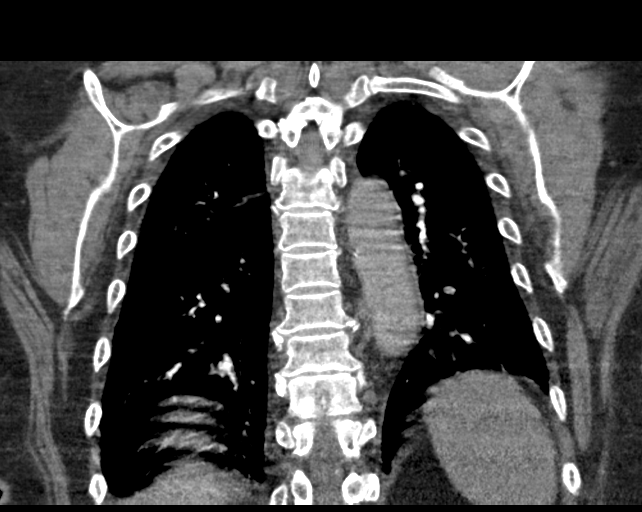

[17 of 46 positions shown; findings below may reference images not displayed]

RADIATION DOSE REDUCTION: This exam was performed according to the
departmental dose-optimization program which includes automated
exposure control, adjustment of the mA and/or kV according to
patient size and/or use of iterative reconstruction technique.

CONTRAST:  75mL OMNIPAQUE IOHEXOL 350 MG/ML SOLN
FINDINGS: Cardiovascular: Motion artifact limits the examination. There is
good opacification of the central and segmental pulmonary arteries.
No focal filling defects. No evidence of significant pulmonary
embolus. Cardiac enlargement. No pericardial effusions. Reflux of
contrast material into the hepatic veins suggest right heart
failure. Normal caliber thoracic aorta with scattered calcification.
No aneurysm. Great vessel origins are patent.

Mediastinum/Nodes: Enlarged right thyroid gland with multinodular
appearance measuring 3.1 cm in diameter. No change since prior
study. Long-term stability is consistent with benign etiology. No
significant lymphadenopathy. Esophagus is decompressed.

Lungs/Pleura: Motion artifact limits examination. Atelectasis in the
lung bases. No focal consolidation. Scattered nodules in the upper
lungs, largest measuring 2 mm in diameter. No pleural effusions. No
pneumothorax. Airways are patent.

Upper Abdomen: No acute abnormalities demonstrated.

Musculoskeletal: Degenerative changes in the spine.

Review of the MIP images confirms the above findings.
IMPRESSION: 1. No evidence of significant pulmonary embolus.
2. Cardiac enlargement with evidence of right heart failure.
3. No evidence of active pulmonary disease.
4. 2 mm nodules in the lungs. No follow-up needed if patient is
low-risk (and has no known or suspected primary neoplasm).
Non-contrast chest CT can be considered in 12 months if patient is
high-risk. This recommendation follows the consensus statement:
Guidelines for Management of Incidental Pulmonary Nodules Detected
[DATE].
5. Nodular enlargement of the right thyroid gland, unchanged since
prior study. Stability for greater than 5 years implies benignity;
no biopsy or followup indicated (ref: [HOSPITAL]. 3489

## 2022-08-02 ENCOUNTER — Other Ambulatory Visit: Payer: Self-pay | Admitting: Internal Medicine

## 2022-08-04 ENCOUNTER — Other Ambulatory Visit (HOSPITAL_COMMUNITY): Payer: Medicare Other

## 2022-08-09 ENCOUNTER — Encounter: Payer: Medicare Other | Admitting: Genetic Counselor

## 2022-08-11 ENCOUNTER — Ambulatory Visit: Payer: Medicare Other | Admitting: Internal Medicine

## 2022-08-16 ENCOUNTER — Emergency Department (HOSPITAL_COMMUNITY): Payer: Medicare Other

## 2022-08-16 ENCOUNTER — Emergency Department (HOSPITAL_COMMUNITY)
Admission: EM | Admit: 2022-08-16 | Discharge: 2022-08-16 | Disposition: A | Discharge status: Home / Self Care | Payer: Medicare Other | Attending: Emergency Medicine | Admitting: Emergency Medicine

## 2022-08-16 ENCOUNTER — Other Ambulatory Visit: Payer: Self-pay

## 2022-08-16 DIAGNOSIS — R531 Weakness: Secondary | ICD-10-CM | POA: Insufficient documentation

## 2022-08-16 DIAGNOSIS — R197 Diarrhea, unspecified: Secondary | ICD-10-CM | POA: Diagnosis not present

## 2022-08-16 DIAGNOSIS — Z20822 Contact with and (suspected) exposure to covid-19: Secondary | ICD-10-CM | POA: Diagnosis not present

## 2022-08-16 DIAGNOSIS — R5383 Other fatigue: Secondary | ICD-10-CM | POA: Diagnosis not present

## 2022-08-16 DIAGNOSIS — R051 Acute cough: Secondary | ICD-10-CM | POA: Insufficient documentation

## 2022-08-16 DIAGNOSIS — Z7982 Long term (current) use of aspirin: Secondary | ICD-10-CM | POA: Diagnosis not present

## 2022-08-16 LAB — RESP PANEL BY RT-PCR (RSV, FLU A&B, COVID)  RVPGX2
Influenza A by PCR: NEGATIVE
Influenza B by PCR: NEGATIVE
Resp Syncytial Virus by PCR: NEGATIVE
SARS Coronavirus 2 by RT PCR: NEGATIVE

## 2022-08-16 MED ORDER — BENZONATATE 100 MG PO CAPS
200.0000 mg | ORAL_CAPSULE | Freq: Once | ORAL | Status: AC
  Administered 2022-08-16: 200 mg via ORAL
  Filled 2022-08-16: qty 2

## 2022-08-16 MED ORDER — SODIUM CHLORIDE 0.9 % IV BOLUS
500.0000 mL | Freq: Once | INTRAVENOUS | Status: AC
  Administered 2022-08-16: 500 mL via INTRAVENOUS

## 2022-08-16 MED ORDER — BENZONATATE 200 MG PO CAPS: 200.0000 mg | ORAL_CAPSULE | Freq: Three times a day (TID) | ORAL | 0 refills | Status: DC | PRN

## 2022-08-16 NOTE — Discharge Instructions (Signed)
Try to drink plenty of fluids.  You may take Tylenol every 4 hours if needed for body aches or fever.  Try drinking Boost to help provide nutrition, until your appetite improves.  You have been prescribed medication to help with your cough.  Follow-up with your primary care provider for recheck or return to the emergency department for any new or worsening symptoms.

## 2022-08-16 NOTE — ED Provider Notes (Signed)
[facility] Provider Note   CSN: [id number] Arrival date & time: 08/16/22  1601     History  No chief complaint on file.   Dana [name] is a 82 y.o. female.  The history is provided by the patient and a relative. The history is limited by a language barrier. A language interpreter was used (Patient's son-in-law who is at bedside provides language interpretation.  Attempted to use interpreter tablet, but no Samoan interpreter available.).       Dana [name] is a 82 y.o. female who presents to the Emergency Department, her son-in-law who provides language interpretation.  Patient reports having cough, symptoms lasting greater than one week with intermittent diarrhea.  Symptoms associated with generalized weakness and fatigue.  Cough mostly nonproductive.  She was visiting relatives in [place] and developed symptoms while there.  States her symptoms have been gradually improving but she still has decreased appetite and cough.  She denies vomiting, abdominal pain, chest pain, shortness of breath, and fever  Home Medications Prior to Admission medications   Medication Sig Start Date End Date Taking? Authorizing Provider  acetaminophen (TYLENOL) 325 MG tablet Take 2 tablets (650 mg total) by mouth every 6 (six) hours as needed for mild pain, fever or headache (or Fever >/= 101). 08/07/19   Roxan Hockey, MD  alendronate (FOSAMAX) 70 MG tablet Take 70 mg by mouth every Wednesday. 03/15/19   [provider]  ALLERGY RELIEF 10 MG tablet Take 10 mg by mouth daily. 07/26/19   [provider]  allopurinol (ZYLOPRIM) 300 MG tablet Take 300 mg by mouth daily.    [provider]  aspirin EC 81 MG tablet Take 1 tablet (81 mg total) by mouth daily with breakfast. 08/07/19   Denton Brick, Courage, MD  atorvastatin (LIPITOR) 10 MG tablet Take 10 mg by mouth daily.    [provider]  doxycycline (VIBRAMYCIN) 100 MG capsule Take  100 mg by mouth 2 (two) times daily. 01/24/22   [provider]  ezetimibe (ZETIA) 10 MG tablet Take 10 mg by mouth daily.    [provider]  famotidine (PEPCID) 20 MG tablet Take 1 tablet (20 mg total) by mouth 2 (two) times daily. 01/24/18   Carmin Muskrat, MD  ferrous sulfate 325 (65 FE) MG tablet Take 325 mg by mouth daily with breakfast.    [provider]  folic acid (FOLVITE) 1 MG tablet Take 1 mg by mouth daily. 07/26/19   [provider]  furosemide (LASIX) 20 MG tablet Take 20 mg by mouth daily as needed for fluid or edema.    [provider]  methotrexate 2.5 MG tablet Take 20 mg by mouth every Wednesday.     [provider]  mupirocin ointment (BACTROBAN) 2 % as needed for rash. 01/24/22   [provider]  nitroGLYCERIN (NITROSTAT) 0.4 MG SL tablet PLACE 1 TABLET UNDER THE TONGUE EVERY 5 MINUTES AS NEEDED FOR CHEST PAIN 03/05/21   Satira Sark, MD  pantoprazole (PROTONIX) 40 MG tablet Take 1 tablet (40 mg total) by mouth daily. 08/07/19   Roxan Hockey, MD  sucralfate (CARAFATE) 1 g tablet Take 1 g by mouth 2 (two) times daily. 08/01/19   [provider]  verapamil (VERELAN PM) 360 MG 24 hr capsule TAKE (1) CAPSULE BY MOUTH AT BEDTIME. 08/02/22   Chandrasekhar, Mahesh A, MD  Vitamin D, Cholecalciferol, 25 MCG (1000 UT) TABS Take 1 tablet by mouth daily. 12/31/20  [provider]      Allergies    Patient has no known allergies.    Review of Systems   Review of Systems  Constitutional:  Positive for appetite change, chills and fatigue. Negative for fever.  HENT:  Positive for congestion. Negative for sore throat.   Respiratory:  Positive for cough. Negative for shortness of breath.   Cardiovascular:  Negative for chest pain.  Gastrointestinal:  Positive for diarrhea. Negative for abdominal pain, nausea and vomiting.  Genitourinary:  Negative for difficulty urinating, dysuria and frequency.   Musculoskeletal:  Negative for arthralgias, back pain, neck pain and neck stiffness.  Skin:  Negative for rash.  Neurological:  Negative for dizziness, syncope, weakness, numbness and headaches.    Physical Exam Updated Vital Signs BP (!) 101/59   Pulse 73   Temp 98.4 F (36.9 C) (Oral)   Resp 16   Ht '5\' 2"'$  (1.575 m)   Wt 81.6 kg   SpO2 98%   BMI 32.92 kg/m  Physical Exam Vitals and nursing note reviewed.  Constitutional:      General: She is not in acute distress.    Appearance: Normal appearance. She is not ill-appearing or toxic-appearing.  HENT:     Right Ear: Tympanic membrane and ear canal normal.     Left Ear: Tympanic membrane and ear canal normal.     Nose: No rhinorrhea.     Mouth/Throat:     Mouth: Mucous membranes are moist.     Pharynx: No oropharyngeal exudate or posterior oropharyngeal erythema.  Eyes:     Conjunctiva/sclera: Conjunctivae normal.  Cardiovascular:     Rate and Rhythm: Normal rate and regular rhythm.     Pulses: Normal pulses.  Pulmonary:     Effort: Pulmonary effort is normal. No respiratory distress.     Breath sounds: No stridor. No wheezing.  Abdominal:     Palpations: Abdomen is soft.     Tenderness: There is no abdominal tenderness.  Musculoskeletal:     Cervical back: Normal range of motion. No rigidity.     Right lower leg: No edema.     Left lower leg: No edema.  Skin:    General: Skin is warm.     Capillary Refill: Capillary refill takes less than 2 seconds.     Findings: No rash.  Neurological:     General: No focal deficit present.     Mental Status: She is alert.     Sensory: No sensory deficit.     Motor: No weakness.     ED Results / Procedures / Treatments   Labs (all labs ordered are listed, but only abnormal results are displayed) Labs Reviewed  RESP PANEL BY RT-PCR (RSV, FLU A&B, COVID)  [id number]    EKG None  Radiology DG Chest Portable 1 View  Result Date: 08/16/2022 CLINICAL DATA:  Cough. EXAM:  PORTABLE CHEST 1 VIEW COMPARISON:  July 31, 2021 FINDINGS: The heart size and mediastinal contours are within normal limits. There is mild to moderate severity calcification of the aortic arch and tortuosity of the descending thoracic aorta. Diffuse, mild to moderate severity chronic appearing increased interstitial lung markings are seen. Mild linear atelectasis is seen within the mid right lung with additional atelectatic changes noted within the left lung base. There is no evidence of a pleural effusion or pneumothorax. Multilevel degenerative changes are seen throughout the thoracic spine. IMPRESSION: Chronic appearing increased interstitial lung markings with mild left basilar atelectasis. Electronically Signed  By: Virgina Norfolk M.D.   On: 08/16/2022 17:25    Procedures Procedures    Medications Ordered in ED Medications  sodium chloride 0.9 % bolus 500 mL (has no administration in time range)  benzonatate (TESSALON) capsule 200 mg (has no administration in time range)    ED Course/ Medical Decision Making/ A&P                             Medical Decision Making Patient here with complaints suggestive of viral illness.  Was visiting family in [place] when she developed illness.  She believes it was secondary to leaving the cold climate here and being exposed to the heat while there.  She has been having nonproductive cough, intermittent diarrhea and fatigue.  No known fever.  Reports feeling better but still not completely well.  Believes that she is dehydrated.  Clinically, she is well-appearing nontoxic.  Her vital signs are reassuring.  Lung sounds are clear to auscultation and she has a benign abdominal exam.  Clinically, I suspect this is viral illness, pneumonia, ACS, PE, sepsis, acute abdominal process, also considered but felt less likely.  Amount and/or Complexity of Data Reviewed Labs: ordered.    Details: Respiratory panel negative Radiology: ordered.    Details:  Chest x-ray without acute cardiopulmonary process Discussion of management or test interpretation with external provider(s): Well-appearing female here with symptoms suggesting viral process.  Her workup overall reassuring.  Concerned that she is dehydrated.  Given IV fluids and will reassess.  On recheck, patient has received IV fluids and Tessalon for her cough.  Her chest x-ray is reassuring.  She denies having any shortness of breath or chest pain.  She has ambulated in the department without difficulty or hypoxia.  She states that she is feeling better and requesting discharge home.  Will treat her symptoms, I suspect this may have been influenza or COVID and her symptoms have been present long enough that she now has a negative result.  Discussed importance of close outpatient follow-up will prompt ER return if her symptoms are worsening.  She is agreeable to plan.  Risk Prescription drug management.           Final Clinical Impression(s) / ED Diagnoses Final diagnoses:  Acute cough  Diarrhea, unspecified type    Rx / DC Orders ED Discharge Orders     None         Kem Parkinson, PA-C 08/19/22 1303    Godfrey Pick, MD 08/22/22 [phone]

## 2022-08-16 NOTE — ED Triage Notes (Signed)
Pt bib son in law, c/o cough x 1 week with diarrhea. Pt came back from [place] a week ago. Denies fever, no SOB, no chest pain.   Attempted to use Samoan interpreter via [employer] tablet, no available interpreter. Son in law answered for pt.

## 2022-08-26 NOTE — Progress Notes (Signed)
Cardiology Office Note:    Date:  09/06/2022   ID:  Dana [name], DOB 26-Feb-1941, MRN [id number]  PCP:  [name], MD  [facility] Cardiologist:  Rozann Lesches, MD     Referring MD: [name], MD   Chief Complaint:  Follow-up     History of Present Illness:   Dana [name] is a 82 y.o. female with a hx of CAD s/p PCI OM1 and HOCM with preserved LVEF and LVOT gradient of 100 mmHg.  She was unable to do stress test due to severe gradient.  2023: started AV nodal therapy with improvement-> now no symptoms, pending genetics follow up.  Was seen in Twice in [place]: once for chest pain and once for shortness of breath requiring increased diuretic.  NM PET CT 04/2022 with evidence of microvascular flow abn and severe CAC. If chest pain consider further testing. Overread mediastinal node little change from 2018 and 07/2021. Generalized increase in number of lymph nodes in chest can be seen in setting of CHF but consider f/u 306 months.  In ED 08/16/22 with cough after traveling to [place] to bury her oldest son,  treated with IV fluids. Felt likely to have been flu or covid but symptoms present long enough that she tested negative. She is here with her son in law who interprets for her. She's still fatigued. Hasn't had any further chest pain since in [place]. Does all her housework without a problem. F/u echo 08/30/22 shows HCM but LVOT gradients have improved. Dr. Gasper Sells recommended  metorpolol or mavacamten if still having chest pain.     Past Medical History:  Diagnosis Date   CAD (coronary artery disease)    a. s/p DES to OM1 in 02/2017   Chest pain    Essential hypertension    Gout    HOCM (hypertrophic obstructive cardiomyopathy) (HCC)    Hyperlipidemia    Rheumatoid arthritis (HCC)    Current Medications: Current Meds  Medication Sig   acetaminophen (TYLENOL) 325 MG tablet Take 2 tablets (650 mg total) by mouth every 6 (six) hours as needed for mild pain,  fever or headache (or Fever >/= 101).   alendronate (FOSAMAX) 70 MG tablet Take 70 mg by mouth every Wednesday.   ALLERGY RELIEF 10 MG tablet Take 10 mg by mouth daily.   allopurinol (ZYLOPRIM) 300 MG tablet Take 300 mg by mouth daily.   aspirin EC 81 MG tablet Take 1 tablet (81 mg total) by mouth daily with breakfast.   atorvastatin (LIPITOR) 10 MG tablet Take 10 mg by mouth daily.   benzonatate (TESSALON) 200 MG capsule Take 1 capsule (200 mg total) by mouth 3 (three) times daily as needed for cough.   doxycycline (VIBRAMYCIN) 100 MG capsule Take 100 mg by mouth 2 (two) times daily.   ezetimibe (ZETIA) 10 MG tablet Take 10 mg by mouth daily.   famotidine (PEPCID) 20 MG tablet Take 1 tablet (20 mg total) by mouth 2 (two) times daily.   ferrous sulfate 325 (65 FE) MG tablet Take 325 mg by mouth daily with breakfast.   folic acid (FOLVITE) 1 MG tablet Take 1 mg by mouth daily.   furosemide (LASIX) 20 MG tablet Take 20 mg by mouth daily as needed for fluid or edema.   methotrexate 2.5 MG tablet Take 20 mg by mouth every Wednesday.    mupirocin ointment (BACTROBAN) 2 % as needed for rash.   nitroGLYCERIN (NITROSTAT) 0.4 MG SL tablet PLACE 1 TABLET  UNDER THE TONGUE EVERY 5 MINUTES AS NEEDED FOR CHEST PAIN   pantoprazole (PROTONIX) 40 MG tablet Take 1 tablet (40 mg total) by mouth daily.   sucralfate (CARAFATE) 1 g tablet Take 1 g by mouth 2 (two) times daily.   verapamil (VERELAN PM) 360 MG 24 hr capsule TAKE (1) CAPSULE BY MOUTH AT BEDTIME.   Vitamin D, Cholecalciferol, 25 MCG (1000 UT) TABS Take 1 tablet by mouth daily.    Allergies:   Patient has no known allergies.   Social History   Tobacco Use   Smoking status: Never   Smokeless tobacco: Never  Vaping Use   Vaping Use: Never used  Substance Use Topics   Alcohol use: No   Drug use: No    Family Hx: The patient's family history includes Alcohol abuse in her father; Hypertension in her mother; Liver disease in her mother.  ROS      Physical Exam:    VS:  BP 112/70   Pulse 74   Ht 5\' 2"  (1.575 m)   Wt 158 lb 12.8 oz (72 kg)   SpO2 97%   BMI 29.04 kg/m     Wt Readings from Last 3 Encounters:  09/06/22 158 lb 12.8 oz (72 kg)  08/16/22 180 lb (81.6 kg)  06/30/22 175 lb 0.7 oz (79.4 kg)    Physical Exam  GEN: Well nourished, well developed, in no acute distress  Neck: no JVD, carotid bruits, or masses Cardiac:RRR; 2/6 systolic murmur LSB Respiratory:  clear to auscultation bilaterally, normal work of breathing GI: soft, nontender, nondistended, + BS Ext: without cyanosis, clubbing, or edema, Good distal pulses bilaterally Neuro:  Alert and Oriented x 3,  Psych: euthymic mood, full affect        EKGs/Labs/Other Test Reviewed:    EKG:  EKG is  not ordered today.     Recent Labs: 06/23/2022: ALT 14; BUN 12; Creatinine, Ser 0.75; Hemoglobin 11.4; Platelets 199; Potassium 3.2; Sodium 141   Recent Lipid Panel No results for input(s): "CHOL", "TRIG", "HDL", "VLDL", "LDLCALC", "LDLDIRECT" in the last 8760 hours.   Prior CV Studies:   Echo 08/30/22 IMPRESSIONS     1. LVOT is narrow (14 mm) Turbulent flow through the LVOT With valsalva  gradient was 49 mm Hg (3.5 m/sec) Compared to echo from April 2023  gradient is improved (80 to 49 mm Hg) . Left ventricular ejection  fraction, by estimation, is 70 to 75%. The left  ventricle has hyperdynamic function. The left ventricle has no regional  wall motion abnormalities. There is severe asymmetric left ventricular  hypertrophy.   2. Right ventricular systolic function is normal. The right ventricular  size is normal. There is normal pulmonary artery systolic pressure.   3. Left atrial size was mildly dilated.   4. Systolic anterior motion of the anterior and posterior mitral  leaflets.. Mild mitral valve regurgitation.   5. Poor acoustic windows limiit study AV is thickened, calcified with  mildly restricted motion. Difficult to assess for valve  gradients with  concomitant LVOT obstruction. The aortic valve is abnormal. Aortic valve  regurgitation is not visualized.   6. The inferior vena cava is normal in size with greater than 50%  respiratory variability, suggesting right atrial pressure of 3 mmHg.   NM PET CT 04/2022   Findings are consistent with no prior ischemia and no prior myocardial infarction. The study is intermediate risk due to the presence of decreased global myocardial blood flow reserve which may  indicate the presence of microvascular disease, however, cannot rule out underlying obstructive CAD given the presence of extensive coronary calcification. Could consider cath for further evaluation if clinically indicated.   LV perfusion is normal.   Rest left ventricular function is normal. Rest EF: 55 %. Stress left ventricular function is normal. Stress EF: 60 %. End diastolic cavity size is normal. End systolic cavity size is normal.   Myocardial blood flow was computed to be 0.70ml/g/min at rest and 1.71ml/g/min at stress. Global myocardial blood flow reserve was 1.29 and was abnormal.   Coronary calcium was present on the attenuation correction CT images. Severe coronary calcifications were present. Coronary calcifications were present in the left anterior descending artery, left circumflex artery and right coronary artery distribution(s).   CLINICAL DATA:  This over-read does not include interpretation of cardiac or coronary anatomy or pathology. The cardiac PET CT evaluation interpretation by the cardiologist is attached.   COMPARISON:  August 01, 2021   FINDINGS: Vascular: Please see dedicated report for cardiovascular details. There is aortic atherosclerosis and coronary artery calcification.   Mediastinum/Nodes:   Patulous esophagus. Mediastinal lymphadenopathy.   (Image 13/2) 17 mm RIGHT paratracheal lymph node may measured as much as 15-16 mm in 2018. Top-normal lymph node in the AP window (image 10/2) 14  mm. 10 mm in 2023 and new since 2018.   Increase in number of smaller lymph nodes in the pre-vascular space and upper AP window. No gross hilar adenopathy.   Lungs/Pleura: Patchy basilar atelectasis, no signs of effusion or consolidation.   Upper Abdomen: No acute upper abdominal process to the extent evaluated.   Musculoskeletal: No acute or destructive bone findings.   IMPRESSION: 1. Mediastinal nodal enlargement along the RIGHT paratracheal chain similar to February of 2023 and little changed compared to the study of August of 2018. New top-normal AP window lymph node size in generalized increase in number of lymph nodes in the chest. This finding can be seen in the setting of heart failure and should be correlated clinically. Given size of lymph nodes could consider 3 to six-month follow-up to ensure stability. 2. Aortic atherosclerosis and coronary artery calcification.   Aortic Atherosclerosis (ICD10-I70.0).     Electronically Signed   By: Zetta Bills M.D.   On: 05/10/2022 11:17    CORONARY STENT INTERVENTION, CORONARY STENT INTERVENTION 03/03/2017   Narrative Images from the original result were not included.    Ost LM lesion, 40 %stenosed.  1st Mrg lesion, 90 %stenosed.  A STENT SIERRA 3.00 X 12 MM drug eluting stent was successfully placed.  Post intervention, there is a 0% residual stenosis.  Lat 1st Mrg lesion, 60 %stenosed. Bifurcation lesion jailed by the initial stent  Post intervention - rescue PTCA only, there is a 20% residual stenosis.   Successful bifurcation PCI of large OM1 with major branch of OM1 PTCA. A Xience Sheldon DES stent was used for the main branch and then PTCA was performed through the stent into the side branch for ostial stenosis.     NM Myocar Multi W/Spect W/Wall Motion / EF 01/30/2017   Narrative  This is a low risk study.  The left ventricular ejection fraction is mildly decreased (45-54%).  There was no ST segment  deviation noted during stress.   Significant bowel artifact obscures some of the inferior and lateral wall No ischemia EF 54% low risk study   No results found for this or any previous visit from the past 3650 days.  ECHO COMPLETE WITH IMAGING ENHANCING AGENT 08/01/2021   Narrative ECHOCARDIOGRAM REPORT       Patient Name:   DARIN REDMANN Date of Exam: 08/01/2021 Medical Rec #:  [id number]    Height:       61.0 in Accession #:    [id number]   Weight:       185.0 lb Date of Birth:  15-Nov-1940    BSA:          1.827 m Patient Age:    61 years     BP:           128/66 mmHg Patient Gender: F            HR:           65 bpm. Exam Location:  Inpatient   Procedure: 2D Echo, Cardiac Doppler, Color Doppler and Intracardiac Opacification Agent   Indications:    Hypertrophic obstructive cardiomyopathy I42.1 Chest Pain R07.9   History:        Patient has prior history of Echocardiogram examinations, most recent 08/07/2019. CHF, CAD; Risk Factors:Hypertension, Dyslipidemia and Non-Smoker.   Sonographer:    Merrie Roof RDCS Referring Phys: [id number] [place]     1. Dynamic LVOT gradient ~ 100 mmhg. Left ventricular ejection fraction, by estimation, is 65 to 70%. The left ventricle has normal function. The left ventricle has no regional wall motion abnormalities. There is severe asymmetric left ventricular hypertrophy of the basal segment. Left ventricular diastolic parameters are consistent with Grade I diastolic dysfunction (impaired relaxation). 2. Right ventricular systolic function is normal. The right ventricular size is normal. There is normal pulmonary artery systolic pressure. 3. Mild mitral valve regurgitation. 4. Aortic valve regurgitation is not visualized. Aortic valve sclerosis/calcification is present, without any evidence of aortic stenosis. 5. The inferior vena cava is normal in size with greater than 50% respiratory variability, suggesting right atrial  pressure of 3 mmHg.   Comparison(s): No significant change from prior study.   Conclusion(s)/Recommendation(s): Findings consistent with hypertrophic obstructive cardiomyopathy.    ECHO LIMITED WO IMAGE ENHANCING AGENT 10/06/2021 1. 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 The left ventricle has hyperdynamic function. There is severe asymmetric left ventricular hypertrophy. Left ventricular diastolic parameters are consistent with Grade II diastolic dysfunction (pseudonormalization). Elevated left atrial pressure. The average left ventricular global longitudinal strain is -18.3 %. The global longitudinal strain is normal. 2. Right ventricular systolic function is normal. The right ventricular size is normal. 3. Left atrial size was severely dilated. 4. Severe systolic anterior motion of the mitral valve with severe LV outflow obstruction and mitral insufficiency.. Moderate to severe mitral valve regurgitation. 5. The peak gradient due to dynamic outflow tract obstruction is approximately 80 mm hg at rest (Valsalva maneuver was not performed).. The aortic valve is tricuspid. There is mild calcification of the aortic valve. There is mild thickening of the aortic valve. Aortic valve regurgitation is trivial. Aortic valve sclerosis/calcification is present, without any evidence of aortic stenosis.     Cardiac event monitor    LONG TERM MONITOR (8-14 DAYS) HOOK UP AND INTERPRETATION 11/17/2021   Narrative  Patient had a minimum heart rate of 37 bpm, maximum heart rate of 160 bpm, and average heart rate of 78 bpm.  Predominant underlying rhythm was sinus rhythm.  P-SVT 10 episodes lasting 10 beats at longest with a max rate of 160 bpm  at fastest.  Isolated PACs were rare (<1.0%).  Isolated PVCs were rare (<1.0%).  Two nocturnal Wenckebach episodes noted.  No triggered and diary  events.     Risk Assessment/Calculations/Metrics:              ASSESSMENT & PLAN:   No problem-specific Assessment & Plan notes found for this encounter.   Hypertrophic Cardiomyopathy  - Septal Variant; obstructive, limited evidence of small apical anerysm, preserved EF - with associated MR but no primary MR - NYHA I on 20 mg Lasix daily and verapamil 360 mg; would not tolerate dual AV nodale therapy    - She has 08/30/22 Echo that showed decrease in  LVOT gradient. No further chest pain and held off on starting mavacamten. F/u with Dr. Gasper Sells in June.   Given age likely no ICD   -Has genetic testing 11/2022 for evals of sons and daugther   CAD DES OM1 2018, PET MPI 04/2022; evidence of  microvascular disease and severe CAC.  unelss she has evidence of obstructive CAD we will not pursue further ischemic testing - she does not want SRT; when we discussed medical therapy with CMI's she is amenable if her CP or dizziness get worsen-per Dr. Gasper Sells -no chest pains since she traveled to [place] and forgot her meds.    HTN-BP well controlled and on low side.            Dispo:  No follow-ups on file.   Medication Adjustments/Labs and Tests Ordered: Current medicines are reviewed at length with the patient today.  Concerns regarding medicines are outlined above.  Tests Ordered: No orders of the defined types were placed in this encounter.  Medication Changes: No orders of the defined types were placed in this encounter.  Sumner Boast, PA-C  09/06/2022 11:53 AM    [address], [place], [place]  [address] Phone: [phone]; Fax: [phone]

## 2022-08-30 ENCOUNTER — Ambulatory Visit (HOSPITAL_COMMUNITY)
Admission: RE | Admit: 2022-08-30 | Discharge: 2022-08-30 | Disposition: A | Discharge status: Home / Self Care | Payer: Medicare Other | Source: Ambulatory Visit | Attending: Internal Medicine | Admitting: Internal Medicine

## 2022-08-30 DIAGNOSIS — I421 Obstructive hypertrophic cardiomyopathy: Secondary | ICD-10-CM

## 2022-08-30 LAB — ECHOCARDIOGRAM COMPLETE
AR max vel: 1.6 cm2
AV Mean grad: 25 mmHg
AV Peak grad: 42 mmHg
Ao pk vel: 3.24 m/s
Area-P 1/2: 2.73 cm2
MV M vel: 2.87 m/s
MV Peak grad: 32.9 mmHg
S' Lateral: 2 cm

## 2022-08-30 NOTE — Progress Notes (Signed)
*  PRELIMINARY RESULTS* Echocardiogram 2D Echocardiogram has been performed.  Dana [name] 08/30/2022, 11:22 AM

## 2022-09-06 ENCOUNTER — Ambulatory Visit: Payer: Medicare Other | Attending: Internal Medicine | Admitting: Physician Assistant

## 2022-09-06 ENCOUNTER — Encounter: Payer: Self-pay | Admitting: Physician Assistant

## 2022-09-06 VITALS — BP 112/70 | HR 74 | Ht 62.0 in | Wt 158.8 lb

## 2022-09-06 DIAGNOSIS — I10 Essential (primary) hypertension: Secondary | ICD-10-CM

## 2022-09-06 DIAGNOSIS — I25118 Atherosclerotic heart disease of native coronary artery with other forms of angina pectoris: Secondary | ICD-10-CM | POA: Diagnosis present

## 2022-09-06 DIAGNOSIS — I421 Obstructive hypertrophic cardiomyopathy: Secondary | ICD-10-CM | POA: Diagnosis present

## 2022-09-06 NOTE — Patient Instructions (Addendum)
Medication Instructions:  Your physician recommends that you continue on your current medications as directed. Please refer to the Current Medication list given to you today.   Labwork: None today  Testing/Procedures: None today  Follow-Up: 12/06/22 at 9:40 am with Dr.Chandrasekhar  at the [facility] office- arrive at 20 minutes early for appointment  Any Other Special Instructions Will Be Listed Below (If Applicable).  If you need a refill on your cardiac medications before your next appointment, please call your pharmacy. [name]

## 2022-10-15 ENCOUNTER — Emergency Department (HOSPITAL_COMMUNITY): Payer: Medicare Other

## 2022-10-15 ENCOUNTER — Other Ambulatory Visit: Payer: Self-pay

## 2022-10-15 ENCOUNTER — Encounter (HOSPITAL_COMMUNITY): Payer: Self-pay

## 2022-10-15 ENCOUNTER — Inpatient Hospital Stay (HOSPITAL_COMMUNITY)
Admission: EM | Admit: 2022-10-15 | Discharge: 2022-10-21 | DRG: 377 | Disposition: A | Discharge status: Home / Self Care | Payer: Medicare Other | Attending: Family Medicine | Admitting: Family Medicine

## 2022-10-15 DIAGNOSIS — K5731 Diverticulosis of large intestine without perforation or abscess with bleeding: Secondary | ICD-10-CM | POA: Diagnosis not present

## 2022-10-15 DIAGNOSIS — I21A1 Myocardial infarction type 2: Secondary | ICD-10-CM | POA: Diagnosis present

## 2022-10-15 DIAGNOSIS — I10 Essential (primary) hypertension: Secondary | ICD-10-CM | POA: Diagnosis present

## 2022-10-15 DIAGNOSIS — I2510 Atherosclerotic heart disease of native coronary artery without angina pectoris: Secondary | ICD-10-CM | POA: Diagnosis present

## 2022-10-15 DIAGNOSIS — M109 Gout, unspecified: Secondary | ICD-10-CM | POA: Diagnosis present

## 2022-10-15 DIAGNOSIS — Z79899 Other long term (current) drug therapy: Secondary | ICD-10-CM

## 2022-10-15 DIAGNOSIS — Z7982 Long term (current) use of aspirin: Secondary | ICD-10-CM

## 2022-10-15 DIAGNOSIS — I340 Nonrheumatic mitral (valve) insufficiency: Secondary | ICD-10-CM | POA: Diagnosis present

## 2022-10-15 DIAGNOSIS — K2971 Gastritis, unspecified, with bleeding: Secondary | ICD-10-CM | POA: Diagnosis not present

## 2022-10-15 DIAGNOSIS — I421 Obstructive hypertrophic cardiomyopathy: Secondary | ICD-10-CM | POA: Diagnosis present

## 2022-10-15 DIAGNOSIS — K639 Disease of intestine, unspecified: Secondary | ICD-10-CM

## 2022-10-15 DIAGNOSIS — Z811 Family history of alcohol abuse and dependence: Secondary | ICD-10-CM

## 2022-10-15 DIAGNOSIS — Q394 Esophageal web: Secondary | ICD-10-CM

## 2022-10-15 DIAGNOSIS — R7989 Other specified abnormal findings of blood chemistry: Secondary | ICD-10-CM | POA: Diagnosis present

## 2022-10-15 DIAGNOSIS — Z8249 Family history of ischemic heart disease and other diseases of the circulatory system: Secondary | ICD-10-CM

## 2022-10-15 DIAGNOSIS — D125 Benign neoplasm of sigmoid colon: Secondary | ICD-10-CM

## 2022-10-15 DIAGNOSIS — I959 Hypotension, unspecified: Secondary | ICD-10-CM | POA: Diagnosis present

## 2022-10-15 DIAGNOSIS — Z955 Presence of coronary angioplasty implant and graft: Secondary | ICD-10-CM

## 2022-10-15 DIAGNOSIS — Z5309 Procedure and treatment not carried out because of other contraindication: Secondary | ICD-10-CM | POA: Diagnosis present

## 2022-10-15 DIAGNOSIS — E782 Mixed hyperlipidemia: Secondary | ICD-10-CM | POA: Diagnosis present

## 2022-10-15 DIAGNOSIS — K921 Melena: Secondary | ICD-10-CM

## 2022-10-15 DIAGNOSIS — Z79631 Long term (current) use of antimetabolite agent: Secondary | ICD-10-CM

## 2022-10-15 DIAGNOSIS — M069 Rheumatoid arthritis, unspecified: Secondary | ICD-10-CM | POA: Diagnosis present

## 2022-10-15 DIAGNOSIS — K922 Gastrointestinal hemorrhage, unspecified: Principal | ICD-10-CM | POA: Diagnosis present

## 2022-10-15 DIAGNOSIS — K648 Other hemorrhoids: Secondary | ICD-10-CM | POA: Diagnosis present

## 2022-10-15 DIAGNOSIS — K219 Gastro-esophageal reflux disease without esophagitis: Secondary | ICD-10-CM | POA: Diagnosis present

## 2022-10-15 DIAGNOSIS — K3189 Other diseases of stomach and duodenum: Secondary | ICD-10-CM | POA: Diagnosis present

## 2022-10-15 DIAGNOSIS — D649 Anemia, unspecified: Secondary | ICD-10-CM | POA: Diagnosis present

## 2022-10-15 DIAGNOSIS — K2970 Gastritis, unspecified, without bleeding: Secondary | ICD-10-CM

## 2022-10-15 DIAGNOSIS — D127 Benign neoplasm of rectosigmoid junction: Secondary | ICD-10-CM | POA: Diagnosis present

## 2022-10-15 DIAGNOSIS — D62 Acute posthemorrhagic anemia: Secondary | ICD-10-CM | POA: Diagnosis present

## 2022-10-15 DIAGNOSIS — Z9049 Acquired absence of other specified parts of digestive tract: Secondary | ICD-10-CM

## 2022-10-15 DIAGNOSIS — Z7983 Long term (current) use of bisphosphonates: Secondary | ICD-10-CM

## 2022-10-15 DIAGNOSIS — E860 Dehydration: Secondary | ICD-10-CM | POA: Diagnosis present

## 2022-10-15 DIAGNOSIS — E876 Hypokalemia: Secondary | ICD-10-CM | POA: Insufficient documentation

## 2022-10-15 LAB — BASIC METABOLIC PANEL
Anion gap: 7 (ref 5–15)
BUN: 13 mg/dL (ref 8–23)
CO2: 26 mmol/L (ref 22–32)
Calcium: 8.1 mg/dL — ABNORMAL LOW (ref 8.9–10.3)
Chloride: 102 mmol/L (ref 98–111)
Creatinine, Ser: 0.71 mg/dL (ref 0.44–1.00)
GFR, Estimated: >60 mL/min (ref >60)
Glucose, Bld: 120 mg/dL — ABNORMAL HIGH (ref 70–99)
Potassium: 3.3 mmol/L — ABNORMAL LOW (ref 3.5–5.1)
Sodium: 135 mmol/L (ref 135–145)

## 2022-10-15 LAB — TROPONIN I (HIGH SENSITIVITY)
Troponin I (High Sensitivity): 318 ng/L (ref <18)
Troponin I (High Sensitivity): 315 ng/L (ref <18)

## 2022-10-15 LAB — TYPE AND SCREEN

## 2022-10-15 LAB — CBC
HCT: 25.6 % — ABNORMAL LOW (ref 36.0–46.0)
Hemoglobin: 8.3 g/dL — ABNORMAL LOW (ref 12.0–15.0)
MCH: 32 pg (ref 26.0–34.0)
MCHC: 32.4 g/dL (ref 30.0–36.0)
MCV: 98.8 fL (ref 80.0–100.0)
Platelets: 212 10*3/uL (ref 150–400)
RBC: 2.59 MIL/uL — ABNORMAL LOW (ref 3.87–5.11)
RDW: 15.3 % (ref 11.5–15.5)
WBC: 6.3 10*3/uL (ref 4.0–10.5)
nRBC: 0 % (ref 0.0–0.2)

## 2022-10-15 LAB — LACTIC ACID, PLASMA
Lactic Acid, Venous: 1.6 mmol/L (ref 0.5–1.9)
Lactic Acid, Venous: 1.5 mmol/L (ref 0.5–1.9)

## 2022-10-15 LAB — ABO/RH: ABO/RH(D): A POS

## 2022-10-15 LAB — BRAIN NATRIURETIC PEPTIDE: B Natriuretic Peptide: 474 pg/mL — ABNORMAL HIGH (ref 0.0–100.0)

## 2022-10-15 MED ORDER — SODIUM CHLORIDE 0.9 % IV BOLUS
500.0000 mL | Freq: Once | INTRAVENOUS | Status: AC
  Administered 2022-10-15: 500 mL via INTRAVENOUS

## 2022-10-15 MED ORDER — PANTOPRAZOLE SODIUM 40 MG IV SOLR
80.0000 mg | Freq: Once | INTRAVENOUS | Status: AC
  Administered 2022-10-15: 80 mg via INTRAVENOUS
  Filled 2022-10-15: qty 20

## 2022-10-15 NOTE — ED Notes (Signed)
Troponin 318 reported to Dr. Rhunette Croft.

## 2022-10-15 NOTE — ED Triage Notes (Signed)
Pt c/o intermittent chest pain x 2 days. Denies radiation and SOB

## 2022-10-16 ENCOUNTER — Encounter (HOSPITAL_COMMUNITY): Payer: Self-pay | Admitting: Internal Medicine

## 2022-10-16 DIAGNOSIS — M109 Gout, unspecified: Secondary | ICD-10-CM | POA: Diagnosis present

## 2022-10-16 DIAGNOSIS — K3189 Other diseases of stomach and duodenum: Secondary | ICD-10-CM | POA: Diagnosis not present

## 2022-10-16 DIAGNOSIS — K2990 Gastroduodenitis, unspecified, without bleeding: Secondary | ICD-10-CM | POA: Diagnosis not present

## 2022-10-16 DIAGNOSIS — R195 Other fecal abnormalities: Secondary | ICD-10-CM | POA: Diagnosis not present

## 2022-10-16 DIAGNOSIS — Q394 Esophageal web: Secondary | ICD-10-CM | POA: Diagnosis not present

## 2022-10-16 DIAGNOSIS — K922 Gastrointestinal hemorrhage, unspecified: Principal | ICD-10-CM

## 2022-10-16 DIAGNOSIS — Z955 Presence of coronary angioplasty implant and graft: Secondary | ICD-10-CM | POA: Diagnosis not present

## 2022-10-16 DIAGNOSIS — K2100 Gastro-esophageal reflux disease with esophagitis, without bleeding: Secondary | ICD-10-CM | POA: Diagnosis not present

## 2022-10-16 DIAGNOSIS — I10 Essential (primary) hypertension: Secondary | ICD-10-CM

## 2022-10-16 DIAGNOSIS — D649 Anemia, unspecified: Secondary | ICD-10-CM | POA: Diagnosis not present

## 2022-10-16 DIAGNOSIS — D500 Iron deficiency anemia secondary to blood loss (chronic): Secondary | ICD-10-CM | POA: Diagnosis not present

## 2022-10-16 DIAGNOSIS — K5730 Diverticulosis of large intestine without perforation or abscess without bleeding: Secondary | ICD-10-CM | POA: Diagnosis not present

## 2022-10-16 DIAGNOSIS — K5731 Diverticulosis of large intestine without perforation or abscess with bleeding: Secondary | ICD-10-CM | POA: Diagnosis present

## 2022-10-16 DIAGNOSIS — E876 Hypokalemia: Secondary | ICD-10-CM | POA: Diagnosis present

## 2022-10-16 DIAGNOSIS — M069 Rheumatoid arthritis, unspecified: Secondary | ICD-10-CM | POA: Diagnosis present

## 2022-10-16 DIAGNOSIS — Z8249 Family history of ischemic heart disease and other diseases of the circulatory system: Secondary | ICD-10-CM | POA: Diagnosis not present

## 2022-10-16 DIAGNOSIS — D127 Benign neoplasm of rectosigmoid junction: Secondary | ICD-10-CM | POA: Diagnosis present

## 2022-10-16 DIAGNOSIS — E782 Mixed hyperlipidemia: Secondary | ICD-10-CM | POA: Diagnosis present

## 2022-10-16 DIAGNOSIS — R079 Chest pain, unspecified: Secondary | ICD-10-CM | POA: Diagnosis not present

## 2022-10-16 DIAGNOSIS — Z538 Procedure and treatment not carried out for other reasons: Secondary | ICD-10-CM | POA: Diagnosis not present

## 2022-10-16 DIAGNOSIS — R7989 Other specified abnormal findings of blood chemistry: Secondary | ICD-10-CM | POA: Diagnosis not present

## 2022-10-16 DIAGNOSIS — I421 Obstructive hypertrophic cardiomyopathy: Secondary | ICD-10-CM | POA: Diagnosis present

## 2022-10-16 DIAGNOSIS — I21A1 Myocardial infarction type 2: Secondary | ICD-10-CM | POA: Diagnosis present

## 2022-10-16 DIAGNOSIS — R58 Hemorrhage, not elsewhere classified: Secondary | ICD-10-CM | POA: Diagnosis not present

## 2022-10-16 DIAGNOSIS — K648 Other hemorrhoids: Secondary | ICD-10-CM | POA: Diagnosis present

## 2022-10-16 DIAGNOSIS — I110 Hypertensive heart disease with heart failure: Secondary | ICD-10-CM | POA: Diagnosis not present

## 2022-10-16 DIAGNOSIS — D62 Acute posthemorrhagic anemia: Secondary | ICD-10-CM | POA: Diagnosis present

## 2022-10-16 DIAGNOSIS — I959 Hypotension, unspecified: Secondary | ICD-10-CM | POA: Diagnosis present

## 2022-10-16 DIAGNOSIS — I25119 Atherosclerotic heart disease of native coronary artery with unspecified angina pectoris: Secondary | ICD-10-CM | POA: Diagnosis not present

## 2022-10-16 DIAGNOSIS — Z79899 Other long term (current) drug therapy: Secondary | ICD-10-CM | POA: Diagnosis not present

## 2022-10-16 DIAGNOSIS — I2510 Atherosclerotic heart disease of native coronary artery without angina pectoris: Secondary | ICD-10-CM | POA: Diagnosis present

## 2022-10-16 DIAGNOSIS — T182XXA Foreign body in stomach, initial encounter: Secondary | ICD-10-CM | POA: Diagnosis not present

## 2022-10-16 DIAGNOSIS — D3A8 Other benign neuroendocrine tumors: Secondary | ICD-10-CM | POA: Diagnosis not present

## 2022-10-16 DIAGNOSIS — K2971 Gastritis, unspecified, with bleeding: Secondary | ICD-10-CM | POA: Diagnosis present

## 2022-10-16 DIAGNOSIS — I340 Nonrheumatic mitral (valve) insufficiency: Secondary | ICD-10-CM | POA: Diagnosis present

## 2022-10-16 DIAGNOSIS — D125 Benign neoplasm of sigmoid colon: Secondary | ICD-10-CM | POA: Diagnosis present

## 2022-10-16 DIAGNOSIS — Z811 Family history of alcohol abuse and dependence: Secondary | ICD-10-CM | POA: Diagnosis not present

## 2022-10-16 DIAGNOSIS — K635 Polyp of colon: Secondary | ICD-10-CM | POA: Diagnosis not present

## 2022-10-16 DIAGNOSIS — K219 Gastro-esophageal reflux disease without esophagitis: Secondary | ICD-10-CM | POA: Diagnosis present

## 2022-10-16 DIAGNOSIS — E860 Dehydration: Secondary | ICD-10-CM | POA: Diagnosis present

## 2022-10-16 DIAGNOSIS — Z9049 Acquired absence of other specified parts of digestive tract: Secondary | ICD-10-CM | POA: Diagnosis not present

## 2022-10-16 LAB — CBC
HCT: 28.4 % — ABNORMAL LOW (ref 36.0–46.0)
Hemoglobin: 9.3 g/dL — ABNORMAL LOW (ref 12.0–15.0)
MCH: 31.4 pg (ref 26.0–34.0)
MCHC: 32.7 g/dL (ref 30.0–36.0)
MCV: 95.9 fL (ref 80.0–100.0)
Platelets: 156 10*3/uL (ref 150–400)
RBC: 2.96 MIL/uL — ABNORMAL LOW (ref 3.87–5.11)
RDW: 17 % — ABNORMAL HIGH (ref 11.5–15.5)
WBC: 5 10*3/uL (ref 4.0–10.5)
nRBC: 0 % (ref 0.0–0.2)

## 2022-10-16 LAB — COMPREHENSIVE METABOLIC PANEL
ALT: 13 U/L (ref 0–44)
AST: 21 U/L (ref 15–41)
Albumin: 2.3 g/dL — ABNORMAL LOW (ref 3.5–5.0)
Alkaline Phosphatase: 83 U/L (ref 38–126)
Anion gap: 6 (ref 5–15)
BUN: 12 mg/dL (ref 8–23)
CO2: 26 mmol/L (ref 22–32)
Calcium: 7.8 mg/dL — ABNORMAL LOW (ref 8.9–10.3)
Chloride: 104 mmol/L (ref 98–111)
Creatinine, Ser: 0.68 mg/dL (ref 0.44–1.00)
GFR, Estimated: >60 mL/min (ref >60)
Glucose, Bld: 95 mg/dL (ref 70–99)
Potassium: 3.2 mmol/L — ABNORMAL LOW (ref 3.5–5.1)
Sodium: 136 mmol/L (ref 135–145)
Total Bilirubin: 0.8 mg/dL (ref 0.3–1.2)
Total Protein: 5.9 g/dL — ABNORMAL LOW (ref 6.5–8.1)

## 2022-10-16 LAB — TYPE AND SCREEN: ABO/RH(D): A POS

## 2022-10-16 LAB — MAGNESIUM: Magnesium: 1.8 mg/dL (ref 1.7–2.4)

## 2022-10-16 LAB — BPAM RBC: Unit Type and Rh: 6200

## 2022-10-16 LAB — TROPONIN I (HIGH SENSITIVITY)
Troponin I (High Sensitivity): 356 ng/L (ref <18)
Troponin I (High Sensitivity): 846 ng/L (ref <18)

## 2022-10-16 LAB — PREPARE RBC (CROSSMATCH)

## 2022-10-16 LAB — PHOSPHORUS: Phosphorus: 3.4 mg/dL (ref 2.5–4.6)

## 2022-10-16 LAB — HEMOGLOBIN AND HEMATOCRIT, BLOOD
HCT: 28.5 % — ABNORMAL LOW (ref 36.0–46.0)
Hemoglobin: 9.2 g/dL — ABNORMAL LOW (ref 12.0–15.0)

## 2022-10-16 MED ORDER — POTASSIUM CHLORIDE CRYS ER 20 MEQ PO TBCR
40.0000 meq | EXTENDED_RELEASE_TABLET | Freq: Once | ORAL | Status: AC
  Administered 2022-10-16: 40 meq via ORAL
  Filled 2022-10-16: qty 2

## 2022-10-16 MED ORDER — ONDANSETRON HCL 4 MG/2ML IJ SOLN: 4.0000 mg | Freq: Four times a day (QID) | INTRAMUSCULAR | Status: DC | PRN

## 2022-10-16 MED ORDER — HEPARIN SODIUM (PORCINE) 5000 UNIT/ML IJ SOLN: 5000.0000 [IU] | Freq: Three times a day (TID) | INTRAMUSCULAR | Status: DC

## 2022-10-16 MED ORDER — ACETAMINOPHEN 325 MG PO TABS: 650.0000 mg | ORAL_TABLET | Freq: Four times a day (QID) | ORAL | Status: DC | PRN

## 2022-10-16 MED ORDER — ONDANSETRON HCL 4 MG PO TABS: 4.0000 mg | ORAL_TABLET | Freq: Four times a day (QID) | ORAL | Status: DC | PRN

## 2022-10-16 MED ORDER — EZETIMIBE 10 MG PO TABS
10.0000 mg | ORAL_TABLET | Freq: Every day | ORAL | Status: DC
  Administered 2022-10-16 – 2022-10-21 (×6): 10 mg via ORAL
  Filled 2022-10-16 (×6): qty 1

## 2022-10-16 MED ORDER — ATORVASTATIN CALCIUM 10 MG PO TABS
10.0000 mg | ORAL_TABLET | Freq: Every day | ORAL | Status: DC
  Administered 2022-10-16 – 2022-10-21 (×6): 10 mg via ORAL
  Filled 2022-10-16 (×6): qty 1

## 2022-10-16 MED ORDER — ACETAMINOPHEN 650 MG RE SUPP: 650.0000 mg | Freq: Four times a day (QID) | RECTAL | Status: DC | PRN

## 2022-10-16 MED ORDER — PANTOPRAZOLE SODIUM 40 MG IV SOLR
40.0000 mg | Freq: Two times a day (BID) | INTRAVENOUS | Status: DC
  Administered 2022-10-16 – 2022-10-21 (×11): 40 mg via INTRAVENOUS
  Filled 2022-10-16 (×11): qty 10

## 2022-10-16 MED ORDER — SODIUM CHLORIDE 0.9% IV SOLUTION: Freq: Once | INTRAVENOUS | Status: AC

## 2022-10-16 MED ORDER — FERROUS SULFATE 325 (65 FE) MG PO TABS
325.0000 mg | ORAL_TABLET | Freq: Every day | ORAL | Status: DC
  Administered 2022-10-16 – 2022-10-21 (×6): 325 mg via ORAL
  Filled 2022-10-16 (×7): qty 1

## 2022-10-16 NOTE — ED Notes (Signed)
Request sent to provider for a repeat Troponin level. Patient has not had a repeat since previous shift. Provider advised to put an order in for redraw.

## 2022-10-16 NOTE — Progress Notes (Signed)
Subjective: Patient admitted this morning, see detailed H&P by Dr Thomes Dinning 82 y/o  female with medical history significant of hypertension, hyperlipidemia, CAD s/p stent placement, GERD who presents to the emergency department due to 2-day onset of intermittent left parasternal chest pain. Patient also complained of several months of black stool. Troponin was elevated 318 greater than 315, BNP 474, chest x-ray showed no acute cardiopulmonary process.  Vitals:   10/16/22 0530 10/16/22 0621  BP: 106/69 114/60  Pulse: 74 74  Resp: 19 (!) 21  Temp:  98 F (36.7 C)  SpO2: 94% 98%      A/P Chest pain -Resolved -Elevated troponin 318, 315 -Plan to transfer to [facility] for cardiology evaluation  GI bleed/symptomatic anemia -H&H is 8.3/25.6; previously 11.4/35.4 on 06/15/2022 -FOBT positive -Patient takes ferrous sulfate -Continue IV Protonix -Dr. Elnoria Howard was consulted by ED provider  Hypertension -Antihypertensive medication on hold due to soft BP  Hyperlipidemia -Continue Lipitor, Zetia    Meredeth Ide [facility]

## 2022-10-16 NOTE — ED Notes (Signed)
Report given to Sierra RN

## 2022-10-16 NOTE — ED Notes (Signed)
Provider notified on critical troponin of 846

## 2022-10-16 NOTE — H&P (Signed)
History and Physical    PatientDarci [name] ZOX:[id number] DOB: Nov 25, 1940 DOA: 10/15/2022 DOS: the patient was seen and examined on 10/16/2022 PCP: [name], MD  Patient coming from: Home  Chief Complaint:  Chief Complaint  Patient presents with   Chest Pain   HPI: Dana [name] is a 82 y.o. female with medical history significant of hypertension, hyperlipidemia, CAD s/p stent placement, GERD who presents to the emergency department due to 2-day onset of intermittent left parasternal chest pain.  Patient was unable to provide history, history was obtained from ED physician and son at bedside.  Per report, was unable to determine if pain was reproducible or not.  She also complained of some abdominal pain which resolved after having a large bowel movement which was noted to be black.  Apparently, patient has not had any bowel movement for 3 days prior to now and she endorsed several months of black stool which she thought may be due to any of the medications that she was taking at home.  There was no report of shortness of breath, fever, chills.  ED Course:  In the emergency department, BP was 99/72 and other vital signs were within normal range.  Workup in the ED showed WBC 6.3, H/H 8.3/25.6, this was 11.4/35.4 on 06/15/2022.  Platelets 212. BMP was normal except for potassium of 3.3 and blood glucose of 120.  Troponin 318 > 315, lactic acid was normal, BNP 474 (chronically elevated, this was 406 on 07/31/2021). Chest x-ray showed no acute cardiopulmonary disease IV NS 500 mL was given, Protonix was provided.  Cardiologist at [facility] was consulted due to elevated troponin and it was recommended for patient to be admitted to [facility] for cardiology to be consulted after patient arrives to [facility] per [facility] EDP Gastroenterologist was consulted and will follow-up with patient when she arrives to [facility] per [facility] EDP.   Review of Systems: Review of systems as noted in the HPI. All other systems reviewed and are  negative.   Past Medical History:  Diagnosis Date   CAD (coronary artery disease)    a. s/p DES to OM1 in 02/2017   Chest pain    Essential hypertension    Gout    HOCM (hypertrophic obstructive cardiomyopathy) (HCC)    Hyperlipidemia    Rheumatoid arthritis (HCC)    Past Surgical History:  Procedure Laterality Date   CHOLECYSTECTOMY N/A 08/17/2018   Procedure: LAPAROSCOPIC CHOLECYSTECTOMY;  Surgeon: [name], MD;  Location: [facility] ORS;  Service: General;  Laterality: N/A;   CORONARY BALLOON ANGIOPLASTY N/A 03/03/2017   Procedure: CORONARY BALLOON ANGIOPLASTY;  Surgeon: Marykay Lex, MD;  Location: [facility] INVASIVE CV LAB;  Service: Cardiovascular;  Laterality: N/A;   CORONARY STENT INTERVENTION N/A 03/03/2017   Procedure: CORONARY STENT INTERVENTION;  Surgeon: Marykay Lex, MD;  Location: [facility] INVASIVE CV LAB;  Service: Cardiovascular;  Laterality: N/A;   LEFT HEART CATH AND CORONARY ANGIOGRAPHY N/A 03/02/2017   Procedure: LEFT HEART CATH AND CORONARY ANGIOGRAPHY;  Surgeon: Tonny Bollman, MD;  Location: [facility] INVASIVE CV LAB;  Service: Cardiovascular;  Laterality: N/A;    Social History:  reports that she has never smoked. She has never used smokeless tobacco. She reports that she does not drink alcohol and does not use drugs.   No Known Allergies  Family History  Problem Relation Age of Onset   Hypertension Mother    Liver disease Mother    Alcohol abuse Father      Prior to Admission  medications   Medication Sig Start Date End Date Taking? Authorizing Provider  acetaminophen (TYLENOL) 325 MG tablet Take 2 tablets (650 mg total) by mouth every 6 (six) hours as needed for mild pain, fever or headache (or Fever >/= 101). 08/07/19   Shon Hale, MD  alendronate (FOSAMAX) 70 MG tablet Take 70 mg by mouth every Wednesday. 03/15/19   [provider]  ALLERGY RELIEF 10 MG tablet Take 10 mg by mouth daily. 07/26/19   [provider]  allopurinol (ZYLOPRIM) 300 MG  tablet Take 300 mg by mouth daily.    [provider]  aspirin EC 81 MG tablet Take 1 tablet (81 mg total) by mouth daily with breakfast. 08/07/19   Mariea Clonts, Courage, MD  atorvastatin (LIPITOR) 10 MG tablet Take 10 mg by mouth daily.    [provider]  benzonatate (TESSALON) 200 MG capsule Take 1 capsule (200 mg total) by mouth 3 (three) times daily as needed for cough. 08/16/22   Triplett, Tammy, PA-C  doxycycline (VIBRAMYCIN) 100 MG capsule Take 100 mg by mouth 2 (two) times daily. 01/24/22   [provider]  ezetimibe (ZETIA) 10 MG tablet Take 10 mg by mouth daily.    [provider]  famotidine (PEPCID) 20 MG tablet Take 1 tablet (20 mg total) by mouth 2 (two) times daily. 01/24/18   Gerhard Munch, MD  ferrous sulfate 325 (65 FE) MG tablet Take 325 mg by mouth daily with breakfast.    [provider]  folic acid (FOLVITE) 1 MG tablet Take 1 mg by mouth daily. 07/26/19   [provider]  furosemide (LASIX) 20 MG tablet Take 20 mg by mouth daily as needed for fluid or edema.    [provider]  methotrexate 2.5 MG tablet Take 20 mg by mouth every Wednesday.     [provider]  mupirocin ointment (BACTROBAN) 2 % as needed for rash. 01/24/22   [provider]  nitroGLYCERIN (NITROSTAT) 0.4 MG SL tablet PLACE 1 TABLET UNDER THE TONGUE EVERY 5 MINUTES AS NEEDED FOR CHEST PAIN 03/05/21   Jonelle Sidle, MD  pantoprazole (PROTONIX) 40 MG tablet Take 1 tablet (40 mg total) by mouth daily. 08/07/19   Shon Hale, MD  sucralfate (CARAFATE) 1 g tablet Take 1 g by mouth 2 (two) times daily. 08/01/19   [provider]  verapamil (VERELAN PM) 360 MG 24 hr capsule TAKE (1) CAPSULE BY MOUTH AT BEDTIME. 08/02/22   Chandrasekhar, Mahesh A, MD  Vitamin D, Cholecalciferol, 25 MCG (1000 UT) TABS Take 1 tablet by mouth daily. 12/31/20   [provider]    Physical Exam: BP 103/67   Pulse 78   Temp 98.1 F (36.7 C)  (Oral)   Resp 19   Ht  (1.575 m)   Wt 74 kg   SpO2 95%   BMI 29.84 kg/m   General: 82 y.o. year-old female well developed well nourished in no acute distress.  Alert and oriented x3. HEENT: NCAT, EOMI Neck: Supple, trachea medial Cardiovascular: Regular rate and rhythm with no rubs or gallops.  No thyromegaly or JVD noted.  No lower extremity edema. 2/4 pulses in all 4 extremities. Respiratory: Clear to auscultation with no wheezes or rales. Good inspiratory effort. Abdomen: Soft, nontender nondistended with normal bowel sounds x4 quadrants. Muskuloskeletal: No cyanosis, clubbing or edema noted bilaterally Neuro: CN II-XII intact, strength 5/5 x 4, sensation, reflexes intact Skin: No ulcerative lesions noted or rashes Psychiatry: Mood is  appropriate for condition and setting          Labs on Admission:  Basic Metabolic Panel: Recent Labs  Lab 10/15/22 2100  NA 135  K 3.3*  CL 102  CO2 26  GLUCOSE 120*  BUN 13  CREATININE 0.71  CALCIUM 8.1*   Liver Function Tests: No results for input(s): "AST", "ALT", "ALKPHOS", "BILITOT", "PROT", "ALBUMIN" in the last 168 hours. No results for input(s): "LIPASE", "AMYLASE" in the last 168 hours. No results for input(s): "AMMONIA" in the last 168 hours. CBC: Recent Labs  Lab 10/15/22 2100  WBC 6.3  HGB 8.3*  HCT 25.6*  MCV 98.8  PLT 212   Cardiac Enzymes: No results for input(s): "CKTOTAL", "CKMB", "CKMBINDEX", "TROPONINI" in the last 168 hours.  BNP (last 3 results) Recent Labs    10/15/22 2100  BNP 474.0*    ProBNP (last 3 results) No results for input(s): "PROBNP" in the last 8760 hours.  CBG: No results for input(s): "GLUCAP" in the last 168 hours.  Radiological Exams on Admission: DG Chest 2 View  Result Date: 10/15/2022 CLINICAL DATA:  Chest pain EXAM: CHEST - 2 VIEW COMPARISON:  X-ray 08/16/2022 and older. FINDINGS: Calcified and tortuous aorta. Normal cardiopericardial silhouette. No consolidation,  pneumothorax or effusion. No edema. Overlapping cardiac leads. Air-fluid level along the stomach beneath the left hemidiaphragm. Degenerative changes of the spine on lateral view. IMPRESSION: No acute cardiopulmonary disease Electronically Signed   By: Karen Kays M.D.   On: 10/15/2022 20:41    EKG: I independently viewed the EKG done and my findings are as followed: Sinus rhythm at a rate of 88 bpm.  LVH with repolarization abnormality  Assessment/Plan Present on Admission:  GI bleed  Symptomatic anemia  Elevated troponin  Essential hypertension  Mixed hyperlipidemia  GERD without esophagitis  Principal Problem:   GI bleed Active Problems:   Essential hypertension   Mixed hyperlipidemia   Symptomatic anemia   Elevated troponin   GERD without esophagitis   Elevated brain natriuretic peptide (BNP) level   Hypokalemia  GI bleed Symptomatic anemia H/H 8.3/25.6, this was 11.4/35.4 on 06/15/2022.   Patient was reported to have melena Of note, patient takes ferrous sulfate  FOBT was positive Type and crossmatch with plan to transfuse 2 units of PRBC was ordered by EDP Continue IV Protonix 40 mg twice daily Gastroenterologist at [facility] (Dr. Elnoria Howard) was consulted by EDP and will consult on patient on arrival to [facility]  Elevated troponin possibly secondary to type II demand ischemia Troponin 318 > 315 Cardiologist at [facility] was already consulted by EDP, please reach out to cardiology team when patient arrives to [facility]  Elevated BNP (chronic) Stable, she denies any shortness of breath EKG personally reviewed shows sinus rhythm at a rate of 88 bpm. LVH with repolarization abnormality  Hypokalemia K+ 3.3, this will be replenished  Essential hypertension BP meds will be held at this time due to soft BP  Mixed hyperlipidemia Continue Lipitor, Zetia  GERD Continue Protonix   DVT prophylaxis: SCDs   Advance Care Planning: Full code  Consults: Cardiology, gastroenterology (by [facility],  EDP)  Family Communication: Son at bedside (all questions answered to satisfaction)  Severity of Illness: The appropriate patient status for this patient is INPATIENT. Inpatient status is judged to be reasonable and necessary in order to provide the required intensity of service to ensure the patient's safety. The patient's presenting symptoms, physical exam findings, and initial radiographic and laboratory data in the context  of their chronic comorbidities is felt to place them at high risk for further clinical deterioration. Furthermore, it is not anticipated that the patient will be medically stable for discharge from the hospital within 2 midnights of admission.   * I certify that at the point of admission it is my clinical judgment that the patient will require inpatient hospital care spanning beyond 2 midnights from the point of admission due to high intensity of service, high risk for further deterioration and high frequency of surveillance required.*  Author: Frankey Shown, DO 10/16/2022 3:38 AM  For on call review [email or web address].

## 2022-10-16 NOTE — ED Notes (Signed)
Pt ambulated to the restroom well and got a urine sample as well.

## 2022-10-16 NOTE — ED Notes (Addendum)
Null and void note

## 2022-10-16 NOTE — ED Notes (Signed)
Pt was given lunch tray.  

## 2022-10-16 NOTE — ED Provider Notes (Signed)
[place] EMERGENCY DEPARTMENT AT [facility] Provider Note   CSN: [id number] Arrival date & time: 10/15/22  2002     History {Add pertinent medical, surgical, social history, OB history to HPI:1} Chief Complaint  Patient presents with   Chest Pain    Dana [name] is a 82 y.o. female.  HPI    Pt comes in with  Home Medications Prior to Admission medications   Medication Sig Start Date End Date Taking? Authorizing Provider  acetaminophen (TYLENOL) 325 MG tablet Take 2 tablets (650 mg total) by mouth every 6 (six) hours as needed for mild pain, fever or headache (or Fever >/= 101). 08/07/19   Shon Hale, MD  alendronate (FOSAMAX) 70 MG tablet Take 70 mg by mouth every Wednesday. 03/15/19   [provider]  ALLERGY RELIEF 10 MG tablet Take 10 mg by mouth daily. 07/26/19   [provider]  allopurinol (ZYLOPRIM) 300 MG tablet Take 300 mg by mouth daily.    [provider]  aspirin EC 81 MG tablet Take 1 tablet (81 mg total) by mouth daily with breakfast. 08/07/19   Mariea Clonts, Courage, MD  atorvastatin (LIPITOR) 10 MG tablet Take 10 mg by mouth daily.    [provider]  benzonatate (TESSALON) 200 MG capsule Take 1 capsule (200 mg total) by mouth 3 (three) times daily as needed for cough. 08/16/22   Triplett, Tammy, PA-C  doxycycline (VIBRAMYCIN) 100 MG capsule Take 100 mg by mouth 2 (two) times daily. 01/24/22   [provider]  ezetimibe (ZETIA) 10 MG tablet Take 10 mg by mouth daily.    [provider]  famotidine (PEPCID) 20 MG tablet Take 1 tablet (20 mg total) by mouth 2 (two) times daily. 01/24/18   Gerhard Munch, MD  ferrous sulfate 325 (65 FE) MG tablet Take 325 mg by mouth daily with breakfast.    [provider]  folic acid (FOLVITE) 1 MG tablet Take 1 mg by mouth daily. 07/26/19   [provider]  furosemide (LASIX) 20 MG tablet Take 20 mg by mouth daily as needed for fluid or edema.     [provider]  methotrexate 2.5 MG tablet Take 20 mg by mouth every Wednesday.     [provider]  mupirocin ointment (BACTROBAN) 2 % as needed for rash. 01/24/22   [provider]  nitroGLYCERIN (NITROSTAT) 0.4 MG SL tablet PLACE 1 TABLET UNDER THE TONGUE EVERY 5 MINUTES AS NEEDED FOR CHEST PAIN 03/05/21   Jonelle Sidle, MD  pantoprazole (PROTONIX) 40 MG tablet Take 1 tablet (40 mg total) by mouth daily. 08/07/19   Shon Hale, MD  sucralfate (CARAFATE) 1 g tablet Take 1 g by mouth 2 (two) times daily. 08/01/19   [provider]  verapamil (VERELAN PM) 360 MG 24 hr capsule TAKE (1) CAPSULE BY MOUTH AT BEDTIME. 08/02/22   Chandrasekhar, Mahesh A, MD  Vitamin D, Cholecalciferol, 25 MCG (1000 UT) TABS Take 1 tablet by mouth daily. 12/31/20   [provider]      Allergies    Patient has no known allergies.    Review of Systems   Review of Systems  Physical Exam Updated Vital Signs BP (!) 91/58   Pulse 87   Temp 99.7 F (37.6 C) (Oral)   Resp 20   Ht  (1.575 m)   Wt 74 kg   SpO2 97%   BMI 29.84 kg/m  Physical Exam  ED Results / Procedures /  Treatments   Labs (all labs ordered are listed, but only abnormal results are displayed) Labs Reviewed  BASIC METABOLIC PANEL - Abnormal; Notable for the following components:      Result Value   Potassium 3.3 (*)    Glucose, Bld 120 (*)    Calcium 8.1 (*)    All other components within normal limits  CBC - Abnormal; Notable for the following components:   RBC 2.59 (*)    Hemoglobin 8.3 (*)    HCT 25.6 (*)    All other components within normal limits  BRAIN NATRIURETIC PEPTIDE - Abnormal; Notable for the following components:   B Natriuretic Peptide 474.0 (*)    All other components within normal limits  TROPONIN I (HIGH SENSITIVITY) - Abnormal; Notable for the following components:   Troponin I (High Sensitivity) 318 (*)    All other components within normal limits  TROPONIN I  (HIGH SENSITIVITY) - Abnormal; Notable for the following components:   Troponin I (High Sensitivity) 315 (*)    All other components within normal limits  LACTIC ACID, PLASMA  LACTIC ACID, PLASMA  POC OCCULT BLOOD, ED  TYPE AND SCREEN  ABO/RH  PREPARE RBC (CROSSMATCH)    EKG EKG Interpretation  Date/Time:  Saturday October 15 2022 20:13:36 EDT Ventricular Rate:  90 PR Interval:  235 QRS Duration: 106 QT Interval:  382 QTC Calculation: 468 R Axis:   1 Text Interpretation: Sinus or ectopic atrial rhythm Prolonged PR interval Consider left atrial enlargement LVH with secondary repolarization abnormality more pronounced ST elevation in anterior leads Nonspecific ST and T wave abnormality Confirmed by Derwood Kaplan [phone]) on 10/15/2022 8:52:23 PM  Radiology DG Chest 2 View  Result Date: 10/15/2022 CLINICAL DATA:  Chest pain EXAM: CHEST - 2 VIEW COMPARISON:  X-ray 08/16/2022 and older. FINDINGS: Calcified and tortuous aorta. Normal cardiopericardial silhouette. No consolidation, pneumothorax or effusion. No edema. Overlapping cardiac leads. Air-fluid level along the stomach beneath the left hemidiaphragm. Degenerative changes of the spine on lateral view. IMPRESSION: No acute cardiopulmonary disease Electronically Signed   By: Karen Kays M.D.   On: 10/15/2022 20:41    Procedures Procedures  {Document cardiac monitor, telemetry assessment procedure when appropriate:1}  Medications Ordered in ED Medications  0.9 %  sodium chloride infusion (Manually program via Guardrails IV Fluids) (has no administration in time range)  sodium chloride 0.9 % bolus 500 mL (0 mLs Intravenous Stopped 10/15/22 2317)  pantoprazole (PROTONIX) injection 80 mg (80 mg Intravenous Given 10/15/22 2317)    ED Course/ Medical Decision Making/ A&P   {   Click here for ABCD2, HEART and other calculatorsREFRESH Note before signing :1}                          Medical Decision Making Amount and/or Complexity of  Data Reviewed Labs: ordered. Radiology: ordered.  Risk Prescription drug management. Decision regarding hospitalization.   ***  {Document critical care time when appropriate:1} {Document review of labs and clinical decision tools ie heart score, Chads2Vasc2 etc:1}  {Document your independent review of radiology images, and any outside records:1} {Document your discussion with family members, caretakers, and with consultants:1} {Document social determinants of health affecting pt's care:1} {Document your decision making why or why not admission, treatments were needed:1} Final Clinical Impression(s) / ED Diagnoses Final diagnoses:  None    Rx / DC Orders ED Discharge Orders     None

## 2022-10-17 ENCOUNTER — Encounter (HOSPITAL_COMMUNITY): Payer: Self-pay | Admitting: Internal Medicine

## 2022-10-17 ENCOUNTER — Other Ambulatory Visit (HOSPITAL_COMMUNITY): Payer: Medicare Other

## 2022-10-17 DIAGNOSIS — I2510 Atherosclerotic heart disease of native coronary artery without angina pectoris: Secondary | ICD-10-CM | POA: Diagnosis not present

## 2022-10-17 DIAGNOSIS — M069 Rheumatoid arthritis, unspecified: Secondary | ICD-10-CM

## 2022-10-17 DIAGNOSIS — I10 Essential (primary) hypertension: Secondary | ICD-10-CM | POA: Diagnosis not present

## 2022-10-17 DIAGNOSIS — R7989 Other specified abnormal findings of blood chemistry: Secondary | ICD-10-CM | POA: Diagnosis not present

## 2022-10-17 DIAGNOSIS — K2100 Gastro-esophageal reflux disease with esophagitis, without bleeding: Secondary | ICD-10-CM

## 2022-10-17 DIAGNOSIS — D649 Anemia, unspecified: Secondary | ICD-10-CM | POA: Diagnosis not present

## 2022-10-17 DIAGNOSIS — K922 Gastrointestinal hemorrhage, unspecified: Secondary | ICD-10-CM | POA: Diagnosis not present

## 2022-10-17 DIAGNOSIS — I421 Obstructive hypertrophic cardiomyopathy: Secondary | ICD-10-CM | POA: Diagnosis not present

## 2022-10-17 DIAGNOSIS — E876 Hypokalemia: Secondary | ICD-10-CM | POA: Diagnosis not present

## 2022-10-17 DIAGNOSIS — K219 Gastro-esophageal reflux disease without esophagitis: Secondary | ICD-10-CM

## 2022-10-17 LAB — TYPE AND SCREEN
ABO/RH(D): A POS
Antibody Screen: NEGATIVE
Unit division: 0

## 2022-10-17 LAB — BASIC METABOLIC PANEL
Anion gap: 9 (ref 5–15)
BUN: 8 mg/dL (ref 8–23)
CO2: 23 mmol/L (ref 22–32)
Calcium: 8.1 mg/dL — ABNORMAL LOW (ref 8.9–10.3)
Chloride: 104 mmol/L (ref 98–111)
Creatinine, Ser: 0.73 mg/dL (ref 0.44–1.00)
GFR, Estimated: >60 mL/min (ref >60)
Glucose, Bld: 100 mg/dL — ABNORMAL HIGH (ref 70–99)
Potassium: 3.9 mmol/L (ref 3.5–5.1)
Sodium: 136 mmol/L (ref 135–145)

## 2022-10-17 LAB — BPAM RBC
Blood Product Expiration Date: 202405212359
ISSUE DATE / TIME: 202404210254

## 2022-10-17 LAB — POC OCCULT BLOOD, ED: Fecal Occult Bld: POSITIVE — AB

## 2022-10-17 MED ORDER — PEG-KCL-NACL-NASULF-NA ASC-C 100 G PO SOLR: 1.0000 | Freq: Once | ORAL | Status: DC

## 2022-10-17 MED ORDER — VERAPAMIL HCL ER 180 MG PO TBCR
360.0000 mg | EXTENDED_RELEASE_TABLET | Freq: Every day | ORAL | Status: DC
  Administered 2022-10-17 – 2022-10-18 (×2): 360 mg via ORAL
  Filled 2022-10-17 (×2): qty 2

## 2022-10-17 MED ORDER — SODIUM CHLORIDE 0.9 % IV SOLN: INTRAVENOUS | Status: DC

## 2022-10-17 MED ORDER — PEG-KCL-NACL-NASULF-NA ASC-C 100 G PO SOLR
0.5000 | Freq: Once | ORAL | Status: AC
  Administered 2022-10-17: 100 g via ORAL

## 2022-10-17 MED ORDER — PEG-KCL-NACL-NASULF-NA ASC-C 100 G PO SOLR
0.5000 | Freq: Once | ORAL | Status: AC
  Administered 2022-10-17: 100 g via ORAL
  Filled 2022-10-17: qty 1

## 2022-10-17 NOTE — Care Management (Signed)
  Transition of Care (TOC) Screening Note   Patient Details  Name: Dana [name] Date of Birth: 15-Mar-1941   Transition of Care [facility]) CM/SW Contact:    Gala Lewandowsky, RN Phone Number: 10/17/2022, 3:21 PM    Transition of Care Department [facility]) has reviewed the patient and no TOC needs have been identified at this time. Patient presented for chest pain and GI Bleed. Patient received two units PRBC's per staff RN. TOC will continue to monitor patient advancement through interdisciplinary progression rounds. If new patient transition needs arise, please place a TOC consult.

## 2022-10-17 NOTE — Progress Notes (Signed)
The patient speaks Samoen. So far, I have not been able to acquire a translator to help speak with her. Her son-n-law and daughter is the Nurse, learning disability. The daughter works today and the son-in-law is at his hd appt right now 4A. Family would like to schedule rounds if possible.

## 2022-10-17 NOTE — Consult Note (Signed)
Consultation Note   Referring Provider:  [facility] PCP: [name], MD Primary Gastroenterologist: Gentry Fitz        Reason for consultation: unassigned  Hospital Day: 3   Assessment   82 yo female with a history of hypertrophic obstructive cardiomyopathy / CAD s/p remote stent placement admitted with chest pain / elevated troponin / acute on chronic anemia Evaluated by Cardiology - elevated troponin felt to be demand ischemic in setting of GI bleed though CAD / microvascular disease a consideration. Plan is for cath after GI evaluation   Intermittent dark , Heme + stools ( on iron). Rule out PUD, AVMs,  gastrointestinal neoplasm  -Hgb 8.3 ( baseline 11.4), Improved with transfusion  Per family she has been on oral iron at home so anemia history unknown though sounds like she has never had an endoscopic evaluation.   Rheumatoid arthritis, on Methotrexate  GERD, on PPI and BID famotidine  Plan   - The risks and benefits of EGD , +/- colonoscopy including possible biopsies and polypectomy were discussed with the son-in -law (who serves as interpreter). The patient agrees to proceed.  -Trend hgb -Will place on clear liquid diet in case we decided to pursue colonoscopy tomorrow.    History of Present Illness   Patient is a 82 y.o. year old female with a past medical history of  hypertension, hyperlipidemia, hypertrophic obstructive cardiomyopathy, CAD s/p stent placement, GERD, RA on methotrexate .    See PMH for any additional medical problems.  ED WORKUP:   Patient presented to the [facility] ED yesterday with chest pain.  She also endorsed several months of black stool.  In the ED her troponins were elevated, BP slightly low at 99/72, hemoglobin 8.3 (baseline 11.4).  MCV 95  Due to elevated troponin and chest pain Cardiology recommended she come to [facility] for further evaluation  INTERVAL HISTORY : Son in law in room and  interpreting. Off and on dark stools since July but sounds like she takes iron at home. Patient hasn't had any complaints of abdominal pain , N/V and her weight has been stable. She takes BID Pepcid and daily PPI. She is on a daily baby asa but no other NSAIDS. No history of any endoscopic evaluation as far as son -in-law knows.   Unknown if has family history of CRC  Imaging and Labs    Recent Labs    10/15/22 2100 10/16/22 0707 10/16/22 2331  WBC 6.3 5.0  --   HGB 8.3* 9.3* 9.2*  HCT 25.6* 28.4* 28.5*  PLT 212 156  --    Recent Labs    10/15/22 2100 10/16/22 0707  NA 135 136  K 3.3* 3.2*  CL 102 104  CO2 26 26  GLUCOSE 120* 95  BUN 13 12  CREATININE 0.71 0.68  CALCIUM 8.1* 7.8*   Recent Labs    10/16/22 0707  PROT 5.9*  ALBUMIN 2.3*  AST 21  ALT 13  ALKPHOS 83  BILITOT 0.8   No results for input(s): "HEPBSAG", "HCVAB", "HEPAIGM", "HEPBIGM" in the last 72 hours. No results for input(s): "LABPROT", "INR" in the last 72 hours.  Past Medical History:  Diagnosis Date   CAD (coronary artery disease)  a. s/p DES to OM1 in 02/2017   Chest pain    Essential hypertension    Gout    HOCM (hypertrophic obstructive cardiomyopathy)    Hyperlipidemia    Rheumatoid arthritis     Past Surgical History:  Procedure Laterality Date   CHOLECYSTECTOMY N/A 08/17/2018   Procedure: LAPAROSCOPIC CHOLECYSTECTOMY;  Surgeon: [name], MD;  Location: [facility];  Service: General;  Laterality: N/A;   CORONARY BALLOON ANGIOPLASTY N/A 03/03/2017   Procedure: CORONARY BALLOON ANGIOPLASTY;  Surgeon: Marykay Lex, MD;  Location: [facility] INVASIVE CV LAB;  Service: Cardiovascular;  Laterality: N/A;   CORONARY STENT INTERVENTION N/A 03/03/2017   Procedure: CORONARY STENT INTERVENTION;  Surgeon: Marykay Lex, MD;  Location: [facility] INVASIVE CV LAB;  Service: Cardiovascular;  Laterality: N/A;   LEFT HEART CATH AND CORONARY ANGIOGRAPHY N/A 03/02/2017   Procedure: LEFT HEART CATH AND CORONARY  ANGIOGRAPHY;  Surgeon: Tonny Bollman, MD;  Location: [facility] INVASIVE CV LAB;  Service: Cardiovascular;  Laterality: N/A;    Family History  Problem Relation Age of Onset   Hypertension Mother    Liver disease Mother    Alcohol abuse Father     Prior to Admission medications   Medication Sig Start Date End Date Taking? Authorizing Provider  acetaminophen (TYLENOL) 325 MG tablet Take 2 tablets (650 mg total) by mouth every 6 (six) hours as needed for mild pain, fever or headache (or Fever >/= 101). 08/07/19  Yes Shon Hale, MD  alendronate (FOSAMAX) 70 MG tablet Take 70 mg by mouth every Wednesday. 03/15/19  Yes [provider]  ALLERGY RELIEF 10 MG tablet Take 10 mg by mouth daily. 07/26/19  Yes [provider]  allopurinol (ZYLOPRIM) 300 MG tablet Take 300 mg by mouth daily.   Yes [provider]  atorvastatin (LIPITOR) 10 MG tablet Take 10 mg by mouth daily.   Yes [provider]  ezetimibe (ZETIA) 10 MG tablet Take 10 mg by mouth daily.   Yes [provider]  famotidine (PEPCID) 20 MG tablet Take 1 tablet (20 mg total) by mouth 2 (two) times daily. 01/24/18  Yes Gerhard Munch, MD  ferrous sulfate 325 (65 FE) MG tablet Take 325 mg by mouth daily with breakfast.   Yes [provider]  folic acid (FOLVITE) 1 MG tablet Take 1 mg by mouth daily. 07/26/19  Yes [provider]  furosemide (LASIX) 20 MG tablet Take 10 mg by mouth daily.   Yes [provider]  methotrexate 2.5 MG tablet Take 20 mg by mouth every Wednesday.    Yes [provider]  nitroGLYCERIN (NITROSTAT) 0.4 MG SL tablet PLACE 1 TABLET UNDER THE TONGUE EVERY 5 MINUTES AS NEEDED FOR CHEST PAIN Patient taking differently: Place 0.4 mg under the tongue every 5 (five) minutes as needed for chest pain. 03/05/21  Yes Jonelle Sidle, MD  pantoprazole (PROTONIX) 40 MG tablet Take 1 tablet (40 mg total) by mouth daily. 08/07/19  Yes Emokpae, Courage, MD   sucralfate (CARAFATE) 1 g tablet Take 1 g by mouth 2 (two) times daily. 08/01/19  Yes [provider]  verapamil (VERELAN PM) 360 MG 24 hr capsule TAKE (1) CAPSULE BY MOUTH AT BEDTIME. Patient taking differently: Take 360 mg by mouth at bedtime. 08/02/22  Yes Chandrasekhar, Mahesh A, MD  Vitamin D, Cholecalciferol, 25 MCG (1000 UT) TABS Take 1 tablet by mouth daily. 12/31/20  Yes [provider]  aspirin EC 81 MG tablet Take 1 tablet (81 mg  total) by mouth daily with breakfast. 08/07/19   Shon Hale, MD  benzonatate (TESSALON) 200 MG capsule Take 1 capsule (200 mg total) by mouth 3 (three) times daily as needed for cough. Patient not taking: Reported on 10/16/2022 08/16/22   Pauline Aus, PA-C  doxycycline (VIBRAMYCIN) 100 MG capsule Take 100 mg by mouth 2 (two) times daily. Patient not taking: Reported on 10/16/2022 01/24/22   [provider]  folic acid (FOLVITE) 1 MG tablet TAKE 1 TABLET BY MOUTH ONCE A DAY FOR SUPPLEMENT DUE TO METHOTREXATE. Orally Once a day for 90 days Patient not taking: Reported on 10/16/2022    [provider]  mupirocin ointment (BACTROBAN) 2 % as needed for rash. Patient not taking: Reported on 10/16/2022 01/24/22   [provider]    Current Facility-Administered Medications  Medication Dose Route Frequency Provider Last Rate Last Admin   acetaminophen (TYLENOL) tablet 650 mg  650 mg Oral Q6H PRN Adefeso, Oladapo, DO       Or   acetaminophen (TYLENOL) suppository 650 mg  650 mg Rectal Q6H PRN Adefeso, Oladapo, DO       atorvastatin (LIPITOR) tablet 10 mg  10 mg Oral Daily Adefeso, Oladapo, DO   10 mg at 10/17/22 0816   ezetimibe (ZETIA) tablet 10 mg  10 mg Oral Daily Adefeso, Oladapo, DO   10 mg at 10/17/22 0816   ferrous sulfate tablet 325 mg  325 mg Oral Q breakfast Adefeso, Oladapo, DO   325 mg at 10/17/22 0816   ondansetron (ZOFRAN) tablet 4 mg  4 mg Oral Q6H PRN Adefeso, Oladapo, DO       Or   ondansetron (ZOFRAN)  injection 4 mg  4 mg Intravenous Q6H PRN Adefeso, Oladapo, DO       pantoprazole (PROTONIX) injection 40 mg  40 mg Intravenous Q12H Adefeso, Oladapo, DO   40 mg at 10/17/22 0816    Allergies as of 10/15/2022   (No Known Allergies)    Social History   Socioeconomic History   Marital status: Widowed    Spouse name: Not on file   Number of children: Not on file   Years of education: Not on file   Highest education level: Not on file  Occupational History   Not on file  Tobacco Use   Smoking status: Never   Smokeless tobacco: Never  Vaping Use   Vaping Use: Never used  Substance and Sexual Activity   Alcohol use: No   Drug use: No   Sexual activity: Not on file  Other Topics Concern   Not on file  Social History Narrative   Not on file   Social Determinants of Health   Financial Resource Strain: Not on file  Food Insecurity: Not on file  Transportation Needs: Not on file  Physical Activity: Not on file  Stress: Not on file  Social Connections: Not on file  Intimate Partner Violence: Not on file    Review of Systems: All systems reviewed and negative except where noted in HPI.  Physical Exam: Vital signs in last 24 hours: Temp:  [98 F (36.7 C)-98.2 F (36.8 C)] 98.2 F (36.8 C) (04/22 1153) Pulse Rate:  [78-87] 84 (04/22 1153) Resp:  [16-20] 17 (04/22 1153) BP: (110-137)/(67-83) 137/67 (04/22 1153) SpO2:  [98 %] 98 % (04/22 0512) Weight:  [70.3 kg] 70.3 kg (04/21 2054)    General:  Alert female in NAD Psych:  Pleasant, cooperative. Normal mood and affect Eyes: Pupils equal Ears:  Normal auditory acuity Nose: No deformity, discharge or lesions Neck:  Supple, no masses felt Lungs:  Clear to auscultation.  Heart:  Regular rate, regular rhythm.  Abdomen:  Soft, nondistended, nontender, active bowel sounds, no masses felt Rectal :  Deferred Msk: Symmetrical without gross deformities.  Neurologic:  Alert, oriented, grossly normal neurologically Extremities  : No edema Skin:  Intact without significant lesions.    Intake/Output from previous day: No intake/output data recorded. Intake/Output this shift:  No intake/output data recorded.    Principal Problem:   GI bleed Active Problems:   Essential hypertension   Mixed hyperlipidemia   Symptomatic anemia   Elevated troponin   GERD without esophagitis   Elevated brain natriuretic peptide (BNP) level   Hypokalemia    Willette Cluster, NP-C @  10/17/2022, 12:09 PM

## 2022-10-17 NOTE — Consult Note (Addendum)
Cardiology Consultation   Patient ID: Dana [name] MRN: [id number]; DOB: 02-Sep-1940  Admit date: 10/15/2022 Date of Consult: 10/17/2022  PCP:  [name], MD    [facility] Providers Cardiologist:  Nona Dell, MD       HOCM followed by Dr. Izora Ribas Patient Profile:   Dana [name] is a 82 y.o. female with a hx of CAD s/p PCI OM1 and HOCM, with preserved LVEF and LVOT gradient of 100 mmHg now on AV nodal therapy who was admitted 10/15/22 with chest pain and is being seen 10/17/2022 for the evaluation of elevated troponins with angina at the request of Dr. Blake Divine.  Daughter interpreted for [place].  History of Present Illness:   Dana [name], Dana [name], only interpreter has been family.   She has a hx of HTN, HLD, CAD with stent placement in 2018 to 1st OM for 90% stenosis, post intervention 0% stenosis, lat 1st OM 60% stenosed bifurcation lesion jailed by the initial stent undergoing rescue PTCA with 20% stenosis. Residual disease of LM 40%, LAD mid 50%. Mild RCA disease.   Her HOCM (septal variant) with preserved LVEF and LVOT gradient of 100.   She was unable to do stress test due to severe gradient.  In 2023 she wore a monitor and no atrial fib no NSVT.  05/10/22 underwent cardiac PET scan. The study is intermediate risk due to the presence of decreased global myocardial blood flow reserve which may indicate the presence of microvascular disease, however, cannot rule out underlying obstructive CAD given the presence of extensive coronary calcification. Rest EF 55%, stress EF 60%. Coronary calcium was present on the attenuation correction CT images. Severe coronary calcifications were present. Coronary calcifications were present in the left anterior descending artery, left circumflex artery and right coronary artery distribution(s).  Dr. Izora Ribas noted if worsening CP would bring back earlier with evidence of microvascular flow abnormalities and CAC.   Last Echo 08/30/22 with LVOT  gradient improved from 80 to 49. EF 70-75%. LV with hyperdynamic function. No RWMA severe asymmetric LV hypertrophy. LA mildly dilated.  Mild MR.    Pt presented to ER 09/2022 with chest pain for 2 days and no SOB. On initial exam difficult for pt to describe pain.  Per her son 2 days prior to admit she developed chest pain, intermittent Lt parasternal.  Also black stool.  She noted several months of black stool.  Her BP was 99/72 and she was given NS 500 cc.  With her chest pain no nausea, vomiting, radiation or diaphoresis.  In ER pain resolved and has not returned.   BNP 474 Hs troponin 318; 315; 356; 846 Lactic acid 1.6, 1.5  Na 135, k+ 3.3, Cr 0.71 today Cr 0.68, K+ 3.2 and Mg+ 1.8  Na 136  WBC 6.3, Hgb 8.3 plts 212  she rec'd 1 unit PRBCs hgb today 9.2  Stool heme +   CXR with no acute cardiopulmonary disease EKG:  The EKG was personally reviewed and demonstrates:  SR with LVH ST mildly elevated in ant leads new from old EKG 02/01/22  follow up EKG with mild ST elevation in V1 alone  Telemetry:  Telemetry was personally reviewed and demonstrates:  SR with occ PVC  BP 116/69 P 78 R 176 afebrile (BP down to 63/53 yesterday) GI is to see     Past Medical History:  Diagnosis Date   CAD (coronary artery disease)    a. s/p DES to OM1 in 02/2017   Chest pain  Essential hypertension    Gout    HOCM (hypertrophic obstructive cardiomyopathy)    Hyperlipidemia    Rheumatoid arthritis     Past Surgical History:  Procedure Laterality Date   CHOLECYSTECTOMY N/A 08/17/2018   Procedure: LAPAROSCOPIC CHOLECYSTECTOMY;  Surgeon: [name], MD;  Location: [facility];  Service: General;  Laterality: N/A;   CORONARY BALLOON ANGIOPLASTY N/A 03/03/2017   Procedure: CORONARY BALLOON ANGIOPLASTY;  Surgeon: Marykay Lex, MD;  Location: [facility] INVASIVE CV LAB;  Service: Cardiovascular;  Laterality: N/A;   CORONARY STENT INTERVENTION N/A 03/03/2017   Procedure: CORONARY STENT INTERVENTION;  Surgeon:  Marykay Lex, MD;  Location: [facility] INVASIVE CV LAB;  Service: Cardiovascular;  Laterality: N/A;   LEFT HEART CATH AND CORONARY ANGIOGRAPHY N/A 03/02/2017   Procedure: LEFT HEART CATH AND CORONARY ANGIOGRAPHY;  Surgeon: Tonny Bollman, MD;  Location: [facility] INVASIVE CV LAB;  Service: Cardiovascular;  Laterality: N/A;     Home Medications:  Prior to Admission medications   Medication Sig Start Date End Date Taking? Authorizing Provider  acetaminophen (TYLENOL) 325 MG tablet Take 2 tablets (650 mg total) by mouth every 6 (six) hours as needed for mild pain, fever or headache (or Fever >/= 101). 08/07/19  Yes Shon Hale, MD  alendronate (FOSAMAX) 70 MG tablet Take 70 mg by mouth every Wednesday. 03/15/19  Yes [provider]  ALLERGY RELIEF 10 MG tablet Take 10 mg by mouth daily. 07/26/19  Yes [provider]  allopurinol (ZYLOPRIM) 300 MG tablet Take 300 mg by mouth daily.   Yes [provider]  atorvastatin (LIPITOR) 10 MG tablet Take 10 mg by mouth daily.   Yes [provider]  ezetimibe (ZETIA) 10 MG tablet Take 10 mg by mouth daily.   Yes [provider]  famotidine (PEPCID) 20 MG tablet Take 1 tablet (20 mg total) by mouth 2 (two) times daily. 01/24/18  Yes Gerhard Munch, MD  ferrous sulfate 325 (65 FE) MG tablet Take 325 mg by mouth daily with breakfast.   Yes [provider]  folic acid (FOLVITE) 1 MG tablet Take 1 mg by mouth daily. 07/26/19  Yes [provider]  furosemide (LASIX) 20 MG tablet Take 10 mg by mouth daily.   Yes [provider]  methotrexate 2.5 MG tablet Take 20 mg by mouth every Wednesday.    Yes [provider]  nitroGLYCERIN (NITROSTAT) 0.4 MG SL tablet PLACE 1 TABLET UNDER THE TONGUE EVERY 5 MINUTES AS NEEDED FOR CHEST PAIN Patient taking differently: Place 0.4 mg under the tongue every 5 (five) minutes as needed for chest pain. 03/05/21  Yes Jonelle Sidle, MD  pantoprazole (PROTONIX) 40  MG tablet Take 1 tablet (40 mg total) by mouth daily. 08/07/19  Yes Emokpae, Courage, MD  sucralfate (CARAFATE) 1 g tablet Take 1 g by mouth 2 (two) times daily. 08/01/19  Yes [provider]  verapamil (VERELAN PM) 360 MG 24 hr capsule TAKE (1) CAPSULE BY MOUTH AT BEDTIME. Patient taking differently: Take 360 mg by mouth at bedtime. 08/02/22  Yes Chandrasekhar, Mahesh A, MD  Vitamin D, Cholecalciferol, 25 MCG (1000 UT) TABS Take 1 tablet by mouth daily. 12/31/20  Yes [provider]  aspirin EC 81 MG tablet Take 1 tablet (81 mg total) by mouth daily with breakfast. 08/07/19   Emokpae, Courage, MD  benzonatate (TESSALON) 200 MG capsule Take 1 capsule (200 mg total) by mouth 3 (three) times daily as needed for cough. Patient not taking: Reported  on 10/16/2022 08/16/22   Pauline Aus, PA-C  doxycycline (VIBRAMYCIN) 100 MG capsule Take 100 mg by mouth 2 (two) times daily. Patient not taking: Reported on 10/16/2022 01/24/22   [provider]  folic acid (FOLVITE) 1 MG tablet TAKE 1 TABLET BY MOUTH ONCE A DAY FOR SUPPLEMENT DUE TO METHOTREXATE. Orally Once a day for 90 days Patient not taking: Reported on 10/16/2022    [provider]  mupirocin ointment (BACTROBAN) 2 % as needed for rash. Patient not taking: Reported on 10/16/2022 01/24/22   [provider]    Inpatient Medications: Scheduled Meds:  atorvastatin  10 mg Oral Daily   ezetimibe  10 mg Oral Daily   ferrous sulfate  325 mg Oral Q breakfast   pantoprazole (PROTONIX) IV  40 mg Intravenous Q12H   Continuous Infusions:  PRN Meds: acetaminophen **OR** acetaminophen, ondansetron **OR** ondansetron (ZOFRAN) IV  Allergies:   No Known Allergies  Social History:   Social History   Socioeconomic History   Marital status: Widowed    Spouse name: Not on file   Number of children: Not on file   Years of education: Not on file   Highest education level: Not on file  Occupational History   Not on file   Tobacco Use   Smoking status: Never   Smokeless tobacco: Never  Vaping Use   Vaping Use: Never used  Substance and Sexual Activity   Alcohol use: No   Drug use: No   Sexual activity: Not on file  Other Topics Concern   Not on file  Social History Narrative   Not on file   Social Determinants of Health   Financial Resource Strain: Not on file  Food Insecurity: Not on file  Transportation Needs: Not on file  Physical Activity: Not on file  Stress: Not on file  Social Connections: Not on file  Intimate Partner Violence: Not on file    Family History:    Family History  Problem Relation Age of Onset   Hypertension Mother    Liver disease Mother    Alcohol abuse Father     For genetic testing for eval of sons and daughter planned for 11/2022.  ROS:  Please see the history of present illness.  General:no colds or fevers, no weight changes Skin:no rashes or ulcers HEENT:no blurred vision, no congestion CV:see HPI PUL:see HPI GI:no diarrhea constipation or melena, no indigestion GU:no hematuria, no dysuria MS:no joint pain, no claudication, + RA , hx of gout Neuro:no syncope, no lightheadedness Endo:no diabetes, no thyroid disease  All other ROS reviewed and negative.     Physical Exam/Data:   Vitals:   10/16/22 1000 10/16/22 1037 10/16/22 2054 10/17/22 0512  BP: 111/68  110/83 116/69  Pulse: 78  87 78  Resp: (!) 26  20 16   Temp:  98.1 F (36.7 C) 98 F (36.7 C) 98 F (36.7 C)  TempSrc:  Oral Oral Oral  SpO2: 100%  98% 98%  Weight:   70.3 kg   Height:        Intake/Output Summary (Last 24 hours) at 10/17/2022 1152 Last data filed at 10/17/2022 0403 Gross per 24 hour  Intake 0 ml  Output --  Net 0 ml      10/16/2022    8:54 PM 10/15/2022    8:14 PM 09/06/2022   11:03 AM  Last 3 Weights  Weight (lbs) 154 lb 14.4 oz 163 lb 2.3 oz 158 lb 12.8 oz  Weight (kg)  70.262 kg 74 kg 72.031 kg     Body mass index is 28.33 kg/m.  General:  Well nourished, well  developed, in no acute distress HEENT: normal Neck: no JVD Vascular: No carotid bruits; Distal pulses 2+ bilaterally Cardiac:  normal S1, S2; RRR; 3/6 systolic murmur heard through to her back, no gallup or rub Lungs:  clear to auscultation bilaterally, no wheezing, rhonchi or rales  Abd: soft, nontender, no hepatomegaly  Ext: no edema Musculoskeletal:  No deformities, BUE and BLE strength normal and equal Skin: warm and dry  Neuro:  alert and oriented X 3, no focal abnormalities noted Psych:  Normal affect   Relevant CV Studies: Cardiac PET 05/10/22   IMPRESSION: 1. Mediastinal nodal enlargement along the RIGHT paratracheal chain similar to February of 2023 and little changed compared to the study of August of 2018. New top-normal AP window lymph node size in generalized increase in number of lymph nodes in the chest. This finding can be seen in the setting of heart failure and should be correlated clinically. Given size of lymph nodes could consider 3 to six-month follow-up to ensure stability. 2. Aortic atherosclerosis and coronary artery calcification.   Aortic Atherosclerosis (ICD10-I70.0).   Echo 08/30/22 IMPRESSIONS   1. LVOT is narrow (14 mm) Turbulent flow through the LVOT With valsalva  gradient was 49 mm Hg (3.5 m/sec) Compared to echo from April 2023  gradient is improved (80 to 49 mm Hg) . Left ventricular ejection  fraction, by estimation, is 70 to 75%. The left  ventricle has hyperdynamic function. The left ventricle has no regional  wall motion abnormalities. There is severe asymmetric left ventricular  hypertrophy.   2. Right ventricular systolic function is normal. The right ventricular  size is normal. There is normal pulmonary artery systolic pressure.   3. Left atrial size was mildly dilated.   4. Systolic anterior motion of the anterior and posterior mitral  leaflets.. Mild mitral valve regurgitation.   5. Poor acoustic windows limiit study AV is  thickened, calcified with  mildly restricted motion. Difficult to assess for valve gradients with  concomitant LVOT obstruction. The aortic valve is abnormal. Aortic valve  regurgitation is not visualized.   6. The inferior vena cava is normal in size with greater than 50%  respiratory variability, suggesting right atrial pressure of 3 mmHg.   FINDINGS   Left Ventricle: LVOT is narrow (14 mm) Turbulent flow through the LVOT  With valsalva gradient was 49 mm Hg (3.5 m/sec) Compared to echo from  April 2023 gradient is improved (80 to 49 mm Hg). Left ventricular  ejection fraction, by estimation, is 70 to  75%. The left ventricle has hyperdynamic function. The left ventricle has  no regional wall motion abnormalities. The left ventricular internal  cavity size was normal in size. There is severe asymmetric left  ventricular hypertrophy.   Right Ventricle: The right ventricular size is normal. Right vetricular  wall thickness was not assessed. Right ventricular systolic function is  normal. There is normal pulmonary artery systolic pressure. The tricuspid  regurgitant velocity is 2.76 m/s,  and with an assumed right atrial pressure of 3 mmHg, the estimated right  ventricular systolic pressure is 33.5 mmHg.   Left Atrium: Left atrial size was mildly dilated.   Right Atrium: Right atrial size was normal in size.   Pericardium: There is no evidence of pericardial effusion.   Mitral Valve: Systolic anterior motion of the anterior and posterior  mitral leaflets. There  is mild thickening of the mitral valve leaflet(s).  Mild mitral annular calcification. Mild mitral valve regurgitation.   Tricuspid Valve: The tricuspid valve is normal in structure. Tricuspid  valve regurgitation is trivial.   Aortic Valve: Poor acoustic windows limiit study AV is thickened,  calcified with mildly restricted motion. Difficult to assess for valve  gradients with concomitant LVOT obstruction. The aortic  valve is abnormal.  Aortic valve regurgitation is not  visualized. Aortic valve mean gradient measures 25.0 mmHg. Aortic valve  peak gradient measures 42.0 mmHg.   Pulmonic Valve: The pulmonic valve was not well visualized. Pulmonic valve  regurgitation is not visualized. No evidence of pulmonic stenosis.   Aorta: The aortic root is normal in size and structure.   Venous: The inferior vena cava is normal in size with greater than 50%  respiratory variability, suggesting right atrial pressure of 3 mmHg.   IAS/Shunts: No atrial level shunt detected by color flow Doppler.     LEFT VENTRICLE  PLAX 2D  LVIDd:         3.50 cm   Diastology  LVIDs:         2.00 cm   LV e' medial:    3.81 cm/s  LV PW:         1.40 cm   LV E/e' medial:  21.3  LV IVS:        2.10 cm   LV e' lateral:   9.14 cm/s  LVOT diam:     1.40 cm   LV E/e' lateral: 8.9  LVOT Area:     1.54 cm     RIGHT VENTRICLE  RV S prime:     10.00 cm/s  TAPSE (M-mode): 1.6 cm   LEFT ATRIUM             Index        RIGHT ATRIUM           Index  LA diam:        4.10 cm 2.24 cm/m   RA Area:     15.80 cm  LA Vol (A2C):   61.7 ml 33.75 ml/m  RA Volume:   41.90 ml  22.92 ml/m  LA Vol (A4C):   57.1 ml 31.24 ml/m  LA Biplane Vol: 61.0 ml 33.37 ml/m   AORTIC VALVE  AV Area (Vmax): 1.60 cm  AV Vmax:        324.00 cm/s  AV Vmean:       231.000 cm/s  AV VTI:         0.719 m  AV Peak Grad:   42.0 mmHg  AV Mean Grad:   25.0 mmHg  LVOT Vmax:      336.00 cm/s  Laboratory Data:  High Sensitivity Troponin:   Recent Labs  Lab 10/15/22 2100 10/15/22 2301 10/16/22 0707 10/16/22 1458  TROPONINIHS 318* 315* 356* 846*     Chemistry Recent Labs  Lab 10/15/22 2100 10/16/22 0707  NA 135 136  K 3.3* 3.2*  CL 102 104  CO2 26 26  GLUCOSE 120* 95  BUN 13 12  CREATININE 0.71 0.68  CALCIUM 8.1* 7.8*  MG  --  1.8  GFRNONAA >60 >60  ANIONGAP 7 6    Recent Labs  Lab 10/16/22 0707  PROT 5.9*  ALBUMIN 2.3*  AST 21  ALT 13   ALKPHOS 83  BILITOT 0.8   Lipids No results for input(s): "CHOL", "TRIG", "HDL", "LABVLDL", "LDLCALC", "CHOLHDL" in the last 168 hours.  Hematology Recent Labs  Lab 10/15/22 2100 10/16/22 0707 10/16/22 2331  WBC 6.3 5.0  --   RBC 2.59* 2.96*  --   HGB 8.3* 9.3* 9.2*  HCT 25.6* 28.4* 28.5*  MCV 98.8 95.9  --   MCH 32.0 31.4  --   MCHC 32.4 32.7  --   RDW 15.3 17.0*  --   PLT 212 156  --    Thyroid No results for input(s): "TSH", "FREET4" in the last 168 hours.  BNP Recent Labs  Lab 10/15/22 2100  BNP 474.0*    DDimer No results for input(s): "DDIMER" in the last 168 hours.   Radiology/Studies:  DG Chest 2 View  Result Date: 10/15/2022 CLINICAL DATA:  Chest pain EXAM: CHEST - 2 VIEW COMPARISON:  X-ray 08/16/2022 and older. FINDINGS: Calcified and tortuous aorta. Normal cardiopericardial silhouette. No consolidation, pneumothorax or effusion. No edema. Overlapping cardiac leads. Air-fluid level along the stomach beneath the left hemidiaphragm. Degenerative changes of the spine on lateral view. IMPRESSION: No acute cardiopulmonary disease Electronically Signed   By: Karen Kays M.D.   On: 10/15/2022 20:41     Assessment and Plan:   Chest pain with elevated troponin, initially flat in 300s, but now 846.  In combination with GI bleed Heme + stools and has rec'd PRBCs with hypotension initially.  With her HOCM the hypotension may have caused demand ischemia.  No further chest pain.  Will do limited echo and if no changes treat medically CAD with prior stent to OM1 and PTCA for jailed area from stent. Residual disease 40% LM and 50% LAD.  Mild disease in RCA.  No ischemia on cardiac PET in 04/2022. + calcifications and microvascular disease (vs obstructive CAD with balanced ischemia).   Mention of adding BB but with hypotension unable to do at this time.   ASA on hold  HOCM (septal variant) with preserved LVEF and LVOT gradient of 100.   She was unable to do stress test due to  severe gradient.  Recent echo 08/30/22 gradient improved to 49 mm Hg EF 70-75% There is severe asymmetric left ventricular hypertrophy.  Associated MR. She has not been interested in SRT  BNP elevated with NAD on CXR. Pt euvolemic currently   Hypotension with IV fluids and PRBCs improved, holding lasix 10 mg daily, verapamil 360 mg daily,  BP improved at this time will resume verapamil.   GI bleed - GI to see, HEME + stool, and was/is on iron  HLD on zetia and lipitor continue HTN now lower- as outpt difficult to add BB with soft BP  Verapamil on hold along with lasix. Continue to hold lasix.   Risk Assessment/Risk Scores:     TIMI Risk Score for Unstable Angina or Non-ST Elevation MI:   The patient's TIMI risk score is 5, which indicates a 26% risk of all cause mortality, new or recurrent myocardial infarction or need for urgent revascularization in the next 14 days.    For questions or updates, please contact [facility] Please consult [email or web address] for contact info under    Signed, Nada Boozer, NP  10/17/2022 11:52 AM    Patient seen and examined.  Agree with above documentation.  Dana [name] is an 82 year old female with a history of HOCM, CAD status post PCI to OM1 who we are consulted by Dr. Blake Divine for evaluation of chest pain and troponin elevation.  Most recent cath in 2018 showed 40% ostial left main stenosis, 90% OM1 stenosis, 60% stenosis  in sidebranch off OM1 that was jailed by initial stent.  She underwent stress PET 05/10/2022 which showed normal perfusion but significant decrease in global myocardial blood flow reserve (1.29), which could suggest either severe multivessel disease or microvascular dysfunction.  Most recent echocardiogram 08/2022 showed improvement in LVOT gradients (80 to 49 mmHg), LVEF 70 to 75%, severe asymmetric LV hypertrophy, normal RV function, mild mitral regurgitation, limited evaluation of aortic valve difficult to assess for valve gradients given  concomitant LVOT obstruction.  She presented to the ED 10/16/2022 with chest pain.  Initial vital signs notable for BP 89/58, pulse 83, SpO2 94% on room air.  Labs notable for potassium 3.3, creatinine 0.7, BNP 474, troponin 318 > 315 > 356 > 846, lactate 1.6, hemoglobin 8.3 (down from 11.4 on 06/23/2022).  She reported recent dark stools.  FOBT was positive.  GI has been consulted.  EKG showed sinus rhythm, LVH with reports age abnormalities, rate 88, QTc 501 ms.  On exam, patient is alert, regular rate and rhythm, 2/6 systolic murmur, lungs CTAB, no LE edema or JVD.  For her chest pain/troponin elevation, suspect demand ischemia in setting of GI bleed.  Check echocardiogram to evaluate for new wall motion abnormality.  However I am concerned about underlying obstructive CAD.  Stress PET in December 2023 showed significantly decreased flow reserve with normal perfusion, could represent balanced ischemia versus microvascular disease.  Given cath in 2018 with 40% left main stenosis, concerned she could have progression of this.  Would consider cath once GI bleeding resolved.  Little Ishikawa, MD

## 2022-10-17 NOTE — Progress Notes (Signed)
[facility]                                                                               Dana [name], is a 82 y.o. female, DOB - 1940/10/05, ZOX:[id number] Admit date - 10/15/2022    Outpatient Primary MD for the patient is [name], MD  LOS - 1  days    Brief summary   Dana [name] is a 82 y.o. female with medical history significant of hypertension, hyperlipidemia, CAD s/p stent placement, GERD who presents to the emergency department due to 2-day onset of intermittent left parasternal chest pain.  Cardiologist at [facility] was consulted due to elevated troponin and it was recommended for patient to be admitted to [facility] for cardiology to be consulted after patient arrives to [facility] per [facility] EDP. Gastroenterologist was consulted and will follow-up with patient when she arrives to [facility] per [facility] EDP.  Assessment & Plan    Assessment and Plan:  Symptomatic anemia from GI bleed/ anemia of acute blood loss: Hemoglobin on admission around 8.3, dropped from 11.4 from 05/2022.  FOBT Is positive, with melanotic stools. Hemoglobin this morning stable around 9/  Continue with IV PPI BID.  GI consulted and plan for EGD today.   Chest Pain:  CAD, S/P PTCA, Elevated troponins sec to type 2 demand ischemia Troponin 318 > 315 Cardiologist at [facility] was already consulted by EDP, please reach out to cardiology team when patient arrives to [facility]  Hypokalemia  Replaced.    Hypertension:  Well controlled. Continue with verapamil 360 mg daily.    Hyperlipidemia:  Resume statin and zetia.   GERD: Stable.      Estimated body mass index is 28.33 kg/m as calculated from the following:   Height as of this encounter:  (1.575 m).   Weight as of this encounter: 70.3 kg.  Code Status: FULL CODE. DVT Prophylaxis:  SCDs Start: 10/16/22 0323   Level of Care: Level of care: Telemetry Medical Family Communication: NONE AT BEDSIDE.   Disposition Plan:     Remains inpatient  appropriate: pending   Procedures:  Echocardiogram.   Consultants:   CARDIOLOGY GASTROENTEROLOGY  Antimicrobials:   Anti-infectives (From admission, onward)    None        Medications  Scheduled Meds:  atorvastatin  10 mg Oral Daily   ezetimibe  10 mg Oral Daily   ferrous sulfate  325 mg Oral Q breakfast   pantoprazole (PROTONIX) IV  40 mg Intravenous Q12H   Continuous Infusions: PRN Meds:.acetaminophen **OR** acetaminophen, ondansetron **OR** ondansetron (ZOFRAN) IV    Subjective:   Dana [name] was seen and examined today.  No chest pain .   Objective:   Vitals:   10/16/22 1000 10/16/22 1037 10/16/22 2054 10/17/22 0512  BP: 111/68  110/83 116/69  Pulse: 78  87 78  Resp: (!) Temp:  98.1 F (36.7 C) 98 F (36.7 C) 98 F (36.7 C)  TempSrc:  Oral Oral Oral  SpO2: 100%  98% 98%  Weight:   70.3 kg   Height:        Intake/Output Summary (Last 24 hours) at 10/17/2022  0900 Last data filed at 10/17/2022 0403 Gross per 24 hour  Intake 0 ml  Output --  Net 0 ml   Filed Weights   10/15/22 2014 10/16/22 2054  Weight: 74 kg 70.3 kg     Exam General: Alert and oriented x 3, NAD Cardiovascular: S1 S2 auscultated, no murmurs, RRR Respiratory: Clear to auscultation bilaterally, no wheezing, rales or rhonchi Gastrointestinal: Soft, nontender, nondistended, + bowel sounds Ext: no pedal edema bilaterally Neuro: AAOx3, Cr N's II- XII. Strength 5/5 upper and lower extremities bilaterally Skin: No rashes Psych: Normal affect and demeanor, alert and oriented x3    Data Reviewed:  I have personally reviewed following labs and imaging studies   CBC Lab Results  Component Value Date   WBC 5.0 10/16/2022   RBC 2.96 (L) 10/16/2022   HGB 9.2 (L) 10/16/2022   HCT 28.5 (L) 10/16/2022   MCV 95.9 10/16/2022   MCH 31.4 10/16/2022   PLT 156 10/16/2022   MCHC 32.7 10/16/2022   RDW 17.0 (H) 10/16/2022   LYMPHSABS 0.7 06/23/2022   MONOABS 0.4 06/23/2022    EOSABS 0.1 06/23/2022   BASOSABS 0.0 06/23/2022     Last metabolic panel Lab Results  Component Value Date   NA 136 10/16/2022   K 3.2 (L) 10/16/2022   CL 104 10/16/2022   CO2 26 10/16/2022   BUN 12 10/16/2022   CREATININE 0.68 10/16/2022   GLUCOSE 95 10/16/2022   GFRNONAA >60 10/16/2022   GFRAA >60 08/07/2019   CALCIUM 7.8 (L) 10/16/2022   PHOS 3.4 10/16/2022   PROT 5.9 (L) 10/16/2022   ALBUMIN 2.3 (L) 10/16/2022   LABGLOB 2.9 06/23/2022   AGRATIO 1.2 06/23/2022   BILITOT 0.8 10/16/2022   ALKPHOS 83 10/16/2022   AST 21 10/16/2022   ALT 13 10/16/2022   ANIONGAP 6 10/16/2022    CBG (last 3)  No results for input(s): "GLUCAP" in the last 72 hours.    Coagulation Profile: No results for input(s): "INR", "PROTIME" in the last 168 hours.   Radiology Studies: DG Chest 2 View  Result Date: 10/15/2022 CLINICAL DATA:  Chest pain EXAM: CHEST - 2 VIEW COMPARISON:  X-ray 08/16/2022 and older. FINDINGS: Calcified and tortuous aorta. Normal cardiopericardial silhouette. No consolidation, pneumothorax or effusion. No edema. Overlapping cardiac leads. Air-fluid level along the stomach beneath the left hemidiaphragm. Degenerative changes of the spine on lateral view. IMPRESSION: No acute cardiopulmonary disease Electronically Signed   By: Karen Kays M.D.   On: 10/15/2022 20:41       Kathlen Mody M.D. [facility] 10/17/2022, 9:00 AM  Available via Epic secure chat 7am-7pm After 7 pm, please refer to night coverage provider listed on amion.

## 2022-10-18 ENCOUNTER — Inpatient Hospital Stay (HOSPITAL_COMMUNITY): Payer: Medicare Other

## 2022-10-18 DIAGNOSIS — I421 Obstructive hypertrophic cardiomyopathy: Secondary | ICD-10-CM

## 2022-10-18 DIAGNOSIS — R079 Chest pain, unspecified: Secondary | ICD-10-CM | POA: Diagnosis not present

## 2022-10-18 DIAGNOSIS — D649 Anemia, unspecified: Secondary | ICD-10-CM | POA: Diagnosis not present

## 2022-10-18 DIAGNOSIS — R7989 Other specified abnormal findings of blood chemistry: Secondary | ICD-10-CM | POA: Diagnosis not present

## 2022-10-18 DIAGNOSIS — K922 Gastrointestinal hemorrhage, unspecified: Secondary | ICD-10-CM | POA: Diagnosis not present

## 2022-10-18 LAB — CBC WITH DIFFERENTIAL/PLATELET
Abs Immature Granulocytes: 0.02 10*3/uL (ref 0.00–0.07)
Basophils Absolute: 0 10*3/uL (ref 0.0–0.1)
Basophils Relative: 0 %
Eosinophils Absolute: 0.2 10*3/uL (ref 0.0–0.5)
Eosinophils Relative: 3 %
HCT: 26.3 % — ABNORMAL LOW (ref 36.0–46.0)
Hemoglobin: 9 g/dL — ABNORMAL LOW (ref 12.0–15.0)
Immature Granulocytes: 0 %
Lymphocytes Relative: 22 %
Lymphs Abs: 1.3 10*3/uL (ref 0.7–4.0)
MCH: 32.3 pg (ref 26.0–34.0)
MCHC: 34.2 g/dL (ref 30.0–36.0)
MCV: 94.3 fL (ref 80.0–100.0)
Monocytes Absolute: 0.6 10*3/uL (ref 0.1–1.0)
Monocytes Relative: 11 %
Neutro Abs: 3.8 10*3/uL (ref 1.7–7.7)
Neutrophils Relative %: 64 %
Platelets: 185 10*3/uL (ref 150–400)
RBC: 2.79 MIL/uL — ABNORMAL LOW (ref 3.87–5.11)
RDW: 16.9 % — ABNORMAL HIGH (ref 11.5–15.5)
WBC: 5.9 10*3/uL (ref 4.0–10.5)
nRBC: 0 % (ref 0.0–0.2)

## 2022-10-18 LAB — BASIC METABOLIC PANEL
Anion gap: 6 (ref 5–15)
BUN: 7 mg/dL — ABNORMAL LOW (ref 8–23)
CO2: 23 mmol/L (ref 22–32)
Calcium: 7.8 mg/dL — ABNORMAL LOW (ref 8.9–10.3)
Chloride: 108 mmol/L (ref 98–111)
Creatinine, Ser: 0.7 mg/dL (ref 0.44–1.00)
GFR, Estimated: >60 mL/min (ref >60)
Glucose, Bld: 91 mg/dL (ref 70–99)
Potassium: 3.5 mmol/L (ref 3.5–5.1)
Sodium: 137 mmol/L (ref 135–145)

## 2022-10-18 LAB — ECHOCARDIOGRAM LIMITED
Height: 62 in
S' Lateral: 1.55 cm
Weight: 2478.4 oz

## 2022-10-18 MED ORDER — PEG-KCL-NACL-NASULF-NA ASC-C 100 G PO SOLR
0.5000 | Freq: Once | ORAL | Status: AC
  Administered 2022-10-18: 100 g via ORAL
  Filled 2022-10-18: qty 1

## 2022-10-18 MED ORDER — PEG-KCL-NACL-NASULF-NA ASC-C 100 G PO SOLR: 1.0000 | Freq: Once | ORAL | Status: DC

## 2022-10-18 MED ORDER — PEG-KCL-NACL-NASULF-NA ASC-C 100 G PO SOLR
0.5000 | Freq: Once | ORAL | Status: AC
  Administered 2022-10-18: 100 g via ORAL

## 2022-10-18 MED ORDER — PERFLUTREN LIPID MICROSPHERE
1.0000 mL | INTRAVENOUS | Status: AC | PRN
  Administered 2022-10-18: 2 mL via INTRAVENOUS

## 2022-10-18 MED ORDER — LACTATED RINGERS IV BOLUS
500.0000 mL | Freq: Once | INTRAVENOUS | Status: AC
  Administered 2022-10-18: 500 mL via INTRAVENOUS

## 2022-10-18 NOTE — Progress Notes (Signed)
[facility]                                                                               Dana [name], is a 82 y.o. female, DOB - 22-Oct-1940, ZOX:[id number] Admit date - 10/15/2022    Outpatient Primary MD for the patient is [name], MD  LOS - 2  days    Brief summary   Dana [name] is a 82 y.o. female with medical history significant of hypertension, hyperlipidemia, CAD s/p stent placement, GERD who presents to the emergency department due to 2-day onset of intermittent left parasternal chest pain.  Cardiologist at [facility] was consulted due to elevated troponin and it was recommended for patient to be admitted to [facility] for cardiology to be consulted after patient arrives to [facility] per [facility]P. Gastroenterologist was consulted and she is scheduled for EGD and colonoscopy in am.   Assessment & Plan    Assessment and Plan:  Symptomatic anemia from GI bleed/ anemia of acute blood loss: Hemoglobin on admission around 8.3, dropped from 11.4 from 05/2022.  FOBT Is positive, with melanotic stools. Hemoglobin this morning stable around 9/  Continue with IV PPI BID. Anemia panel will be sent in am.  GI consulted and plan for EGD and colonoscopy in am.   Chest Pain:  CAD, S/P PTCA, Elevated troponins sec to type 2 demand ischemia Troponin 318 > 315. 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  Currently denies any chest pain.  Cardiology on board.    Hypokalemia  Replaced. Repeat level wnl.    Hypertension:  BP parameters are borderline low. Hold the verapamil.   Hyperlipidemia:  Resume statin and zetia.   GERD: Stable. On PPI.      Estimated body mass index is 28.33 kg/m as calculated from the following:   Height as of this encounter:  (1.575 m).   Weight as of this encounter: 70.3 kg.  Code Status: FULL CODE. DVT Prophylaxis:  SCDs Start: 10/16/22 0323   Level of Care:  Level of care: Telemetry Medical Family Communication: NONE AT BEDSIDE.   Disposition Plan:     Remains inpatient appropriate: pending EGD AND COLONOSCOPY.   Procedures:  Echocardiogram. 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  No thrombus is seen. Left ventricular ejection  fraction, by estimation, is 70 to 75%. The left ventricle has hyperdynamic  function. There is moderate asymmetric left ventricular hypertrophy. Left  ventricular diastolic function could not be evaluated.  There is severe systolic anterior motion of the mitral leaflets. There is at least moderate, probably severe mitral insufficiency due to [facility].. Severe mitral valve regurgitation.  The right coronary cusp is severely calcified and immobile, but the  other two cusps move well. The aortic outflow tract gradient appears to be  largely due to dynamicsubvalvular obstruction due to [facility] and not due to  valvular disease. Peak gradient at  rest is approximately 65 mm Hg. The aortic valve is tricuspid. There is  moderate calcification of  the aortic valve. There is moderate thickening  of the aortic valve. Aortic valve regurgitation is trivial .  EGD and colonoscopy scheduled for 10/19/22/  Consultants:   CARDIOLOGY GASTROENTEROLOGY  Antimicrobials:   Anti-infectives (From admission, onward)    None        Medications  Scheduled Meds:  atorvastatin  10 mg Oral Daily   ezetimibe  10 mg Oral Daily   ferrous sulfate  325 mg Oral Q breakfast   pantoprazole (PROTONIX) IV  40 mg Intravenous Q12H   peg 3350 powder  0.5 kit Oral Once   And   peg 3350 powder  0.5 kit Oral Once   verapamil  360 mg Oral Daily   Continuous Infusions:  sodium chloride 20 mL/hr at 10/18/22 0413   lactated ringers     PRN Meds:.acetaminophen **OR** acetaminophen, ondansetron **OR**  ondansetron (ZOFRAN) IV    Subjective:   Dana [name] was seen and examined today.  Pt denies any chest pain or sob. No nausea or vomiting.   Objective:   Vitals:   10/18/22 0019 10/18/22 0408 10/18/22 1230 10/18/22 1245  BP: 123/63  (!) 80/41 (!) 90/49  Pulse: 72  78   Resp: Temp: 98.6 F (37 C) 97.7 F (36.5 C) 98.1 F (36.7 C)   TempSrc: Axillary Axillary Oral   SpO2: 97%     Weight:      Height:        Intake/Output Summary (Last 24 hours) at 10/18/2022 1319 Last data filed at 10/18/2022 0413 Gross per 24 hour  Intake 1.74 ml  Output --  Net 1.74 ml    Filed Weights   10/15/22 2014 10/16/22 2054  Weight: 74 kg 70.3 kg     Exam General exam: Appears calm and comfortable  Respiratory system: Clear to auscultation. Respiratory effort normal. Cardiovascular system: S1 & S2 heard, RRR. No JVD,  Gastrointestinal system: Abdomen is nondistended, soft and nontender.  Central nervous system: Alert and oriented. No focal neurological deficits. Extremities: Symmetric 5 x 5 power. Skin: No rashes,  Psychiatry:  Mood & affect appropriate.     Data Reviewed:  I have personally reviewed following labs and imaging studies   CBC Lab Results  Component Value Date   WBC 5.9 10/18/2022   RBC 2.79 (L) 10/18/2022   HGB 9.0 (L) 10/18/2022   HCT 26.3 (L) 10/18/2022   MCV 94.3 10/18/2022   MCH 32.3 10/18/2022   PLT 185 10/18/2022   MCHC 34.2 10/18/2022   RDW 16.9 (H) 10/18/2022   LYMPHSABS 1.3 10/18/2022   MONOABS 0.6 10/18/2022   EOSABS 0.2 10/18/2022   BASOSABS 0.0 10/18/2022     Last metabolic panel Lab Results  Component Value Date   NA 137 10/18/2022   K 3.5 10/18/2022   CL 108 10/18/2022   CO2 23 10/18/2022   BUN 7 (L) 10/18/2022   CREATININE 0.70 10/18/2022   GLUCOSE 91 10/18/2022   GFRNONAA >60 10/18/2022   GFRAA >60 08/07/2019   CALCIUM 7.8 (L) 10/18/2022   PHOS 3.4 10/16/2022   PROT 5.9 (L) 10/16/2022   ALBUMIN 2.3 (L) 10/16/2022    LABGLOB 2.9 06/23/2022   AGRATIO 1.2 06/23/2022   BILITOT 0.8 10/16/2022   ALKPHOS 83 10/16/2022   AST 21 10/16/2022   ALT 13 10/16/2022   ANIONGAP 6 10/18/2022    CBG (last 3)  No results for input(s): "GLUCAP" in the last 72 hours.    Coagulation Profile: No results  for input(s): "INR", "PROTIME" in the last 168 hours.   Radiology Studies: No results found.     Kathlen Mody M.D. [facility] 10/18/2022, 1:19 PM  Available via Epic secure chat 7am-7pm After 7 pm, please refer to night coverage provider listed on amion.

## 2022-10-18 NOTE — Progress Notes (Signed)
   Daily Progress Note  DOA: 10/15/2022 Hospital Day: 4 Chief Complaint: dark, heme positive stools and anemia   ASSESSMENT   82 yo female with a history of hypertrophic obstructive cardiomyopathy / CAD s/p remote stent placement admitted with chest pain / elevated troponin / acute on chronic anemia Cardiology following  - elevated troponin felt to be demand ischemia but known  heart disease and hypotension complicating picture. Plan is for cath after GI evaluation    Intermittent dark , Heme + stools ( on iron). Presenting Hgb 8.3 ( baseline 11.4). Rule out PUD, AVMs, gastrointestinal neoplasm  Per family she has been on oral iron at home so anemia history unknown though sounds like she has never had an endoscopic evaluation.  Hgb improved and stable a 9.0 post 1 u PRBCs.     Rheumatoid arthritis, on Methotrexate   GERD, on PPI and BID famotidine   PLAN   Was scheduled for EGD and colonoscopy today. Completed bowel prep but still having BMs.  Procedures have been rescheduled for tomorrow. Prepping again this evening. NPO after MN    Subjective / New events:   No complaints, resting. Son-in-law in room   Objective    Recent Labs    10/15/22 2100 10/16/22 0707 10/16/22 2331 10/18/22 0324  WBC 6.3 5.0  --  5.9  HGB 8.3* 9.3* 9.2* 9.0*  HCT 25.6* 28.4* 28.5* 26.3*  PLT 212 156  --  185   BMET Recent Labs    10/16/22 0707 10/17/22 1016 10/18/22 0324  NA 136 136 137  K 3.2* 3.9 3.5  CL 104 104 108  CO2 26 23 23  GLUCOSE 95 100* 91  BUN 12 8 7*  CREATININE 0.68 0.73 0.70  CALCIUM 7.8* 8.1* 7.8*   LFT Recent Labs    10/16/22 0707  PROT 5.9*  ALBUMIN 2.3*  AST 21  ALT 13  ALKPHOS 83  BILITOT 0.8   PT/INR No results for input(s): "LABPROT", "INR" in the last 72 hours.   Scheduled inpatient medications:   atorvastatin  10 mg Oral Daily   ezetimibe  10 mg Oral Daily   ferrous sulfate  325 mg Oral Q breakfast   pantoprazole (PROTONIX) IV  40 mg  Intravenous Q12H   peg 3350 powder  0.5 kit Oral Once   And   peg 3350 powder  0.5 kit Oral Once   verapamil  360 mg Oral Daily   Continuous inpatient infusions:   sodium chloride 20 mL/hr at 10/18/22 0413   lactated ringers     PRN inpatient medications: acetaminophen **OR** acetaminophen, ondansetron **OR** ondansetron (ZOFRAN) IV  Vital signs in last 24 hours: Temp:  [97.7 F (36.5 C)-98.9 F (37.2 C)] 98.1 F (36.7 C) (04/23 1230) Pulse Rate:  [72-78] 78 (04/23 1230) Resp:  [16-18] 17 (04/23 1230) BP: (80-123)/(41-77) 90/49 (04/23 1245) SpO2:  [97 %-100 %] 97 % (04/23 0019)    Intake/Output Summary (Last 24 hours) at 10/18/2022 1446 Last data filed at 10/18/2022 0413 Gross per 24 hour  Intake 1.74 ml  Output --  Net 1.74 ml    Intake/Output from previous day: 04/22 0701 - 04/23 0700 In: 1.7 [I.V.:1.7] Out: -  Intake/Output this shift: No intake/output data recorded.   Physical Exam:  General: female sleeping.  Heart:  Regular rate and rhythm.  Pulmonary: Normal respiratory effort   Principal Problem:   GI bleed Active Problems:   Essential hypertension   Mixed hyperlipidemia   Symptomatic   anemia   Troponin level elevated   GERD without esophagitis   Elevated brain natriuretic peptide (BNP) level   Hypokalemia     LOS: 2 days   Willette Cluster ,NP 10/18/2022, 2:46 PM

## 2022-10-18 NOTE — Anesthesia Preprocedure Evaluation (Addendum)
Anesthesia Evaluation  Patient identified by MRN, date of birth, ID band Patient awake    Reviewed: Allergy & Precautions, NPO status , Patient's Chart, lab work & pertinent test results, reviewed documented beta blocker date and time   Airway Mallampati: II  TM Distance: >3 FB Neck ROM: Full    Dental no notable dental hx. (+) Missing   Pulmonary neg pulmonary ROS   Pulmonary exam normal breath sounds clear to auscultation       Cardiovascular Exercise Tolerance: Good hypertension, Pt. on medications + angina with exertion + CAD, + Cardiac Stents and +CHF  Normal cardiovascular examI Rhythm:Regular Rate:Normal  ECHO 23 1. LVOT is narrow (14 mm) Turbulent flow through the LVOT With valsalva  gradient was 49 mm Hg (3.5 m/sec) Compared to echo from April 2023  gradient is improved (80 to 49 mm Hg) . Left ventricular ejection  fraction, by estimation, is 70 to 75%. The left  ventricle has hyperdynamic function. The left ventricle has no regional  wall motion abnormalities. There is severe asymmetric left ventricular  hypertrophy.   2. Right ventricular systolic function is normal. The right ventricular  size is normal. There is normal pulmonary artery systolic pressure.   3. Left atrial size was mildly dilated.   4. Systolic anterior motion of the anterior and posterior mitral  leaflets.. Mild mitral valve regurgitation.   5. Poor acoustic windows limiit study AV is thickened, calcified with  mildly restricted motion. Difficult to assess for valve gradients with  concomitant LVOT obstruction. The aortic valve is abnormal. Aortic valve  regurgitation is not visualized.   6. The inferior vena cava is normal in size with greater than 50%  respiratory variability, suggesting right atrial pressure of 3 mmHg.     Neuro/Psych negative neurological ROS  negative psych ROS   GI/Hepatic Neg liver ROS,GERD  Medicated and Controlled,,   Endo/Other  negative endocrine ROS    Renal/GU negative Renal ROSLab Results      Component                Value               Date                      CREATININE               0.70                10/18/2022                BUN                      7 (L)               10/18/2022                NA                       137                 10/18/2022                K                        3.5                 10/18/2022  negative genitourinary   Musculoskeletal  (+) Arthritis , Osteoarthritis,    Abdominal   Peds negative pediatric ROS (+)  Hematology  (+) Blood dyscrasia, anemia Lab Results      Component                Value               Date                      WBC                      5.9                 10/18/2022                HGB                      9.0 (L)             10/18/2022                HCT                      26.3 (L)            10/18/2022                MCV                      94.3                10/18/2022                PLT                      185                 10/18/2022              Anesthesia Other Findings   Reproductive/Obstetrics negative OB ROS                             Anesthesia Physical Anesthesia Plan  ASA: 4  Anesthesia Plan: MAC   Post-op Pain Management: Minimal or no pain anticipated   Induction: Intravenous  PONV Risk Score and Plan: 2 and Propofol infusion and Treatment may vary due to age or medical condition  Airway Management Planned: Natural Airway, Simple Face Mask and Mask  Additional Equipment: None  Intra-op Plan:   Post-operative Plan:   Informed Consent: I have reviewed the patients History and Physical, chart, labs and discussed the procedure including the risks, benefits and alternatives for the proposed anesthesia with the patient or authorized representative who has indicated his/her understanding and acceptance.     Dental advisory given and Interpreter used  for interveiw  Plan Discussed with: CRNA and Anesthesiologist  Anesthesia Plan Comments: (Pt speaks samoan )        Anesthesia Quick Evaluation

## 2022-10-18 NOTE — H&P (View-Only) (Signed)
Daily Progress Note  DOA: 10/15/2022 Hospital Day: 4 Chief Complaint: dark, heme positive stools and anemia   ASSESSMENT   82 yo female with a history of hypertrophic obstructive cardiomyopathy / CAD s/p remote stent placement admitted with chest pain / elevated troponin / acute on chronic anemia Cardiology following  - elevated troponin felt to be demand ischemia but known  heart disease and hypotension complicating picture. Plan is for cath after GI evaluation    Intermittent dark , Heme + stools ( on iron). Presenting Hgb 8.3 ( baseline 11.4). Rule out PUD, AVMs, gastrointestinal neoplasm  Per family she has been on oral iron at home so anemia history unknown though sounds like she has never had an endoscopic evaluation.  Hgb improved and stable a 9.0 post 1 u PRBCs.     Rheumatoid arthritis, on Methotrexate   GERD, on PPI and BID famotidine   PLAN   Was scheduled for EGD and colonoscopy today. Completed bowel prep but still having BMs.  Procedures have been rescheduled for tomorrow. Prepping again this evening. NPO after MN    Subjective / New events:   No complaints, resting. Son-in-law in room   Objective    Recent Labs    10/15/22 2100 10/16/22 0707 10/16/22 2331 10/18/22 0324  WBC 6.3 5.0  --  5.9  HGB 8.3* 9.3* 9.2* 9.0*  HCT 25.6* 28.4* 28.5* 26.3*  PLT 212 156  --  185   BMET Recent Labs    10/16/22 0707 10/17/22 1016 10/18/22 0324  NA 136 136 137  K 3.2* 3.9 3.5  CL 104 104 108  CO2 GLUCOSE 95 100* 91  BUN 12 8 7*  CREATININE 0.68 0.73 0.70  CALCIUM 7.8* 8.1* 7.8*   LFT Recent Labs    10/16/22 0707  PROT 5.9*  ALBUMIN 2.3*  AST 21  ALT 13  ALKPHOS 83  BILITOT 0.8   PT/INR No results for input(s): "LABPROT", "INR" in the last 72 hours.   Scheduled inpatient medications:   atorvastatin  10 mg Oral Daily   ezetimibe  10 mg Oral Daily   ferrous sulfate  325 mg Oral Q breakfast   pantoprazole (PROTONIX) IV  40 mg  Intravenous Q12H   peg 3350 powder  0.5 kit Oral Once   And   peg 3350 powder  0.5 kit Oral Once   verapamil  360 mg Oral Daily   Continuous inpatient infusions:   sodium chloride 20 mL/hr at 10/18/22 0413   lactated ringers     PRN inpatient medications: acetaminophen **OR** acetaminophen, ondansetron **OR** ondansetron (ZOFRAN) IV  Vital signs in last 24 hours: Temp:  [97.7 F (36.5 C)-98.9 F (37.2 C)] 98.1 F (36.7 C) (04/23 1230) Pulse Rate:  [72-78] 78 (04/23 1230) Resp:  [16-18] 17 (04/23 1230) BP: (80-123)/(41-77) 90/49 (04/23 1245) SpO2:  [97 %-100 %] 97 % (04/23 0019)    Intake/Output Summary (Last 24 hours) at 10/18/2022 1446 Last data filed at 10/18/2022 0413 Gross per 24 hour  Intake 1.74 ml  Output --  Net 1.74 ml    Intake/Output from previous day: 04/22 0701 - 04/23 0700 In: 1.7 [I.V.:1.7] Out: -  Intake/Output this shift: No intake/output data recorded.   Physical Exam:  General: female sleeping.  Heart:  Regular rate and rhythm.  Pulmonary: Normal respiratory effort   Principal Problem:   GI bleed Active Problems:   Essential hypertension   Mixed hyperlipidemia   Symptomatic  anemia   Troponin level elevated   GERD without esophagitis   Elevated brain natriuretic peptide (BNP) level   Hypokalemia     LOS: 2 days   Willette Cluster ,NP 10/18/2022, 2:46 PM

## 2022-10-18 NOTE — Progress Notes (Addendum)
Rounding Note    Patient Name: Dana [name] Date of Encounter: 10/18/2022  [facility] Cardiologist: Nona Dell, MD   Subjective   Pt family is bedside. Pt denies further chest pain  Inpatient Medications    Scheduled Meds:  atorvastatin  10 mg Oral Daily   ezetimibe  10 mg Oral Daily   ferrous sulfate  325 mg Oral Q breakfast   pantoprazole (PROTONIX) IV  40 mg Intravenous Q12H   verapamil  360 mg Oral Daily   Continuous Infusions:  sodium chloride 20 mL/hr at 10/18/22 0413   PRN Meds: acetaminophen **OR** acetaminophen, ondansetron **OR** ondansetron (ZOFRAN) IV   Vital Signs    Vitals:   10/17/22 1153 10/17/22 1959 10/18/22 0019 10/18/22 0408  BP: 137/67 120/77 123/63   Pulse: 84 73 72   Resp: 17 17 18 16   Temp: 98.2 F (36.8 C) 98.9 F (37.2 C) 98.6 F (37 C) 97.7 F (36.5 C)  TempSrc: Oral Oral Axillary Axillary  SpO2:  100% 97%   Weight:      Height:        Intake/Output Summary (Last 24 hours) at 10/18/2022 0725 Last data filed at 10/18/2022 0413 Gross per 24 hour  Intake 1.74 ml  Output --  Net 1.74 ml      10/16/2022    8:54 PM 10/15/2022    8:14 PM 09/06/2022   11:03 AM  Last 3 Weights  Weight (lbs) 154 lb 14.4 oz 163 lb 2.3 oz 158 lb 12.8 oz  Weight (kg) 70.262 kg 74 kg 72.031 kg      Telemetry    Sinus rhythm with HR 70s - Personally Reviewed  ECG    No new tracings - Personally Reviewed  Physical Exam   GEN: No acute distress.   Neck: mild JVD Cardiac: RRR, 4/6 systolic murmur Respiratory: Clear to auscultation bilaterally. GI: Soft, nontender, non-distended  MS: No edema; No deformity. Neuro:  Nonfocal  Psych: Normal affect   Labs    High Sensitivity Troponin:   Recent Labs  Lab 10/15/22 2100 10/15/22 2301 10/16/22 0707 10/16/22 1458  TROPONINIHS 318* 315* 356* 846*     Chemistry Recent Labs  Lab 10/16/22 0707 10/17/22 1016 10/18/22 0324  NA 136 136 137  K 3.2* 3.9 3.5  CL 104 104 108  CO2  26 23 23   GLUCOSE 95 100* 91  BUN 12 8 7*  CREATININE 0.68 0.73 0.70  CALCIUM 7.8* 8.1* 7.8*  MG 1.8  --   --   PROT 5.9*  --   --   ALBUMIN 2.3*  --   --   AST 21  --   --   ALT 13  --   --   ALKPHOS 83  --   --   BILITOT 0.8  --   --   GFRNONAA >60 >60 >60  ANIONGAP 6 9 6     Lipids No results for input(s): "CHOL", "TRIG", "HDL", "LABVLDL", "LDLCALC", "CHOLHDL" in the last 168 hours.  Hematology Recent Labs  Lab 10/15/22 2100 10/16/22 0707 10/16/22 2331 10/18/22 0324  WBC 6.3 5.0  --  5.9  RBC 2.59* 2.96*  --  2.79*  HGB 8.3* 9.3* 9.2* 9.0*  HCT 25.6* 28.4* 28.5* 26.3*  MCV 98.8 95.9  --  94.3  MCH 32.0 31.4  --  32.3  MCHC 32.4 32.7  --  34.2  RDW 15.3 17.0*  --  16.9*  PLT 212 156  --  185  Thyroid No results for input(s): "TSH", "FREET4" in the last 168 hours.  BNP Recent Labs  Lab 10/15/22 2100  BNP 474.0*    DDimer No results for input(s): "DDIMER" in the last 168 hours.   Radiology    No results found.  Cardiac Studies   Echo pending  Echo 08/2022:  1. LVOT is narrow (14 mm) Turbulent flow through the LVOT With valsalva  gradient was 49 mm Hg (3.5 m/sec) Compared to echo from April 2023  gradient is improved (80 to 49 mm Hg) . Left ventricular ejection  fraction, by estimation, is 70 to 75%. The left  ventricle has hyperdynamic function. The left ventricle has no regional  wall motion abnormalities. There is severe asymmetric left ventricular  hypertrophy.   2. Right ventricular systolic function is normal. The right ventricular  size is normal. There is normal pulmonary artery systolic pressure.   3. Left atrial size was mildly dilated.   4. Systolic anterior motion of the anterior and posterior mitral  leaflets.. Mild mitral valve regurgitation.   5. Poor acoustic windows limiit study AV is thickened, calcified with  mildly restricted motion. Difficult to assess for valve gradients with  concomitant LVOT obstruction. The aortic valve is  abnormal. Aortic valve  regurgitation is not visualized.   6. The inferior vena cava is normal in size with greater than 50%  respiratory variability, suggesting right atrial pressure of 3 mmHg.    PET CT 05/10/22:   Findings are consistent with no prior ischemia and no prior myocardial infarction. The study is intermediate risk due to the presence of decreased global myocardial blood flow reserve which may indicate the presence of microvascular disease, however, cannot rule out underlying obstructive CAD given the presence of extensive coronary calcification. Could consider cath for further evaluation if clinically indicated.   LV perfusion is normal.   Rest left ventricular function is normal. Rest EF: 55 %. Stress left ventricular function is normal. Stress EF: 60 %. End diastolic cavity size is normal. End systolic cavity size is normal.   Myocardial blood flow was computed to be 0.27ml/g/min at rest and 1.48ml/g/min at stress. Global myocardial blood flow reserve was 1.29 and was abnormal.   Coronary calcium was present on the attenuation correction CT images. Severe coronary calcifications were present. Coronary calcifications were present in the left anterior descending artery, left circumflex artery and right coronary artery distribution(s).  Patient Profile     82 y.o. female with a hx of CAD s/p PCI OM1 and HOCM, with preserved LVEF and LVOT gradient of 100 mmHg now on AV nodal therapy who was admitted 10/15/22 with chest pain and is being seen 10/17/2022 for the evaluation of elevated troponins with angina at the request of Dr. Blake Divine.  Daughter interpreted for [place].   Assessment & Plan    Chest pain Elevated troponin  HS troponin 318-- > 315 --> 356 --> 846 In the setting of GI bleeding with heme positive stools requiring transfusion - known HOCM and hypotension this admission complicating picture ?demand ischemia - will consider definitive angiography once no longer bleeding and she  is stable, if cath and possible PCI, will need to make sure she is cleared for DAPT - continue statin, no heparin given GI bleed - no longer complaining of chest pain   Hypertension - initially hypotensive - has been restarted on verapamil   CAD Prior PCI with stent to OM1 Residual disease with 40% in left main and 50% LAD No  ischemia on PET CT 04/2022, but may represent multivessel disease vs microvascular disease   HOCM - septal variant LVOT gradient 100, improved to 49 mmHg on echo 08/2022 Will avoid hypotension and dehydration, agree with giving IV fluids while she is undergoing rprep for colonoscopy - echo pending   GI bleed Planning for endoscopy       For questions or updates, please contact [facility] Please consult [email or web address] for contact info under        Signed, Marcelino Duster, PA  10/18/2022, 7:25 AM    Patient seen and examined.  Agree with above documentation.  On exam, patient is alert and oriented, regular rate and rhythm, 2 out of 6 systolic murmur, lungs CTAB, no LE edema or JVD.  Planning EGD/colonoscopy for evaluation of GI bleeding.  Repeat echocardiogram pending to evaluate LVOT gradient.  Little Ishikawa, MD

## 2022-10-19 ENCOUNTER — Telehealth: Payer: Self-pay | Admitting: Internal Medicine

## 2022-10-19 ENCOUNTER — Inpatient Hospital Stay (HOSPITAL_COMMUNITY): Payer: Medicare Other | Admitting: Anesthesiology

## 2022-10-19 ENCOUNTER — Encounter (HOSPITAL_COMMUNITY): Payer: Self-pay | Admitting: Internal Medicine

## 2022-10-19 ENCOUNTER — Encounter (HOSPITAL_COMMUNITY)
Admission: EM | Disposition: A | Discharge status: Home / Self Care | Payer: Self-pay | Source: Home / Self Care | Attending: Family Medicine

## 2022-10-19 DIAGNOSIS — R58 Hemorrhage, not elsewhere classified: Secondary | ICD-10-CM

## 2022-10-19 DIAGNOSIS — T182XXA Foreign body in stomach, initial encounter: Secondary | ICD-10-CM

## 2022-10-19 DIAGNOSIS — R195 Other fecal abnormalities: Secondary | ICD-10-CM | POA: Diagnosis not present

## 2022-10-19 DIAGNOSIS — K921 Melena: Secondary | ICD-10-CM

## 2022-10-19 DIAGNOSIS — R7989 Other specified abnormal findings of blood chemistry: Secondary | ICD-10-CM | POA: Diagnosis not present

## 2022-10-19 DIAGNOSIS — I110 Hypertensive heart disease with heart failure: Secondary | ICD-10-CM | POA: Diagnosis not present

## 2022-10-19 DIAGNOSIS — I25119 Atherosclerotic heart disease of native coronary artery with unspecified angina pectoris: Secondary | ICD-10-CM | POA: Diagnosis not present

## 2022-10-19 DIAGNOSIS — I509 Heart failure, unspecified: Secondary | ICD-10-CM

## 2022-10-19 DIAGNOSIS — I421 Obstructive hypertrophic cardiomyopathy: Secondary | ICD-10-CM | POA: Diagnosis not present

## 2022-10-19 DIAGNOSIS — Z538 Procedure and treatment not carried out for other reasons: Secondary | ICD-10-CM

## 2022-10-19 DIAGNOSIS — K922 Gastrointestinal hemorrhage, unspecified: Secondary | ICD-10-CM | POA: Diagnosis not present

## 2022-10-19 LAB — BASIC METABOLIC PANEL
Anion gap: 11 (ref 5–15)
BUN: 7 mg/dL — ABNORMAL LOW (ref 8–23)
CO2: 19 mmol/L — ABNORMAL LOW (ref 22–32)
Calcium: 7.4 mg/dL — ABNORMAL LOW (ref 8.9–10.3)
Chloride: 109 mmol/L (ref 98–111)
Creatinine, Ser: 0.63 mg/dL (ref 0.44–1.00)
GFR, Estimated: >60 mL/min (ref >60)
Glucose, Bld: 92 mg/dL (ref 70–99)
Potassium: 3.3 mmol/L — ABNORMAL LOW (ref 3.5–5.1)
Sodium: 139 mmol/L (ref 135–145)

## 2022-10-19 SURGERY — CANCELLED PROCEDURE
Anesthesia: Monitor Anesthesia Care

## 2022-10-19 MED ORDER — POTASSIUM CHLORIDE 20 MEQ PO PACK
40.0000 meq | PACK | Freq: Once | ORAL | Status: AC
  Administered 2022-10-19: 40 meq via ORAL
  Filled 2022-10-19: qty 2

## 2022-10-19 MED ORDER — PROPOFOL 500 MG/50ML IV EMUL
INTRAVENOUS | Status: DC | PRN
  Administered 2022-10-19: 100 ug/kg/min via INTRAVENOUS

## 2022-10-19 MED ORDER — PEG-KCL-NACL-NASULF-NA ASC-C 100 G PO SOLR
0.5000 | Freq: Once | ORAL | Status: AC
  Administered 2022-10-19: 100 g via ORAL

## 2022-10-19 MED ORDER — LACTATED RINGERS IV SOLN: Freq: Once | INTRAVENOUS | Status: AC

## 2022-10-19 MED ORDER — OYSTER SHELL CALCIUM/D3 500-5 MG-MCG PO TABS
1.0000 | ORAL_TABLET | Freq: Every day | ORAL | Status: AC
  Administered 2022-10-19 – 2022-10-21 (×3): 1 via ORAL
  Filled 2022-10-19 (×3): qty 1

## 2022-10-19 MED ORDER — METOCLOPRAMIDE HCL 5 MG/ML IJ SOLN
10.0000 mg | Freq: Once | INTRAMUSCULAR | Status: AC
  Administered 2022-10-19: 10 mg via INTRAVENOUS
  Filled 2022-10-19 (×2): qty 2

## 2022-10-19 MED ORDER — PEG-KCL-NACL-NASULF-NA ASC-C 100 G PO SOLR: 1.0000 | Freq: Once | ORAL | Status: DC

## 2022-10-19 MED ORDER — POTASSIUM CHLORIDE 10 MEQ/100ML IV SOLN
10.0000 meq | INTRAVENOUS | Status: AC
  Administered 2022-10-19 (×3): 10 meq via INTRAVENOUS
  Filled 2022-10-19 (×3): qty 100

## 2022-10-19 MED ORDER — PROPOFOL 10 MG/ML IV BOLUS
INTRAVENOUS | Status: DC | PRN
  Administered 2022-10-19: 20 mg via INTRAVENOUS

## 2022-10-19 MED ORDER — LACTATED RINGERS IV SOLN: INTRAVENOUS | Status: DC | PRN

## 2022-10-19 MED ORDER — LINACLOTIDE 145 MCG PO CAPS
290.0000 ug | ORAL_CAPSULE | Freq: Every day | ORAL | Status: DC
  Administered 2022-10-19 – 2022-10-21 (×3): 290 ug via ORAL
  Filled 2022-10-19 (×4): qty 2

## 2022-10-19 MED ORDER — PEG-KCL-NACL-NASULF-NA ASC-C 100 G PO SOLR
0.5000 | Freq: Once | ORAL | Status: AC
  Administered 2022-10-19: 100 g via ORAL
  Filled 2022-10-19: qty 1

## 2022-10-19 MED ORDER — LINACLOTIDE 290 MCG PO CAPS: 290.0000 ug | ORAL_CAPSULE | Freq: Every day | ORAL | Status: DC

## 2022-10-19 SURGICAL SUPPLY — 25 items

## 2022-10-19 NOTE — Anesthesia Postprocedure Evaluation (Signed)
Anesthesia Post Note  Patient: Dana [name]  Procedure(s) Performed: CANCELLED PROCEDURE/colonoscopy ABORTED EGD     Patient location during evaluation: PACU Anesthesia Type: MAC Level of consciousness: awake and alert Pain management: pain level controlled Vital Signs Assessment: post-procedure vital signs reviewed and stable Respiratory status: spontaneous breathing, nonlabored ventilation, respiratory function stable and patient connected to nasal cannula oxygen Cardiovascular status: stable and blood pressure returned to baseline Postop Assessment: no apparent nausea or vomiting Anesthetic complications: no   No notable events documented.  Last Vitals:  Vitals:   10/19/22 1021 10/19/22 1027  BP: (!) 90/55 (!) 102/57  Pulse: 76 78  Resp: (!) 21 14  Temp:    SpO2: 96% 96%    Last Pain:  Vitals:   10/19/22 1027  TempSrc:   PainSc: 0-No pain                 Deone Omahoney

## 2022-10-19 NOTE — Interval H&P Note (Signed)
History and Physical Interval Note:  10/19/2022 9:32 AM  Dana [name]  has presented today for surgery, with the diagnosis of anemia and hemoccult positive stool.  The various methods of treatment have been discussed with the patient and family. After consideration of risks, benefits and other options for treatment, the patient has consented to  Procedure(s): ESOPHAGOGASTRODUODENOSCOPY (EGD) WITH PROPOFOL (N/A) COLONOSCOPY WITH PROPOFOL (N/A) as a surgical intervention.  The patient's history has been reviewed, patient examined, no change in status, stable for surgery.  I have reviewed the patient's chart and labs.  Questions were answered to the patient's satisfaction.     Tressia Danas

## 2022-10-19 NOTE — Telephone Encounter (Signed)
Dr. Izora Ribas would prefer appointments on same day.

## 2022-10-19 NOTE — Telephone Encounter (Signed)
Patient's son wants to know if we can keep it the way it is. He cannot do Mondays, Wednesdays or Fridays and it has to be before lunch on Tuesdays and Thursdays only. The next available we would have for that criteria would not be until September. Please advise.

## 2022-10-19 NOTE — Progress Notes (Signed)
Pt's K+ was 3.3 this a.m, Margo Aye, MD notified. Awaiting new orders, see MAR.  Bari Edward, RN

## 2022-10-19 NOTE — Transfer of Care (Signed)
Immediate Anesthesia Transfer of Care Note  Patient: Dana [name]  Procedure(s) Performed: ESOPHAGOGASTRODUODENOSCOPY (EGD) WITH PROPOFOL CANCELLED PROCEDURE  Patient Location: Endoscopy Unit  Anesthesia Type:MAC  Level of Consciousness: drowsy  Airway & Oxygen Therapy: Patient Spontanous Breathing  Post-op Assessment: Report given to RN and Post -op Vital signs reviewed and stable  Post vital signs: Reviewed and stable  Last Vitals:  Vitals Value Taken Time  BP 85/56   Temp    Pulse 74   Resp 14   SpO2 96     Last Pain:  Vitals:   10/19/22 0927  TempSrc: Temporal  PainSc: 0-No pain      Patients Stated Pain Goal: 0 (10/19/22 0815)  Complications: No notable events documented.

## 2022-10-19 NOTE — Care Management Important Message (Signed)
Important Message  Patient Details  Name: Dana [name] MRN: [id number] Date of Birth: 07/17/1940   Medicare Important Message Given:  Yes     Renie Ora 10/19/2022, 7:57 AM

## 2022-10-19 NOTE — Progress Notes (Addendum)
Rounding Note    Patient Name: Dana [name] Date of Encounter: 10/19/2022  [facility] Cardiologist: Nona Dell, MD   Subjective   No family at bedside, she speak some english. She denies pain, does not appear volume up.  Inpatient Medications    Scheduled Meds:  atorvastatin  10 mg Oral Daily   calcium-vitamin D  1 tablet Oral Q breakfast   ezetimibe  10 mg Oral Daily   ferrous sulfate  325 mg Oral Q breakfast   pantoprazole (PROTONIX) IV  40 mg Intravenous Q12H   potassium chloride  40 mEq Oral Once   Continuous Infusions:  sodium chloride 20 mL/hr at 10/18/22 0413   PRN Meds: acetaminophen **OR** acetaminophen, ondansetron **OR** ondansetron (ZOFRAN) IV   Vital Signs    Vitals:   10/18/22 2026 10/18/22 2055 10/19/22 0415 10/19/22 0751  BP: 139/67 101/60 125/60 124/70  Pulse: 71 71 79 82  Resp: Temp: 98.1 F (36.7 C) 99 F (37.2 C) 98.6 F (37 C) 98.9 F (37.2 C)  TempSrc: Oral Oral Oral Oral  SpO2: 95% 94%  97%  Weight:      Height:        Intake/Output Summary (Last 24 hours) at 10/19/2022 0839 Last data filed at 10/18/2022 2200 Gross per 24 hour  Intake 4309.14 ml  Output --  Net 4309.14 ml      10/16/2022    8:54 PM 10/15/2022    8:14 PM 09/06/2022   11:03 AM  Last 3 Weights  Weight (lbs) 154 lb 14.4 oz 163 lb 2.3 oz 158 lb 12.8 oz  Weight (kg) 70.262 kg 74 kg 72.031 kg      Telemetry    Sinus rhythm in the 80s - Personally Reviewed  ECG    No new tracings - Personally Reviewed  Physical Exam   GEN: No acute distress.   Neck: No JVD Cardiac: RRR,3/6 systolic murmur Respiratory: Clear to auscultation bilaterally. GI: Soft, nontender, non-distended  MS: No edema; No deformity. Neuro:  Nonfocal  Psych: Normal affect   Labs    High Sensitivity Troponin:   Recent Labs  Lab 10/15/22 2100 10/15/22 2301 10/16/22 0707 10/16/22 1458  TROPONINIHS 318* 315* 356* 846*     Chemistry Recent Labs  Lab  10/16/22 0707 10/17/22 1016 10/18/22 0324 10/19/22 0204  NA 136 136 137 139  K 3.2* 3.9 3.5 3.3*  CL 104 104 108 109  CO2 19*  GLUCOSE 95 100* 91 92  BUN 12 8 7* 7*  CREATININE 0.68 0.73 0.70 0.63  CALCIUM 7.8* 8.1* 7.8* 7.4*  MG 1.8  --   --   --   PROT 5.9*  --   --   --   ALBUMIN 2.3*  --   --   --   AST 21  --   --   --   ALT 13  --   --   --   ALKPHOS 83  --   --   --   BILITOT 0.8  --   --   --   GFRNONAA >60 >60 >60 >60  ANIONGAP Lipids No results for input(s): "CHOL", "TRIG", "HDL", "LABVLDL", "LDLCALC", "CHOLHDL" in the last 168 hours.  Hematology Recent Labs  Lab 10/15/22 2100 10/16/22 0707 10/16/22 2331 10/18/22 0324  WBC 6.3 5.0  --  5.9  RBC 2.59* 2.96*  --  2.79*  HGB 8.3*  9.3* 9.2* 9.0*  HCT 25.6* 28.4* 28.5* 26.3*  MCV 98.8 95.9  --  94.3  MCH 32.0 31.4  --  32.3  MCHC 32.4 32.7  --  34.2  RDW 15.3 17.0*  --  16.9*  PLT 212 156  --  185   Thyroid No results for input(s): "TSH", "FREET4" in the last 168 hours.  BNP Recent Labs  Lab 10/15/22 2100  BNP 474.0*    DDimer No results for input(s): "DDIMER" in the last 168 hours.   Radiology    ECHOCARDIOGRAM LIMITED  Result Date: 10/18/2022    ECHOCARDIOGRAM LIMITED REPORT   Patient Name:   VLADA URIOSTEGUI Date of Exam: 10/18/2022 Medical Rec #:  [id number]    Height:       62.0 in Accession #:    [id number]   Weight:       154.9 lb Date of Birth:  25-Dec-1940    BSA:          1.715 m Patient Age:    82 years     BP:           123/63 mmHg Patient Gender: F            HR:           83 bpm. Exam Location:  Inpatient Procedure: Limited Echo, Color Doppler, Cardiac Doppler and Intracardiac            Opacification Agent Indications:    Chest Pain  History:        Patient has prior history of Echocardiogram examinations, most                 recent 08/30/2022. Hypertrophic Cardiomyopathy, CAD, Mitral Valve                 Disease, Signs/Symptoms:Chest Pain; Risk Factors:Hypertension                  and Dyslipidemia. Elevated BNP and Troponin.  Sonographer:    Milbert Coulter Referring Phys: [id number] LAURA R INGOLD IMPRESSIONS  1. There is asymmetric septal hypertrophy with typical HOCM pathophysiology, but there is also apical entrapment due to mid cavity obliteration. 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 No thrombus is seen. Left ventricular ejection fraction, by estimation, is 70 to 75%. The left ventricle has hyperdynamic function. There is moderate asymmetric left ventricular hypertrophy. Left ventricular diastolic function could not be evaluated.  2. Right ventricular systolic function is normal. The right ventricular size is normal.  3. Left atrial size was moderately dilated.  4. There is severe systolic anterior motion of the mitral leaflets. There is at least moderate, probably severe mitral insufficiency due to [facility] Severe mitral valve regurgitation.  5. The right coronary cusp is severely calcified and immobile, but the other two cusps move well. The aortic outflow tract gradient appears to be largely due to dynamicsubvalvular obstruction due to [facility] and not due to valvular disease. Peak gradient at rest is approximately 65 mm Hg. The aortic valve is tricuspid. There is moderate calcification of the aortic valve. There is moderate thickening of the aortic valve. Aortic valve regurgitation is trivial. 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 FINDINGS  Left Ventricle: There is asymmetric septal hypertrophy with typical HOCM pathophysiology, but there is also apical entrapment due to mid cavity obliteration. 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 No thrombus is seen. Left ventricular ejection fraction, by estimation, is 70  to 75%. The left ventricle has hyperdynamic function. Definity contrast agent was given IV to delineate the left ventricular endocardial  borders. There is moderate asymmetric left ventricular hypertrophy. Left ventricular diastolic function could not be evaluated. Right Ventricle: The right ventricular size is normal. No increase in right ventricular wall thickness. Right ventricular systolic function is normal. Left Atrium: Left atrial size was moderately dilated. Right Atrium: Right atrial size was normal in size. Pericardium: There is no evidence of pericardial effusion. Mitral Valve: There is severe systolic anterior motion of the mitral leaflets. There is at least moderate, probably severe mitral insufficiency due to [facility]. Mild mitral annular calcification. Severe mitral valve regurgitation, with posteriorly-directed jet. Tricuspid Valve: The tricuspid valve is not well visualized. Tricuspid valve regurgitation is mild. Aortic Valve: The right coronary cusp is severely calcified and immobile, but the other two cusps move well. The aortic outflow tract gradient appears to be largely due to dynamicsubvalvular obstruction due to [facility] and not due to valvular disease. Peak gradient at rest is approximately 65 mm Hg. The aortic valve is tricuspid. There is moderate calcification of the aortic valve. There is moderate thickening of the aortic valve. Aortic valve regurgitation is trivial. Pulmonic Valve: The pulmonic valve was not well visualized. Aorta: The aortic root is normal in size and structure. IAS/Shunts: The interatrial septum was not assessed. Additional Comments: Spectral Doppler performed. Color Doppler performed.  LEFT VENTRICLE PLAX 2D LVIDd:         4.00 cm LVIDs:         1.55 cm LV PW:         1.40 cm LV IVS:        1.80 cm LVOT diam:     1.90 cm LVOT Area:     2.84 cm  LEFT ATRIUM         Index LA diam:    3.30 cm 1.92 cm/m  TRICUSPID VALVE TR Peak grad:   31.3 mmHg TR Vmax:        279.84 cm/s  SHUNTS Systemic Diam: 1.90 cm Thurmon Fair MD Electronically signed by Thurmon Fair MD Signature Date/Time: 10/18/2022/2:13:06 PM    Final      Cardiac Studies   Echo limited 10/18/22: 1. 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  No thrombus is seen. Left ventricular ejection  fraction, by estimation, is 70 to 75%. The left ventricle has hyperdynamic  function. There is moderate asymmetric left ventricular hypertrophy. Left  ventricular diastolic function could not be evaluated.   2. Right ventricular systolic function is normal. The right ventricular  size is normal.   3. Left atrial size was moderately dilated.   4. There is severe systolic anterior motion of the mitral leaflets. There  is at least moderate, probably severe mitral insufficiency due to [facility]  Severe mitral valve regurgitation.   5. The right coronary cusp is severely calcified and immobile, but the  other two cusps move well. The aortic outflow tract gradient appears to be  largely due to dynamicsubvalvular obstruction due to [facility] and not due to  valvular disease. Peak gradient at  rest is approximately 65 mm Hg. The aortic valve is tricuspid. There is  moderate calcification of the aortic valve. There is moderate thickening  of the aortic valve. Aortic valve regurgitation is trivial.    Echo 08/2022:  1. LVOT  is narrow (14 mm) Turbulent flow through the LVOT With valsalva  gradient was 49 mm Hg (3.5 m/sec) Compared to echo from April 2023  gradient is improved (80 to 49 mm Hg) . Left ventricular ejection  fraction, by estimation, is 70 to 75%. The left  ventricle has hyperdynamic function. The left ventricle has no regional  wall motion abnormalities. There is severe asymmetric left ventricular  hypertrophy.   2. Right ventricular systolic function is normal. The right ventricular  size is normal. There is normal pulmonary artery systolic pressure.   3. Left atrial size was mildly dilated.    4. Systolic anterior motion of the anterior and posterior mitral  leaflets.. Mild mitral valve regurgitation.   5. Poor acoustic windows limiit study AV is thickened, calcified with  mildly restricted motion. Difficult to assess for valve gradients with  concomitant LVOT obstruction. The aortic valve is abnormal. Aortic valve  regurgitation is not visualized.   6. The inferior vena cava is normal in size with greater than 50%  respiratory variability, suggesting right atrial pressure of 3 mmHg.      PET CT 05/10/22:   Findings are consistent with no prior ischemia and no prior myocardial infarction. The study is intermediate risk due to the presence of decreased global myocardial blood flow reserve which may indicate the presence of microvascular disease, however, cannot rule out underlying obstructive CAD given the presence of extensive coronary calcification. Could consider cath for further evaluation if clinically indicated.   LV perfusion is normal.   Rest left ventricular function is normal. Rest EF: 55 %. Stress left ventricular function is normal. Stress EF: 60 %. End diastolic cavity size is normal. End systolic cavity size is normal.   Myocardial blood flow was computed to be 0.25ml/g/min at rest and 1.55ml/g/min at stress. Global myocardial blood flow reserve was 1.29 and was abnormal.   Coronary calcium was present on the attenuation correction CT images. Severe coronary calcifications were present. Coronary calcifications were present in the left anterior descending artery, left circumflex artery and right coronary artery distribution(s).  Patient Profile     82 y.o. female with a hx of CAD s/p PCI OM1 and HOCM, with preserved LVEF and LVOT gradient of 100 mmHg now on AV nodal therapy who was admitted 10/15/22 with chest pain and is being seen 10/17/2022 for the evaluation of elevated troponins with angina at the request of Dr. Blake Divine.  Daughter interpreted for [place].   Assessment & Plan     Chest pain Elevated troponin  HS troponin trended from 318 --> 846 With HOCM and hypotension this admission likely contributing to a demand ischemia picture - she continues to deny chest pain   CAD Pripr PCI with stent to OM1 Residual disease with 40% left main and 50% LAD PET/CT did not rule out balanced ischemia/multivessel disease vs microvascular disease   HOCM with septal variant, ?developing aneurysm Repeat limited echo yesterday with asymmetric septal hypertrophy with typical HOCM findings but also apical entrapment due to mid cavity obliteration - apical dyskinesis/developing aneurysm but no thrombus seen - hyperdynamic LVEF 70-75%, moderate LAE, severe SAM with severe MR - right coronary cusp immobile, but aortic outflow gradient due to [facility] - will be sensitive to dehydration, will need to stay hydrated   GI bleed Unfortunately, she was not adequately prepped for endoscopy and this was rescheduled for today.   - Hb stable 9.0      For questions or updates, please contact [facility] Please consult [email or web address] for contact info under        Signed, Marcelino Duster, PA  10/19/2022, 8:39 AM    Patient seen and examined.  Agree with above documentation.  On exam, patient is alert, regular rate and rhythm, 2/6 systolic murmur, lungs CTAB, no LE edema.  EGD was attempted today but canceled due to food in stomach.  EGD/colonoscopy will be reattempted tomorrow.  Echo yesterday shows LVOT gradient 65 mmHg, developing apical aneurysm, likely severe mitral regurgitation due to [facility].  Will need close follow-up in HCM clinic on discharge.   Little Ishikawa, MD

## 2022-10-19 NOTE — Progress Notes (Signed)
PROGRESS NOTE    Dana [name]  ZOX:[id number] DOB: 07/31/1940 DOA: 10/15/2022  PCP: [name], MD   Brief Narrative:  This 82 y.o. female with medical history significant of hypertension, hyperlipidemia, CAD s/p stent placement, GERD who presents to the emergency department due to 2-day onset of intermittent left parasternal chest pain.  Cardiologist at [facility] was consulted due to elevated troponin and it was recommended for patient to be admitted to [facility] for cardiology to be consulted after patient arrives to [facility] per [facility] EDP. Gastroenterologist was consulted and she is scheduled for EGD and colonoscopy in am.  EGD and colonoscopy was canceled due to poor preparation.  Assessment & Plan:   Principal Problem:   GI bleed Active Problems:   Essential hypertension   Mixed hyperlipidemia   Symptomatic anemia   Troponin level elevated   GERD without esophagitis   Elevated brain natriuretic peptide (BNP) level   Hypokalemia   Blood in the stool  Symptomatic anemia: GI bleed: Acute blood loss anemia: Hemoglobin on this admission around 8.3, dropped from 11.4 from 05/2022.  FOBT Is positive, with melanotic stools. Hemoglobin this morning stable around 9.0 Continue with IV PPI BID. Anemia panel pending GI consulted and plan for EGD and colonoscopy. EGD and colonoscopy is canceled today due to poor preparation. H&H remains stable.  No obvious visible bleeding.   Chest Pain:  CAD, S/P PTCA, Elevated troponins sec to type 2 demand ischemia Troponin 318 > 315. 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  Currently Patient denies any chest pain.  Cardiology on board. Needs close follow up in HCM clinic on discharge.    Hypokalemia  Replaced.  Continue to monitor.   Hypertension:  BP parameters are borderline low. Hold the verapamil.   Hyperlipidemia:  Resume statin and zetia.    GERD: Stable. On PPI.       Estimated body mass index is 28.33 kg/m as calculated from the following:   Height as of this encounter: 5\' 2"  (1.575 m).   Weight as of this encounter: 70.3 kg.   DVT prophylaxis: SCDs Code Status: Full code Family Communication: Son at bed side Disposition Plan:   Status is: Inpatient Remains inpatient appropriate because: Pending EGD and colonoscopy.   Consultants:  Cardiology Gastroenterology  Procedures: Echocardiogram Antimicrobials: None  Subjective: Patient was seen and examined at bedside.  Overnight events noted. EGD and colonoscopy is canceled today due to poor preparation/ food in the stomach.  Objective: Vitals:   10/19/22 0927 10/19/22 1011 10/19/22 1021 10/19/22 1027  BP: (!) 118/59 (!) 98/50 (!) 90/55 (!) 102/57  Pulse: 82 73 76 78  Resp: (!) 30 (!) 29 (!) 21 14  Temp: 97.9 F (36.6 C) 97.9 F (36.6 C)    TempSrc: Temporal Temporal    SpO2: 96% 96% 96% 96%  Weight:      Height:        Intake/Output Summary (Last 24 hours) at 10/19/2022 1406 Last data filed at 10/19/2022 1004 Gross per 24 hour  Intake 4409.14 ml  Output --  Net 4409.14 ml   Filed Weights   10/15/22 2014 10/16/22 2054  Weight: 74 kg 70.3 kg    Examination:  General exam: Appears calm and comfortable, not in any acute distress. Respiratory system: Clear to auscultation. Respiratory effort normal.  RR 16 Cardiovascular system: S1 & S2 heard, rate and rhythm, no murmur. Gastrointestinal system: Abdomen is soft, non tender, non distended, BS+  Central nervous system: Alert and oriented x 3. No focal neurological deficits. Extremities: Symmetric 5 x 5 power. Skin: No rashes, lesions or ulcers Psychiatry: Judgement and insight appear normal. Mood & affect appropriate.     Data Reviewed: I have personally reviewed following labs and imaging studies  CBC: Recent Labs  Lab 10/15/22 2100 10/16/22 0707 10/16/22 2331 10/18/22 0324  WBC 6.3 5.0  --  5.9  NEUTROABS  --   --    --  3.8  HGB 8.3* 9.3* 9.2* 9.0*  HCT 25.6* 28.4* 28.5* 26.3*  MCV 98.8 95.9  --  94.3  PLT 212 156  --  185   Basic Metabolic Panel: Recent Labs  Lab 10/15/22 2100 10/16/22 0707 10/17/22 1016 10/18/22 0324 10/19/22 0204  NA 135 136 136 137 139  K 3.3* 3.2* 3.9 3.5 3.3*  CL 102 104 104 108 109  CO2 19*  GLUCOSE 120* 95 100* 91 92  BUN 7* 7*  CREATININE 0.71 0.68 0.73 0.70 0.63  CALCIUM 8.1* 7.8* 8.1* 7.8* 7.4*  MG  --  1.8  --   --   --   PHOS  --  3.4  --   --   --    GFR: Estimated Creatinine Clearance: 51.5 mL/min (by C-G formula based on SCr of 0.63 mg/dL). Liver Function Tests: Recent Labs  Lab 10/16/22 0707  AST 21  ALT 13  ALKPHOS 83  BILITOT 0.8  PROT 5.9*  ALBUMIN 2.3*   No results for input(s): "LIPASE", "AMYLASE" in the last 168 hours. No results for input(s): "AMMONIA" in the last 168 hours. Coagulation Profile: No results for input(s): "INR", "PROTIME" in the last 168 hours. Cardiac Enzymes: No results for input(s): "CKTOTAL", "CKMB", "CKMBINDEX", "TROPONINI" in the last 168 hours. BNP (last 3 results) No results for input(s): "PROBNP" in the last 8760 hours. HbA1C: No results for input(s): "HGBA1C" in the last 72 hours. CBG: No results for input(s): "GLUCAP" in the last 168 hours. Lipid Profile: No results for input(s): "CHOL", "HDL", "LDLCALC", "TRIG", "CHOLHDL", "LDLDIRECT" in the last 72 hours. Thyroid Function Tests: No results for input(s): "TSH", "T4TOTAL", "FREET4", "T3FREE", "THYROIDAB" in the last 72 hours. Anemia Panel: No results for input(s): "VITAMINB12", "FOLATE", "FERRITIN", "TIBC", "IRON", "RETICCTPCT" in the last 72 hours. Sepsis Labs: Recent Labs  Lab 10/15/22 2100 10/15/22 2301  LATICACIDVEN 1.6 1.5    No results found for this or any previous visit (from the past 240 hour(s)).   Radiology Studies: ECHOCARDIOGRAM LIMITED  Result Date: 10/18/2022    ECHOCARDIOGRAM LIMITED REPORT   Patient Name:    Dana [name] Date of Exam: 10/18/2022 Medical Rec #:  [id number]    Height:       62.0 in Accession #:    [id number]   Weight:       154.9 lb Date of Birth:  [birth year]    BSA:          1.715 m Patient Age:    81 years     BP:           123/63 mmHg Patient Gender: F            HR:           83 bpm. Exam Location:  Inpatient Procedure: Limited Echo, Color Doppler, Cardiac Doppler and Intracardiac            Opacification Agent Indications:    Chest Pain  History:  Patient has prior history of Echocardiogram examinations, most                 recent 08/30/2022. Hypertrophic Cardiomyopathy, CAD, Mitral Valve                 Disease, Signs/Symptoms:Chest Pain; Risk Factors:Hypertension                 and Dyslipidemia. Elevated BNP and Troponin.  Sonographer:    Milbert Coulter Referring Phys: [id number] LAURA R INGOLD IMPRESSIONS  1. There is asymmetric septal hypertrophy with typical HOCM pathophysiology, but there is also apical entrapment due to mid cavity obliteration. 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 No thrombus is seen. Left ventricular ejection fraction, by estimation, is 70 to 75%. The left ventricle has hyperdynamic function. There is moderate asymmetric left ventricular hypertrophy. Left ventricular diastolic function could not be evaluated.  2. Right ventricular systolic function is normal. The right ventricular size is normal.  3. Left atrial size was moderately dilated.  4. There is severe systolic anterior motion of the mitral leaflets. There is at least moderate, probably severe mitral insufficiency due to [facility].. Severe mitral valve regurgitation.  5. The right coronary cusp is severely calcified and immobile, but the other two cusps move well. The aortic outflow tract gradient appears to be largely due to dynamicsubvalvular obstruction due to [facility] and not due to valvular disease. Peak gradient at rest is approximately 65 mm Hg. The aortic valve is tricuspid. There is moderate  calcification of the aortic valve. There is moderate thickening of the aortic valve. Aortic valve regurgitation is trivial. 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 FINDINGS  Left Ventricle: There is asymmetric septal hypertrophy with typical HOCM pathophysiology, but there is also apical entrapment due to mid cavity obliteration. 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 No thrombus is seen. Left ventricular ejection fraction, by estimation, is 70 to 75%. The left ventricle has hyperdynamic function. Definity contrast agent was given IV to delineate the left ventricular endocardial borders. There is moderate asymmetric left ventricular hypertrophy. Left ventricular diastolic function could not be evaluated. Right Ventricle: The right ventricular size is normal. No increase in right ventricular wall thickness. Right ventricular systolic function is normal. Left Atrium: Left atrial size was moderately dilated. Right Atrium: Right atrial size was normal in size. Pericardium: There is no evidence of pericardial effusion. Mitral Valve: There is severe systolic anterior motion of the mitral leaflets. There is at least moderate, probably severe mitral insufficiency due to [facility]. Mild mitral annular calcification. Severe mitral valve regurgitation, with posteriorly-directed jet. Tricuspid Valve: The tricuspid valve is not well visualized. Tricuspid valve regurgitation is mild. Aortic Valve: The right coronary cusp is severely calcified and immobile, but the other two cusps move well. The aortic outflow tract gradient appears to be largely due to dynamicsubvalvular obstruction due to [facility] and not due to valvular disease. Peak gradient at rest is approximately 65 mm Hg. The aortic valve is tricuspid. There is moderate calcification of the aortic valve. There is moderate thickening of the aortic valve.  Aortic valve regurgitation is trivial. Pulmonic Valve: The pulmonic valve was not well visualized. Aorta: The aortic root is normal in size and structure. IAS/Shunts: The interatrial septum was not assessed. Additional Comments: Spectral Doppler performed. Color Doppler performed.  LEFT VENTRICLE PLAX 2D LVIDd:         4.00 cm LVIDs:  1.55 cm LV PW:         1.40 cm LV IVS:        1.80 cm LVOT diam:     1.90 cm LVOT Area:     2.84 cm  LEFT ATRIUM         Index LA diam:    3.30 cm 1.92 cm/m  TRICUSPID VALVE TR Peak grad:   31.3 mmHg TR Vmax:        279.84 cm/s  SHUNTS Systemic Diam: 1.90 cm Mihai Croitoru MD Electronically signed by Thurmon Fair MD Signature Date/Time: 10/18/2022/2:13:06 PM    Final     Scheduled Meds:  atorvastatin  10 mg Oral Daily   calcium-vitamin D  1 tablet Oral Q breakfast   ezetimibe  10 mg Oral Daily   ferrous sulfate  325 mg Oral Q breakfast   linaclotide  290 mcg Oral QAC breakfast   pantoprazole (PROTONIX) IV  40 mg Intravenous Q12H   peg 3350 powder  0.5 kit Oral Once   And   peg 3350 powder  0.5 kit Oral Once   Continuous Infusions:   LOS: 3 days    Time spent: 50 mins    Willeen Niece, MD [facility] HospitalistsNeeds close follow-up If 7PM-7AM, please contact night-coverage  in HCM clinic on discharge.

## 2022-10-19 NOTE — Telephone Encounter (Signed)
Patient had two appointments on 06/11 with Dr. Izora Ribas and Dr. Jomarie Longs. The appointment with Dr. Jomarie Longs had to be rescheduled due to the provider going out of town. This was not on a combined HOCM clinic day so not sure if the patient is OK to have her appt with Dr. Ronnie Doss on 06/11 then see Dr. Jomarie Longs on 06/25 or if the appointments need to be back to back. Please advise.

## 2022-10-19 NOTE — Op Note (Addendum)
[facility] Patient Name: Dana [name] Procedure Date : 10/19/2022 MRN: [id number] Attending MD: Tressia Danas MD, MD, [id number] Date of Birth: [birth year] CSN: [id number] Age: 82 Admit Type: Inpatient Procedure:                Upper GI endoscopy Indications:              Heme positive stool, Suspected upper                            gastrointestinal bleeding in patient with chronic                            blood loss Providers:                Tressia Danas MD, MD, Pollie Friar RN, RN,                            Priscella Mann, Technician Referring MD:             Jeani Hawking, MD Medicines:                Monitored Anesthesia Care Complications:            No immediate complications. Estimated Blood Loss:     Estimated blood loss: none. Procedure:                Pre-Anesthesia Assessment:                           - Prior to the procedure, a History and Physical                            was performed, and patient medications and                            allergies were reviewed. The patient's tolerance of                            previous anesthesia was also reviewed. The risks                            and benefits of the procedure and the sedation                            options and risks were discussed with the patient.                            All questions were answered, and informed consent                            was obtained. Prior Anticoagulants: The patient has                            taken no anticoagulant or antiplatelet agents. ASA  Grade Assessment: IV - A patient with severe                            systemic disease that is a constant threat to life.                            After reviewing the risks and benefits, the patient                            was deemed in satisfactory condition to undergo the                            procedure.                           After obtaining informed consent,  the endoscope was                            passed under direct vision. Throughout the                            procedure, the patient's blood pressure, pulse, and                            oxygen saturations were monitored continuously. The                            [id number]-[id number] ([id number]) Olympus endoscope was introduced                            through the mouth, with the intention of advancing                            to the duodenum. The scope was advanced to the                            gastric body before the procedure was aborted due                            to the large amount of retained food and risk for                            aspiration. Medications were given. The upper GI                            endoscopy was extremely difficult due to presence                            of food. The patient tolerated the procedure well. Scope In: Scope Out: Findings:      Limited views of the esophagus were normal.      A large amount of food (residue) was found in the entire examined       stomach. Impression:               -  Normal esophagus.                           - The procedure was aborted due to a large amount                            of food (residue) in the stomach. Recommendation:           - Return patient to hospital ward for ongoing care.                           - Clear liquid diet. No solid foods today. NPO at                            midnight.                           - Continue present medications. Will repeat bowel                            prep tonight and supplement with both Reglan and                            Linzess to facilitate having these procedures                            performed tomorrow.                           - Repeat upper endoscopy tomorrow because the                            preparation was poor. Will plan for concurrent                            colonoscopy at that time.                           - The findings and  recommendations were discussed                            with the patient.                           - The findings and recommendations were discussed                            with the patient's family by phone. There was some                            confusion over the diet yesterday. The son will                            update other family members. Procedure Code(s):        --- Professional ---  43235, 53, Esophagogastroduodenoscopy, flexible,                            transoral; diagnostic, including collection of                            specimen(s) by brushing or washing, when performed                            (separate procedure) Diagnosis Code(s):        --- Professional ---                           R19.5, Other fecal abnormalities                           R58, Hemorrhage, not elsewhere classified CPT copyright 2022 American Medical Association. All rights reserved. The codes documented in this report are preliminary and upon coder review may  be revised to meet current compliance requirements. Tressia Danas MD, MD 10/19/2022 10:21:57 AM This report has been signed electronically. Number of Addenda: 0

## 2022-10-20 ENCOUNTER — Encounter (HOSPITAL_COMMUNITY)
Admission: EM | Disposition: A | Discharge status: Home / Self Care | Payer: Self-pay | Source: Home / Self Care | Attending: Family Medicine

## 2022-10-20 ENCOUNTER — Inpatient Hospital Stay (HOSPITAL_COMMUNITY): Payer: Medicare Other | Admitting: Anesthesiology

## 2022-10-20 ENCOUNTER — Encounter (HOSPITAL_COMMUNITY): Payer: Self-pay | Admitting: Internal Medicine

## 2022-10-20 DIAGNOSIS — K635 Polyp of colon: Secondary | ICD-10-CM | POA: Diagnosis not present

## 2022-10-20 DIAGNOSIS — K5730 Diverticulosis of large intestine without perforation or abscess without bleeding: Secondary | ICD-10-CM | POA: Diagnosis not present

## 2022-10-20 DIAGNOSIS — I421 Obstructive hypertrophic cardiomyopathy: Secondary | ICD-10-CM | POA: Diagnosis not present

## 2022-10-20 DIAGNOSIS — D127 Benign neoplasm of rectosigmoid junction: Secondary | ICD-10-CM | POA: Diagnosis not present

## 2022-10-20 DIAGNOSIS — D500 Iron deficiency anemia secondary to blood loss (chronic): Secondary | ICD-10-CM | POA: Diagnosis not present

## 2022-10-20 DIAGNOSIS — K2970 Gastritis, unspecified, without bleeding: Secondary | ICD-10-CM

## 2022-10-20 DIAGNOSIS — I10 Essential (primary) hypertension: Secondary | ICD-10-CM

## 2022-10-20 DIAGNOSIS — I2510 Atherosclerotic heart disease of native coronary artery without angina pectoris: Secondary | ICD-10-CM | POA: Diagnosis not present

## 2022-10-20 DIAGNOSIS — D125 Benign neoplasm of sigmoid colon: Secondary | ICD-10-CM

## 2022-10-20 DIAGNOSIS — K3189 Other diseases of stomach and duodenum: Secondary | ICD-10-CM | POA: Diagnosis not present

## 2022-10-20 DIAGNOSIS — K2990 Gastroduodenitis, unspecified, without bleeding: Secondary | ICD-10-CM

## 2022-10-20 DIAGNOSIS — K922 Gastrointestinal hemorrhage, unspecified: Secondary | ICD-10-CM | POA: Diagnosis not present

## 2022-10-20 DIAGNOSIS — D3A8 Other benign neuroendocrine tumors: Secondary | ICD-10-CM

## 2022-10-20 DIAGNOSIS — Q394 Esophageal web: Secondary | ICD-10-CM

## 2022-10-20 DIAGNOSIS — K648 Other hemorrhoids: Secondary | ICD-10-CM

## 2022-10-20 DIAGNOSIS — R7989 Other specified abnormal findings of blood chemistry: Secondary | ICD-10-CM | POA: Diagnosis not present

## 2022-10-20 DIAGNOSIS — K639 Disease of intestine, unspecified: Secondary | ICD-10-CM

## 2022-10-20 HISTORY — PX: BIOPSY: SHX5522

## 2022-10-20 HISTORY — PX: POLYPECTOMY: SHX5525

## 2022-10-20 HISTORY — PX: COLONOSCOPY WITH PROPOFOL: SHX5780

## 2022-10-20 HISTORY — PX: ESOPHAGOGASTRODUODENOSCOPY (EGD) WITH PROPOFOL: SHX5813

## 2022-10-20 LAB — CBC
HCT: 28.3 % — ABNORMAL LOW (ref 36.0–46.0)
Hemoglobin: 9.3 g/dL — ABNORMAL LOW (ref 12.0–15.0)
MCH: 32 pg (ref 26.0–34.0)
MCHC: 32.9 g/dL (ref 30.0–36.0)
MCV: 97.3 fL (ref 80.0–100.0)
Platelets: 242 10*3/uL (ref 150–400)
RBC: 2.91 MIL/uL — ABNORMAL LOW (ref 3.87–5.11)
RDW: 16.8 % — ABNORMAL HIGH (ref 11.5–15.5)
WBC: 5.2 10*3/uL (ref 4.0–10.5)
nRBC: 0 % (ref 0.0–0.2)

## 2022-10-20 LAB — BASIC METABOLIC PANEL
Anion gap: 6 (ref 5–15)
BUN: 5 mg/dL — ABNORMAL LOW (ref 8–23)
CO2: 20 mmol/L — ABNORMAL LOW (ref 22–32)
Calcium: 7.8 mg/dL — ABNORMAL LOW (ref 8.9–10.3)
Chloride: 112 mmol/L — ABNORMAL HIGH (ref 98–111)
Creatinine, Ser: 0.55 mg/dL (ref 0.44–1.00)
GFR, Estimated: >60 mL/min (ref >60)
Glucose, Bld: 84 mg/dL (ref 70–99)
Potassium: 3.7 mmol/L (ref 3.5–5.1)
Sodium: 138 mmol/L (ref 135–145)

## 2022-10-20 LAB — MAGNESIUM: Magnesium: 1.8 mg/dL (ref 1.7–2.4)

## 2022-10-20 LAB — PHOSPHORUS: Phosphorus: 3.5 mg/dL (ref 2.5–4.6)

## 2022-10-20 SURGERY — COLONOSCOPY WITH PROPOFOL
Anesthesia: Monitor Anesthesia Care

## 2022-10-20 MED ORDER — PROPOFOL 10 MG/ML IV BOLUS
INTRAVENOUS | Status: DC | PRN
  Administered 2022-10-20 (×2): 10 mg via INTRAVENOUS

## 2022-10-20 MED ORDER — SODIUM CHLORIDE 0.9 % IV SOLN: INTRAVENOUS | Status: DC | PRN

## 2022-10-20 MED ORDER — PROPOFOL 500 MG/50ML IV EMUL
INTRAVENOUS | Status: DC | PRN
  Administered 2022-10-20: 125 ug/kg/min via INTRAVENOUS

## 2022-10-20 MED ORDER — PHENYLEPHRINE 80 MCG/ML (10ML) SYRINGE FOR IV PUSH (FOR BLOOD PRESSURE SUPPORT)
PREFILLED_SYRINGE | INTRAVENOUS | Status: DC | PRN
  Administered 2022-10-20 (×2): 80 ug via INTRAVENOUS
  Administered 2022-10-20 (×4): 120 ug via INTRAVENOUS

## 2022-10-20 SURGICAL SUPPLY — 25 items

## 2022-10-20 NOTE — Plan of Care (Signed)

## 2022-10-20 NOTE — Anesthesia Preprocedure Evaluation (Addendum)
Anesthesia Evaluation  Patient identified by MRN, date of birth, ID band Patient awake    Reviewed: Allergy & Precautions, NPO status , Patient's Chart, lab work & pertinent test results  History of Anesthesia Complications Negative for: history of anesthetic complications  Airway Mallampati: II  TM Distance: >3 FB Neck ROM: Full    Dental  (+) Poor Dentition, Missing,    Pulmonary neg pulmonary ROS   Pulmonary exam normal        Cardiovascular hypertension, Pt. on medications + CAD and + Cardiac Stents (2018)  Normal cardiovascular exam  TTE 10/18/22: asymmetric septal hypertrophy with typical HOCM pathophysiology, but there is also apical entrapment due to mid cavity obliteration, small area of apical dyskinesis/developing aneurysm, EF 70-75%, moderate LAE, severe systolic anterior motion of the mitral leaflets, at least moderate probably severe mitral insufficiency due to [facility], aortic peak gradient at rest is approximately 65 mmHg     Neuro/Psych negative neurological ROS     GI/Hepatic Neg liver ROS,GERD  Medicated,,  Endo/Other  negative endocrine ROS    Renal/GU negative Renal ROS     Musculoskeletal  (+) Arthritis , Rheumatoid disorders,    Abdominal   Peds  Hematology  (+) Blood dyscrasia (Hgb 9.3), anemia   Anesthesia Other Findings Day of surgery medications reviewed with patient.  Reproductive/Obstetrics                              Anesthesia Physical Anesthesia Plan  ASA: 4  Anesthesia Plan: MAC   Post-op Pain Management: Minimal or no pain anticipated   Induction:   PONV Risk Score and Plan: 2 and Treatment may vary due to age or medical condition and Propofol infusion  Airway Management Planned: Natural Airway and Nasal Cannula  Additional Equipment: None  Intra-op Plan:   Post-operative Plan:   Informed Consent: I have reviewed the patients History and  Physical, chart, labs and discussed the procedure including the risks, benefits and alternatives for the proposed anesthesia with the patient or authorized representative who has indicated his/her understanding and acceptance.     Interpreter used for [employer] Discussed with: CRNA  Anesthesia Plan Comments:          Anesthesia Quick Evaluation

## 2022-10-20 NOTE — Progress Notes (Addendum)
Progress Note  Patient Name: Dana [name] Date of Encounter: 10/20/2022  Primary Cardiologist: Nona Dell, MD  Subjective   Denies any chest pain or dyspnea  Inpatient Medications    Scheduled Meds:  atorvastatin  10 mg Oral Daily   calcium-vitamin D  1 tablet Oral Q breakfast   ezetimibe  10 mg Oral Daily   ferrous sulfate  325 mg Oral Q breakfast   linaclotide  290 mcg Oral QAC breakfast   pantoprazole (PROTONIX) IV  40 mg Intravenous Q12H   Continuous Infusions:  PRN Meds: acetaminophen **OR** acetaminophen, ondansetron **OR** ondansetron (ZOFRAN) IV   Vital Signs    Vitals:   10/19/22 1543 10/19/22 2044 10/20/22 0455 10/20/22 0816  BP: (!) 108/59 134/64 (!) 140/73 111/60  Pulse:  76 69 70  Resp: Temp: 98.2 F (36.8 C) 98.3 F (36.8 C) 98.1 F (36.7 C) 97.9 F (36.6 C)  TempSrc: Axillary Oral  Oral  SpO2:  95% 98% 97%  Weight:      Height:        Intake/Output Summary (Last 24 hours) at 10/20/2022 0920 Last data filed at 10/19/2022 1500 Gross per 24 hour  Intake 820 ml  Output --  Net 820 ml      10/16/2022    8:54 PM 10/15/2022    8:14 PM 09/06/2022   11:03 AM  Last 3 Weights  Weight (lbs) 154 lb 14.4 oz 163 lb 2.3 oz 158 lb 12.8 oz  Weight (kg) 70.262 kg 74 kg 72.031 kg     Telemetry    NSR, rare PAC - Personally Reviewed  Physical Exam  GEN:  in no acute distress HEENT: normal Neck: no JVD Cardiac: RRR; 2/6 systolic murmur Respiratory:  clear to auscultation bilaterally, normal work of breathing GI: soft, nontender MS: no deformity or atrophy Skin: warm and dry, no rash Neuro:  Alert and Oriented Psych: normal affect    Labs    High Sensitivity Troponin:   Recent Labs  Lab 10/15/22 2100 10/15/22 2301 10/16/22 0707 10/16/22 1458  TROPONINIHS 318* 315* 356* 846*      Cardiac EnzymesNo results for input(s): "TROPONINI" in the last 168 hours. No results for input(s): "TROPIPOC" in the last 168 hours.    Chemistry Recent Labs  Lab 10/16/22 0707 10/17/22 1016 10/18/22 0324 10/19/22 0204 10/20/22 0302  NA 136   < > 137 139 138  K 3.2*   < > 3.5 3.3* 3.7  CL 104   < > 108 109 112*  CO2 26   < > 23 19* 20*  GLUCOSE 95   < > 91 92 84  BUN 12   < > 7* 7* 5*  CREATININE 0.68   < > 0.70 0.63 0.55  CALCIUM 7.8*   < > 7.8* 7.4* 7.8*  PROT 5.9*  --   --   --   --   ALBUMIN 2.3*  --   --   --   --   AST 21  --   --   --   --   ALT 13  --   --   --   --   ALKPHOS 83  --   --   --   --   BILITOT 0.8  --   --   --   --   GFRNONAA >60   < > >60 >60 >60  ANIONGAP 6   < > < > =  values in this interval not displayed.     Hematology Recent Labs  Lab 10/16/22 0707 10/16/22 2331 10/18/22 0324 10/20/22 0302  WBC 5.0  --  5.9 5.2  RBC 2.96*  --  2.79* 2.91*  HGB 9.3* 9.2* 9.0* 9.3*  HCT 28.4* 28.5* 26.3* 28.3*  MCV 95.9  --  94.3 97.3  MCH 31.4  --  32.3 32.0  MCHC 32.7  --  34.2 32.9  RDW 17.0*  --  16.9* 16.8*  PLT 156  --  185 242    BNP Recent Labs  Lab 10/15/22 2100  BNP 474.0*     DDimer No results for input(s): "DDIMER" in the last 168 hours.   Radiology    ECHOCARDIOGRAM LIMITED  Result Date: 10/18/2022    ECHOCARDIOGRAM LIMITED REPORT   Patient Name:   Dana [name] Date of Exam: 10/18/2022 Medical Rec #:  [id number]    Height:       62.0 in Accession #:    [id number]   Weight:       154.9 lb Date of Birth:  04-May-1941    BSA:          1.715 m Patient Age:    81 years     BP:           123/63 mmHg Patient Gender: F            HR:           83 bpm. Exam Location:  Inpatient Procedure: Limited Echo, Color Doppler, Cardiac Doppler and Intracardiac            Opacification Agent Indications:    Chest Pain  History:        Patient has prior history of Echocardiogram examinations, most                 recent 08/30/2022. Hypertrophic Cardiomyopathy, CAD, Mitral Valve                 Disease, Signs/Symptoms:Chest Pain; Risk Factors:Hypertension                 and  Dyslipidemia. Elevated BNP and Troponin.  Sonographer:    Milbert Coulter Referring Phys: [id number] LAURA R INGOLD IMPRESSIONS  1. There is asymmetric septal hypertrophy with typical HOCM pathophysiology, but there is also apical entrapment due to mid cavity obliteration. 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 No thrombus is seen. Left ventricular ejection fraction, by estimation, is 70 to 75%. The left ventricle has hyperdynamic function. There is moderate asymmetric left ventricular hypertrophy. Left ventricular diastolic function could not be evaluated.  2. Right ventricular systolic function is normal. The right ventricular size is normal.  3. Left atrial size was moderately dilated.  4. There is severe systolic anterior motion of the mitral leaflets. There is at least moderate, probably severe mitral insufficiency due to [facility].. Severe mitral valve regurgitation.  5. The right coronary cusp is severely calcified and immobile, but the other two cusps move well. The aortic outflow tract gradient appears to be largely due to dynamicsubvalvular obstruction due to [facility] and not due to valvular disease. Peak gradient at rest is approximately 65 mm Hg. The aortic valve is tricuspid. There is moderate calcification of the aortic valve. There is moderate thickening of the aortic valve. Aortic valve regurgitation is trivial. 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 FINDINGS  Left Ventricle: There is asymmetric septal hypertrophy with typical HOCM pathophysiology,  but there is also apical entrapment due to mid cavity obliteration. 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 No thrombus is seen. Left ventricular ejection fraction, by estimation, is 70 to 75%. The left ventricle has hyperdynamic function. Definity contrast agent was given IV to delineate the left ventricular endocardial  borders. There is moderate asymmetric left ventricular hypertrophy. Left ventricular diastolic function could not be evaluated. Right Ventricle: The right ventricular size is normal. No increase in right ventricular wall thickness. Right ventricular systolic function is normal. Left Atrium: Left atrial size was moderately dilated. Right Atrium: Right atrial size was normal in size. Pericardium: There is no evidence of pericardial effusion. Mitral Valve: There is severe systolic anterior motion of the mitral leaflets. There is at least moderate, probably severe mitral insufficiency due to [facility]. Mild mitral annular calcification. Severe mitral valve regurgitation, with posteriorly-directed jet. Tricuspid Valve: The tricuspid valve is not well visualized. Tricuspid valve regurgitation is mild. Aortic Valve: The right coronary cusp is severely calcified and immobile, but the other two cusps move well. The aortic outflow tract gradient appears to be largely due to dynamicsubvalvular obstruction due to [facility] and not due to valvular disease. Peak gradient at rest is approximately 65 mm Hg. The aortic valve is tricuspid. There is moderate calcification of the aortic valve. There is moderate thickening of the aortic valve. Aortic valve regurgitation is trivial. Pulmonic Valve: The pulmonic valve was not well visualized. Aorta: The aortic root is normal in size and structure. IAS/Shunts: The interatrial septum was not assessed. Additional Comments: Spectral Doppler performed. Color Doppler performed.  LEFT VENTRICLE PLAX 2D LVIDd:         4.00 cm LVIDs:         1.55 cm LV PW:         1.40 cm LV IVS:        1.80 cm LVOT diam:     1.90 cm LVOT Area:     2.84 cm  LEFT ATRIUM         Index LA diam:    3.30 cm 1.92 cm/m  TRICUSPID VALVE TR Peak grad:   31.3 mmHg TR Vmax:        279.84 cm/s  SHUNTS Systemic Diam: 1.90 cm Thurmon Fair MD Electronically signed by Thurmon Fair MD Signature Date/Time: 10/18/2022/2:13:06 PM    Final      Cardiac Studies   Echo 10/18/22   1. 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  No thrombus is seen. Left ventricular ejection  fraction, by estimation, is 70 to 75%. The left ventricle has hyperdynamic  function. There is moderate asymmetric left ventricular hypertrophy. Left  ventricular diastolic function could not be evaluated.   2. Right ventricular systolic function is normal. The right ventricular  size is normal.   3. Left atrial size was moderately dilated.   4. There is severe systolic anterior motion of the mitral leaflets. There  is at least moderate, probably severe mitral insufficiency due to [facility]..  Severe mitral valve regurgitation.   5. The right coronary cusp is severely calcified and immobile, but the  other two cusps move well. The aortic outflow tract gradient appears to be  largely due to dynamicsubvalvular obstruction due to [facility] and not due to  valvular disease. Peak gradient at  rest is approximately 65 mm Hg. The aortic valve is  tricuspid. There is  moderate calcification of the aortic valve. There is moderate thickening  of the aortic valve. Aortic valve regurgitation is trivial.   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   Patient Profile     82 y.o. female with HTN, HLD, CAD (stent to OM1 2018 with Lat 1st Mrg lesion, 60 %stenosed. Bifurcation lesion jailed by the initial stent treated with rescue PTCA, residual nonobstructive CAD), HCM, gout, rheumatoid arthritis. Her HOCM (septal variant) with preserved LVEF and LVOT gradient of 100.   She was unable to do stress test due to severe gradient.  In 2023 she wore a monitor and no atrial fib no NSVT.  05/10/22 underwent cardiac PET scan which  was intermediate risk due to the presence of decreased global myocardial blood flow reserve which may indicate the presence of microvascular disease, however, cannot rule out underlying obstructive CAD given the presence of extensive coronary calcification. Rest EF 55%, stress EF 60%. Coronary calcifications were present in the left anterior descending artery, left circumflex artery and right coronary artery distribution(s).  Dr. Izora Ribas noted if worsening CP would bring back earlier with evidence of microvascular flow abnormalities and CAC.   Admitted with chest pain, difficult to describe, with black stool and GIB. Cardiology asked to see for elevated troponin and HCM.  Assessment & Plan    1. ABL anemia with GIB - EGD aborted 10/19/22 due to poor prep, to re-try today - Hgb 8.3 on admission, previously 10-12 range, will clarify Hgb goal with MD - stable at 9.3 today  2. Chest pain, elevated troponin felt due to demand ischemia, known CAD with prior stent to OM1 2018 with residual disease managed medically - PET 04/2022 was intermediate risk - hsTroponin trended from 318 --> 846 - with HOCM and hypotension this admission likely contributing to a demand ischemia picture - not presently a candidate for interventional strategy due to issues with GIB - off ASA with GIB.  Restart ASA once OK per GI - continue atorvastatin, ezetimibe with f/u lipid panel in AM  4. HOCM with septal variant, ?developing aneurysm with severe MR felt due to [facility] Repeat limited echo yesterday with asymmetric septal hypertrophy with typical HOCM findings but also apical entrapment due to mid cavity obliteration - apical dyskinesis/developing aneurysm but no thrombus seen - hyperdynamic LVEF 70-75%, moderate LAE, severe SAM with severe MR - right coronary cusp immobile, but aortic outflow gradient due to [facility] - will be sensitive to dehydration, will need to stay hydrated - Dr. Bjorn Pippin recommends close OP f/u with  HCM clinic post procedure  For questions or updates, please contact [facility] Please consult [email or web address] for contact info under Cardiology/STEMI.  Signed, Laurann Montana, PA-C 10/20/2022, 9:20 AM    Patient seen and examined.  Agree with above documentation.  On exam, patient is alert and oriented, regular rate and rhythm, no murmurs, lungs CTAB, no LE edema or JVD.  Underwent EGD/colonoscopy today, results pending.  She is staying hemodynamically stable, encouraged to stay well-hydrated to avoid worsening LVOT obstruction.  Little Ishikawa, MD

## 2022-10-20 NOTE — Op Note (Signed)
[facility] Patient Name: Dana [name] Procedure Date : 10/20/2022 MRN: [id number] Attending MD: Tressia Danas MD, MD, [id number] Date of Birth: 10/04/1940 CSN: [id number] Age: 82 Admit Type: Inpatient Procedure:                Colonoscopy Indications:              Gastrointestinal bleeding Providers:                Tressia Danas MD, MD, Geralyn Corwin, RN, Harrington Challenger, Technician Referring MD:             Jeani Hawking, MD Medicines:                Monitored Anesthesia Care Complications:            No immediate complications. Estimated Blood Loss:     Estimated blood loss was minimal. Procedure:                Pre-Anesthesia Assessment:                           - Prior to the procedure, a History and Physical                            was performed, and patient medications and                            allergies were reviewed. The patient's tolerance of                            previous anesthesia was also reviewed. The risks                            and benefits of the procedure and the sedation                            options and risks were discussed with the patient.                            All questions were answered, and informed consent                            was obtained. Prior Anticoagulants: The patient has                            taken no anticoagulant or antiplatelet agents. ASA                            Grade Assessment: IV - A patient with severe                            systemic disease that is a constant threat to life.  After reviewing the risks and benefits, the patient                            was deemed in satisfactory condition to undergo the                            procedure.                           After obtaining informed consent, the colonoscope                            was passed under direct vision. Throughout the                            procedure,  the patient's blood pressure, pulse, and                            oxygen saturations were monitored continuously. The                            CF-HQ190L ([id number] Olympus coloscope was                            introduced through the anus and advanced to the the                            cecum, identified by appendiceal orifice and                            ileocecal valve. The colonoscopy was performed                            without difficulty. The patient tolerated the                            procedure well. The quality of the bowel                            preparation was good except in the right colon was                            residual sem-formed stool and food fibers remained.                            The terminal ileum, ileocecal valve, appendiceal                            orifice, and rectum were photographed. Scope In: 11:19:58 AM Scope Out: 11:38:15 AM Scope Withdrawal Time: 0 hours 11 minutes 21 seconds  Total Procedure Duration: 0 hours 18 minutes 17 seconds  Findings:      The perianal and digital rectal examinations were normal.      Non-bleeding internal hemorrhoids were found.      A few  small-mouthed diverticula were found in the sigmoid colon.      A 4 mm polyp was found in the recto-sigmoid colon. The polyp was       sessile. The polyp was removed with a cold snare. Resection and       retrieval were complete. Estimated blood loss was minimal.      One 8 mm submucosal nodule was found in the rectum. Negative pillow       sign. Tunneled biopsies were taken with a cold forceps for histology.       Estimated blood loss was minimal.      A moderate amount of semi-solid stool was found in the ascending colon       and in the cecum, making visualization difficult. Small and flat lesions       could be missed in this area.      The exam was otherwise without abnormality on direct and retroflexion       views. Impression:               - Non-bleeding  internal hemorrhoids.                           - Diverticulosis in the sigmoid colon.                           - One 4 mm polyp at the recto-sigmoid colon,                            removed with a cold snare. Resected and retrieved.                           - Submucosal nodule in the rectum. Biopsied.                           - Stool in the ascending colon and in the cecum.                           - No obvious source to her anemia identified on                            this procedure. Recommendation:           - Return patient to hospital ward for ongoing care.                           - Advance diet as tolerated.                           - Continue present medications.                           - Await pathology results.                           - Repeat colonoscopy for colon cancer surveillance                            is not recommended due  to current age. Procedure Code(s):        --- Professional ---                           [phone], Colonoscopy, flexible; with removal of                            tumor(s), polyp(s), or other lesion(s) by snare                            technique                           [id number], 59, Colonoscopy, flexible; with biopsy,                            single or multiple Diagnosis Code(s):        --- Professional ---                           K64.8, Other hemorrhoids                           D12.7, Benign neoplasm of rectosigmoid junction                           K62.89, Other specified diseases of anus and rectum                           K92.2, Gastrointestinal hemorrhage, unspecified                           K57.30, Diverticulosis of large intestine without                            perforation or abscess without bleeding CPT copyright 2022 [employer]. All rights reserved. The codes documented in this report are preliminary and upon coder review may  be revised to meet current compliance requirements. Tressia Danas MD, MD 10/20/2022 1:05:13 PM This report has been signed electronically. Number of Addenda: 0

## 2022-10-20 NOTE — Progress Notes (Signed)
PROGRESS NOTE    Dana [name]  EAV:[id number] DOB: 14-Dec-1940 DOA: 10/15/2022  PCP: [name], MD   Brief Narrative:  This 82 y.o. female with medical history significant of hypertension, hyperlipidemia, CAD s/p stent placement, GERD who presents to the emergency department due to 2-day onset of intermittent left parasternal chest pain.  Cardiologist at [facility] was consulted due to elevated troponin and it was recommended for patient to be admitted to [facility] for cardiology to be consulted after patient arrives to [facility] per [facility] EDP. Gastroenterologist was consulted and she is scheduled for EGD and colonoscopy in am.  EGD and colonoscopy was canceled due to poor preparation.  Assessment & Plan:   Principal Problem:   GI bleed Active Problems:   Essential hypertension   Mixed hyperlipidemia   Symptomatic anemia   Elevated troponin   GERD without esophagitis   Elevated brain natriuretic peptide (BNP) level   Hypokalemia   Blood in the stool   Gastritis and gastroduodenitis   Adenomatous polyp of sigmoid colon   Nodule of colon  Symptomatic anemia: GI bleed: Acute blood loss anemia: Hemoglobin on this admission around 8.3, dropped from 11.4 from 05/2022.  FOBT is positive, with melanotic stools. Hemoglobin this morning stable around 9.0 Continue with IV PPI BID. Anemia panel pending GI consulted and plan for EGD and colonoscopy. EGD and colonoscopy is canceled today due to poor preparation. H&H remains stable.  No obvious visible bleeding. Patient underwent EGD and colonoscopy today found to have gastritis,  multiple diverticulosis.  Polyp removed.  Biopsies pending.   Chest Pain:  CAD, S/P PTCA, Elevated troponins sec to type 2 demand ischemia. Troponin 318 > 315. 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  Currently Patient denies any chest pain, shortness of breath.  Cardiology on board. Needs close  follow up in HCM clinic on discharge. Restart aspirin once okay per GI. Continue to remain hydrated.    Hypokalemia  Replaced.  Resolved.   Hypertension:  BP parameters are borderline low. Hold the verapamil.   Hyperlipidemia:  Resume statin and zetia.    GERD: Stable. On PPI.      Estimated body mass index is 28.33 kg/m as calculated from the following:   Height as of this encounter:  (1.575 m).   Weight as of this encounter: 70.3 kg.   DVT prophylaxis: SCDs Code Status: Full code Family Communication: Son at bed side Disposition Plan:   Status is: Inpatient Remains inpatient appropriate because: Pending EGD and colonoscopy.   Consultants:  Cardiology Gastroenterology  Procedures: Echocardiogram Antimicrobials: None  Subjective: Patient was seen and examined at bedside.  Overnight events noted. Patient finally underwent EGD and colonoscopy, found to have gastritis and polyps which was removed. Patient reports doing better.  Denies any pain.  Objective: Vitals:   10/20/22 0945 10/20/22 1146 10/20/22 1200 10/20/22 1225  BP: 123/66 100/60 126/66 136/64  Pulse: 74 76 72 72  Resp: (!) 22 (!) 24 (!) 24 20  Temp: (!) 97 F (36.1 C) 98 F (36.7 C) 97.6 F (36.4 C) (!) 97.5 F (36.4 C)  TempSrc: Temporal   Oral  SpO2: 97% 98% 99%   Weight: 70.3 kg     Height:  (1.575 m)       Intake/Output Summary (Last 24 hours) at 10/20/2022 1322 Last data filed at 10/20/2022 1138 Gross per 24 hour  Intake 740 ml  Output 1 ml  Net 739 ml  Filed Weights   10/15/22 2014 10/16/22 2054 10/20/22 0945  Weight: 74 kg 70.3 kg 70.3 kg    Examination:  General exam: Appears comfortable, not in any acute distress.  Deconditioned. Respiratory system: Clear to auscultation. Respiratory effort normal.  RR 17 Cardiovascular system: S1 & S2 heard, rate and rhythm, no murmur. Gastrointestinal system: Abdomen is soft, non tender, non distended, BS+ Central nervous system:  Alert and oriented x 3, no focal neurological deficits. Extremities: Symmetric 5 x 5 power. Skin: No rashes, lesions or ulcers Psychiatry: Judgement and insight appear normal. Mood & affect appropriate.     Data Reviewed: I have personally reviewed following labs and imaging studies  CBC: Recent Labs  Lab 10/15/22 2100 10/16/22 0707 10/16/22 2331 10/18/22 0324 10/20/22 0302  WBC 6.3 5.0  --  5.9 5.2  NEUTROABS  --   --   --  3.8  --   HGB 8.3* 9.3* 9.2* 9.0* 9.3*  HCT 25.6* 28.4* 28.5* 26.3* 28.3*  MCV 98.8 95.9  --  94.3 97.3  PLT 212 156  --  185 242   Basic Metabolic Panel: Recent Labs  Lab 10/16/22 0707 10/17/22 1016 10/18/22 0324 10/19/22 0204 10/20/22 0302  NA 136 136 137 139 138  K 3.2* 3.9 3.5 3.3* 3.7  CL 104 104 108 109 112*  CO2 19* 20*  GLUCOSE 95 100* 91 92 84  BUN 12 8 7* 7* 5*  CREATININE 0.68 0.73 0.70 0.63 0.55  CALCIUM 7.8* 8.1* 7.8* 7.4* 7.8*  MG 1.8  --   --   --  1.8  PHOS 3.4  --   --   --  3.5   GFR: Estimated Creatinine Clearance: 51.5 mL/min (by C-G formula based on SCr of 0.55 mg/dL). Liver Function Tests: Recent Labs  Lab 10/16/22 0707  AST 21  ALT 13  ALKPHOS 83  BILITOT 0.8  PROT 5.9*  ALBUMIN 2.3*   No results for input(s): "LIPASE", "AMYLASE" in the last 168 hours. No results for input(s): "AMMONIA" in the last 168 hours. Coagulation Profile: No results for input(s): "INR", "PROTIME" in the last 168 hours. Cardiac Enzymes: No results for input(s): "CKTOTAL", "CKMB", "CKMBINDEX", "TROPONINI" in the last 168 hours. BNP (last 3 results) No results for input(s): "PROBNP" in the last 8760 hours. HbA1C: No results for input(s): "HGBA1C" in the last 72 hours. CBG: No results for input(s): "GLUCAP" in the last 168 hours. Lipid Profile: No results for input(s): "CHOL", "HDL", "LDLCALC", "TRIG", "CHOLHDL", "LDLDIRECT" in the last 72 hours. Thyroid Function Tests: No results for input(s): "TSH", "T4TOTAL", "FREET4",  "T3FREE", "THYROIDAB" in the last 72 hours. Anemia Panel: No results for input(s): "VITAMINB12", "FOLATE", "FERRITIN", "TIBC", "IRON", "RETICCTPCT" in the last 72 hours. Sepsis Labs: Recent Labs  Lab 10/15/22 2100 10/15/22 2301  LATICACIDVEN 1.6 1.5    No results found for this or any previous visit (from the past 240 hour(s)).   Radiology Studies: No results found.  Scheduled Meds:  atorvastatin  10 mg Oral Daily   calcium-vitamin D  1 tablet Oral Q breakfast   ezetimibe  10 mg Oral Daily   ferrous sulfate  325 mg Oral Q breakfast   linaclotide  290 mcg Oral QAC breakfast   pantoprazole (PROTONIX) IV  40 mg Intravenous Q12H   Continuous Infusions:   LOS: 4 days    Time spent: 35 mins   Willeen Niece, MD [facility] HospitalistsNeeds close follow-up If 7PM-7AM, please contact night-coverage

## 2022-10-20 NOTE — Transfer of Care (Signed)
Immediate Anesthesia Transfer of Care Note  Patient: Dana [name]  Procedure(s) Performed: COLONOSCOPY WITH PROPOFOL ESOPHAGOGASTRODUODENOSCOPY (EGD) WITH PROPOFOL BIOPSY POLYPECTOMY  Patient Location: PACU  Anesthesia Type:MAC  Level of Consciousness: drowsy  Airway & Oxygen Therapy: Patient Spontanous Breathing and Patient connected to nasal cannula oxygen  Post-op Assessment: Report given to RN and Post -op Vital signs reviewed and stable  Post vital signs: Reviewed and stable  Last Vitals:  Vitals Value Taken Time  BP 100/60   Temp    Pulse 76 10/20/22 1151  Resp 34 10/20/22 1151  SpO2 99 % 10/20/22 1151  Vitals shown include unvalidated device data.  Last Pain:  Vitals:   10/20/22 0945  TempSrc: Temporal  PainSc:       Patients Stated Pain Goal: 0 (10/19/22 1952)  Complications: No notable events documented.

## 2022-10-20 NOTE — Anesthesia Procedure Notes (Signed)
Procedure Name: MAC Date/Time: 10/20/2022 11:32 AM  Performed by: Randon Goldsmith, CRNAPre-anesthesia Checklist: Patient identified, Emergency Drugs available, Suction available and Patient being monitored Patient Re-evaluated:Patient Re-evaluated prior to induction Oxygen Delivery Method: Nasal cannula Preoxygenation: Pre-oxygenation with 100% oxygen Induction Type: IV induction

## 2022-10-20 NOTE — Op Note (Signed)
[facility] Patient Name: Dana [name] Procedure Date : 10/20/2022 MRN: [id number] Attending MD: Tressia Danas MD, MD, [id number] Date of Birth: October 03, 1940 CSN: [id number] Age: 82 Admit Type: Inpatient Procedure:                Upper GI endoscopy Indications:              Iron deficiency anemia secondary to chronic blood                            loss Providers:                Tressia Danas MD, MD, Geralyn Corwin, RN, Harrington Challenger, Technician Referring MD:             Jeani Hawking, MD Medicines:                Monitored Anesthesia Care Complications:            No immediate complications. Estimated blood loss:                            None. Estimated Blood Loss:     Estimated blood loss was minimal. Procedure:                Pre-Anesthesia Assessment:                           - Prior to the procedure, a History and Physical                            was performed, and patient medications and                            allergies were reviewed. The patient's tolerance of                            previous anesthesia was also reviewed. The risks                            and benefits of the procedure and the sedation                            options and risks were discussed with the patient.                            All questions were answered, and informed consent                            was obtained. Prior Anticoagulants: The patient has                            taken no anticoagulant or antiplatelet agents. ASA  Grade Assessment: IV - A patient with severe                            systemic disease that is a constant threat to life.                            After reviewing the risks and benefits, the patient                            was deemed in satisfactory condition to undergo the                            procedure.                           After obtaining informed consent, the  endoscope was                            passed under direct vision. Throughout the                            procedure, the patient's blood pressure, pulse, and                            oxygen saturations were monitored continuously. The                            [id number]-[id number] ([id number]) Olympus endoscope was introduced                            through the mouth, and advanced to the second part                            of duodenum. The upper GI endoscopy was                            accomplished without difficulty. The patient                            tolerated the procedure well. 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 The esophageal       mucosa is otherwise normal .      Localized mildly erythematous mucosa without bleeding was found in the       gastric antrum. In one area, it had a nodular appearance. Biopsies were       taken from the antrum, body, fundus and the area of nodularity with a       cold forceps for histology. Estimated blood loss was minimal.      The examined duodenum was normal. Biopsies were taken with a cold       forceps for histology. Estimated blood loss was minimal.      The cardia and gastric fundus were normal on retroflexion. 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                           - Erythematous mucosa in the antrum. Biopsied.                           - Normal examined duodenum. Biopsied.                           - No obvious source of anemia identified on this                            study. Recommendation:           - Proceed with colonoscopy today as planned.                           - Await biopsy results.                           - PPI BID. Procedure Code(s):        --- Professional ---                           [phone], Esophagogastroduodenoscopy, flexible,                            transoral; with biopsy, single or multiple Diagnosis Code(s):         --- Professional ---                           Q39.4, Esophageal web                           K31.89, Other diseases of stomach and duodenum                           D50.0, Iron deficiency anemia secondary to blood                            loss (chronic) CPT copyright 2022 [employer]. All rights reserved. The codes documented in this report are preliminary and upon coder review may  be revised to meet current compliance requirements. Tressia Danas MD, MD 10/20/2022 12:54:53 PM This report has been signed electronically. Number of Addenda: 0

## 2022-10-20 NOTE — Anesthesia Postprocedure Evaluation (Signed)
Anesthesia Post Note  Patient: Kathrynne Harris  Procedure(s) Performed: COLONOSCOPY WITH PROPOFOL ESOPHAGOGASTRODUODENOSCOPY (EGD) WITH PROPOFOL BIOPSY POLYPECTOMY     Patient location during evaluation: PACU Anesthesia Type: MAC Level of consciousness: awake and alert Pain management: pain level controlled Vital Signs Assessment: post-procedure vital signs reviewed and stable Respiratory status: spontaneous breathing, nonlabored ventilation and respiratory function stable Cardiovascular status: blood pressure returned to baseline Postop Assessment: no apparent nausea or vomiting Anesthetic complications: no   No notable events documented.     Shanda Howells

## 2022-10-20 NOTE — Interval H&P Note (Signed)
History and Physical Interval Note:  10/20/2022 9:48 AM  Dana [name]  has presented today for surgery, with the diagnosis of anemia.  The various methods of treatment have been discussed with the patient and family. After consideration of risks, benefits and other options for treatment, the patient has consented to  Procedure(s): COLONOSCOPY WITH PROPOFOL (N/A) ESOPHAGOGASTRODUODENOSCOPY (EGD) WITH PROPOFOL (N/A) as a surgical intervention.  The patient's history has been reviewed, patient examined, no change in status, stable for surgery.  I have reviewed the patient's chart and labs.  Questions were answered to the patient's satisfaction.     Tressia Danas

## 2022-10-21 DIAGNOSIS — K922 Gastrointestinal hemorrhage, unspecified: Secondary | ICD-10-CM | POA: Diagnosis not present

## 2022-10-21 LAB — BASIC METABOLIC PANEL
Anion gap: 5 (ref 5–15)
BUN: <5 mg/dL — ABNORMAL LOW (ref 8–23)
CO2: 20 mmol/L — ABNORMAL LOW (ref 22–32)
Calcium: 7.6 mg/dL — ABNORMAL LOW (ref 8.9–10.3)
Chloride: 110 mmol/L (ref 98–111)
Creatinine, Ser: 0.61 mg/dL (ref 0.44–1.00)
GFR, Estimated: >60 mL/min (ref >60)
Glucose, Bld: 86 mg/dL (ref 70–99)
Potassium: 3.6 mmol/L (ref 3.5–5.1)
Sodium: 135 mmol/L (ref 135–145)

## 2022-10-21 LAB — CBC
HCT: 28 % — ABNORMAL LOW (ref 36.0–46.0)
Hemoglobin: 9.5 g/dL — ABNORMAL LOW (ref 12.0–15.0)
MCH: 31.9 pg (ref 26.0–34.0)
MCHC: 33.9 g/dL (ref 30.0–36.0)
MCV: 94 fL (ref 80.0–100.0)
Platelets: 262 10*3/uL (ref 150–400)
RBC: 2.98 MIL/uL — ABNORMAL LOW (ref 3.87–5.11)
RDW: 16.2 % — ABNORMAL HIGH (ref 11.5–15.5)
WBC: 5.4 10*3/uL (ref 4.0–10.5)
nRBC: 0 % (ref 0.0–0.2)

## 2022-10-21 LAB — LIPID PANEL
Cholesterol: 84 mg/dL (ref 0–200)
HDL: 27 mg/dL — ABNORMAL LOW (ref >40)
LDL Cholesterol: 44 mg/dL (ref 0–99)
Total CHOL/HDL Ratio: 3.1 RATIO
Triglycerides: 65 mg/dL (ref <150)
VLDL: 13 mg/dL (ref 0–40)

## 2022-10-21 NOTE — Plan of Care (Signed)
  Problem: Education: Goal: Knowledge of General Education information will improve Description: Including pain rating scale, medication(s)/side effects and non-pharmacologic comfort measures 10/21/2022 1007 by Herma Carson, RN Outcome: Adequate for Discharge 10/21/2022 0802 by Herma Carson, RN Outcome: Progressing   Problem: Health Behavior/Discharge Planning: Goal: Ability to manage health-related needs will improve 10/21/2022 1007 by Herma Carson, RN Outcome: Adequate for Discharge 10/21/2022 0802 by Herma Carson, RN Outcome: Progressing   Problem: Clinical Measurements: Goal: Ability to maintain clinical measurements within normal limits will improve 10/21/2022 1007 by Herma Carson, RN Outcome: Adequate for Discharge 10/21/2022 0802 by Herma Carson, RN Outcome: Progressing Goal: Will remain free from infection 10/21/2022 1007 by Herma Carson, RN Outcome: Adequate for Discharge 10/21/2022 0802 by Herma Carson, RN Outcome: Progressing Goal: Diagnostic test results will improve 10/21/2022 1007 by Herma Carson, RN Outcome: Adequate for Discharge 10/21/2022 0802 by Herma Carson, RN Outcome: Progressing Goal: Respiratory complications will improve 10/21/2022 1007 by Herma Carson, RN Outcome: Adequate for Discharge 10/21/2022 0802 by Herma Carson, RN Outcome: Progressing Goal: Cardiovascular complication will be avoided 10/21/2022 1007 by Herma Carson, RN Outcome: Adequate for Discharge 10/21/2022 0802 by Herma Carson, RN Outcome: Progressing   Problem: Activity: Goal: Risk for activity intolerance will decrease 10/21/2022 1007 by Herma Carson, RN Outcome: Adequate for Discharge 10/21/2022 0802 by Herma Carson, RN Outcome: Progressing   Problem: Nutrition: Goal: Adequate nutrition will be maintained 10/21/2022 1007 by Herma Carson, RN Outcome: Adequate for Discharge 10/21/2022 0802 by Herma Carson, RN Outcome: Progressing    Problem: Coping: Goal: Level of anxiety will decrease 10/21/2022 1007 by Herma Carson, RN Outcome: Adequate for Discharge 10/21/2022 0802 by Herma Carson, RN Outcome: Progressing   Problem: Elimination: Goal: Will not experience complications related to bowel motility 10/21/2022 1007 by Herma Carson, RN Outcome: Adequate for Discharge 10/21/2022 0802 by Herma Carson, RN Outcome: Progressing Goal: Will not experience complications related to urinary retention 10/21/2022 1007 by Herma Carson, RN Outcome: Adequate for Discharge 10/21/2022 0802 by Herma Carson, RN Outcome: Progressing   Problem: Pain Managment: Goal: General experience of comfort will improve 10/21/2022 1007 by Herma Carson, RN Outcome: Adequate for Discharge 10/21/2022 0802 by Herma Carson, RN Outcome: Progressing   Problem: Safety: Goal: Ability to remain free from injury will improve 10/21/2022 1007 by Herma Carson, RN Outcome: Adequate for Discharge 10/21/2022 0802 by Herma Carson, RN Outcome: Progressing   Problem: Skin Integrity: Goal: Risk for impaired skin integrity will decrease 10/21/2022 1007 by Herma Carson, RN Outcome: Adequate for Discharge 10/21/2022 0802 by Herma Carson, RN Outcome: Progressing

## 2022-10-21 NOTE — Discharge Summary (Signed)
Physician Discharge Summary  Dana [name] ZOX:[id number] DOB: 1941-04-23 DOA: 10/15/2022  PCP: [name], MD  Admit date: 10/15/2022  Discharge date: 10/21/2022  Admitted From: Home  Disposition:  Home  Recommendations for Outpatient Follow-up:  Follow up with PCP in 1-2 weeks. Please obtain BMP/CBC in one week. Advised to follow-up with gastroenterology in 2 weeks. Patient underwent EGD and colonoscopy which was unremarkable.  Polyps was removed. Advised to follow-up with HCM clinic for HoCM.  Home Health:None Equipment/Devices:None  Discharge Condition: Stable CODE STATUS:Full code Diet recommendation: Heart Healthy   Brief Summary / Hospital Course: This 82 y.o. female with medical history significant of hypertension, hyperlipidemia, CAD s/p stent placement, GERD who presents to the emergency department due to 2-day onset of intermittent left parasternal chest pain.  She was also found to have significant drop in hemoglobin from 11-8 in a month.  Cardiologist at [facility] was consulted due to elevated troponin and patient was admitted at [facility] . Gastroenterologist was consulted and she underwent EGD and colonoscopy which shows diverticulosis, gastritis and polyp which was removed.  Biopsies pending.  Patient is cleared from GI to be discharged.  Cardiologist states elevated troponins were due to demand ischemia in the setting of anemia.  Recommended follow-up with hypertrophic cardiomyopathy clinic.  Patient feels better and wants to be discharged.   Discharge Diagnoses:  Principal Problem:   GI bleed Active Problems:   Essential hypertension   Mixed hyperlipidemia   Symptomatic anemia   Elevated troponin   GERD without esophagitis   Elevated brain natriuretic peptide (BNP) level   Hypokalemia   Blood in the stool   Gastritis and gastroduodenitis   Adenomatous polyp of sigmoid colon   Nodule of colon  Symptomatic anemia: GI bleed: Acute blood loss anemia: Hemoglobin on  this admission around 8.3, dropped from 11.4 from 05/2022.  FOBT is positive, with melanotic stools. Hemoglobin this morning stable around 9.0 Continue with IV PPI BID. Anemia panel pending GI consulted and plan for EGD and colonoscopy. EGD and colonoscopy is canceled today due to poor preparation. H&H remains stable.  No obvious visible bleeding. Patient underwent EGD and colonoscopy today found to have gastritis,  multiple diverticulosis.  Polyp removed.  Biopsies pending. GI signed off,  recommended patient can be discharged.` H/H remains stable.   Chest Pain:  CAD, S/P PTCA, Elevated troponins sec to type 2 demand ischemia. Troponin 318 > 315. 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  Currently Patient denies any chest pain, shortness of breath.  Cardiology on board. Needs close follow up in HCM clinic on discharge. Aspirin resumed as per GI.  Continue to remain hydrated.    Hypokalemia  Replaced.  Resolved.   Hypertension:  BP parameters are borderline low. Hold the verapamil. Blood pressure improved to resume verapamil.   Hyperlipidemia:  Resume statin and zetia.    GERD: Stable. On PPI.      Estimated body mass index is 28.33 kg/m as calculated from the following:   Height as of this encounter: 5\' 2"  (1.575 m).   Weight as of this encounter: 70.3 kg.  Discharge Instructions  Discharge Instructions     Call MD for:  difficulty breathing, headache or visual disturbances   Complete by: As directed    Call MD for:  persistant dizziness or light-headedness   Complete by: As directed    Call MD for:  persistant nausea and vomiting   Complete by: As directed  Diet - low sodium heart healthy   Complete by: As directed    Diet Carb Modified   Complete by: As directed    Discharge instructions   Complete by: As directed    Advised to follow-up with primary care physician in 1  week. Advised to follow-up with gastroenterology in 2 weeks. Patient underwent EGD and colonoscopy which was unremarkable.  Polyps were removed.   Increase activity slowly   Complete by: As directed       Allergies as of 10/21/2022   No Known Allergies      Medication List     STOP taking these medications    benzonatate 200 MG capsule Commonly known as: TESSALON   doxycycline 100 MG capsule Commonly known as: VIBRAMYCIN   mupirocin ointment 2 % Commonly known as: BACTROBAN       TAKE these medications    acetaminophen 325 MG tablet Commonly known as: TYLENOL Take 2 tablets (650 mg total) by mouth every 6 (six) hours as needed for mild pain, fever or headache (or Fever >/= 101).   alendronate 70 MG tablet Commonly known as: FOSAMAX Take 70 mg by mouth every Wednesday.   Allergy Relief 10 MG tablet Generic drug: loratadine Take 10 mg by mouth daily.   allopurinol 300 MG tablet Commonly known as: ZYLOPRIM Take 300 mg by mouth daily.   aspirin EC 81 MG tablet Take 1 tablet (81 mg total) by mouth daily with breakfast.   atorvastatin 10 MG tablet Commonly known as: LIPITOR Take 10 mg by mouth daily.   ezetimibe 10 MG tablet Commonly known as: ZETIA Take 10 mg by mouth daily.   famotidine 20 MG tablet Commonly known as: PEPCID Take 1 tablet (20 mg total) by mouth 2 (two) times daily.   ferrous sulfate 325 (65 FE) MG tablet Take 325 mg by mouth daily with breakfast.   folic acid 1 MG tablet Commonly known as: FOLVITE TAKE 1 TABLET BY MOUTH ONCE A DAY FOR SUPPLEMENT DUE TO METHOTREXATE. Orally Once a day for 90 days   folic acid 1 MG tablet Commonly known as: FOLVITE Take 1 mg by mouth daily.   furosemide 20 MG tablet Commonly known as: LASIX Take 10 mg by mouth daily.   methotrexate 2.5 MG tablet Commonly known as: RHEUMATREX Take 20 mg by mouth every Wednesday.   nitroGLYCERIN 0.4 MG SL tablet Commonly known as: NITROSTAT PLACE 1 TABLET  UNDER THE TONGUE EVERY 5 MINUTES AS NEEDED FOR CHEST PAIN What changed: See the new instructions.   pantoprazole 40 MG tablet Commonly known as: PROTONIX Take 1 tablet (40 mg total) by mouth daily.   sucralfate 1 g tablet Commonly known as: CARAFATE Take 1 g by mouth 2 (two) times daily.   verapamil 360 MG 24 hr capsule Commonly known as: VERELAN TAKE (1) CAPSULE BY MOUTH AT BEDTIME. What changed: See the new instructions.   Vitamin D (Cholecalciferol) 25 MCG (1000 UT) Tabs Take 1 tablet by mouth daily.        Follow-up Information     [name], MD Follow up in 1 week(s).   Specialty: Internal Medicine Contact information: [address] [place] [address] [phone]         Tressia Danas, MD Follow up in 2 week(s).   Specialty: Gastroenterology Contact information: [address] [place] [address] [phone]         Christell Constant, MD Follow up.   Specialty:  Cardiology Why: [facility] [address] location - cardiology follow-up on Tuesday Dec 06, 2022 at 9:40 AM (Arrive by 9:25 AM). Contact information: [address] [place] [address] [phone]         [facility] Follow up.   Specialty: Cardiology Why: [facility] [address] location - genetic counseling appointment with Dr. Jomarie Longs on Tuesday Dec 20, 2022 at 10:00 AM. Arrive 15 minutes prior to check in. Contact information: [address], [address] [id number] mc [place] [address] [phone]               No Known Allergies  Consultations: Gastroenterology Cardiology   Procedures/Studies: ECHOCARDIOGRAM LIMITED  Result Date: 10/18/2022    ECHOCARDIOGRAM LIMITED REPORT   Patient Name:   Dana [name] Date of Exam: 10/18/2022 Medical Rec #:  [id number]    Height:       62.0 in Accession #:    [id number]   Weight:       154.9 lb Date of Birth:  04-26-1941    BSA:           1.715 m Patient Age:    81 years     BP:           123/63 mmHg Patient Gender: F            HR:           83 bpm. Exam Location:  Inpatient Procedure: Limited Echo, Color Doppler, Cardiac Doppler and Intracardiac            Opacification Agent Indications:    Chest Pain  History:        Patient has prior history of Echocardiogram examinations, most                 recent 08/30/2022. Hypertrophic Cardiomyopathy, CAD, Mitral Valve                 Disease, Signs/Symptoms:Chest Pain; Risk Factors:Hypertension                 and Dyslipidemia. Elevated BNP and Troponin.  Sonographer:    Milbert Coulter Referring Phys: [id number] LAURA R INGOLD IMPRESSIONS  1. There is asymmetric septal hypertrophy with typical HOCM pathophysiology, but there is also apical entrapment due to mid cavity obliteration. 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 No thrombus is seen. Left ventricular ejection fraction, by estimation, is 70 to 75%. The left ventricle has hyperdynamic function. There is moderate asymmetric left ventricular hypertrophy. Left ventricular diastolic function could not be evaluated.  2. Right ventricular systolic function is normal. The right ventricular size is normal.  3. Left atrial size was moderately dilated.  4. There is severe systolic anterior motion of the mitral leaflets. There is at least moderate, probably severe mitral insufficiency due to [facility] Severe mitral valve regurgitation.  5. The right coronary cusp is severely calcified and immobile, but the other two cusps move well. The aortic outflow tract gradient appears to be largely due to dynamicsubvalvular obstruction due to [facility] and not due to valvular disease. Peak gradient at rest is approximately 65 mm Hg. The aortic valve is tricuspid. There is moderate calcification of the aortic valve. There is moderate thickening of the aortic valve. Aortic valve regurgitation is trivial. 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 FINDINGS  Left Ventricle: There is asymmetric septal hypertrophy with typical HOCM pathophysiology, but there  is also apical entrapment due to mid cavity obliteration. 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 No thrombus is seen. Left ventricular ejection fraction, by estimation, is 70 to 75%. The left ventricle has hyperdynamic function. Definity contrast agent was given IV to delineate the left ventricular endocardial borders. There is moderate asymmetric left ventricular hypertrophy. Left ventricular diastolic function could not be evaluated. Right Ventricle: The right ventricular size is normal. No increase in right ventricular wall thickness. Right ventricular systolic function is normal. Left Atrium: Left atrial size was moderately dilated. Right Atrium: Right atrial size was normal in size. Pericardium: There is no evidence of pericardial effusion. Mitral Valve: There is severe systolic anterior motion of the mitral leaflets. There is at least moderate, probably severe mitral insufficiency due to [facility]. Mild mitral annular calcification. Severe mitral valve regurgitation, with posteriorly-directed jet. Tricuspid Valve: The tricuspid valve is not well visualized. Tricuspid valve regurgitation is mild. Aortic Valve: The right coronary cusp is severely calcified and immobile, but the other two cusps move well. The aortic outflow tract gradient appears to be largely due to dynamicsubvalvular obstruction due to [facility] and not due to valvular disease. Peak gradient at rest is approximately 65 mm Hg. The aortic valve is tricuspid. There is moderate calcification of the aortic valve. There is moderate thickening of the aortic valve. Aortic valve regurgitation is trivial. Pulmonic Valve: The pulmonic valve was not well visualized. Aorta: The aortic root is normal in size and structure. IAS/Shunts: The interatrial  septum was not assessed. Additional Comments: Spectral Doppler performed. Color Doppler performed.  LEFT VENTRICLE PLAX 2D LVIDd:         4.00 cm LVIDs:         1.55 cm LV PW:         1.40 cm LV IVS:        1.80 cm LVOT diam:     1.90 cm LVOT Area:     2.84 cm  LEFT ATRIUM         Index LA diam:    3.30 cm 1.92 cm/m  TRICUSPID VALVE TR Peak grad:   31.3 mmHg TR Vmax:        279.84 cm/s  SHUNTS Systemic Diam: 1.90 cm Thurmon Fair MD Electronically signed by Thurmon Fair MD Signature Date/Time: 10/18/2022/2:13:06 PM    Final    DG Chest 2 View  Result Date: 10/15/2022 CLINICAL DATA:  Chest pain EXAM: CHEST - 2 VIEW COMPARISON:  X-ray 08/16/2022 and older. FINDINGS: Calcified and tortuous aorta. Normal cardiopericardial silhouette. No consolidation, pneumothorax or effusion. No edema. Overlapping cardiac leads. Air-fluid level along the stomach beneath the left hemidiaphragm. Degenerative changes of the spine on lateral view. IMPRESSION: No acute cardiopulmonary disease Electronically Signed   By: Karen Kays M.D.   On: 10/15/2022 20:41     Subjective: Patient was seen and examined at bedside.  Overnight events noted.   Patient reports doing much better.  Hemoglobin stays stable.  Patient is being discharged home.  Discharge Exam: Vitals:   10/20/22 1225 10/20/22 2049  BP: 136/64 135/69  Pulse: 72 80  Resp: 20 19  Temp: (!) 97.5 F (36.4 C) (!) 97.5 F (36.4 C)  SpO2:     Vitals:   10/20/22 1146 10/20/22 1200 10/20/22 1225 10/20/22 2049  BP: 100/60 126/66 136/64 135/69  Pulse: 76 72 72 80  Resp: (!) 24 (!) 24 20 19   Temp: 98 F (36.7 C) 97.6 F (36.4 C) (!) 97.5 F (36.4  C) (!) 97.5 F (36.4 C)  TempSrc:   Oral Oral  SpO2: 98% 99%    Weight:      Height:        General: Pt is alert, awake, not in acute distress Cardiovascular: RRR, S1/S2 +, no rubs, no gallops Respiratory: CTA bilaterally, no wheezing, no rhonchi Abdominal: Soft, NT, ND, bowel sounds + Extremities: no  edema, no cyanosis    The results of significant diagnostics from this hospitalization (including imaging, microbiology, ancillary and laboratory) are listed below for reference.     Microbiology: No results found for this or any previous visit (from the past 240 hour(s)).   Labs: BNP (last 3 results) Recent Labs    10/15/22 2100  BNP 474.0*   Basic Metabolic Panel: Recent Labs  Lab 10/16/22 0707 10/17/22 1016 10/18/22 0324 10/19/22 0204 10/20/22 0302 10/21/22 0300  NA 136 136 137 139 138 135  K 3.2* 3.9 3.5 3.3* 3.7 3.6  CL 104 104 108 109 112* 110  CO2 26 23 23  19* 20* 20*  GLUCOSE 95 100* 91 92 84 86  BUN 12 8 7* 7* 5* <5*  CREATININE 0.68 0.73 0.70 0.63 0.55 0.61  CALCIUM 7.8* 8.1* 7.8* 7.4* 7.8* 7.6*  MG 1.8  --   --   --  1.8  --   PHOS 3.4  --   --   --  3.5  --    Liver Function Tests: Recent Labs  Lab 10/16/22 0707  AST 21  ALT 13  ALKPHOS 83  BILITOT 0.8  PROT 5.9*  ALBUMIN 2.3*   No results for input(s): "LIPASE", "AMYLASE" in the last 168 hours. No results for input(s): "AMMONIA" in the last 168 hours. CBC: Recent Labs  Lab 10/15/22 2100 10/16/22 0707 10/16/22 2331 10/18/22 0324 10/20/22 0302 10/21/22 0300  WBC 6.3 5.0  --  5.9 5.2 5.4  NEUTROABS  --   --   --  3.8  --   --   HGB 8.3* 9.3* 9.2* 9.0* 9.3* 9.5*  HCT 25.6* 28.4* 28.5* 26.3* 28.3* 28.0*  MCV 98.8 95.9  --  94.3 97.3 94.0  PLT 212 156  --  185 242 262   Cardiac Enzymes: No results for input(s): "CKTOTAL", "CKMB", "CKMBINDEX", "TROPONINI" in the last 168 hours. BNP: Invalid input(s): "POCBNP" CBG: No results for input(s): "GLUCAP" in the last 168 hours. D-Dimer No results for input(s): "DDIMER" in the last 72 hours. Hgb A1c No results for input(s): "HGBA1C" in the last 72 hours. Lipid Profile Recent Labs    10/21/22 0300  CHOL 84  HDL 27*  LDLCALC 44  TRIG 65  CHOLHDL 3.1   Thyroid function studies No results for input(s): "TSH", "T4TOTAL", "T3FREE",  "THYROIDAB" in the last 72 hours.  Invalid input(s): "FREET3" Anemia work up No results for input(s): "VITAMINB12", "FOLATE", "FERRITIN", "TIBC", "IRON", "RETICCTPCT" in the last 72 hours. Urinalysis    Component Value Date/Time   COLORURINE YELLOW 07/15/2018 1212   APPEARANCEUR CLOUDY (A) 07/15/2018 1212   LABSPEC 1.013 07/15/2018 1212   PHURINE 7.0 07/15/2018 1212   GLUCOSEU NEGATIVE 07/15/2018 1212   HGBUR LARGE (A) 07/15/2018 1212   BILIRUBINUR NEGATIVE 07/15/2018 1212   KETONESUR NEGATIVE 07/15/2018 1212   PROTEINUR 100 (A) 07/15/2018 1212   NITRITE POSITIVE (A) 07/15/2018 1212   LEUKOCYTESUR LARGE (A) 07/15/2018 1212   Sepsis Labs Recent Labs  Lab 10/16/22 0707 10/18/22 0324 10/20/22 0302 10/21/22 0300  WBC 5.0 5.9 5.2 5.4  Microbiology No results found for this or any previous visit (from the past 240 hour(s)).   Time coordinating discharge: Over 30 minutes  SIGNED:   Willeen Niece, MD  [facility] 10/21/2022, 3:40 PM Pager   If 7PM-7AM, please contact night-coverage

## 2022-10-21 NOTE — Discharge Instructions (Signed)
Advised to follow-up with primary care physician in 1 week. Advised to follow-up with gastroenterology in 2 weeks. Patient underwent EGD and colonoscopy which was unremarkable.  Polyps was removed. Advised to follow-up with HCM clinic for HoCM.

## 2022-10-21 NOTE — Plan of Care (Signed)

## 2022-10-21 NOTE — Progress Notes (Signed)
Chart reviewed, VSS. 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 IM has written to resume ASA and verapamil on home AVS. Pt feels great and telemetry is benign. Plan OP f/u as scheduled 6/11 with Dr. Izora Ribas with genetic counseling on 6/25, appts updated on AVS (notified nurse to reprint as the original asked her to f/u with Dr. Bjorn Pippin instead).   [facility] will sign off.   Medication Recommendations: Dr. Bjorn Pippin agrees with resumption of ASA, verapamil Other recommendations (labs, testing, etc):  recommend continued close PCP f/u of hemoglobin Follow up as an outpatient:  Dr. Izora Ribas 12/06/22 and genetic counselor Dr. Jomarie Longs on 12/20/22

## 2022-10-24 ENCOUNTER — Encounter (HOSPITAL_COMMUNITY): Payer: Self-pay | Admitting: Gastroenterology

## 2022-10-24 LAB — SURGICAL PATHOLOGY

## 2022-10-29 DIAGNOSIS — R011 Cardiac murmur, unspecified: Secondary | ICD-10-CM | POA: Insufficient documentation

## 2022-11-03 NOTE — Telephone Encounter (Signed)
Will keep appointments as scheduled.

## 2022-12-06 ENCOUNTER — Ambulatory Visit: Payer: Medicare Other | Attending: Internal Medicine | Admitting: Internal Medicine

## 2022-12-06 ENCOUNTER — Encounter: Payer: Medicare Other | Admitting: Genetic Counselor

## 2022-12-06 ENCOUNTER — Encounter: Payer: Self-pay | Admitting: Internal Medicine

## 2022-12-06 VITALS — BP 104/70 | HR 75 | Ht 62.0 in | Wt 157.0 lb

## 2022-12-06 DIAGNOSIS — R7989 Other specified abnormal findings of blood chemistry: Secondary | ICD-10-CM | POA: Diagnosis present

## 2022-12-06 DIAGNOSIS — I10 Essential (primary) hypertension: Secondary | ICD-10-CM | POA: Diagnosis present

## 2022-12-06 DIAGNOSIS — D649 Anemia, unspecified: Secondary | ICD-10-CM | POA: Insufficient documentation

## 2022-12-06 DIAGNOSIS — K921 Melena: Secondary | ICD-10-CM | POA: Diagnosis present

## 2022-12-06 DIAGNOSIS — I25118 Atherosclerotic heart disease of native coronary artery with other forms of angina pectoris: Secondary | ICD-10-CM | POA: Diagnosis present

## 2022-12-06 DIAGNOSIS — I421 Obstructive hypertrophic cardiomyopathy: Secondary | ICD-10-CM | POA: Diagnosis present

## 2022-12-06 DIAGNOSIS — K922 Gastrointestinal hemorrhage, unspecified: Secondary | ICD-10-CM | POA: Diagnosis present

## 2022-12-06 MED ORDER — SODIUM CHLORIDE 0.9% FLUSH
3.0000 mL | Freq: Two times a day (BID) | INTRAVENOUS | Status: DC
Start: 2022-12-06 — End: 2023-07-27

## 2022-12-06 NOTE — Progress Notes (Signed)
Cardiology Office Note:    Date:  12/06/2022   ID:  Dana [name], DOB 11/22/1940, MRN [id number]  PCP:  [name], MD   [facility] Providers Cardiologist:  Nona Dell, MD     Referring MD: [name], MD   CC:  Follow up [facility]  History of Present Illness:    Dana [name] is a 82 y.o. female with a hx of CAD s/p PCI OM1 and HOCM with preserved LVEF and LVOT gradient of 100 mmHg.  She was unable to do stress test due to severe gradient.  She is back from her trip. 2023: started AV nodal therapy with improvement-> now no symptoms, pending genetics follow up.  Was seen in [place] in [place]: once for chest pain and once for shortness of breath requiring increased diuretic. 2024: NSTEMI eval related to GI bleed  Patient notes no SOB at rest and no DOE since leaving the hospital.  Notes no fatigue. No further GI bleeding Notes no palpitations Notes no CP. Notes no Dizziness. Notes no syncope.  Sheryle Hail is not at the visit today; her son in low and her other son who recently moved here is present.  Interpretor service.    Past Medical History:  Diagnosis Date   CAD (coronary artery disease)    a. s/p DES to OM1 in 02/2017   Chest pain    Essential hypertension    Gout    HOCM (hypertrophic obstructive cardiomyopathy) (HCC)    Hyperlipidemia    Rheumatoid arthritis (HCC)     Past Surgical History:  Procedure Laterality Date   BIOPSY  10/20/2022   Procedure: BIOPSY;  Surgeon: Tressia Danas, MD;  Location: [facility] ENDOSCOPY;  Service: Gastroenterology;;   CHOLECYSTECTOMY N/A 08/17/2018   Procedure: LAPAROSCOPIC CHOLECYSTECTOMY;  Surgeon: [name], MD;  Location: [facility] ORS;  Service: General;  Laterality: N/A;   COLONOSCOPY WITH PROPOFOL N/A 10/20/2022   Procedure: COLONOSCOPY WITH PROPOFOL;  Surgeon: Tressia Danas, MD;  Location: [facility] ENDOSCOPY;  Service: Gastroenterology;  Laterality: N/A;   CORONARY BALLOON ANGIOPLASTY N/A 03/03/2017   Procedure: CORONARY BALLOON  ANGIOPLASTY;  Surgeon: Marykay Lex, MD;  Location: [facility] INVASIVE CV LAB;  Service: Cardiovascular;  Laterality: N/A;   CORONARY STENT INTERVENTION N/A 03/03/2017   Procedure: CORONARY STENT INTERVENTION;  Surgeon: Marykay Lex, MD;  Location: [facility] INVASIVE CV LAB;  Service: Cardiovascular;  Laterality: N/A;   ESOPHAGOGASTRODUODENOSCOPY (EGD) WITH PROPOFOL N/A 10/20/2022   Procedure: ESOPHAGOGASTRODUODENOSCOPY (EGD) WITH PROPOFOL;  Surgeon: Tressia Danas, MD;  Location: [facility] ENDOSCOPY;  Service: Gastroenterology;  Laterality: N/A;   LEFT HEART CATH AND CORONARY ANGIOGRAPHY N/A 03/02/2017   Procedure: LEFT HEART CATH AND CORONARY ANGIOGRAPHY;  Surgeon: Tonny Bollman, MD;  Location: [facility] INVASIVE CV LAB;  Service: Cardiovascular;  Laterality: N/A;   POLYPECTOMY  10/20/2022   Procedure: POLYPECTOMY;  Surgeon: Tressia Danas, MD;  Location: [facility] ENDOSCOPY;  Service: Gastroenterology;;    Current Medications: Current Meds  Medication Sig   acetaminophen (TYLENOL) 325 MG tablet Take 2 tablets (650 mg total) by mouth every 6 (six) hours as needed for mild pain, fever or headache (or Fever >/= 101).   alendronate (FOSAMAX) 70 MG tablet Take 70 mg by mouth every Wednesday.   ALLERGY RELIEF 10 MG tablet Take 10 mg by mouth daily.   allopurinol (ZYLOPRIM) 300 MG tablet Take 300 mg by mouth daily.   aspirin EC 81 MG tablet Take 1 tablet (81 mg total) by mouth daily with breakfast.   atorvastatin (LIPITOR) 10 MG  tablet Take 10 mg by mouth daily.   ezetimibe (ZETIA) 10 MG tablet Take 10 mg by mouth daily.   famotidine (PEPCID) 20 MG tablet Take 1 tablet (20 mg total) by mouth 2 (two) times daily.   ferrous sulfate 325 (65 FE) MG tablet Take 325 mg by mouth daily with breakfast.   folic acid (FOLVITE) 1 MG tablet Take 1 mg by mouth daily.   furosemide (LASIX) 20 MG tablet Take 10 mg by mouth daily.   methotrexate 2.5 MG tablet Take 20 mg by mouth every Wednesday.    nitroGLYCERIN (NITROSTAT) 0.4 MG SL  tablet PLACE 1 TABLET UNDER THE TONGUE EVERY 5 MINUTES AS NEEDED FOR CHEST PAIN (Patient taking differently: Place 0.4 mg under the tongue every 5 (five) minutes as needed for chest pain.)   pantoprazole (PROTONIX) 40 MG tablet Take 1 tablet (40 mg total) by mouth daily.   RESTASIS 0.05 % ophthalmic emulsion 1 drop 2 (two) times daily.   sucralfate (CARAFATE) 1 g tablet Take 1 g by mouth 2 (two) times daily.   verapamil (VERELAN PM) 360 MG 24 hr capsule TAKE (1) CAPSULE BY MOUTH AT BEDTIME.   Vitamin D, Cholecalciferol, 25 MCG (1000 UT) TABS Take 1 tablet by mouth daily.   [DISCONTINUED] folic acid (FOLVITE) 1 MG tablet    Current Facility-Administered Medications for the 12/06/22 encounter (Office Visit) with Christell Constant, MD  Medication   sodium chloride flush (NS) 0.9 % injection 3 mL     Allergies:   Patient has no known allergies.   Social History   Socioeconomic History   Marital status: Widowed    Spouse name: Not on file   Number of children: Not on file   Years of education: Not on file   Highest education level: Not on file  Occupational History   Not on file  Tobacco Use   Smoking status: Never   Smokeless tobacco: Never  Vaping Use   Vaping Use: Never used  Substance and Sexual Activity   Alcohol use: No   Drug use: No   Sexual activity: Not on file  Other Topics Concern   Not on file  Social History Narrative   Not on file   Social Determinants of Health   Financial Resource Strain: Not on file  Food Insecurity: No Food Insecurity (10/20/2022)   Hunger Vital Sign    Worried About Running Out of Food in the Last Year: Never true    Ran Out of Food in the Last Year: Never true  Transportation Needs: No Transportation Needs (10/20/2022)   PRAPARE - Administrator, Civil Service (Medical): No    Lack of Transportation (Non-Medical): No  Physical Activity: Not on file  Stress: Not on file  Social Connections: Not on file    Social: One  of her kids died from leukemia not HCM One Daughter ((Diamond) and four sons one whom has moved here)  Family History: The patient's family history includes Alcohol abuse in her father; Hypertension in her mother; Liver disease in her mother.  ROS:   Please see the history of present illness.     EKGs/Labs/Other Studies Reviewed:    The following studies were reviewed today:   12/06/22: SR with LVH and secondary repol, anterior infarct pattern  Cardiac Studies & Procedures   CARDIAC CATHETERIZATION  CARDIAC CATHETERIZATION 03/02/2017  Narrative 1. Severe single-vessel coronary artery disease involving a large first obtuse marginal branch of the left dominant circumflex  2. Moderate LAD stenosis 3. Small nondominant RCA with no significant obstruction 4. Mild to moderate left main stenosis 5. Known hypertrophic cardiomyopathy  Recommend PCI of the obtuse marginal. 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 The patient will be loaded with Plavix. Will schedule her PCI for tomorrow. Would use femoral artery access and consider either a 5 [place] guide catheter or a guide catheter with sideholes. Plan discussed at length with the patient's daughter. Note the patient is non-English speaking.  Findings Coronary Findings Diagnostic  Dominance: Left  Left Main  Left Anterior Descending  Left Circumflex  First Obtuse Marginal Branch  Lateral First Obtuse Marginal Branch  Right Coronary Artery There is mild the vessel.  Intervention  No interventions have been documented.   CARDIAC CATHETERIZATION  CARDIAC CATHETERIZATION 03/03/2017  Narrative Images from the original result were not included.   Ost LM lesion, 40 %stenosed.  1st Mrg lesion, 90 %stenosed.  A STENT SIERRA 3.00 X 12 MM drug eluting stent was successfully placed.  Post intervention, there is a 0% residual stenosis.  Lat 1st Mrg lesion, 60 %stenosed.  Bifurcation lesion jailed by the initial stent  Post intervention - rescue PTCA only, there is a 20% residual stenosis.  Successful bifurcation PCI of large OM1 with major branch of OM1 PTCA. A Xience Sierra DES stent was used for the main branch and then PTCA was performed through the stent into the side branch for ostial stenosis.  Plan:  Transfer to Post Procedure Unit [facility]) for sheath removal  Continue dual antiplatelet therapy for one year (would be okay to stop aspirin after 3 months if necessary)  Anticipate that she should be ready for discharge tomorrow   Bryan Lemma, M.D., M.S. Interventional Cardiologist  Pager # [phone] Phone # [phone] [address] [place], [place] [address]  Findings Coronary Findings Diagnostic  Dominance: Left  Left Main Vessel is large. The lesion is calcified.  Left Anterior Descending  First Diagonal Branch Vessel is moderate in size.  First Septal Branch Vessel is moderate in size.  Second Diagonal Branch Vessel is moderate in size.  Second Septal Branch Vessel is small in size.  Third Diagonal Branch Vessel is small in size.  Third Septal Branch Vessel is small in size.  Left Circumflex  First Obtuse Marginal Branch The lesion is eccentric.  Lateral First Obtuse Marginal Branch Vessel is moderate in size. The lesion is located at the major branch and focal.  Second Obtuse Marginal Branch Vessel is moderate in size.  Third Obtuse Marginal Branch Vessel is moderate in size.  Left Posterior Descending Artery Vessel is moderate in size.  First Left Posterolateral Branch Vessel is small in size.  Right Coronary Artery Vessel was not injected. There is mild the vessel.  Intervention  1st Mrg lesion Angioplasty Lesion length: 8 mm. Lesion crossed with guidewire using a WIRE MARVEL STR TIP 190CM. Pre-stent angioplasty was performed using a BALLOON EMERGE MR 2.5X12. Maximum pressure: 12  atm. Inflation time: 20 sec. A STENT SIERRA 3.00 X 12 MM drug eluting stent was successfully placed. Minimum lumen area: 3.6 mm. Stent strut is well apposed. Tapered from 3.6 prior to the branch and 3.3 after the branch Post-stent angioplasty was performed using a BALLOON SAPPHIRE Weaver 3.5X8. Maximum pressure: 16 atm. Inflation time: 20 sec. The pre-interventional distal flow is normal (TIMI 3).  No complications occurred at this lesion. Plaque shift into the side  branch required PTCA of the side branch CATHETER LAUNCHER 6FREBU 3.5  - catheter damped with guide engagement. There is a 0% residual stenosis post intervention.  Lat 1st Mrg lesion Angioplasty Lesion length: 8 mm. Lesion crossed with guidewire using a WIRE ASAHI PROWATER 180CM. Angioplasty alone was performed using a BALLOON EMERGE MR 2.5X12. Maximum pressure: 14 atm. Inflation time: 30 sec. Initial angioplasty was done after angioplasty of the main branch revealed plaque shifting. Rescue PTCA was then performed after the stent was placed in the main branch. The pre-interventional distal flow is normal (TIMI 3).  The post-interventional distal flow is normal (TIMI 3). No complications occurred at this lesion. WIRE MARVEL STR TIP 190CM - was redirected from the main branch into the side branch through the stent for post-stent rescue PTCA of the ostial lesion There is a 20% residual stenosis post intervention.   STRESS TESTS  NM PET CT CARDIAC PERFUSION MULTI W/ABSOLUTE BLOODFLOW 05/10/2022  Narrative   Findings are consistent with no prior ischemia and no prior myocardial infarction. The study is intermediate risk due to the presence of decreased global myocardial blood flow reserve which may indicate the presence of microvascular disease, however, cannot rule out underlying obstructive CAD given the presence of extensive coronary calcification. Could consider cath for further evaluation if clinically indicated.   LV perfusion is normal.    Rest left ventricular function is normal. Rest EF: 55 %. Stress left ventricular function is normal. Stress EF: 60 %. End diastolic cavity size is normal. End systolic cavity size is normal.   Myocardial blood flow was computed to be 0.45ml/g/min at rest and 1.89ml/g/min at stress. Global myocardial blood flow reserve was 1.29 and was abnormal.   Coronary calcium was present on the attenuation correction CT images. Severe coronary calcifications were present. Coronary calcifications were present in the left anterior descending artery, left circumflex artery and right coronary artery distribution(s).  CLINICAL DATA:  This over-read does not include interpretation of cardiac or coronary anatomy or pathology. The cardiac PET CT evaluation interpretation by the cardiologist is attached.  COMPARISON:  August 01, 2021  FINDINGS: Vascular: Please see dedicated report for cardiovascular details. There is aortic atherosclerosis and coronary artery calcification.  Mediastinum/Nodes:  Patulous esophagus. Mediastinal lymphadenopathy.  (Image 13/2) 17 mm RIGHT paratracheal lymph node may measured as much as 15-16 mm in 2018. Top-normal lymph node in the AP window (image 10/2) 14 mm. 10 mm in 2023 and new since 2018.  Increase in number of smaller lymph nodes in the pre-vascular space and upper AP window. No gross hilar adenopathy.  Lungs/Pleura: Patchy basilar atelectasis, no signs of effusion or consolidation.  Upper Abdomen: No acute upper abdominal process to the extent evaluated.  Musculoskeletal: No acute or destructive bone findings.  IMPRESSION: 1. Mediastinal nodal enlargement along the RIGHT paratracheal chain similar to February of 2023 and little changed compared to the study of August of 2018. New top-normal AP window lymph node size in generalized increase in number of lymph nodes in the chest. This finding can be seen in the setting of heart failure and should be correlated  clinically. Given size of lymph nodes could consider 3 to six-month follow-up to ensure stability. 2. Aortic atherosclerosis and coronary artery calcification.  Aortic Atherosclerosis (ICD10-I70.0).   Electronically Signed By: Donzetta Kohut M.D. On: 05/10/2022 11:17   ECHOCARDIOGRAM  ECHOCARDIOGRAM LIMITED 10/18/2022  Narrative ECHOCARDIOGRAM LIMITED REPORT    Patient Name:   Dana [name] Date of Exam: 10/18/2022 Medical  Rec #:  [id number]    Height:       62.0 in Accession #:    [id number]   Weight:       154.9 lb Date of Birth:  [birth year]    BSA:          1.715 m Patient Age:    81 years     BP:           123/63 mmHg Patient Gender: F            HR:           83 bpm. Exam Location:  Inpatient  Procedure: Limited Echo, Color Doppler, Cardiac Doppler and Intracardiac Opacification Agent  Indications:    Chest Pain  History:        Patient has prior history of Echocardiogram examinations, most recent 08/30/2022. Hypertrophic Cardiomyopathy, CAD, Mitral Valve Disease, Signs/Symptoms:Chest Pain; Risk Factors:Hypertension and Dyslipidemia. Elevated BNP and Troponin.  Sonographer:    Milbert Coulter Referring Phys: [id number] LAURA R INGOLD  IMPRESSIONS   1. There is asymmetric septal hypertrophy with typical HOCM pathophysiology, but there is also apical entrapment due to mid cavity obliteration. 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 No thrombus is seen. Left ventricular ejection fraction, by estimation, is 70 to 75%. The left ventricle has hyperdynamic function. There is moderate asymmetric left ventricular hypertrophy. Left ventricular diastolic function could not be evaluated. 2. Right ventricular systolic function is normal. The right ventricular size is normal. 3. Left atrial size was moderately dilated. 4. There is severe systolic anterior motion of the mitral leaflets. There is at least moderate, probably severe mitral insufficiency due to [facility].. Severe  mitral valve regurgitation. 5. The right coronary cusp is severely calcified and immobile, but the other two cusps move well. The aortic outflow tract gradient appears to be largely due to dynamicsubvalvular obstruction due to [facility] and not due to valvular disease. Peak gradient at rest is approximately 65 mm Hg. The aortic valve is tricuspid. There is moderate calcification of the aortic valve. There is moderate thickening of the aortic valve. Aortic valve regurgitation is trivial.  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  FINDINGS Left Ventricle: There is asymmetric septal hypertrophy with typical HOCM pathophysiology, but there is also apical entrapment due to mid cavity obliteration. 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 No thrombus is seen. Left ventricular ejection fraction, by estimation, is 70 to 75%. The left ventricle has hyperdynamic function. Definity contrast agent was given IV to delineate the left ventricular endocardial borders. There is moderate asymmetric left ventricular hypertrophy. Left ventricular diastolic function could not be evaluated.  Right Ventricle: The right ventricular size is normal. No increase in right ventricular wall thickness. Right ventricular systolic function is normal.  Left Atrium: Left atrial size was moderately dilated.  Right Atrium: Right atrial size was normal in size.  Pericardium: There is no evidence of pericardial effusion.  Mitral Valve: There is severe systolic anterior motion of the mitral leaflets. There is at least moderate, probably severe mitral insufficiency due to [facility]. Mild mitral annular calcification. Severe mitral valve regurgitation, with posteriorly-directed jet.  Tricuspid Valve: The tricuspid valve is not well visualized. Tricuspid valve regurgitation is mild.  Aortic Valve: The right coronary cusp  is severely calcified and immobile, but the other two cusps move well. The aortic outflow tract gradient appears to be largely due to dynamicsubvalvular obstruction due to [facility] and not due to  valvular disease. Peak gradient at rest is approximately 65 mm Hg. The aortic valve is tricuspid. There is moderate calcification of the aortic valve. There is moderate thickening of the aortic valve. Aortic valve regurgitation is trivial.  Pulmonic Valve: The pulmonic valve was not well visualized.  Aorta: The aortic root is normal in size and structure.  IAS/Shunts: The interatrial septum was not assessed.  Additional Comments: Spectral Doppler performed. Color Doppler performed.  LEFT VENTRICLE PLAX 2D LVIDd:         4.00 cm LVIDs:         1.55 cm LV PW:         1.40 cm LV IVS:        1.80 cm LVOT diam:     1.90 cm LVOT Area:     2.84 cm   LEFT ATRIUM         Index LA diam:    3.30 cm 1.92 cm/m TRICUSPID VALVE TR Peak grad:   31.3 mmHg TR Vmax:        279.84 cm/s  SHUNTS Systemic Diam: 1.90 cm  Mihai Croitoru MD Electronically signed by Thurmon Fair MD Signature Date/Time: 10/18/2022/2:13:06 PM    Final    MONITORS  LONG TERM MONITOR (3-14 DAYS) 11/21/2021  Narrative  Patient had a minimum heart rate of 37 bpm, maximum heart rate of 160 bpm, and average heart rate of 78 bpm.  Predominant underlying rhythm was sinus rhythm.  P-SVT 10 episodes lasting 10 beats at longest with a max rate of 160 bpm at fastest.  Isolated PACs were rare (<1.0%).  Isolated PVCs were rare (<1.0%).  Two nocturnal Wenckebach episodes noted.  No triggered and diary events.  No NSVT or AF, sent for  HCM.             Recent Labs: 10/15/2022: B Natriuretic Peptide 474.0 10/16/2022: ALT 13 10/20/2022: Magnesium 1.8 10/21/2022: BUN <5; Creatinine, Ser 0.61; Hemoglobin 9.5; Platelets 262; Potassium 3.6; Sodium 135  Recent Lipid Panel    Component Value Date/Time   CHOL 84 10/21/2022 0300    TRIG 65 10/21/2022 0300   HDL 27 (L) 10/21/2022 0300   CHOLHDL 3.1 10/21/2022 0300   VLDL 13 10/21/2022 0300   LDLCALC 44 10/21/2022 0300    Physical Exam:    VS:  BP 104/70   Pulse 75   Ht 5\' 2"  (1.575 m)   Wt 157 lb (71.2 kg)   SpO2 96%   BMI 28.72 kg/m     Wt Readings from Last 3 Encounters:  12/06/22 157 lb (71.2 kg)  10/20/22 154 lb 15.7 oz (70.3 kg)  09/06/22 158 lb 12.8 oz (72 kg)    Gen: no distress, elderly female   Neck: No JV Cardiac: No Rubs or Gallops, harsh systolic murmur worse with hand grip, RRR and +2 radial pulses Respiratory: Clear to auscultation bilaterally, normal effort, normal  respiratory rate GI: Soft, nontender, non-distended  MS: no edema;  moves all extremities Integument: Skin feels ok Neuro:  At time of evaluation, alert and oriented to person/place/time/situation  Psych: Normal affect, patient feels well  ASSESSMENT:    1. Coronary artery disease of native artery of native heart with stable angina pectoris (HCC)   2. HOCM (hypertrophic obstructive cardiomyopathy) (HCC)   3. Gastrointestinal hemorrhage, unspecified gastrointestinal hemorrhage type   4. 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, preserved  EF - with associated MR but no primary MR - NYHA I on 20 mg Lasix daily and verapamil 360 mg; would not tolerate dual AV nodale therapy - she does not want SRT; when we discussed medical therapy with CMI's she is amenable if her CP or dizziness get worse - Has genetic testing evaluation is 12/20/2022 - if the apex is related to HCM or infarct; we will consider ICD; will order CMR; she may not end up wishing to get ICD; which is reasonable, but I would recommend confirming given her apex in recent echo - son and daugther are pending echo screening  CAD with recent NSTEMI GI Bleed - bleeding has resolved - has  prior PCI; recommend LHC for diagnostic purposes; likely will received medical therapy - Add anemia panel to labs  Risks and benefits of cardiac catheterization have been discussed with the patient.  These include bleeding, infection, kidney damage, stroke, heart attack, death.  The patient understands these risks and is willing to proceed.  Access recommendations: R rad Procedural considerations likely medication therapy unless significant ISR or high risk disease   Post cath with [place] APP     Time Spent Directly with Patient:   I have spent a total of 40 minutes  with the patient reviewing notes, imaging, EKGs, labs and examining the patient as well as establishing an assessment and plan that was discussed personally with the patient.  > 50% of time was spent in direct patient care and family (SIL John) .        Medication Adjustments/Labs and Tests Ordered: Current medicines are reviewed at length with the patient today.  Concerns regarding medicines are outlined above.  Orders Placed This Encounter  Procedures   CBC   Basic metabolic panel   Anemia panel   EKG 12-Lead   Meds ordered this encounter  Medications   sodium chloride flush (NS) 0.9 % injection 3 mL    Patient Instructions  Medication Instructions:  Your physician recommends that you continue on your current medications as directed. Please refer to the Current Medication list given to you today.  *If you need a refill on your cardiac medications before your next appointment, please call your pharmacy*   Lab Work: CBC, BMP, anemia panel   If you have labs (blood work) drawn today and your tests are completely normal, you will receive your results only by: MyChart Message (if you have MyChart) OR A paper copy in the mail If you have any lab test that is abnormal or we need to change your treatment, we will call you to review the results.   Testing/Procedures: Your physician has requested that you  have a cardiac catheterization. Cardiac catheterization is used to diagnose and/or treat various heart conditions. Doctors may recommend this procedure for a number of different reasons. The most common reason is to evaluate chest pain. Chest pain can be a symptom of coronary artery disease (CAD), and cardiac catheterization can show whether plaque is narrowing or blocking your heart's arteries. This procedure is also used to evaluate the valves, as well as measure the blood flow and oxygen levels in different parts of your heart. For further information please visit [email or web address] Please follow instruction sheet, as given.  WINTER: Your physician has requested that you have a cardiac MRI. Cardiac MRI uses a computer to create images of your heart as its beating, producing both still and moving pictures of your heart and major blood vessels. For further information please visit  [email or web address]. Please follow the instruction sheet given to you today for more information.    Follow-Up: At [facility], you and your health needs are our priority.  As part of our continuing mission to provide you with exceptional heart care, we have created designated Provider Care Teams.  These Care Teams include your primary Cardiologist (physician) and Advanced Practice Providers (APPs -  Physician Assistants and Nurse Practitioners) who all work together to provide you with the care you need, when you need it.  We recommend signing up for the patient portal called "MyChart".  Sign up information is provided on this After Visit Summary.  MyChart is used to connect with patients for Virtual Visits (Telemedicine).  Patients are able to view lab/test results, encounter notes, upcoming appointments, etc.  Non-urgent messages can be sent to your provider as well.   To learn more about what you can do with MyChart, go to [email or web address].    Your next appointment:   1-2 month(s)  Provider:   Randall An  Then, Riley Lam, MD will plan to see you again in 7 month(s).    Other Instructions   You are scheduled for a Cardiac Catheterization on Thursday, June 13 with Dr. Alverda Skeans.  1. Please arrive at the [facility] (Main Entrance A) at [facility]: [address] [place] [address], [place] [address] at 7:00 AM (This time is 2 hour(s) before your procedure to ensure your preparation). Free valet parking service is available. You will check in at ADMITTING. The support person will be asked to wait in the waiting room.  It is OK to have someone drop you off and come back when you are ready to be discharged.    Special note: Every effort is made to have your procedure done on time. Please understand that emergencies sometimes delay scheduled procedures.  2. Diet: Do not eat solid foods after midnight.  The patient may have clear liquids until 5am upon the day of the procedure.  3. Labs: You will need to have blood drawn on TODAY.  4. Medication instructions in preparation for your procedure:   Contrast Allergy: No  DO NOT TAKE: furosemide (Lasix) the morning of procedure.  On the morning of your procedure, take your Aspirin 81 mg and any morning medicines NOT listed above.  You may use sips of water.  5. Plan to go home the same day, you will only stay overnight if medically necessary. 6. Bring a current list of your medications and current insurance cards. 7. You MUST have a responsible person to drive you home. 8. Someone MUST be with you the first 24 hours after you arrive home or your discharge will be delayed. 9. Please wear clothes that are easy to get on and off and wear slip-on shoes.  Thank you for allowing [place] to care for you!   -- [facility]       You are scheduled for Cardiac MRI on ______________. Please arrive for your appointment at ______________ ( arrive 30-45 minutes prior to test start time). ?  [facility] [address] [place], [place] [address] [phone] Please take advantage of the free valet parking available at the MAIN entrance (A entrance).  Proceed to the [facility] Radiology Department (First Floor) for check-in.   OR   [facility] [address] [place], [place] [address] [phone] Please take advantage of the free valet parking available at the  MAIN entrance. Proceed to [facility] registration for check-in (first floor).  Magnetic resonance imaging (MRI) is a painless test that produces images of the inside of the body without using Xrays.  During an MRI, strong magnets and radio waves work together in a Data processing manager to form detailed images.   MRI images may provide more details about a medical condition than X-rays, CT scans, and ultrasounds can provide.  You may be given earphones to listen for instructions.  You may eat a light breakfast and take medications as ordered with the exception of furosemide, hydrochlorothiazide, or spironolactone(fluid pill, other). Please avoid stimulants for 12 hr prior to test. (Ie. Caffeine, nicotine, chocolate, or antihistamine medications)  If a contrast material will be used, an IV will be inserted into one of your veins. Contrast material will be injected into your IV. It will leave your body through your urine within a day. You may be told to drink plenty of fluids to help flush the contrast material out of your system.  You will be asked to remove all metal, including: Watch, jewelry, and other metal objects including hearing aids, hair pieces and dentures. Also wearable glucose monitoring systems (ie. Freestyle Libre and Omnipods) (Braces and fillings normally are not a problem.)   TEST WILL TAKE APPROXIMATELY 1 HOUR  PLEASE NOTIFY SCHEDULING AT LEAST 24 HOURS IN ADVANCE IF YOU ARE UNABLE TO KEEP YOUR APPOINTMENT. [phone]  Please call Rockwell Alexandria, cardiac imaging nurse navigator  with any questions/concerns. Rockwell Alexandria RN Navigator Cardiac Imaging Larey Brick RN Navigator Cardiac Imaging [facility] Heart and Vascular Services [phone] Office     Signed, Christell Constant, MD  12/06/2022 10:43 AM    [facility]

## 2022-12-06 NOTE — Patient Instructions (Signed)
Medication Instructions:  Your physician recommends that you continue on your current medications as directed. Please refer to the Current Medication list given to you today.  *If you need a refill on your cardiac medications before your next appointment, please call your pharmacy*   Lab Work: CBC, BMP, anemia panel   If you have labs (blood work) drawn today and your tests are completely normal, you will receive your results only by: MyChart Message (if you have MyChart) OR A paper copy in the mail If you have any lab test that is abnormal or we need to change your treatment, we will call you to review the results.   Testing/Procedures: Your physician has requested that you have a cardiac catheterization. Cardiac catheterization is used to diagnose and/or treat various heart conditions. Doctors may recommend this procedure for a number of different reasons. The most common reason is to evaluate chest pain. Chest pain can be a symptom of coronary artery disease (CAD), and cardiac catheterization can show whether plaque is narrowing or blocking your heart's arteries. This procedure is also used to evaluate the valves, as well as measure the blood flow and oxygen levels in different parts of your heart. For further information please visit [email or web address] Please follow instruction sheet, as given.  WINTER: Your physician has requested that you have a cardiac MRI. Cardiac MRI uses a computer to create images of your heart as its beating, producing both still and moving pictures of your heart and major blood vessels. For further information please visit [email or web address]. Please follow the instruction sheet given to you today for more information.    Follow-Up: At [facility], you and your health needs are our priority.  As part of our continuing mission to provide you with exceptional heart care, we have created designated Provider Care Teams.  These Care Teams include your primary  Cardiologist (physician) and Advanced Practice Providers (APPs -  Physician Assistants and Nurse Practitioners) who all work together to provide you with the care you need, when you need it.  We recommend signing up for the patient portal called "MyChart".  Sign up information is provided on this After Visit Summary.  MyChart is used to connect with patients for Virtual Visits (Telemedicine).  Patients are able to view lab/test results, encounter notes, upcoming appointments, etc.  Non-urgent messages can be sent to your provider as well.   To learn more about what you can do with MyChart, go to [email or web address].    Your next appointment:   1-2 month(s)  Provider:   Randall An  Then, Riley Lam, MD will plan to see you again in 7 month(s).    Other Instructions   You are scheduled for a Cardiac Catheterization on Thursday, June 13 with Dr. Alverda Skeans.  1. Please arrive at the [facility] (Main Entrance A) at [facility]: [address] [place], [place] [address] at 7:00 AM (This time is 2 hour(s) before your procedure to ensure your preparation). Free valet parking service is available. You will check in at ADMITTING. The support person will be asked to wait in the waiting room.  It is OK to have someone drop you off and come back when you are ready to be discharged.    Special note: Every effort is made to have your procedure done on time. Please understand that emergencies sometimes delay scheduled procedures.  2. Diet: Do not eat solid foods after midnight.  The patient may have clear liquids until 5am upon  the day of the procedure.  3. Labs: You will need to have blood drawn on TODAY.  4. Medication instructions in preparation for your procedure:   Contrast Allergy: No  DO NOT TAKE: furosemide (Lasix) the morning of procedure.  On the morning of your procedure, take your Aspirin 81 mg and any morning medicines NOT listed above.  You may use sips  of water.  5. Plan to go home the same day, you will only stay overnight if medically necessary. 6. Bring a current list of your medications and current insurance cards. 7. You MUST have a responsible person to drive you home. 8. Someone MUST be with you the first 24 hours after you arrive home or your discharge will be delayed. 9. Please wear clothes that are easy to get on and off and wear slip-on shoes.  Thank you for allowing [place] to care for you!   -- [place] [facility]       You are scheduled for Cardiac MRI on ______________. Please arrive for your appointment at ______________ ( arrive 30-45 minutes prior to test start time). ?  [facility] [place] [address] [place], [place] [address] [phone] Please take advantage of the free valet parking available at the MAIN entrance (A entrance).  Proceed to the [facility] Radiology Department (First Floor) for check-in.   OR   [facility] [address] [place], [place] [address] [phone] Please take advantage of the free valet parking available at the MAIN entrance. Proceed to [facility] registration for check-in (first floor).  Magnetic resonance imaging (MRI) is a painless test that produces images of the inside of the body without using Xrays.  During an MRI, strong magnets and radio waves work together in a Data processing manager to form detailed images.   MRI images may provide more details about a medical condition than X-rays, CT scans, and ultrasounds can provide.  You may be given earphones to listen for instructions.  You may eat a light breakfast and take medications as ordered with the exception of furosemide, hydrochlorothiazide, or spironolactone(fluid pill, other). Please avoid stimulants for 12 hr prior to test. (Ie. Caffeine, nicotine, chocolate, or antihistamine medications)  If a contrast material will be used, an IV will be inserted into one of your  veins. Contrast material will be injected into your IV. It will leave your body through your urine within a day. You may be told to drink plenty of fluids to help flush the contrast material out of your system.  You will be asked to remove all metal, including: Watch, jewelry, and other metal objects including hearing aids, hair pieces and dentures. Also wearable glucose monitoring systems (ie. Freestyle Libre and Omnipods) (Braces and fillings normally are not a problem.)   TEST WILL TAKE APPROXIMATELY 1 HOUR  PLEASE NOTIFY SCHEDULING AT LEAST 24 HOURS IN ADVANCE IF YOU ARE UNABLE TO KEEP YOUR APPOINTMENT. [phone]  Please call Rockwell Alexandria, cardiac imaging nurse navigator with any questions/concerns. Rockwell Alexandria RN Navigator Cardiac Imaging Larey Brick RN Navigator Cardiac Imaging [facility] Heart and Vascular Services [phone] Office

## 2022-12-06 NOTE — H&P (View-Only) (Signed)
Cardiology Office Note:    Date:  12/06/2022   ID:  Dana [name], DOB 07/27/1940, MRN [id number]  PCP:  Kim, James, MD   [facility] Providers Cardiologist:  Samuel McDowell, MD     Referring MD: Kim, James, MD   CC:  Follow up oHCM  History of Present Illness:    Dana [name] is a 82 y.o. female with a hx of CAD s/p PCI OM1 and HOCM with preserved LVEF and LVOT gradient of 100 mmHg.  She was unable to do stress test due to severe gradient.  She is back from her trip. 2023: started AV nodal therapy with improvement-> now no symptoms, pending genetics follow up.  Was seen in Twice in [place]: once for chest pain and once for shortness of breath requiring increased diuretic. 2024: NSTEMI eval related to GI bleed  Patient notes no SOB at rest and no DOE since leaving the hospital.  Notes no fatigue. No further GI bleeding Notes no palpitations Notes no CP. Notes no Dizziness. Notes no syncope.  Dana [name] is not at the visit today; her son in low and her other son who recently moved here is present.  Interpretor service.    Past Medical History:  Diagnosis Date   CAD (coronary artery disease)    a. s/p DES to OM1 in 02/2017   Chest pain    Essential hypertension    Gout    HOCM (hypertrophic obstructive cardiomyopathy) (HCC)    Hyperlipidemia    Rheumatoid arthritis (HCC)     Past Surgical History:  Procedure Laterality Date   BIOPSY  10/20/2022   Procedure: BIOPSY;  Surgeon: Beavers, Kimberly, MD;  [facility]: [facility];  Service: Gastroenterology;;   CHOLECYSTECTOMY N/A 08/17/2018   Procedure: LAPAROSCOPIC CHOLECYSTECTOMY;  Surgeon: Jenkins, Mark, MD;  Location: [facility];  Service: General;  Laterality: N/A;   COLONOSCOPY WITH PROPOFOL N/A 10/20/2022   Procedure: COLONOSCOPY WITH PROPOFOL;  Surgeon: Beavers, Kimberly, MD;  Location: [facility];  Service: Gastroenterology;  Laterality: N/A;   CORONARY BALLOON ANGIOPLASTY N/A 03/03/2017   Procedure: CORONARY BALLOON  ANGIOPLASTY;  Surgeon: Harding, David W, MD;  [facility]: [facility];  Service: Cardiovascular;  Laterality: N/A;   CORONARY STENT INTERVENTION N/A 03/03/2017   Procedure: CORONARY STENT INTERVENTION;  Surgeon: Harding, David W, MD;  [facility];  Service: Cardiovascular;  Laterality: N/A;   ESOPHAGOGASTRODUODENOSCOPY (EGD) WITH PROPOFOL N/A 10/20/2022   Procedure: ESOPHAGOGASTRODUODENOSCOPY (EGD) WITH PROPOFOL;  Surgeon: Beavers, Kimberly, MD;  [facility]: [facility];  Service: Gastroenterology;  Laterality: N/A;   LEFT HEART CATH AND CORONARY ANGIOGRAPHY N/A 03/02/2017   Procedure: LEFT HEART CATH AND CORONARY ANGIOGRAPHY;  Surgeon: Cooper, Michael, MD;  [facility];  Service: Cardiovascular;  Laterality: N/A;   POLYPECTOMY  10/20/2022   Procedure: POLYPECTOMY;  Surgeon: Beavers, Kimberly, MD;  Location: [facility];  Service: Gastroenterology;;    Current Medications: Current Meds  Medication Sig   acetaminophen (TYLENOL) 325 MG tablet Take 2 tablets (650 mg total) by mouth every 6 (six) hours as needed for mild pain, fever or headache (or Fever >/= 101).   alendronate (FOSAMAX) 70 MG tablet Take 70 mg by mouth every Wednesday.   ALLERGY RELIEF 10 MG tablet Take 10 mg by mouth daily.   allopurinol (ZYLOPRIM) 300 MG tablet Take 300 mg by mouth daily.   aspirin EC 81 MG tablet Take 1 tablet (81 mg total) by mouth daily with breakfast.   atorvastatin (LIPITOR) 10 MG   tablet Take 10 mg by mouth daily.   ezetimibe (ZETIA) 10 MG tablet Take 10 mg by mouth daily.   famotidine (PEPCID) 20 MG tablet Take 1 tablet (20 mg total) by mouth 2 (two) times daily.   ferrous sulfate 325 (65 FE) MG tablet Take 325 mg by mouth daily with breakfast.   folic acid (FOLVITE) 1 MG tablet Take 1 mg by mouth daily.   furosemide (LASIX) 20 MG tablet Take 10 mg by mouth daily.   methotrexate 2.5 MG tablet Take 20 mg by mouth every Wednesday.    nitroGLYCERIN (NITROSTAT) 0.4 MG SL  tablet PLACE 1 TABLET UNDER THE TONGUE EVERY 5 MINUTES AS NEEDED FOR CHEST PAIN (Patient taking differently: Place 0.4 mg under the tongue every 5 (five) minutes as needed for chest pain.)   pantoprazole (PROTONIX) 40 MG tablet Take 1 tablet (40 mg total) by mouth daily.   RESTASIS 0.05 % ophthalmic emulsion 1 drop 2 (two) times daily.   sucralfate (CARAFATE) 1 g tablet Take 1 g by mouth 2 (two) times daily.   verapamil (VERELAN PM) 360 MG 24 hr capsule TAKE (1) CAPSULE BY MOUTH AT BEDTIME.   Vitamin D, Cholecalciferol, 25 MCG (1000 UT) TABS Take 1 tablet by mouth daily.   [DISCONTINUED] folic acid (FOLVITE) 1 MG tablet    Current Facility-Administered Medications for the 12/06/22 encounter (Office Visit) with Carlus Stay A, MD  Medication   sodium chloride flush (NS) 0.9 % injection 3 mL     Allergies:   Patient has no known allergies.   Social History   Socioeconomic History   Marital status: Widowed    Spouse name: Not on file   Number of children: Not on file   Years of education: Not on file   Highest education level: Not on file  Occupational History   Not on file  Tobacco Use   Smoking status: Never   Smokeless tobacco: Never  Vaping Use   Vaping Use: Never used  Substance and Sexual Activity   Alcohol use: No   Drug use: No   Sexual activity: Not on file  Other Topics Concern   Not on file  Social History Narrative   Not on file   Social Determinants of Health   Financial Resource Strain: Not on file  Food Insecurity: No Food Insecurity (10/20/2022)   Hunger Vital Sign    Worried About Running Out of Food in the Last Year: Never true    Ran Out of Food in the Last Year: Never true  Transportation Needs: No Transportation Needs (10/20/2022)   PRAPARE - Transportation    Lack of Transportation (Medical): No    Lack of Transportation (Non-Medical): No  Physical Activity: Not on file  Stress: Not on file  Social Connections: Not on file    Social: One  of her kids died from leukemia not HCM One Daughter ((Dana [name]) and four sons one whom has moved here)  Family History: The patient's family history includes Alcohol abuse in her father; Hypertension in her mother; Liver disease in her mother.  ROS:   Please see the history of present illness.     EKGs/Labs/Other Studies Reviewed:    The following studies were reviewed today:   12/06/22: SR with LVH and secondary repol, anterior infarct pattern  Cardiac Studies & Procedures   CARDIAC CATHETERIZATION  CARDIAC CATHETERIZATION 03/02/2017  Narrative 1. Severe single-vessel coronary artery disease involving a large first obtuse marginal branch of the left dominant circumflex   2. Moderate LAD stenosis 3. Small nondominant RCA with no significant obstruction 4. Mild to moderate left main stenosis 5. Known hypertrophic cardiomyopathy  Recommend PCI of the obtuse marginal. 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 The patient will be loaded with Plavix. Will schedule her PCI for tomorrow. Would use femoral artery access and consider either a 5 French guide catheter or a guide catheter with sideholes. Plan discussed at length with the patient's daughter. Note the patient is non-English speaking.  Findings Coronary Findings Diagnostic  Dominance: Left  Left Main  Left Anterior Descending  Left Circumflex  First Obtuse Marginal Branch  Lateral First Obtuse Marginal Branch  Right Coronary Artery There is mild the vessel.  Intervention  No interventions have been documented.   CARDIAC CATHETERIZATION  CARDIAC CATHETERIZATION 03/03/2017  Narrative Images from the original result were not included.   Ost LM lesion, 40 %stenosed.  1st Mrg lesion, 90 %stenosed.  A STENT SIERRA 3.00 X 12 MM drug eluting stent was successfully placed.  Post intervention, there is a 0% residual stenosis.  Lat 1st Mrg lesion, 60 %stenosed.  Bifurcation lesion jailed by the initial stent  Post intervention - rescue PTCA only, there is a 20% residual stenosis.  Successful bifurcation PCI of large OM1 with major branch of OM1 PTCA. A Xience Sierra DES stent was used for the main branch and then PTCA was performed through the stent into the side branch for ostial stenosis.  Plan:  Transfer to Post Procedure Unit (6C) for sheath removal  Continue dual antiplatelet therapy for one year (would be okay to stop aspirin after 3 months if necessary)  Anticipate that she should be ready for discharge tomorrow   David Harding, M.D., M.S. Interventional Cardiologist  Pager # [phone] Phone # [phone] [address] [place], [place] [address]  Findings Coronary Findings Diagnostic  Dominance: Left  Left Main Vessel is large. The lesion is calcified.  Left Anterior Descending  First Diagonal Branch Vessel is moderate in size.  First Septal Branch Vessel is moderate in size.  Second Diagonal Branch Vessel is moderate in size.  Second Septal Branch Vessel is small in size.  Third Diagonal Branch Vessel is small in size.  Third Septal Branch Vessel is small in size.  Left Circumflex  First Obtuse Marginal Branch The lesion is eccentric.  Lateral First Obtuse Marginal Branch Vessel is moderate in size. The lesion is located at the major branch and focal.  Second Obtuse Marginal Branch Vessel is moderate in size.  Third Obtuse Marginal Branch Vessel is moderate in size.  Left Posterior Descending Artery Vessel is moderate in size.  First Left Posterolateral Branch Vessel is small in size.  Right Coronary Artery Vessel was not injected. There is mild the vessel.  Intervention  1st Mrg lesion Angioplasty Lesion length: 8 mm. Lesion crossed with guidewire using a WIRE MARVEL STR TIP 190CM. Pre-stent angioplasty was performed using a BALLOON EMERGE MR 2.5X12. Maximum pressure: 12  atm. Inflation time: 20 sec. A STENT SIERRA 3.00 X 12 MM drug eluting stent was successfully placed. Minimum lumen area: 3.6 mm. Stent strut is well apposed. Tapered from 3.6 prior to the branch and 3.3 after the branch Post-stent angioplasty was performed using a BALLOON SAPPHIRE Healy 3.5X8. Maximum pressure: 16 atm. Inflation time: 20 sec. The pre-interventional distal flow is normal (TIMI 3).  No complications occurred at this lesion. Plaque shift into the side   branch required PTCA of the side branch CATHETER LAUNCHER 6FREBU 3.5  - catheter damped with guide engagement. There is a 0% residual stenosis post intervention.  Lat 1st Mrg lesion Angioplasty Lesion length: 8 mm. Lesion crossed with guidewire using a WIRE ASAHI PROWATER 180CM. Angioplasty alone was performed using a BALLOON EMERGE MR 2.5X12. Maximum pressure: 14 atm. Inflation time: 30 sec. Initial angioplasty was done after angioplasty of the main branch revealed plaque shifting. Rescue PTCA was then performed after the stent was placed in the main branch. The pre-interventional distal flow is normal (TIMI 3).  The post-interventional distal flow is normal (TIMI 3). No complications occurred at this lesion. WIRE MARVEL STR TIP 190CM - was redirected from the main branch into the side branch through the stent for post-stent rescue PTCA of the ostial lesion There is a 20% residual stenosis post intervention.   STRESS TESTS  NM PET CT CARDIAC PERFUSION MULTI W/ABSOLUTE BLOODFLOW 05/10/2022  Narrative   Findings are consistent with no prior ischemia and no prior myocardial infarction. The study is intermediate risk due to the presence of decreased global myocardial blood flow reserve which may indicate the presence of microvascular disease, however, cannot rule out underlying obstructive CAD given the presence of extensive coronary calcification. Could consider cath for further evaluation if clinically indicated.   LV perfusion is normal.    Rest left ventricular function is normal. Rest EF: 55 %. Stress left ventricular function is normal. Stress EF: 60 %. End diastolic cavity size is normal. End systolic cavity size is normal.   Myocardial blood flow was computed to be 0.84ml/g/min at rest and 1.08ml/g/min at stress. Global myocardial blood flow reserve was 1.29 and was abnormal.   Coronary calcium was present on the attenuation correction CT images. Severe coronary calcifications were present. Coronary calcifications were present in the left anterior descending artery, left circumflex artery and right coronary artery distribution(s).  CLINICAL DATA:  This over-read does not include interpretation of cardiac or coronary anatomy or pathology. The cardiac PET CT evaluation interpretation by the cardiologist is attached.  COMPARISON:  August 01, 2021  FINDINGS: Vascular: Please see dedicated report for cardiovascular details. There is aortic atherosclerosis and coronary artery calcification.  Mediastinum/Nodes:  Patulous esophagus. Mediastinal lymphadenopathy.  (Image 13/2) 17 mm RIGHT paratracheal lymph node may measured as much as 15-16 mm in 2018. Top-normal lymph node in the AP window (image 10/2) 14 mm. 10 mm in 2023 and new since 2018.  Increase in number of smaller lymph nodes in the pre-vascular space and upper AP window. No gross hilar adenopathy.  Lungs/Pleura: Patchy basilar atelectasis, no signs of effusion or consolidation.  Upper Abdomen: No acute upper abdominal process to the extent evaluated.  Musculoskeletal: No acute or destructive bone findings.  IMPRESSION: 1. Mediastinal nodal enlargement along the RIGHT paratracheal chain similar to February of 2023 and little changed compared to the study of August of 2018. New top-normal AP window lymph node size in generalized increase in number of lymph nodes in the chest. This finding can be seen in the setting of heart failure and should be correlated  clinically. Given size of lymph nodes could consider 3 to six-month follow-up to ensure stability. 2. Aortic atherosclerosis and coronary artery calcification.  Aortic Atherosclerosis (ICD10-I70.0).   Electronically Signed By: Geoffrey  Wile M.D. On: 05/10/2022 11:17   ECHOCARDIOGRAM  ECHOCARDIOGRAM LIMITED 10/18/2022  Narrative ECHOCARDIOGRAM LIMITED REPORT    Patient Name:   Kenyada Naves Date of Exam: 10/18/2022 Medical   Rec #:  [id number]    Height:       62.0 in Accession #:    [id number]   Weight:       154.9 lb Date of Birth:  01/24/1941    BSA:          1.715 m Patient Age:    81 years     BP:           123/63 mmHg Patient Gender: F            HR:           83 bpm. Exam Location:  Inpatient  Procedure: Limited Echo, Color Doppler, Cardiac Doppler and Intracardiac Opacification Agent  Indications:    Chest Pain  History:        Patient has prior history of Echocardiogram examinations, most recent 08/30/2022. Hypertrophic Cardiomyopathy, CAD, Mitral Valve Disease, Signs/Symptoms:Chest Pain; Risk Factors:Hypertension and Dyslipidemia. Elevated BNP and Troponin.  Sonographer:    Melissa Kafa Referring Phys: [id number] LAURA R INGOLD  IMPRESSIONS   1. There is asymmetric septal hypertrophy with typical HOCM pathophysiology, but there is also apical entrapment due to mid cavity obliteration. 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 No thrombus is seen. Left ventricular ejection fraction, by estimation, is 70 to 75%. The left ventricle has hyperdynamic function. There is moderate asymmetric left ventricular hypertrophy. Left ventricular diastolic function could not be evaluated. 2. Right ventricular systolic function is normal. The right ventricular size is normal. 3. Left atrial size was moderately dilated. 4. There is severe systolic anterior motion of the mitral leaflets. 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 Severe  mitral valve regurgitation. 5. The right coronary cusp is severely calcified and immobile, but the other two cusps move well. The aortic outflow tract gradient appears to be largely due to dynamicsubvalvular obstruction due to SAM and not due to valvular disease. Peak gradient at rest is approximately 65 mm Hg. The aortic valve is tricuspid. There is moderate calcification of the aortic valve. There is moderate thickening of the aortic valve. Aortic valve regurgitation is trivial.  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  FINDINGS Left Ventricle: There is asymmetric septal hypertrophy with typical HOCM pathophysiology, but there is also apical entrapment due to mid cavity obliteration. 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 No thrombus is seen. Left ventricular ejection fraction, by estimation, is 70 to 75%. The left ventricle has hyperdynamic function. Definity contrast agent was given IV to delineate the left ventricular endocardial borders. There is moderate asymmetric left ventricular hypertrophy. Left ventricular diastolic function could not be evaluated.  Right Ventricle: The right ventricular size is normal. No increase in right ventricular wall thickness. Right ventricular systolic function is normal.  Left Atrium: Left atrial size was moderately dilated.  Right Atrium: Right atrial size was normal in size.  Pericardium: There is no evidence of pericardial effusion.  Mitral Valve: There is severe systolic anterior motion of the mitral leaflets. 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 Mild mitral annular calcification. Severe mitral valve regurgitation, with posteriorly-directed jet.  Tricuspid Valve: The tricuspid valve is not well visualized. Tricuspid valve regurgitation is mild.  Aortic Valve: The right coronary cusp  is severely calcified and immobile, but the other two cusps move well. The aortic outflow tract gradient appears to be largely due to dynamicsubvalvular obstruction due to SAM and not due to   valvular disease. Peak gradient at rest is approximately 65 mm Hg. The aortic valve is tricuspid. There is moderate calcification of the aortic valve. There is moderate thickening of the aortic valve. Aortic valve regurgitation is trivial.  Pulmonic Valve: The pulmonic valve was not well visualized.  Aorta: The aortic root is normal in size and structure.  IAS/Shunts: The interatrial septum was not assessed.  Additional Comments: Spectral Doppler performed. Color Doppler performed.  LEFT VENTRICLE PLAX 2D LVIDd:         4.00 cm LVIDs:         1.55 cm LV PW:         1.40 cm LV IVS:        1.80 cm LVOT diam:     1.90 cm LVOT Area:     2.84 cm   LEFT ATRIUM         Index LA diam:    3.30 cm 1.92 cm/m TRICUSPID VALVE TR Peak grad:   31.3 mmHg TR Vmax:        279.84 cm/s  SHUNTS Systemic Diam: 1.90 cm  Mihai Croitoru MD Electronically signed by Mihai Croitoru MD Signature Date/Time: 10/18/2022/2:13:06 PM    Final    MONITORS  LONG TERM MONITOR (3-14 DAYS) 11/21/2021  Narrative  Patient had a minimum heart rate of 37 bpm, maximum heart rate of 160 bpm, and average heart rate of 78 bpm.  Predominant underlying rhythm was sinus rhythm.  P-SVT 10 episodes lasting 10 beats at longest with a max rate of 160 bpm at fastest.  Isolated PACs were rare (<1.0%).  Isolated PVCs were rare (<1.0%).  Two nocturnal Wenckebach episodes noted.  No triggered and diary events.  No NSVT or AF, sent for  HCM.             Recent Labs: 10/15/2022: B Natriuretic Peptide 474.0 10/16/2022: ALT 13 10/20/2022: Magnesium 1.8 10/21/2022: BUN <5; Creatinine, Ser 0.61; Hemoglobin 9.5; Platelets 262; Potassium 3.6; Sodium 135  Recent Lipid Panel    Component Value Date/Time   CHOL 84 10/21/2022 0300    TRIG 65 10/21/2022 0300   HDL 27 (L) 10/21/2022 0300   CHOLHDL 3.1 10/21/2022 0300   VLDL 13 10/21/2022 0300   LDLCALC 44 10/21/2022 0300    Physical Exam:    VS:  BP 104/70   Pulse 75   Ht 5' 2" (1.575 m)   Wt 157 lb (71.2 kg)   SpO2 96%   BMI 28.72 kg/m     Wt Readings from Last 3 Encounters:  12/06/22 157 lb (71.2 kg)  10/20/22 154 lb 15.7 oz (70.3 kg)  09/06/22 158 lb 12.8 oz (72 kg)    Gen: no distress, elderly female   Neck: No JV Cardiac: No Rubs or Gallops, harsh systolic murmur worse with hand grip, RRR and +2 radial pulses Respiratory: Clear to auscultation bilaterally, normal effort, normal  respiratory rate GI: Soft, nontender, non-distended  MS: no edema;  moves all extremities Integument: Skin feels ok Neuro:  At time of evaluation, alert and oriented to person/place/time/situation  Psych: Normal affect, patient feels well  ASSESSMENT:    1. Coronary artery disease of native artery of native heart with stable angina pectoris (HCC)   2. HOCM (hypertrophic obstructive cardiomyopathy) (HCC)   3. Gastrointestinal hemorrhage, unspecified gastrointestinal hemorrhage type   4. 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, preserved   EF - with associated MR but no primary MR - NYHA I on 20 mg Lasix daily and verapamil 360 mg; would not tolerate dual AV nodale therapy - she does not want SRT; when we discussed medical therapy with CMI's she is amenable if her CP or dizziness get worse - Has genetic testing evaluation is 12/20/2022 - if the apex is related to HCM or infarct; we will consider ICD; will order CMR; she may not end up wishing to get ICD; which is reasonable, but I would recommend confirming given her apex in recent echo - son and daugther are pending echo screening  CAD with recent NSTEMI GI Bleed - bleeding has resolved - has  prior PCI; recommend LHC for diagnostic purposes; likely will received medical therapy - Add anemia panel to labs  Risks and benefits of cardiac catheterization have been discussed with the patient.  These include bleeding, infection, kidney damage, stroke, heart attack, death.  The patient understands these risks and is willing to proceed.  Access recommendations: R rad Procedural considerations likely medication therapy unless significant ISR or high risk disease   Post cath with [place] APP     Time Spent Directly with Patient:   I have spent a total of 40 minutes  with the patient reviewing notes, imaging, EKGs, labs and examining the patient as well as establishing an assessment and plan that was discussed personally with the patient.  > 50% of time was spent in direct patient care and family (SIL John) .        Medication Adjustments/Labs and Tests Ordered: Current medicines are reviewed at length with the patient today.  Concerns regarding medicines are outlined above.  Orders Placed This Encounter  Procedures   CBC   Basic metabolic panel   Anemia panel   EKG 12-Lead   Meds ordered this encounter  Medications   sodium chloride flush (NS) 0.9 % injection 3 mL    Patient Instructions  Medication Instructions:  Your physician recommends that you continue on your current medications as directed. Please refer to the Current Medication list given to you today.  *If you need a refill on your cardiac medications before your next appointment, please call your pharmacy*   Lab Work: CBC, BMP, anemia panel   If you have labs (blood work) drawn today and your tests are completely normal, you will receive your results only by: MyChart Message (if you have MyChart) OR A paper copy in the mail If you have any lab test that is abnormal or we need to change your treatment, we will call you to review the results.   Testing/Procedures: Your physician has requested that you  have a cardiac catheterization. Cardiac catheterization is used to diagnose and/or treat various heart conditions. Doctors may recommend this procedure for a number of different reasons. The most common reason is to evaluate chest pain. Chest pain can be a symptom of coronary artery disease (CAD), and cardiac catheterization can show whether plaque is narrowing or blocking your heart's arteries. This procedure is also used to evaluate the valves, as well as measure the blood flow and oxygen levels in different parts of your heart. For further information please visit [email or web address]. Please follow instruction sheet, as given.  WINTER: Your physician has requested that you have a cardiac MRI. Cardiac MRI uses a computer to create images of your heart as its beating, producing both still and moving pictures of your heart and major blood vessels. For further information please visit   [email or web address]. Please follow the instruction sheet given to you today for more information.    Follow-Up: At [facility], you and your health needs are our priority.  As part of our continuing mission to provide you with exceptional heart care, we have created designated Provider Care Teams.  These Care Teams include your primary Cardiologist (physician) and Advanced Practice Providers (APPs -  Physician Assistants and Nurse Practitioners) who all work together to provide you with the care you need, when you need it.  We recommend signing up for the patient portal called "MyChart".  Sign up information is provided on this After Visit Summary.  MyChart is used to connect with patients for Virtual Visits (Telemedicine).  Patients are able to view lab/test results, encounter notes, upcoming appointments, etc.  Non-urgent messages can be sent to your provider as well.   To learn more about what you can do with MyChart, go to [email or web address].    Your next appointment:   1-2 month(s)  Provider:   Brittany  Strader  Then, Aloma Boch, MD will plan to see you again in 7 month(s).    Other Instructions   You are scheduled for a Cardiac Catheterization on Thursday, June 13 with Dr. Arun Thukkani.  1. Please arrive at the North Tower (Main Entrance A) at [facility]: [address] [place], [address] [address] at 7:00 AM (This time is 2 hour(s) before your procedure to ensure your preparation). Free valet parking service is available. You will check in at ADMITTING. The support person will be asked to wait in the waiting room.  It is OK to have someone drop you off and come back when you are ready to be discharged.    Special note: Every effort is made to have your procedure done on time. Please understand that emergencies sometimes delay scheduled procedures.  2. Diet: Do not eat solid foods after midnight.  The patient may have clear liquids until 5am upon the day of the procedure.  3. Labs: You will need to have blood drawn on TODAY.  4. Medication instructions in preparation for your procedure:   Contrast Allergy: No  DO NOT TAKE: furosemide (Lasix) the morning of procedure.  On the morning of your procedure, take your Aspirin 81 mg and any morning medicines NOT listed above.  You may use sips of water.  5. Plan to go home the same day, you will only stay overnight if medically necessary. 6. Bring a current list of your medications and current insurance cards. 7. You MUST have a responsible person to drive you home. 8. Someone MUST be with you the first 24 hours after you arrive home or your discharge will be delayed. 9. Please wear clothes that are easy to get on and off and wear slip-on shoes.  Thank you for allowing us to care for you!   -- [facility]       You are scheduled for Cardiac MRI on ______________. Please arrive for your appointment at ______________ ( arrive 30-45 minutes prior to test start time). ?  [facility] [address] [place], [place] [address] [phone] Please take advantage of the free valet parking available at the MAIN entrance (A entrance).  Proceed to the [place] Radiology Department (First Floor) for check-in.   OR   [facility] [address] [place], [place] [address] [phone] Please take advantage of the free valet parking available at the   MAIN entrance. Proceed to [facility] registration for check-in (first floor).  Magnetic resonance imaging (MRI) is a painless test that produces images of the inside of the body without using Xrays.  During an MRI, strong magnets and radio waves work together in a magnetic field to form detailed images.   MRI images may provide more details about a medical condition than X-rays, CT scans, and ultrasounds can provide.  You may be given earphones to listen for instructions.  You may eat a light breakfast and take medications as ordered with the exception of furosemide, hydrochlorothiazide, or spironolactone(fluid pill, other). Please avoid stimulants for 12 hr prior to test. (Ie. Caffeine, nicotine, chocolate, or antihistamine medications)  If a contrast material will be used, an IV will be inserted into one of your veins. Contrast material will be injected into your IV. It will leave your body through your urine within a day. You may be told to drink plenty of fluids to help flush the contrast material out of your system.  You will be asked to remove all metal, including: Watch, jewelry, and other metal objects including hearing aids, hair pieces and dentures. Also wearable glucose monitoring systems (ie. Freestyle Libre and Omnipods) (Braces and fillings normally are not a problem.)   TEST WILL TAKE APPROXIMATELY 1 HOUR  PLEASE NOTIFY SCHEDULING AT LEAST 24 HOURS IN ADVANCE IF YOU ARE UNABLE TO KEEP YOUR APPOINTMENT. [phone]  Please call Sara Wallace, cardiac imaging nurse navigator  with any questions/concerns. Sara Wallace RN Navigator Cardiac Imaging Merle Prescott RN Navigator Cardiac Imaging [facility] [phone] Office     Signed, Ramir Malerba A Teliah Buffalo, MD  12/06/2022 10:43 AM    [facility] 

## 2022-12-07 ENCOUNTER — Telehealth: Payer: Self-pay | Admitting: *Deleted

## 2022-12-07 LAB — BASIC METABOLIC PANEL
BUN/Creatinine Ratio: 14 (ref 12–28)
BUN: 11 mg/dL (ref 8–27)
CO2: 28 mmol/L (ref 20–29)
Calcium: 8.5 mg/dL — ABNORMAL LOW (ref 8.7–10.3)
Chloride: 105 mmol/L (ref 96–106)
Creatinine, Ser: 0.76 mg/dL (ref 0.57–1.00)
Glucose: 96 mg/dL (ref 70–99)
Potassium: 3.7 mmol/L (ref 3.5–5.2)
Sodium: 142 mmol/L (ref 134–144)
eGFR: 79 mL/min/{1.73_m2} (ref >59)

## 2022-12-07 LAB — CBC
Hemoglobin: 10.1 g/dL — ABNORMAL LOW (ref 11.1–15.9)
MCH: 33 pg (ref 26.6–33.0)
MCHC: 32.5 g/dL (ref 31.5–35.7)
MCV: 102 fL — ABNORMAL HIGH (ref 79–97)
Platelets: 197 10*3/uL (ref 150–450)
RBC: 3.06 x10E6/uL — ABNORMAL LOW (ref 3.77–5.28)
RDW: 16.3 % — ABNORMAL HIGH (ref 11.7–15.4)
WBC: 3.5 10*3/uL (ref 3.4–10.8)

## 2022-12-07 LAB — ANEMIA PANEL
Ferritin: 117 ng/mL (ref 15–150)
Folate, Hemolysate: 277 ng/mL
Folate, RBC: 891 ng/mL (ref >498)
Hematocrit: 31.1 % — ABNORMAL LOW (ref 34.0–46.6)
Iron Saturation: 12 % — ABNORMAL LOW (ref 15–55)
Iron: 34 ug/dL (ref 27–139)
Retic Ct Pct: 2 % (ref 0.6–2.6)
Total Iron Binding Capacity: 276 ug/dL (ref 250–450)
UIBC: 242 ug/dL (ref 118–369)
Vitamin B-12: 919 pg/mL (ref 232–1245)

## 2022-12-07 NOTE — Telephone Encounter (Addendum)
Cardiac Catheterization scheduled at [facility] for: Thursday December 08, 2022 9 AM Arrival time [facility] Main Entrance A at: 7 AM  Nothing to eat after midnight prior to procedure, clear liquids until 5 AM day of procedure.   Medication instructions: -Hold:  Lasix -AM of procedure -Other usual morning medications can be taken with sips of water including aspirin 81 mg.  Confirmed patient has responsible adult to drive home post procedure and be with patient first 24 hours after arriving home.  Plan to go home the same day, you will only stay overnight if medically necessary.  Reviewed procedure instructions with patient's son, Phill Myron [facility])- speaks and understands [place]. He will be providing transportation.

## 2022-12-08 ENCOUNTER — Ambulatory Visit (HOSPITAL_COMMUNITY)
Admission: RE | Admit: 2022-12-08 | Discharge: 2022-12-08 | Disposition: A | Discharge status: Home / Self Care | Payer: Medicare Other | Attending: Internal Medicine | Admitting: Internal Medicine

## 2022-12-08 ENCOUNTER — Ambulatory Visit (HOSPITAL_COMMUNITY)
Admission: RE | Disposition: A | Discharge status: Home / Self Care | Payer: Self-pay | Source: Home / Self Care | Attending: Internal Medicine

## 2022-12-08 ENCOUNTER — Other Ambulatory Visit: Payer: Self-pay

## 2022-12-08 DIAGNOSIS — I25118 Atherosclerotic heart disease of native coronary artery with other forms of angina pectoris: Secondary | ICD-10-CM

## 2022-12-08 DIAGNOSIS — Z79899 Other long term (current) drug therapy: Secondary | ICD-10-CM | POA: Insufficient documentation

## 2022-12-08 DIAGNOSIS — I10 Essential (primary) hypertension: Secondary | ICD-10-CM | POA: Insufficient documentation

## 2022-12-08 DIAGNOSIS — I249 Acute ischemic heart disease, unspecified: Secondary | ICD-10-CM | POA: Insufficient documentation

## 2022-12-08 DIAGNOSIS — E785 Hyperlipidemia, unspecified: Secondary | ICD-10-CM | POA: Diagnosis not present

## 2022-12-08 DIAGNOSIS — I421 Obstructive hypertrophic cardiomyopathy: Secondary | ICD-10-CM | POA: Insufficient documentation

## 2022-12-08 DIAGNOSIS — I252 Old myocardial infarction: Secondary | ICD-10-CM | POA: Diagnosis not present

## 2022-12-08 DIAGNOSIS — I2510 Atherosclerotic heart disease of native coronary artery without angina pectoris: Secondary | ICD-10-CM | POA: Insufficient documentation

## 2022-12-08 HISTORY — PX: LEFT HEART CATH AND CORONARY ANGIOGRAPHY: CATH118249

## 2022-12-08 SURGERY — LEFT HEART CATH AND CORONARY ANGIOGRAPHY
Anesthesia: LOCAL

## 2022-12-08 MED ORDER — IOHEXOL 350 MG/ML SOLN
INTRAVENOUS | Status: DC | PRN
  Administered 2022-12-08: 80 mL

## 2022-12-08 MED ORDER — SODIUM CHLORIDE 0.9 % IV SOLN: 250.0000 mL | INTRAVENOUS | Status: DC | PRN

## 2022-12-08 MED ORDER — SODIUM CHLORIDE 0.9 % WEIGHT BASED INFUSION: 1.0000 mL/kg/h | INTRAVENOUS | Status: DC

## 2022-12-08 MED ORDER — HEPARIN SODIUM (PORCINE) 1000 UNIT/ML IJ SOLN
INTRAMUSCULAR | Status: AC
  Filled 2022-12-08: qty 10

## 2022-12-08 MED ORDER — HEPARIN (PORCINE) IN NACL 1000-0.9 UT/500ML-% IV SOLN
INTRAVENOUS | Status: DC | PRN
  Administered 2022-12-08 (×2): 500 mL

## 2022-12-08 MED ORDER — HEPARIN SODIUM (PORCINE) 1000 UNIT/ML IJ SOLN
INTRAMUSCULAR | Status: DC | PRN
  Administered 2022-12-08: 5000 [IU] via INTRAVENOUS

## 2022-12-08 MED ORDER — HYDRALAZINE HCL 20 MG/ML IJ SOLN: 10.0000 mg | INTRAMUSCULAR | Status: DC | PRN

## 2022-12-08 MED ORDER — FENTANYL CITRATE (PF) 100 MCG/2ML IJ SOLN
INTRAMUSCULAR | Status: AC
  Filled 2022-12-08: qty 2

## 2022-12-08 MED ORDER — SODIUM CHLORIDE 0.9% FLUSH: 3.0000 mL | INTRAVENOUS | Status: DC | PRN

## 2022-12-08 MED ORDER — ASPIRIN 81 MG PO CHEW: 81.0000 mg | CHEWABLE_TABLET | ORAL | Status: DC

## 2022-12-08 MED ORDER — VERAPAMIL HCL 2.5 MG/ML IV SOLN
INTRAVENOUS | Status: AC
  Filled 2022-12-08: qty 2

## 2022-12-08 MED ORDER — SODIUM CHLORIDE 0.9 % IV SOLN: INTRAVENOUS | Status: DC

## 2022-12-08 MED ORDER — MIDAZOLAM HCL 2 MG/2ML IJ SOLN
INTRAMUSCULAR | Status: AC
  Filled 2022-12-08: qty 2

## 2022-12-08 MED ORDER — ACETAMINOPHEN 325 MG PO TABS: 650.0000 mg | ORAL_TABLET | ORAL | Status: DC | PRN

## 2022-12-08 MED ORDER — MIDAZOLAM HCL 2 MG/2ML IJ SOLN
INTRAMUSCULAR | Status: DC | PRN
  Administered 2022-12-08: 1 mg via INTRAVENOUS

## 2022-12-08 MED ORDER — LABETALOL HCL 5 MG/ML IV SOLN: 10.0000 mg | INTRAVENOUS | Status: DC | PRN

## 2022-12-08 MED ORDER — VERAPAMIL HCL 2.5 MG/ML IV SOLN
INTRAVENOUS | Status: DC | PRN
  Administered 2022-12-08: 10 mL via INTRA_ARTERIAL

## 2022-12-08 MED ORDER — SODIUM CHLORIDE 0.9 % WEIGHT BASED INFUSION
3.0000 mL/kg/h | INTRAVENOUS | Status: AC
  Administered 2022-12-08: 3 mL/kg/h via INTRAVENOUS

## 2022-12-08 MED ORDER — FENTANYL CITRATE (PF) 100 MCG/2ML IJ SOLN
INTRAMUSCULAR | Status: DC | PRN
  Administered 2022-12-08: 25 ug via INTRAVENOUS

## 2022-12-08 MED ORDER — ONDANSETRON HCL 4 MG/2ML IJ SOLN: 4.0000 mg | Freq: Four times a day (QID) | INTRAMUSCULAR | Status: DC | PRN

## 2022-12-08 MED ORDER — SODIUM CHLORIDE 0.9% FLUSH: 3.0000 mL | Freq: Two times a day (BID) | INTRAVENOUS | Status: DC

## 2022-12-08 MED ORDER — LIDOCAINE HCL (PF) 1 % IJ SOLN
INTRAMUSCULAR | Status: AC
  Filled 2022-12-08: qty 30

## 2022-12-08 SURGICAL SUPPLY — 14 items
CATH INFINITI 5 FR JR3.5 (CATHETERS) IMPLANT
CATH INFINITI 5FR ANG PIGTAIL (CATHETERS) IMPLANT
CATH LAUNCHER 6FR EBU3.5 (CATHETERS) IMPLANT
CATH OPTITORQUE TIG 4.0 6F (CATHETERS) IMPLANT
DEVICE RAD TR BAND REGULAR (VASCULAR PRODUCTS) IMPLANT
GLIDESHEATH SLEND SS 6F .021 (SHEATH) IMPLANT
KIT HEART LEFT (KITS) ×1 IMPLANT
KIT HEMO VALVE WATCHDOG (MISCELLANEOUS) IMPLANT
PACK CARDIAC CATHETERIZATION (CUSTOM PROCEDURE TRAY) ×1 IMPLANT
SHEATH 6FR 75 DEST SLENDER (SHEATH) IMPLANT
TRANSDUCER W/STOPCOCK (MISCELLANEOUS) ×1 IMPLANT
TUBING CIL FLEX 10 FLL-RA (TUBING) ×1 IMPLANT
WIRE EMERALD 3MM-J .035X260CM (WIRE) IMPLANT
WIRE EMERALD ST .035X260CM (WIRE) IMPLANT

## 2022-12-08 NOTE — Interval H&P Note (Signed)
History and Physical Interval Note:  12/08/2022 7:33 AM  Dana [name]  has presented today for surgery, with the diagnosis of elevated trop - recent nstemi.  The various methods of treatment have been discussed with the patient and family. After consideration of risks, benefits and other options for treatment, the patient has consented to  Procedure(s): LEFT HEART CATH AND CORONARY ANGIOGRAPHY (N/A) as a surgical intervention.  The patient's history has been reviewed, patient examined, no change in status, stable for surgery.  I have reviewed the patient's chart and labs.  Questions were answered to the patient's satisfaction.     Orbie Pyo

## 2022-12-09 ENCOUNTER — Encounter (HOSPITAL_COMMUNITY): Payer: Self-pay | Admitting: Internal Medicine

## 2022-12-09 MED FILL — Lidocaine HCl Local Preservative Free (PF) Inj 1%: INTRAMUSCULAR | Qty: 30 | Status: AC

## 2022-12-15 ENCOUNTER — Telehealth: Payer: Self-pay

## 2022-12-15 ENCOUNTER — Other Ambulatory Visit: Payer: Self-pay | Admitting: Pharmacist

## 2022-12-15 ENCOUNTER — Other Ambulatory Visit: Payer: Self-pay | Admitting: Internal Medicine

## 2022-12-15 DIAGNOSIS — D500 Iron deficiency anemia secondary to blood loss (chronic): Secondary | ICD-10-CM

## 2022-12-15 DIAGNOSIS — D649 Anemia, unspecified: Secondary | ICD-10-CM

## 2022-12-15 DIAGNOSIS — K2970 Gastritis, unspecified, without bleeding: Secondary | ICD-10-CM

## 2022-12-15 DIAGNOSIS — D509 Iron deficiency anemia, unspecified: Secondary | ICD-10-CM | POA: Insufficient documentation

## 2022-12-15 NOTE — Telephone Encounter (Signed)
Auth Submission: NO AUTH NEEDED Site of care: Site of care: [facility] Payer: Medicare & Medicaid Medication & CPT/J Code(s) submitted: Feraheme (ferumoxytol) [id number]  Auth type: Buy/Bill Units/visits requested: 2  Approval from: 12/15/2022 to 04/16/2023

## 2022-12-15 NOTE — Telephone Encounter (Signed)
Orders fixed

## 2022-12-20 ENCOUNTER — Ambulatory Visit: Payer: Medicare Other | Attending: Genetic Counselor | Admitting: Genetic Counselor

## 2022-12-26 ENCOUNTER — Ambulatory Visit: Payer: Medicare Other

## 2022-12-26 MED ORDER — DIPHENHYDRAMINE HCL 25 MG PO CAPS: 25.0000 mg | ORAL_CAPSULE | Freq: Once | ORAL | Status: AC

## 2022-12-26 MED ORDER — SODIUM CHLORIDE 0.9 % IV SOLN
510.0000 mg | Freq: Once | INTRAVENOUS | Status: AC
  Filled 2022-12-26: qty 17

## 2022-12-26 MED ORDER — ACETAMINOPHEN 325 MG PO TABS: 650.0000 mg | ORAL_TABLET | Freq: Once | ORAL | Status: AC

## 2022-12-26 NOTE — Progress Notes (Signed)
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  Personal Medical Information Dana [name] (II.2 on pedigree), a pleasant lady, originally from [place] is here today with her son and son-in-law, Dana [name] who also help interpret today's conversations with her. Her son recently moved to [place] from [place] about 2 weeks ago.   She was referred to a cardiologist recently for chest pains and underwent cardiac imaging studies that detected severe asymmetric left ventricular hypertrophy with LVEF of 70-75%. She reports having dyspnea for the last 10 years as well as fatigue. Denies having heart palpitations, dizziness or syncope.  Family history  Relation to Proband Pedigree # Current age Heart condition/age of onset Notes  Daughter,1 III.7 48 None Echo/EKG to be done  Deceased sons, 2 III.5, III.6 Deceased None II.5- Died @ 17 from leukemia II.6- Died of poor health @ 36, alcoholic  Sons, 4 III.4,  III.8-III.10 60, 40, 39, 34 None Echo/EKG to be done        Brother, 1 II.3 Deceased None Died @ 29s- liver disease  Sisters, 2 II.1, II.4 34, 70 None Echo/EKG to be done  Nephews, nieces III.1-III.3, III.11- III.12  ? None III.2- Died suddenly, details unknown        Father I.1 Deceased None Died @ 59- liver cirrhosis, alcoholic  Mother I.2 Deceased None Died @ 32- liver cirrhosis    Genetics Dana [name] was counseled on the genetics of hypertrophic cardiomyopathy (HCM). I explained to the patient that this is an autosomal dominant condition with incomplete penetrance i.e. not all individuals harboring the HCM mutation will present clinically with HCM, and age-related penetrance where clinical presentation of HCM increases with advanced age. Variability in clinical expression is also seen in families  with HCM with affected family members presenting clinically at different ages and with symptoms ranging from mild to severe.  Since HCM is an autosomal dominant condition, first degree-relatives should seek regular surveillance for HCM.  First-degree relatives, include his daughter, brother and sister. Clinical screening of first-degree relatives involves echocardiogram and EKG at regular intervals, frequency is typically determined by age, with those in their teens undergoing screening every year until the age of 12 and those over the age of 47 getting screened every 3-5 years. Patient verbalized understanding of this.  I informed the patient that about 8-10% of HCM patients can have compound and digenic mutations for HCM. Also briefly discussed the inheritance pattern and treatment /management plans for the infiltrative cardiomyopathies that present as HCM phenocopies.   We walked through the process of genetic testing. I explained to the patient that genetic testing is a probabilistic test dependent upon age and severity of presentation, presence of risk factors for HCM and importantly family history of HCM or sudden death in first-degree relatives.   The potential outcomes of genetic testing and subsequent management of at-risk family members were discussed so as to manage expectations-  If a mutation is not identified, then all first-degree relatives should undergo regular screening for HCM. I emphasized that even if the genetic test is negative, it does not mean that he does not have HCM. A negative test result can be due to limitations of the genetic test. Patient verbalized understanding of this.  There is also the likelihood of identifying a "Variant of unknown significance". This result means that the variant has  not been detected in a statistically significant number of HCM patients and/or functional studies have not been performed to verify its pathogenicity. This VUS can be tested in the  family to see if it segregates with disease. If a VUS is found, first-degree relatives should undergo regular clinical screening for HCM.  If a pathogenic variant is reported, then first-degree family members can get tested for this variant. If they test positive, it is likely they will develop HCM. In light of variable expression and incomplete penetrance associated with HCM, it is not possible to predict when they will manifest clinically with HCM. It is recommended that family members that test positive for the familial pathogenic variant pursue clinical screening for HCM. Family members that test negative for the familial mutation need not pursue periodic screening for HCM, but seek care if symptoms develop.   Impression and Plan  Dana [name] was found to have cardiac wall thickness suggestive of HCM at age 44 in the absence of other loading conditions that can cause cardiac hypertrophy. She does not report a family history of HCM or sudden death amongst her first-degree relatives.  Genetic testing for HCM is recommended. This test should include the sarcomeric genes, namely MYBPC3, MYH7, TNNI3, TNNT2, TPM1, ACTC1, MYL2, MYL3 implicated in HCM as well as the genes for the HCM phenocopies, such as Fabry disease (GLA), Danon disease (LAMP2), WPW syndrome (PRKAG2) and familial transthyretin amyloidosis (TTR) and phospholamban (PLN)  In addition, we discussed the protections afforded by the Genetic Information Non-Discrimination Act (GINA). I explained to the patient that GINA protects them from losing their employment or health insurance based on their genotype. However, these protections do not cover life insurance and disability. Patient and her relatives verbalized understanding of this.  Please note that the patient has not been counseled in this visit on other personal, cultural or ethical issues that they may face due to their heart condition.   Plan Dana [name] declines genetic testing for HCM due to  insurance non-coverage for her test. Her son and son-in -law will discuss routine screening for HCM with her children.  Sidney Ace, Ph.D, [facility] Clinical Molecular Geneticist

## 2022-12-27 ENCOUNTER — Telehealth: Payer: Self-pay | Admitting: Pharmacy Technician

## 2022-12-27 ENCOUNTER — Other Ambulatory Visit: Payer: Self-pay

## 2022-12-27 ENCOUNTER — Ambulatory Visit (HOSPITAL_COMMUNITY)
Admission: RE | Admit: 2022-12-27 | Discharge: 2022-12-27 | Disposition: A | Discharge status: Home / Self Care | Payer: Medicare Other | Source: Ambulatory Visit | Attending: Internal Medicine | Admitting: Internal Medicine

## 2022-12-27 VITALS — BP 104/55 | HR 63 | Temp 98.3°F | Resp 16

## 2022-12-27 DIAGNOSIS — D649 Anemia, unspecified: Secondary | ICD-10-CM | POA: Insufficient documentation

## 2022-12-27 DIAGNOSIS — K2990 Gastroduodenitis, unspecified, without bleeding: Secondary | ICD-10-CM | POA: Diagnosis not present

## 2022-12-27 DIAGNOSIS — D500 Iron deficiency anemia secondary to blood loss (chronic): Secondary | ICD-10-CM | POA: Insufficient documentation

## 2022-12-27 DIAGNOSIS — K2970 Gastritis, unspecified, without bleeding: Secondary | ICD-10-CM | POA: Insufficient documentation

## 2022-12-27 DIAGNOSIS — I421 Obstructive hypertrophic cardiomyopathy: Secondary | ICD-10-CM

## 2022-12-27 MED ORDER — ACETAMINOPHEN 325 MG PO TABS
650.0000 mg | ORAL_TABLET | Freq: Once | ORAL | Status: AC
  Administered 2022-12-27: 650 mg via ORAL

## 2022-12-27 MED ORDER — SODIUM CHLORIDE 0.9 % IV SOLN
510.0000 mg | Freq: Once | INTRAVENOUS | Status: AC
  Administered 2022-12-27: 510 mg via INTRAVENOUS
  Filled 2022-12-27: qty 510

## 2022-12-27 MED ORDER — DIPHENHYDRAMINE HCL 25 MG PO CAPS
25.0000 mg | ORAL_CAPSULE | Freq: Once | ORAL | Status: AC
  Administered 2022-12-27: 25 mg via ORAL

## 2022-12-27 NOTE — Telephone Encounter (Signed)
Auth Submission: NO AUTH NEEDED Site of care: Site of care: [facility] Payer: MEDICARE A/B - MEDICAID Medication & CPT/J Code(s) submitted: Feraheme (ferumoxytol) [id number] Route of submission (phone, fax, portal):  Phone # Fax # Auth type: Buy/Bill Units/visits requested: X2 Reference number:  Approval from: 12/27/22 to 04/29/23

## 2022-12-27 NOTE — Progress Notes (Signed)
Diagnosis: Iron Deficiency Anemia  Provider:  Riley Lam A MD  Procedure: IV Infusion  IV Type: Peripheral, IV Location: Left Wrist  Feraheme (Ferumoxytol), Dose: 510 mg  Infusion Start Time: 1411  Infusion Stop Time: 1428  Post Infusion IV Care: Observation period completed and Peripheral IV Discontinued  Discharge: Condition: Good, Destination: Home . AVS Provided  Performed by:  Enzo Montgomery, RN

## 2022-12-27 NOTE — Addendum Note (Signed)
Encounter addended by: Arrie Senate, RN on: 12/27/2022 3:32 PM  Actions taken: Therapy plan modified

## 2023-01-02 ENCOUNTER — Ambulatory Visit: Payer: Medicare Other

## 2023-01-03 ENCOUNTER — Encounter (HOSPITAL_COMMUNITY)
Admission: RE | Admit: 2023-01-03 | Discharge: 2023-01-03 | Disposition: A | Discharge status: Home / Self Care | Payer: Medicare Other | Source: Ambulatory Visit | Attending: Internal Medicine | Admitting: Internal Medicine

## 2023-01-03 ENCOUNTER — Encounter (HOSPITAL_COMMUNITY): Admission: RE | Admit: 2023-01-03 | Payer: Medicare Other | Source: Ambulatory Visit

## 2023-01-03 VITALS — BP 134/65 | HR 66 | Temp 98.2°F | Resp 16

## 2023-01-03 DIAGNOSIS — D500 Iron deficiency anemia secondary to blood loss (chronic): Secondary | ICD-10-CM | POA: Insufficient documentation

## 2023-01-03 DIAGNOSIS — K2970 Gastritis, unspecified, without bleeding: Secondary | ICD-10-CM | POA: Diagnosis not present

## 2023-01-03 DIAGNOSIS — D649 Anemia, unspecified: Secondary | ICD-10-CM

## 2023-01-03 DIAGNOSIS — K2990 Gastroduodenitis, unspecified, without bleeding: Secondary | ICD-10-CM | POA: Insufficient documentation

## 2023-01-03 MED ORDER — SODIUM CHLORIDE 0.9 % IV SOLN
510.0000 mg | Freq: Once | INTRAVENOUS | Status: AC
  Administered 2023-01-03: 510 mg via INTRAVENOUS
  Filled 2023-01-03: qty 510

## 2023-01-03 MED ORDER — ACETAMINOPHEN 325 MG PO TABS
650.0000 mg | ORAL_TABLET | Freq: Once | ORAL | Status: AC
  Administered 2023-01-03: 650 mg via ORAL

## 2023-01-03 MED ORDER — DIPHENHYDRAMINE HCL 25 MG PO CAPS
25.0000 mg | ORAL_CAPSULE | Freq: Once | ORAL | Status: AC
  Administered 2023-01-03: 25 mg via ORAL

## 2023-01-03 NOTE — Progress Notes (Signed)
Diagnosis: Iron Deficiency Anemia  Provider:  Riley Lam A MD  Procedure: IV Infusion  IV Type: Peripheral, IV Location: R Hand  Feraheme (Ferumoxytol), Dose: 510 mg  Infusion Start Time: 1320  Infusion Stop Time: 1338  Post Infusion IV Care: Observation period completed and Peripheral IV Discontinued  Discharge: Condition: Good, Destination: Home . AVS Declined  Performed by:  Marlow Baars Pilkington-Burchett, RN

## 2023-01-03 NOTE — Addendum Note (Signed)
Encounter addended by: Arrie Senate, RN on: 01/03/2023 2:19 PM  Actions taken: Therapy plan modified

## 2023-01-05 ENCOUNTER — Ambulatory Visit: Payer: Medicare Other | Admitting: Physician Assistant

## 2023-01-11 ENCOUNTER — Other Ambulatory Visit: Payer: Self-pay

## 2023-01-25 ENCOUNTER — Encounter: Payer: Self-pay | Admitting: Student

## 2023-01-25 ENCOUNTER — Ambulatory Visit (INDEPENDENT_AMBULATORY_CARE_PROVIDER_SITE_OTHER): Payer: Medicare Other | Admitting: Student

## 2023-01-25 ENCOUNTER — Other Ambulatory Visit (HOSPITAL_COMMUNITY)
Admission: RE | Admit: 2023-01-25 | Discharge: 2023-01-25 | Disposition: A | Discharge status: Home / Self Care | Payer: Medicare Other | Source: Ambulatory Visit | Attending: Student | Admitting: Student

## 2023-01-25 VITALS — BP 132/78 | HR 70 | Ht 62.0 in | Wt 160.8 lb

## 2023-01-25 DIAGNOSIS — D649 Anemia, unspecified: Secondary | ICD-10-CM | POA: Insufficient documentation

## 2023-01-25 DIAGNOSIS — I2510 Atherosclerotic heart disease of native coronary artery without angina pectoris: Secondary | ICD-10-CM | POA: Insufficient documentation

## 2023-01-25 DIAGNOSIS — E785 Hyperlipidemia, unspecified: Secondary | ICD-10-CM | POA: Insufficient documentation

## 2023-01-25 DIAGNOSIS — I421 Obstructive hypertrophic cardiomyopathy: Secondary | ICD-10-CM | POA: Insufficient documentation

## 2023-01-25 LAB — CBC
HCT: 35.2 % — ABNORMAL LOW (ref 36.0–46.0)
Hemoglobin: 11.7 g/dL — ABNORMAL LOW (ref 12.0–15.0)
MCH: 33 pg (ref 26.0–34.0)
MCHC: 33.2 g/dL (ref 30.0–36.0)
MCV: 99.2 fL (ref 80.0–100.0)
Platelets: 185 10*3/uL (ref 150–400)
RBC: 3.55 MIL/uL — ABNORMAL LOW (ref 3.87–5.11)
RDW: 15.9 % — ABNORMAL HIGH (ref 11.5–15.5)
WBC: 3.8 10*3/uL — ABNORMAL LOW (ref 4.0–10.5)
nRBC: 0 % (ref 0.0–0.2)

## 2023-01-25 NOTE — Patient Instructions (Signed)
Medication Instructions:  Your physician recommends that you continue on your current medications as directed. Please refer to the Current Medication list given to you today.  *If you need a refill on your cardiac medications before your next appointment, please call your pharmacy*   Lab Work: Your physician recommends that you return for lab work in: Today ( CBC)    If you have labs (blood work) drawn today and your tests are completely normal, you will receive your results only by: MyChart Message (if you have MyChart) OR A paper copy in the mail If you have any lab test that is abnormal or we need to change your treatment, we will call you to review the results.   Testing/Procedures: Your physician has requested that you have a cardiac MRI. Cardiac MRI uses a computer to create images of your heart as its beating, producing both still and moving pictures of your heart and major blood vessels. For further information please visit [email or web address]. Please follow the instruction sheet given to you today for more information.    Follow-Up: At [facility], you and your health needs are our priority.  As part of our continuing mission to provide you with exceptional heart care, we have created designated Provider Care Teams.  These Care Teams include your primary Cardiologist (physician) and Advanced Practice Providers (APPs -  Physician Assistants and Nurse Practitioners) who all work together to provide you with the care you need, when you need it.  We recommend signing up for the patient portal called "MyChart".  Sign up information is provided on this After Visit Summary.  MyChart is used to connect with patients for Virtual Visits (Telemedicine).  Patients are able to view lab/test results, encounter notes, upcoming appointments, etc.  Non-urgent messages can be sent to your provider as well.   To learn more about what you can do with MyChart, go to [email or web address].     Your next appointment:   4 -5 month(s)  Provider:   You may see Christell Constant, MD or one of the following Advanced Practice Providers on your designated Care Team:   Randall An, PA-C  Jacolyn Reedy, PA-C     Other Instructions Thank you for choosing  HeartCare!

## 2023-01-25 NOTE — Progress Notes (Signed)
Cardiology Office Note    Date:  01/25/2023  ID:  Magan, Degenhart May 14, 1941, MRN [id number] Cardiologist: Christell Constant, MD   HOCM: Dr. Izora Ribas  History of Present Illness:    Dana [name] is a 82 y.o. female with past medical history of CAD (s/p DES to OM1 in 2018 with 60% stenosis along first marginal bifurcation lesion jailed by initial stent and treated with PTCA), HOCM with septal variant, RA, anemia and gout who presents to the office today for 6-week follow-up.  She was examined by Dr. Izora Ribas in 11/2022 and denied any recent chest pain, dyspnea on exertion or fatigue at that time. It was felt that she would not tolerate dual AV nodal blocking agents for hypertrophic cardiomyopathy, therefore she was continued on Lasix 20 mg daily and Verapamil 360 mg daily. A cardiac MRI was also recommended but not yet performed. Given her recent NSTEMI and no recurrent bleeding, a cardiac catheterization was recommended for definitive evaluation. This was performed by Dr. Lynnette Caffey on 12/08/2022 and showed a patent OM stent with mild disease elsewhere and medical therapy was recommended.  She also had follow-up labs in the interim which showed she remained anemic with hemoglobin at 10.1 and it was recommended to proceed with IV iron.  In talking with the patient and 2 of her sons today, they decline an interpreter and one of her sons translates during this encounter. She reports doing well since her last office visit and denies any recent chest pain or palpitations. Says she only has chest pain when she misses doses of her medications which has not occurred within the past several months. No recent dyspnea on exertion, orthopnea, PND or pitting edema. They do not check her vitals routinely at home but BP is at 132/78 during today's visit. She has been receiving iron infusions intermittently but does not report a change in energy or stamina with this.  Studies Reviewed:   EKG: EKG is  not ordered today.  Limited Echo: 09/2022 IMPRESSIONS     1. 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  No thrombus is seen. Left ventricular ejection  fraction, by estimation, is 70 to 75%. The left ventricle has hyperdynamic  function. There is moderate asymmetric left ventricular hypertrophy. Left  ventricular diastolic function could not be evaluated.   2. Right ventricular systolic function is normal. The right ventricular  size is normal.   3. Left atrial size was moderately dilated.   4. There is severe systolic anterior motion of the mitral leaflets. There  is at least moderate, probably severe mitral insufficiency due to [facility]..  Severe mitral valve regurgitation.   5. The right coronary cusp is severely calcified and immobile, but the  other two cusps move well. The aortic outflow tract gradient appears to be  largely due to dynamicsubvalvular obstruction due to [facility] and not due to  valvular disease. Peak gradient at  rest is approximately 65 mm Hg. The aortic valve is tricuspid. There is  moderate calcification of the aortic valve. There is moderate thickening  of the aortic valve. Aortic valve regurgitation is trivial.   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   LHC: 11/2022   Ost LM lesion is 20% stenosed.   1st Mrg lesion is 10% stenosed.   Mid LAD lesion is  30% stenosed.   1.  Patent obtuse marginal stent with mild diffuse disease elsewhere. 2.  LVEDP of 21 mmHg. 3.  Upper extremity tortuosity requiring destination sheath for catheter engagement.   Recommendation: Medical therapy.   Physical Exam:   VS:  BP 132/78   Pulse 70   Ht 5\' 2"  (1.575 m)   Wt 160 lb 12.8 oz (72.9 kg)   SpO2 98%    BMI 29.41 kg/m    Wt Readings from Last 3 Encounters:  01/25/23 160 lb 12.8 oz (72.9 kg)  12/08/22 159 lb 3.3 oz (72.2 kg)  12/06/22 157 lb (71.2 kg)     GEN: Well nourished, well developed in no acute distress NECK: No JVD; No carotid bruits CARDIAC: RRR, 3/6 systolic murmur present throughout.  RESPIRATORY:  Clear to auscultation without rales, wheezing or rhonchi  ABDOMEN: Appears non-distended. No obvious abdominal masses. EXTREMITIES: No clubbing or cyanosis. No pitting edema.  Distal pedal pulses are 2+ bilaterally.   Assessment and Plan:   1. HOCM - She has known HOCM with septal variant and has been followed by Dr. Izora Ribas for this. Thankfully, she reports overall feeling well and denies any dyspnea or chest pain. A cardiac MRI was previously recommended at the time of her last evaluation but this has not yet been scheduled. Will see if our schedulers can assist in arranging this today.Discussed screening with the patient's sons and they are working on getting this arranged.  - Continue current medical therapy for now with Lasix 10 mg daily and Verapamil 360 mg daily.  2. CAD - She is s/p DES to OM1 in 2018 with 60% stenosis along first marginal bifurcation lesion jailed by initial stent and treated with PTCA. Most recent catheterization last month showed a patent stent with mild disease elsewhere. - She denies any recent anginal symptoms. Continue current medical therapy with ASA 81 mg daily, Atorvastatin 10 mg daily and Zetia 10 mg daily.  3. HLD - FLP in 09/2022 showed total cholesterol 84, triglycerides 65, HDL 27 and LDL 44.  Continue current medical therapy with Atorvastatin 10 mg daily and Zetia 10 mg daily.  4. Anemia/History of GIB - Her hemoglobin was previously 9.0 - 10.0 earlier this year and she was referred for IV iron infusions by Dr. Izora Ribas. Will recheck a CBC today.   Signed, Ellsworth Lennox, PA-C

## 2023-02-09 ENCOUNTER — Encounter (HOSPITAL_COMMUNITY): Payer: Self-pay | Admitting: Emergency Medicine

## 2023-02-09 ENCOUNTER — Other Ambulatory Visit: Payer: Self-pay

## 2023-02-09 ENCOUNTER — Emergency Department (HOSPITAL_COMMUNITY): Payer: Medicare Other

## 2023-02-09 ENCOUNTER — Emergency Department (HOSPITAL_COMMUNITY)
Admission: EM | Admit: 2023-02-09 | Discharge: 2023-02-09 | Disposition: A | Discharge status: Home / Self Care | Payer: Medicare Other | Attending: Emergency Medicine | Admitting: Emergency Medicine

## 2023-02-09 DIAGNOSIS — R0602 Shortness of breath: Secondary | ICD-10-CM | POA: Insufficient documentation

## 2023-02-09 DIAGNOSIS — D72819 Decreased white blood cell count, unspecified: Secondary | ICD-10-CM | POA: Insufficient documentation

## 2023-02-09 DIAGNOSIS — R011 Cardiac murmur, unspecified: Secondary | ICD-10-CM | POA: Diagnosis not present

## 2023-02-09 DIAGNOSIS — N390 Urinary tract infection, site not specified: Secondary | ICD-10-CM | POA: Insufficient documentation

## 2023-02-09 DIAGNOSIS — D696 Thrombocytopenia, unspecified: Secondary | ICD-10-CM | POA: Diagnosis not present

## 2023-02-09 DIAGNOSIS — R109 Unspecified abdominal pain: Secondary | ICD-10-CM | POA: Diagnosis present

## 2023-02-09 DIAGNOSIS — I509 Heart failure, unspecified: Secondary | ICD-10-CM | POA: Diagnosis not present

## 2023-02-09 DIAGNOSIS — I2510 Atherosclerotic heart disease of native coronary artery without angina pectoris: Secondary | ICD-10-CM | POA: Diagnosis not present

## 2023-02-09 LAB — BASIC METABOLIC PANEL
Anion gap: 10 (ref 5–15)
BUN: 18 mg/dL (ref 8–23)
CO2: 22 mmol/L (ref 22–32)
Calcium: 8.6 mg/dL — ABNORMAL LOW (ref 8.9–10.3)
Chloride: 103 mmol/L (ref 98–111)
Creatinine, Ser: 0.76 mg/dL (ref 0.44–1.00)
GFR, Estimated: >60 mL/min (ref >60)
Glucose, Bld: 145 mg/dL — ABNORMAL HIGH (ref 70–99)
Potassium: 3.5 mmol/L (ref 3.5–5.1)
Sodium: 135 mmol/L (ref 135–145)

## 2023-02-09 LAB — CBC WITH DIFFERENTIAL/PLATELET
Abs Immature Granulocytes: 0.01 10*3/uL (ref 0.00–0.07)
Basophils Absolute: 0 10*3/uL (ref 0.0–0.1)
Basophils Relative: 1 %
Eosinophils Absolute: 0.1 10*3/uL (ref 0.0–0.5)
Eosinophils Relative: 2 %
HCT: 33.4 % — ABNORMAL LOW (ref 36.0–46.0)
Hemoglobin: 11.5 g/dL — ABNORMAL LOW (ref 12.0–15.0)
Immature Granulocytes: 0 %
Lymphocytes Relative: 20 %
Lymphs Abs: 0.6 10*3/uL — ABNORMAL LOW (ref 0.7–4.0)
MCH: 33.9 pg (ref 26.0–34.0)
MCHC: 34.4 g/dL (ref 30.0–36.0)
MCV: 98.5 fL (ref 80.0–100.0)
Monocytes Absolute: 0.1 10*3/uL (ref 0.1–1.0)
Monocytes Relative: 2 %
Neutro Abs: 2.5 10*3/uL (ref 1.7–7.7)
Neutrophils Relative %: 75 %
Platelets: 128 10*3/uL — ABNORMAL LOW (ref 150–400)
RBC: 3.39 MIL/uL — ABNORMAL LOW (ref 3.87–5.11)
RDW: 15.8 % — ABNORMAL HIGH (ref 11.5–15.5)
WBC: 3.3 10*3/uL — ABNORMAL LOW (ref 4.0–10.5)
nRBC: 0 % (ref 0.0–0.2)

## 2023-02-09 LAB — URINALYSIS, ROUTINE W REFLEX MICROSCOPIC
Bilirubin Urine: NEGATIVE
Glucose, UA: NEGATIVE mg/dL
Ketones, ur: NEGATIVE mg/dL
Leukocytes,Ua: NEGATIVE
Nitrite: POSITIVE — AB
Protein, ur: NEGATIVE mg/dL
Specific Gravity, Urine: 1.008 (ref 1.005–1.030)
pH: 5 (ref 5.0–8.0)

## 2023-02-09 LAB — TROPONIN I (HIGH SENSITIVITY)
Troponin I (High Sensitivity): 100 ng/L (ref <18)
Troponin I (High Sensitivity): 96 ng/L — ABNORMAL HIGH (ref <18)

## 2023-02-09 MED ORDER — CEPHALEXIN 500 MG PO CAPS
500.0000 mg | ORAL_CAPSULE | Freq: Once | ORAL | Status: AC
  Administered 2023-02-09: 500 mg via ORAL
  Filled 2023-02-09: qty 1

## 2023-02-09 MED ORDER — CEPHALEXIN 500 MG PO CAPS
500.0000 mg | ORAL_CAPSULE | Freq: Two times a day (BID) | ORAL | 0 refills | Status: AC
Start: 2023-02-09 — End: 2023-02-14

## 2023-02-09 MED ORDER — IOHEXOL 350 MG/ML SOLN
75.0000 mL | Freq: Once | INTRAVENOUS | Status: AC | PRN
  Administered 2023-02-09: 75 mL via INTRAVENOUS

## 2023-02-09 MED ORDER — POLYETHYLENE GLYCOL 3350 17 G PO PACK: 17.0000 g | PACK | Freq: Every day | ORAL | 0 refills | Status: DC

## 2023-02-09 MED ORDER — FENTANYL CITRATE PF 50 MCG/ML IJ SOSY
50.0000 ug | PREFILLED_SYRINGE | Freq: Once | INTRAMUSCULAR | Status: AC
  Administered 2023-02-09: 50 ug via INTRAVENOUS
  Filled 2023-02-09: qty 1

## 2023-02-09 NOTE — ED Provider Notes (Signed)
[place] [facility]ER[facility] DE[facility]MENT AT [facility] Provider Note   CSN: [id number] Arrival date & time: 02/09/23  1234     History  Chief Complaint  Patient presents with   Shortness of Breath    Dana [name] is a 82 y.o. female.  History of hypertrophic cardiomyopathy, CAD, CHF, anemia, rheumatoid arthritis.  Presents to ER complaining of left flank pain that is intermittent, worse with a deep breath and worse with lying down, also feels short of breath with it.  She also reports having pain in the left shoulder and left arm.  No numbness tingling or weakness, no leg swelling or pain, no hemoptysis, no productive cough, no fevers.  Symptoms started yesterday.  She is brought in by her son who translates.  Patient speaks Samoan, declines Orthoptist.  No injury or trauma.  No fever, she does have some mild dysuria feel similar to prior UTI  Shortness of Breath      Home Medications Prior to Admission medications   Medication Sig Start Date End Date Taking? Authorizing Provider  acetaminophen (TYLENOL) 325 MG tablet Take 2 tablets (650 mg total) by mouth every 6 (six) hours as needed for mild pain, fever or headache (or Fever >/= 101). 08/07/19  Yes Shon Hale, MD  alendronate (FOSAMAX) 70 MG tablet Take 70 mg by mouth every Wednesday. 03/15/19  Yes [provider]  ALLERGY RELIEF 10 MG tablet Take 10 mg by mouth daily. 07/26/19  Yes [provider]  allopurinol (ZYLOPRIM) 300 MG tablet Take 300 mg by mouth daily.   Yes [provider]  atorvastatin (LIPITOR) 10 MG tablet Take 10 mg by mouth daily.   Yes [provider]  cephALEXin (KEFLEX) 500 MG capsule Take 1 capsule (500 mg total) by mouth 2 (two) times daily for 5 days. 02/09/23 02/14/23 Yes Lane Kjos A, PA-C  ezetimibe (ZETIA) 10 MG tablet Take 10 mg by mouth daily.   Yes [provider]  ferrous sulfate 325 (65 FE) MG tablet Take 325 mg by mouth daily with  breakfast.   Yes [provider]  folic acid (FOLVITE) 1 MG tablet Take 1 mg by mouth daily. 07/26/19  Yes [provider]  furosemide (LASIX) 20 MG tablet Take 20 mg by mouth daily.   Yes [provider]  methotrexate 2.5 MG tablet Take 20 mg by mouth every Wednesday.    Yes [provider]  nitroGLYCERIN (NITROSTAT) 0.4 MG SL tablet PLACE 1 TABLET UNDER THE TONGUE EVERY 5 MINUTES AS NEEDED FOR CHEST PAIN Patient taking differently: Place 0.4 mg under the tongue every 5 (five) minutes as needed for chest pain. 03/05/21  Yes Jonelle Sidle, MD  pantoprazole (PROTONIX) 40 MG tablet Take 1 tablet (40 mg total) by mouth daily. 08/07/19  Yes Emokpae, Courage, MD  polyethylene glycol (MIRALAX) 17 g packet Take 17 g by mouth daily. 02/09/23  Yes Yasuko Lapage A, PA-C  sucralfate (CARAFATE) 1 g tablet Take 1 g by mouth 2 (two) times daily. 08/01/19  Yes [provider]  verapamil (VERELAN PM) 360 MG 24 hr capsule TAKE (1) CAPSULE BY MOUTH AT BEDTIME. 08/02/22  Yes Chandrasekhar, Mahesh A, MD  Vitamin D, Cholecalciferol, 25 MCG (1000 UT) TABS Take 1 tablet by mouth daily. 12/31/20  Yes [provider]  RESTASIS 0.05 % ophthalmic emulsion 1 drop 2 (two) times daily. 11/10/22   [provider]      Allergies    Patient has no  known allergies.    Review of Systems   Review of Systems  Respiratory:  Positive for shortness of breath.     Physical Exam Updated Vital Signs BP 125/69   Pulse 69   Temp 98.5 F (36.9 C) (Oral)   Resp (!) 25   Ht 5\' 2"  (1.575 m)   Wt 72.6 kg   SpO2 98%   BMI 29.26 kg/m  Physical Exam Vitals and nursing note reviewed.  Constitutional:      General: She is not in acute distress.    Appearance: She is well-developed.  HENT:     Head: Normocephalic and atraumatic.  Eyes:     Conjunctiva/sclera: Conjunctivae normal.  Cardiovascular:     Rate and Rhythm: Normal rate and regular rhythm.     Heart sounds:  Murmur heard.  Pulmonary:     Effort: Pulmonary effort is normal. No respiratory distress.     Breath sounds: Normal breath sounds.  Chest:     Chest wall: No crepitus.  Abdominal:     Palpations: Abdomen is soft.     Tenderness: There is no abdominal tenderness.  Musculoskeletal:        General: No swelling. Normal range of motion.     Cervical back: Neck supple.     Right lower leg: No tenderness. No edema.     Left lower leg: No tenderness. No edema.  Skin:    General: Skin is warm and dry.     Capillary Refill: Capillary refill takes less than 2 seconds.  Neurological:     General: No focal deficit present.     Mental Status: She is alert.  Psychiatric:        Mood and Affect: Mood normal.     ED Results / Procedures / Treatments   Labs (all labs ordered are listed, but only abnormal results are displayed) Labs Reviewed  BASIC METABOLIC PANEL - Abnormal; Notable for the following components:      Result Value   Glucose, Bld 145 (*)    Calcium 8.6 (*)    All other components within normal limits  CBC WITH DIFFERENTIAL/PLATELET - Abnormal; Notable for the following components:   WBC 3.3 (*)    RBC 3.39 (*)    Hemoglobin 11.5 (*)    HCT 33.4 (*)    RDW 15.8 (*)    Platelets 128 (*)    Lymphs Abs 0.6 (*)    All other components within normal limits  URINALYSIS, ROUTINE W REFLEX MICROSCOPIC - Abnormal; Notable for the following components:   Hgb urine dipstick SMALL (*)    Nitrite POSITIVE (*)    Bacteria, UA RARE (*)    All other components within normal limits  TROPONIN I (HIGH SENSITIVITY) - Abnormal; Notable for the following components:   Troponin I (High Sensitivity) 100 (*)    All other components within normal limits  TROPONIN I (HIGH SENSITIVITY) - Abnormal; Notable for the following components:   Troponin I (High Sensitivity) 96 (*)    All other components within normal limits  URINE CULTURE    EKG EKG Interpretation Date/Time:  Thursday February 09 2023 12:46:57 EDT Ventricular Rate:  76 PR Interval:  180 QRS Duration:  114 QT Interval:  434 QTC Calculation: 488 R Axis:   -10  Text Interpretation: Normal sinus rhythm Incomplete left bundle branch block Left ventricular hypertrophy with repolarization abnormality ( R in aVL , Cornell product , Romhilt-Estes )  similar to April 2024 Confirmed  by Pricilla Loveless [id number]) on 02/09/2023 12:52:00 PM  Radiology CT Angio Chest PE W and/or Wo Contrast  Result Date: 02/09/2023 CLINICAL DATA:  Shortness of breath, left flank pain since last night, PE suspected EXAM: CT ANGIOGRAPHY CHEST WITH CONTRAST TECHNIQUE: Multidetector CT imaging of the chest was performed using the standard protocol during bolus administration of intravenous contrast. Multiplanar CT image reconstructions and MIPs were obtained to evaluate the vascular anatomy. RADIATION DOSE REDUCTION: This exam was performed according to the departmental dose-optimization program which includes automated exposure control, adjustment of the mA and/or kV according to patient size and/or use of iterative reconstruction technique. CONTRAST:  75mL OMNIPAQUE IOHEXOL 350 MG/ML SOLN COMPARISON:  08/01/2021, 01/29/2017 FINDINGS: Cardiovascular: Satisfactory opacification of the pulmonary arteries to the segmental level. No evidence of pulmonary embolism. Cardiomegaly. Three-vessel coronary artery calcifications. Enlargement of the main pulmonary artery, measuring up to 3.7 cm in caliber. No pericardial effusion. Aortic atherosclerosis. Mediastinum/Nodes: Unchanged prominent mediastinal lymph nodes, as seen on examinations dating back to 2018, reactive and benign, requiring no further follow-up or characterization. Unchanged heterogeneously enlarged right lobe of the thyroid, as seen on prior examination dated 2018, benign, requiring no further follow-up or characterization. Trachea, and esophagus demonstrate no significant findings. Lungs/Pleura: Dependent  bibasilar scarring or atelectasis. Mosaic attenuation of the airspaces. No pleural effusion or pneumothorax. Upper Abdomen: Please see separately reported examination of the abdomen and pelvis. Musculoskeletal: No chest wall abnormality. No acute osseous findings. Review of the MIP images confirms the above findings. IMPRESSION: 1. Negative examination for pulmonary embolism. 2. Scarring or atelectasis of the bilateral lung bases. Mosaic attenuation of the airspaces, consistent with small airways disease. 3. Cardiomegaly and coronary artery disease. 4. Enlargement of the main pulmonary artery, as can be seen in pulmonary hypertension. Aortic Atherosclerosis (ICD10-I70.0). Electronically Signed   By: Jearld Lesch M.D.   On: 02/09/2023 18:34   CT ABDOMEN PELVIS WO CONTRAST  Result Date: 02/09/2023 CLINICAL DATA:  Left flank pain since last night EXAM: CT ABDOMEN AND PELVIS WITHOUT CONTRAST TECHNIQUE: Multidetector CT imaging of the abdomen and pelvis was performed following the standard protocol without IV contrast. RADIATION DOSE REDUCTION: This exam was performed according to the departmental dose-optimization program which includes automated exposure control, adjustment of the mA and/or kV according to patient size and/or use of iterative reconstruction technique. COMPARISON:  06/27/2018 FINDINGS: Lower chest: Please see separately reported examination of the chest. Hepatobiliary: No focal liver abnormality is seen. Status post cholecystectomy. Unchanged postoperative biliary ductal dilatation. Pancreas: Unremarkable. No pancreatic ductal dilatation or surrounding inflammatory changes. Spleen: Normal in size without significant abnormality. Adrenals/Urinary Tract: Adrenal glands are unremarkable. Kidneys are normal, without renal calculi, solid lesion, or hydronephrosis. Bladder is unremarkable. Stomach/Bowel: Stomach is within normal limits. Appendix not clearly visualized. No evidence of bowel wall  thickening, distention, or inflammatory changes. Pancolonic diverticulosis. Moderate burden of stool and stool balls throughout the colon and rectum. Vascular/Lymphatic: Aortic atherosclerosis. No enlarged abdominal or pelvic lymph nodes. Reproductive: Fluid in the endometrial cavity, abnormal in the postmenopausal setting although similar to prior examination (series 4, image 64, series 8, image 71). Other: No abdominal wall hernia or abnormality. No ascites. Fat stranding in the left upper quadrant adjacent to the spleen, of uncertain etiology (series 4, image 25). Musculoskeletal: No acute or significant osseous findings. IMPRESSION: 1. Fat stranding in the left upper quadrant adjacent to the spleen, of uncertain etiology. No noncontrast abnormality of the spleen itself appreciated. 2. No urinary tract calculus or hydronephrosis.  3. Fluid in the endometrial cavity, abnormal in the postmenopausal setting although similar to prior examination. Correlate with history of postmenopausal bleeding and consider further evaluation with pelvic ultrasound. 4. Pancolonic diverticulosis without evidence of acute diverticulitis. 5. Moderate burden of stool and stool balls throughout the colon and rectum. Aortic Atherosclerosis (ICD10-I70.0). Electronically Signed   By: Jearld Lesch M.D.   On: 02/09/2023 18:30   DG Chest 2 View  Result Date: 02/09/2023 CLINICAL DATA:  Shortness of breath EXAM: CHEST - 2 VIEW COMPARISON:  X-ray 10/15/2022 FINDINGS: Calcified aorta. No consolidation, pneumothorax or effusion. No edema normal cardiopericardial silhouette. Degenerative changes along the spine. IMPRESSION: No acute cardiopulmonary disease Electronically Signed   By: Karen Kays M.D.   On: 02/09/2023 15:00    Procedures Procedures    Medications Ordered in ED Medications  cephALEXin (KEFLEX) capsule 500 mg (has no administration in time range)  fentaNYL (SUBLIMAZE) injection 50 mcg (50 mcg Intravenous Given 02/09/23  1602)  iohexol (OMNIPAQUE) 350 MG/ML injection 75 mL (75 mLs Intravenous Contrast Given 02/09/23 1643)    ED Course/ Medical Decision Making/ A&P Clinical Course as of 02/09/23 1911  Thu Feb 09, 2023  1418 Here with chest and flank pain, history of heart disease though had reassuring recent cath, hokum, RA and anemia.  First troponin is 100 but that is much less than her past several troponins.  She is having pleuritic pain as well and is on calcium channel blockers which would blunt a tachycardic swelling so we will scan for PE and also include abdomen pelvis to rule out kidney stone as possible cause that she does have some CVA tenderness as well.  Waiting on repeat troponin, remainder of labs. [CB]    Clinical Course User Index [CB] Ma Rings, PA-C                                 Medical Decision Making This patient presents to the ED for concern of flank pain left chest pain worse with deep inspiration, this involves an extensive number of treatment options, and is a complaint that carries with it a high risk of complications and morbidity.  The differential diagnosis includes PE, ACS, kidney stone, nephritis, UTI, trauma, other   Co morbidities that complicate the patient evaluation :   Hokum, CAD   Additional history obtained:  Additional history obtained from EMR External records from outside source obtained and reviewed including notes, labs   Lab Tests:  I Ordered, and personally interpreted labs.  The pertinent results include: Mild thrombocytopenia, mild leukopenia   Imaging Studies ordered:  I ordered imaging studies including chest x-ray which shows no pulmonary edema or infiltrate, no pneumothorax.  CT chest shows no PE or pneumonia  CT abdomen pelvis shows no renal stone or other acute finding I independently visualized and interpreted imaging within scope of identifying emergent findings  I agree with the radiologist interpretation Noted incidental  findings and these relayed to the patient.  Discussed the finding of nonspecific stranding adjacent to the spleen with Dr. Rande Lawman be.  He suggested potentially be admitted to trauma, patient denies trauma.  He stated if we need to further evaluate could consider contrasted CT exam.  He had no further considerations for differential at this time  Cardiac Monitoring: / EKG:  The patient was maintained on a cardiac monitor.  I personally viewed and interpreted the cardiac monitored which showed an  underlying rhythm of: Sinus rhythm     Problem List / ED Course / Critical interventions / Medication management  Left chest and flank pain-somewhat reproducible with no skin changes, patient having some urinary symptoms, treating UTI due to positive nitrate and rare bacteria due to her symptoms.  Urine sent for culture. CTA chest ordered to make sure there is no PE or other intrathoracic cause of her pleuritic pain in this has incidental findings related to family but no acute emergent findings. Pelvis also ordered to see if there is possibly a renal stone, no renal stone but nonspecific ending around the spleen.  As above discussed with the radiologist who suggested possible contrast to study for further evaluation if needed.  Patient's pain is well-controlled I offered this contrast study and they declined.  They would like to go home, patient's other child has already picked up her antibiotics for her UTI and they run to follow-up with her doctor.  They are going to call tomorrow to schedule appointment.  They state if she starts having worse pain they will bring her back.  Patient sitting in the bed smiling, stating she feels much better.    I ordered medication including fentanyl for pain Reevaluation of the patient after these medicines showed that the patient resolved I have reviewed the patients home medicines and have made adjustments as needed      Amount and/or Complexity of Data  Reviewed Labs: ordered. Radiology: ordered.  Risk Prescription drug management.           Final Clinical Impression(s) / ED Diagnoses Final diagnoses:  Urinary tract infection without hematuria, site unspecified  Flank pain    Rx / DC Orders ED Discharge Orders          Ordered    cephALEXin (KEFLEX) 500 MG capsule  2 times daily        02/09/23 1711    polyethylene glycol (MIRALAX) 17 g packet  Daily        02/09/23 1905              Josem Kaufmann 02/09/23 1912    Pricilla Loveless, MD 02/10/23 [phone]

## 2023-02-09 NOTE — ED Notes (Signed)
Pt ambulated to bathroom. Denies any pain at this time.

## 2023-02-09 NOTE — ED Notes (Signed)
Administered 2L of O2 via [place]  RR 23 SPO2 96%

## 2023-02-09 NOTE — ED Notes (Signed)
Pt back from CT

## 2023-02-09 NOTE — ED Notes (Addendum)
Pt states, " SOB started yesterday evening while she was resting and it comes and goes. I have pain on the left side from shoulder to side. Pain was a 7/10."

## 2023-02-09 NOTE — ED Notes (Signed)
Pt resting in room with family.

## 2023-02-09 NOTE — ED Triage Notes (Signed)
Samoan speaking pt presents with SOB and left flank pain rated 7/10 that started last night, pt's son speaks [place].

## 2023-02-09 NOTE — Discharge Instructions (Addendum)
Taking care of you you were seen for pain on the left side today.  Your test for your heart were reassuring.  You do have a urinary tract infection were given antibiotics for this, your kidney function is normal.  The CT scan of your chest that did not show any blood clots or pneumonia.  Does show some enlargement of your pulmonary artery.  This is something to follow-up with your doctor to manage up outpatient with your heart doctor or a lung doctor.  CT scan of your abdomen shows some constipation.  I am prescribing MiraLAX to start taking daily.  Also shows some fluid in your uterus and needs to have follow-up ultrasound as an outpatient with primary care doctor.  Blood work did show slightly low platelets and slightly low white blood cell count-your doctor will need to recheck this.  There was nonspecific inflammation near your spleen.  As discussed we offered her repeat CT scan with some IV contrast to see if we could further evaluate this.  Since you are feeling better and the rest of your workup is reassuring we have decided to not do this at this time.  Again follow-up with your primary care doctor if you continue having pain you will need some more imaging.  If you have new or worsening symptoms you come back to the ER right away.

## 2023-02-12 LAB — URINE CULTURE: Culture: >=100000 — AB

## 2023-02-13 ENCOUNTER — Telehealth (HOSPITAL_BASED_OUTPATIENT_CLINIC_OR_DEPARTMENT_OTHER): Payer: Self-pay | Admitting: *Deleted

## 2023-02-13 NOTE — Telephone Encounter (Signed)
Post ED Visit - Positive Culture Follow-up  Culture report reviewed by antimicrobial stewardship pharmacist: [facility] []  Enzo Bi, Pharm.D. []  Celedonio Miyamoto, Pharm.D., BCPS AQ-ID []  Garvin Fila, Pharm.D., BCPS []  Georgina Pillion, Pharm.D., BCPS []  [place], [address].D., BCPS, AAHIVP []  Estella Husk, Pharm.D., BCPS, AAHIVP []  Lysle Pearl, PharmD, BCPS []  Phillips Climes, PharmD, BCPS []  Agapito Games, PharmD, BCPS []  Verlan Friends, PharmD []  Mervyn Gay, PharmD, BCPS []  Vinnie Level, PharmD  [facility] []  Len Childs, PharmD []  Greer Pickerel, PharmD []  Adalberto Cole, PharmD []  Perlie Gold, Rph []  Lonell Face) Jean Rosenthal, PharmD []  Earl Many, PharmD []  Junita Push, PharmD []  Dorna Leitz, PharmD []  Terrilee Files, PharmD []  Lynann Beaver, PharmD []  Keturah Barre, PharmD []  Loralee Pacas, PharmD []  Bernadene Person, PharmD   Positive urine culture Treated with Cephalexin, organism sensitive to the same and no further patient follow-up is required at this time.  Lora Paula, PharmD  Virl Axe Talley 02/13/2023, 9:56 AM

## 2023-04-13 ENCOUNTER — Other Ambulatory Visit (HOSPITAL_COMMUNITY): Payer: Self-pay | Admitting: Family Medicine

## 2023-04-13 DIAGNOSIS — Z1231 Encounter for screening mammogram for malignant neoplasm of breast: Secondary | ICD-10-CM

## 2023-06-01 ENCOUNTER — Ambulatory Visit (HOSPITAL_COMMUNITY)
Admission: RE | Admit: 2023-06-01 | Discharge: 2023-06-01 | Disposition: A | Discharge status: Home / Self Care | Payer: Medicare Other | Source: Ambulatory Visit | Attending: Family Medicine | Admitting: Family Medicine

## 2023-06-01 DIAGNOSIS — Z1231 Encounter for screening mammogram for malignant neoplasm of breast: Secondary | ICD-10-CM | POA: Insufficient documentation

## 2023-06-22 ENCOUNTER — Encounter: Payer: Self-pay | Admitting: Internal Medicine

## 2023-06-22 ENCOUNTER — Ambulatory Visit: Payer: Medicare Other | Attending: Internal Medicine

## 2023-06-22 ENCOUNTER — Ambulatory Visit: Payer: Medicare Other | Attending: Internal Medicine | Admitting: Internal Medicine

## 2023-06-22 VITALS — BP 120/70 | HR 66 | Resp 16 | Ht 62.0 in | Wt 169.0 lb

## 2023-06-22 DIAGNOSIS — K2970 Gastritis, unspecified, without bleeding: Secondary | ICD-10-CM | POA: Diagnosis present

## 2023-06-22 DIAGNOSIS — I421 Obstructive hypertrophic cardiomyopathy: Secondary | ICD-10-CM | POA: Diagnosis present

## 2023-06-22 DIAGNOSIS — I471 Supraventricular tachycardia, unspecified: Secondary | ICD-10-CM | POA: Diagnosis present

## 2023-06-22 DIAGNOSIS — I10 Essential (primary) hypertension: Secondary | ICD-10-CM | POA: Diagnosis present

## 2023-06-22 DIAGNOSIS — E782 Mixed hyperlipidemia: Secondary | ICD-10-CM | POA: Diagnosis present

## 2023-06-22 DIAGNOSIS — K2990 Gastroduodenitis, unspecified, without bleeding: Secondary | ICD-10-CM | POA: Diagnosis present

## 2023-06-22 DIAGNOSIS — I25118 Atherosclerotic heart disease of native coronary artery with other forms of angina pectoris: Secondary | ICD-10-CM

## 2023-06-22 DIAGNOSIS — D500 Iron deficiency anemia secondary to blood loss (chronic): Secondary | ICD-10-CM | POA: Diagnosis present

## 2023-06-22 DIAGNOSIS — I340 Nonrheumatic mitral (valve) insufficiency: Secondary | ICD-10-CM

## 2023-06-22 NOTE — Patient Instructions (Addendum)
Medication Dana:  Your physician recommends that you continue on your current medications as directed. Please refer to the Current Medication list given to you today. Furosemide 20 mg by mouth every other day *If you need a refill on your cardiac medications before your next appointment, please call your pharmacy*   Lab Work: NONE If you have labs (blood work) drawn today and your tests are completely normal, you will receive your results only by: MyChart Message (if you have MyChart) OR A paper copy in the mail If you have any lab test that is abnormal or we need to change your treatment, we will call you to review the results.   Testing/Procedures: NOV 2025- - -Your physician has requested that you have an echocardiogram. Echocardiography is a painless test that uses sound waves to create images of your heart. It provides your doctor with information about the size and shape of your heart and how well your heart's chambers and valves are working. This procedure takes approximately one hour. There are no restrictions for this procedure. Please do NOT wear cologne, perfume, aftershave, or lotions (deodorant is allowed). Please arrive 15 minutes prior to your appointment time.  Please note: We ask at that you not bring children with you during ultrasound (echo/ vascular) testing. Due to room size and safety concerns, children are not allowed in the ultrasound rooms during exams. Our front office staff cannot provide observation of children in our lobby area while testing is being conducted. An adult accompanying a patient to their appointment will only be allowed in the ultrasound room at the discretion of the ultrasound technician under special circumstances. We apologize for any inconvenience.   Your physician has requested that you wear a heart monitor.    Follow-Up: At [facility], you and your health needs are our priority.  As part of our continuing mission to provide  you with exceptional heart care, we have created designated Provider Care Teams.  These Care Teams include your primary Cardiologist (physician) and Advanced Practice Providers (APPs -  Physician Assistants and Nurse Practitioners) who all work together to provide you with the care you need, when you need it.  We recommend signing up for the patient portal called "MyChart".  Sign up information is provided on this After Visit Summary.  MyChart is used to connect with patients for Virtual Visits (Telemedicine).  Patients are able to view lab/test results, encounter notes, upcoming appointments, etc.  Non-urgent messages can be sent to your provider as well.   To learn more about what you can do with MyChart, go to [email or web address].    Your next appointment:   1 year(s)  Provider:   Christell Constant, MD     Other Dana Dana  Your physician has requested you wear a ZIO patch monitor for 7 days.  This is a single patch monitor. [employer] supplies one patch monitor per enrollment. Additional stickers are not available. Please do not apply patch if you will be having a Nuclear Stress Test,   Cardiac CT, MRI, or Chest Xray during the period you would be wearing the  monitor. The patch cannot be worn during these tests. You cannot remove and re-apply the  ZIO XT patch monitor.  Your ZIO patch monitor will be mailed 3 day USPS to your address on file. It may take 3-5 days  to receive your monitor after you have been enrolled.  Once you have received your monitor, please review  the enclosed Dana. Your monitor  has already been registered assigning a specific monitor serial # to you.  Billing and Patient Assistance Program Information  We have supplied [employer] with any of your insurance information on file for billing purposes. [employer] offers a sliding scale Patient Assistance Program for patients that do not have  insurance, or whose  insurance does not completely cover the cost of the ZIO monitor.  You must apply for the Patient Assistance Program to qualify for this discounted rate.  To apply, please call [employer] at [phone], select option 4, select option 2, ask to apply for  Patient Assistance Program. Dana Dana will ask your household income, and how many people  are in your household. They will quote your out-of-pocket cost based on that information.  [employer] will also be able to set up a 42-month, interest-free payment plan if needed.  Applying the monitor   Shave hair from upper left chest.  Hold abrader disc by orange tab. Rub abrader in 40 strokes over the upper left chest as  indicated in your monitor Dana.  Clean area with 4 enclosed alcohol pads. Let dry.  Apply patch as indicated in monitor Dana. Patch will be placed under collarbone on left  side of chest with arrow pointing upward.  Rub patch adhesive wings for 2 minutes. Remove white label marked "1". Remove the white  label marked "2". Rub patch adhesive wings for 2 additional minutes.  While looking in a mirror, press and release button in center of patch. A small green light will  flash 3-4 times. This will be your only indicator that the monitor has been turned on.  Do not shower for the first 24 hours. You may shower after the first 24 hours.  Press the button if you feel a symptom. You will hear a small click. Record Date, Time and  Symptom in the Patient Logbook.  When you are ready to remove the patch, follow Dana on the last 2 pages of Patient  Logbook. Stick patch monitor onto the last page of Patient Logbook.  Place Patient Logbook in the blue and white box. Use locking tab on box and tape box closed  securely. The blue and white box has prepaid postage on it. Please place it in the mailbox as  soon as possible. Your physician should have your test results approximately 7 days after the  monitor has been mailed back  to [facility].  Call [facility] Customer Care at [phone] if you have questions regarding  your ZIO XT patch monitor. Call them immediately if you see an orange light blinking on your  monitor.  If your monitor falls off in less than 4 days, contact our Monitor department at [phone].  If your monitor becomes loose or falls off after 4 days call [employer] at [phone] for  suggestions on securing your monitor

## 2023-06-22 NOTE — Progress Notes (Signed)
Cardiology Office Note:    Date:  06/22/2023   ID:  Dana [name], DOB [birth year], MRN [id number]  PCP:  Kara Pacer, NP   [facility] Providers Cardiologist:  Christell Constant, MD     Referring MD: Katherine Basset*   CC:  Follow up oHCM  History of Present Illness:    Dana [name] is a 82 y.o. female with a hx of CAD s/p PCI OM1 and HOCM with preserved LVEF and LVOT gradient of 100 mmHg.   2023: started AV nodal therapy with improvement-> now no symptoms, deferred genetic testing.  Was seen in [place]: once for chest pain and once for shortness of breath requiring increased diuretic. 2024: NSTEMI eval related to GI bleed  Interpretor service defer today.  Patient asks to use son and daugther.  Dana [name] an 5 year old with a history of coronary artery disease post PCI to the OM1, hypertrophic obstructive cardiomyopathy, and a history of GI bleed and iron deficiency anemia, presents with mild symptoms despite maximized dose of highest tolerated avian nodal agent use. At the last visit, the patient was experiencing dyspnea on exertion and was found to be iron deficient. Despite receiving IV iron, the patient reports no significant change in symptoms.  The patient denies experiencing chest pain, shortness of breath, palpitations, dizziness, near syncope, or fatigue. However, she does report dizziness upon exertion. The patient's family has noted that she experiences chest pain when she forgets to take her medications.  The patient has several children, not all of whom have been tested for hypertrophic obstructive cardiomyopathy. In June 2024, the patient deferred genetic testing and cardiac MRI.  The patient also has a history of paroxysmal SVT and coronary artery disease with stable angina. She had a heart catheterization in 2024 that revealed patent stents. The patient's care for her history of GI bleed and iron deficiency anemia is being transferred  to her primary care nurse practitioner.  The patient is currently on atorvastatin, Zetia, Lasix 20mg , PRN nitro, and verapamil 360mg . The patient's family reports that she experiences chest pain when she does not take her Lasix, which she takes 20mg  of every other day.  Her pill packs were reviewed today.   Past Medical History:  Diagnosis Date   CAD (coronary artery disease)    a. s/p DES to OM1 in 02/2017 b. patent stent by cath in 11/2022   Chest pain    Essential hypertension    Gout    HOCM (hypertrophic obstructive cardiomyopathy) (HCC)    Hyperlipidemia    Rheumatoid arthritis (HCC)     Past Surgical History:  Procedure Laterality Date   BIOPSY  10/20/2022   Procedure: BIOPSY;  Surgeon: Tressia Danas, MD;  Location: [facility] ENDOSCOPY;  Service: Gastroenterology;;   CHOLECYSTECTOMY N/A 08/17/2018   Procedure: LAPAROSCOPIC CHOLECYSTECTOMY;  Surgeon: [name], MD;  Location: [facility] ORS;  Service: General;  Laterality: N/A;   COLONOSCOPY WITH PROPOFOL N/A 10/20/2022   Procedure: COLONOSCOPY WITH PROPOFOL;  Surgeon: Tressia Danas, MD;  Location: [facility] ENDOSCOPY;  Service: Gastroenterology;  Laterality: N/A;   CORONARY BALLOON ANGIOPLASTY N/A 03/03/2017   Procedure: CORONARY BALLOON ANGIOPLASTY;  Surgeon: Marykay Lex, MD;  Location: [facility] INVASIVE CV LAB;  Service: Cardiovascular;  Laterality: N/A;   CORONARY STENT INTERVENTION N/A 03/03/2017   Procedure: CORONARY STENT INTERVENTION;  Surgeon: Marykay Lex, MD;  Location: [facility] INVASIVE CV LAB;  Service: Cardiovascular;  Laterality: N/A;   ESOPHAGOGASTRODUODENOSCOPY (EGD) WITH PROPOFOL N/A 10/20/2022  Procedure: ESOPHAGOGASTRODUODENOSCOPY (EGD) WITH PROPOFOL;  Surgeon: Tressia Danas, MD;  Location: [facility] ENDOSCOPY;  Service: Gastroenterology;  Laterality: N/A;   LEFT HEART CATH AND CORONARY ANGIOGRAPHY N/A 03/02/2017   Procedure: LEFT HEART CATH AND CORONARY ANGIOGRAPHY;  Surgeon: Tonny Bollman, MD;  Location: [facility] INVASIVE CV LAB;   Service: Cardiovascular;  Laterality: N/A;   LEFT HEART CATH AND CORONARY ANGIOGRAPHY N/A 12/08/2022   Procedure: LEFT HEART CATH AND CORONARY ANGIOGRAPHY;  Surgeon: Orbie Pyo, MD;  Location: [facility] INVASIVE CV LAB;  Service: Cardiovascular;  Laterality: N/A;   POLYPECTOMY  10/20/2022   Procedure: POLYPECTOMY;  Surgeon: Tressia Danas, MD;  Location: [facility] ENDOSCOPY;  Service: Gastroenterology;;    Current Medications: Current Meds  Medication Sig   acetaminophen (TYLENOL) 325 MG tablet Take 2 tablets (650 mg total) by mouth every 6 (six) hours as needed for mild pain, fever or headache (or Fever >/= 101).   alendronate (FOSAMAX) 70 MG tablet Take 70 mg by mouth every Wednesday.   ALLERGY RELIEF 10 MG tablet Take 10 mg by mouth daily.   allopurinol (ZYLOPRIM) 300 MG tablet Take 300 mg by mouth daily.   atorvastatin (LIPITOR) 10 MG tablet Take 10 mg by mouth daily.   ezetimibe (ZETIA) 10 MG tablet Take 10 mg by mouth daily.   ferrous sulfate 325 (65 FE) MG tablet Take 325 mg by mouth daily with breakfast.   folic acid (FOLVITE) 1 MG tablet Take 1 mg by mouth daily.   furosemide (LASIX) 20 MG tablet Take 20 mg by mouth every other day.   methotrexate 2.5 MG tablet Take 20 mg by mouth every Wednesday.    nitroGLYCERIN (NITROSTAT) 0.4 MG SL tablet PLACE 1 TABLET UNDER THE TONGUE EVERY 5 MINUTES AS NEEDED FOR CHEST PAIN (Patient taking differently: Place 0.4 mg under the tongue every 5 (five) minutes as needed for chest pain.)   pantoprazole (PROTONIX) 40 MG tablet Take 1 tablet (40 mg total) by mouth daily.   polyethylene glycol (MIRALAX) 17 g packet Take 17 g by mouth daily.   RESTASIS 0.05 % ophthalmic emulsion 1 drop 2 (two) times daily.   sucralfate (CARAFATE) 1 g tablet Take 1 g by mouth 2 (two) times daily.   verapamil (VERELAN PM) 360 MG 24 hr capsule TAKE (1) CAPSULE BY MOUTH AT BEDTIME.   Vitamin D, Cholecalciferol, 25 MCG (1000 UT) TABS Take 1 tablet by mouth daily.   Current  Facility-Administered Medications for the 06/22/23 encounter (Office Visit) with Christell Constant, MD  Medication   sodium chloride flush (NS) 0.9 % injection 3 mL     Allergies:   Patient has no known allergies.   Social History   Socioeconomic History   Marital status: Widowed    Spouse name: Not on file   Number of children: Not on file   Years of education: Not on file   Highest education level: Not on file  Occupational History   Not on file  Tobacco Use   Smoking status: Never   Smokeless tobacco: Never  Vaping Use   Vaping status: Never Used  Substance and Sexual Activity   Alcohol use: No   Drug use: No   Sexual activity: Not on file  Other Topics Concern   Not on file  Social History Narrative   Not on file   Social Drivers of Health   Financial Resource Strain: Not on file  Food Insecurity: No Food Insecurity (10/20/2022)   Hunger Vital Sign  Worried About Programme researcher, broadcasting/film/video in the Last Year: Never true    Ran Out of Food in the Last Year: Never true  Transportation Needs: No Transportation Needs (10/20/2022)   PRAPARE - Administrator, Civil Service (Medical): No    Lack of Transportation (Non-Medical): No  Physical Activity: Not on file  Stress: Not on file  Social Connections: Not on file    Social: One of her kids died from leukemia not HCM One Daughter (([place]- EMT) and four sons, one whom has moved here)  Family History: The patient's family history includes Alcohol abuse in her father; Hypertension in her mother; Liver disease in her mother.  ROS:   Please see the history of present illness.     EKGs/Labs/Other Studies Reviewed:    The following studies were reviewed today:   12/06/22: SR with LVH and secondary repol, anterior infarct pattern  Cardiac Studies & Procedures   CARDIAC CATHETERIZATION  CARDIAC CATHETERIZATION 03/02/2017  Narrative 1. Severe single-vessel coronary artery disease involving a large first  obtuse marginal branch of the left dominant circumflex 2. Moderate LAD stenosis 3. Small nondominant RCA with no significant obstruction 4. Mild to moderate left main stenosis 5. Known hypertrophic cardiomyopathy  Recommend PCI of the obtuse marginal. 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 The patient will be loaded with Plavix. Will schedule her PCI for tomorrow. Would use femoral artery access and consider either a 5 [place] guide catheter or a guide catheter with sideholes. Plan discussed at length with the patient's daughter. Note the patient is non-English speaking.  Findings Coronary Findings Diagnostic  Dominance: Left  Left Main  Left Anterior Descending  Left Circumflex  First Obtuse Marginal Branch  Lateral First Obtuse Marginal Branch  Right Coronary Artery There is mild the vessel.  Intervention  No interventions have been documented.   CARDIAC CATHETERIZATION  CARDIAC CATHETERIZATION 12/08/2022  Narrative   Ost LM lesion is 20% stenosed.   1st Mrg lesion is 10% stenosed.   Mid LAD lesion is 30% stenosed.  1.  Patent obtuse marginal stent with mild diffuse disease elsewhere. 2.  LVEDP of 21 mmHg. 3.  Upper extremity tortuosity requiring destination sheath for catheter engagement.  Recommendation: Medical therapy.  Findings Coronary Findings Diagnostic  Dominance: Left  Left Main Ost LM lesion is 20% stenosed.  Left Anterior Descending There is mild diffuse disease throughout the vessel. Mid LAD lesion is 30% stenosed.  Left Circumflex There is mild diffuse disease throughout the vessel.  First Obtuse Marginal Branch 1st Mrg lesion is 10% stenosed. The lesion was previously treated .  Right Coronary Artery The vessel exhibits minimal luminal irregularities.  Intervention  No interventions have been documented.   STRESS TESTS  NM PET CT CARDIAC PERFUSION MULTI W/ABSOLUTE BLOODFLOW  05/10/2022  Narrative   Findings are consistent with no prior ischemia and no prior myocardial infarction. The study is intermediate risk due to the presence of decreased global myocardial blood flow reserve which may indicate the presence of microvascular disease, however, cannot rule out underlying obstructive CAD given the presence of extensive coronary calcification. Could consider cath for further evaluation if clinically indicated.   LV perfusion is normal.   Rest left ventricular function is normal. Rest EF: 55 %. Stress left ventricular function is normal. Stress EF: 60 %. End diastolic cavity size is normal. End systolic cavity size is normal.   Myocardial blood flow  was computed to be 0.49ml/g/min at rest and 1.81ml/g/min at stress. Global myocardial blood flow reserve was 1.29 and was abnormal.   Coronary calcium was present on the attenuation correction CT images. Severe coronary calcifications were present. Coronary calcifications were present in the left anterior descending artery, left circumflex artery and right coronary artery distribution(s).  CLINICAL DATA:  This over-read does not include interpretation of cardiac or coronary anatomy or pathology. The cardiac PET CT evaluation interpretation by the cardiologist is attached.  COMPARISON:  August 01, 2021  FINDINGS: Vascular: Please see dedicated report for cardiovascular details. There is aortic atherosclerosis and coronary artery calcification.  Mediastinum/Nodes:  Patulous esophagus. Mediastinal lymphadenopathy.  (Image 13/2) 17 mm RIGHT paratracheal lymph node may measured as much as 15-16 mm in 2018. Top-normal lymph node in the AP window (image 10/2) 14 mm. 10 mm in 2023 and new since 2018.  Increase in number of smaller lymph nodes in the pre-vascular space and upper AP window. No gross hilar adenopathy.  Lungs/Pleura: Patchy basilar atelectasis, no signs of effusion or consolidation.  Upper Abdomen: No  acute upper abdominal process to the extent evaluated.  Musculoskeletal: No acute or destructive bone findings.  IMPRESSION: 1. Mediastinal nodal enlargement along the RIGHT paratracheal chain similar to February of 2023 and little changed compared to the study of August of 2018. New top-normal AP window lymph node size in generalized increase in number of lymph nodes in the chest. This finding can be seen in the setting of heart failure and should be correlated clinically. Given size of lymph nodes could consider 3 to six-month follow-up to ensure stability. 2. Aortic atherosclerosis and coronary artery calcification.  Aortic Atherosclerosis (ICD10-I70.0).   Electronically Signed By: Donzetta Kohut M.D. On: 05/10/2022 11:17  ECHOCARDIOGRAM  ECHOCARDIOGRAM LIMITED 10/18/2022  Narrative ECHOCARDIOGRAM LIMITED REPORT    Patient Name:   RESHA ISHII Date of Exam: 10/18/2022 Medical Rec #:  [id number]    Height:       62.0 in Accession #:    [id number]   Weight:       154.9 lb Date of Birth:  Mar 02, 1941    BSA:          1.715 m Patient Age:    81 years     BP:           123/63 mmHg Patient Gender: F            HR:           83 bpm. Exam Location:  Inpatient  Procedure: Limited Echo, Color Doppler, Cardiac Doppler and Intracardiac Opacification Agent  Indications:    Chest Pain  History:        Patient has prior history of Echocardiogram examinations, most recent 08/30/2022. Hypertrophic Cardiomyopathy, CAD, Mitral Valve Disease, Signs/Symptoms:Chest Pain; Risk Factors:Hypertension and Dyslipidemia. Elevated BNP and Troponin.  Sonographer:    Milbert Coulter Referring Phys: [id number] LAURA R INGOLD  IMPRESSIONS   1. There is asymmetric septal hypertrophy with typical HOCM pathophysiology, but there is also apical entrapment due to mid cavity obliteration. 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 No thrombus is seen. Left ventricular ejection fraction, by  estimation, is 70 to 75%. The left ventricle has hyperdynamic function. There is moderate asymmetric left ventricular hypertrophy. Left ventricular diastolic function could not be evaluated. 2. Right ventricular systolic function is normal. The right ventricular size is normal. 3. Left atrial size was moderately dilated. 4. There is severe systolic anterior motion of the  mitral leaflets. There is at least moderate, probably severe mitral insufficiency due to [facility].. Severe mitral valve regurgitation. 5. The right coronary cusp is severely calcified and immobile, but the other two cusps move well. The aortic outflow tract gradient appears to be largely due to dynamicsubvalvular obstruction due to [facility] and not due to valvular disease. Peak gradient at rest is approximately 65 mm Hg. The aortic valve is tricuspid. There is moderate calcification of the aortic valve. There is moderate thickening of the aortic valve. Aortic valve regurgitation is trivial.  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  FINDINGS Left Ventricle: There is asymmetric septal hypertrophy with typical HOCM pathophysiology, but there is also apical entrapment due to mid cavity obliteration. 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 No thrombus is seen. Left ventricular ejection fraction, by estimation, is 70 to 75%. The left ventricle has hyperdynamic function. Definity contrast agent was given IV to delineate the left ventricular endocardial borders. There is moderate asymmetric left ventricular hypertrophy. Left ventricular diastolic function could not be evaluated.  Right Ventricle: The right ventricular size is normal. No increase in right ventricular wall thickness. Right ventricular systolic function is normal.  Left Atrium: Left atrial size was moderately dilated.  Right Atrium: Right atrial size was  normal in size.  Pericardium: There is no evidence of pericardial effusion.  Mitral Valve: There is severe systolic anterior motion of the mitral leaflets. There is at least moderate, probably severe mitral insufficiency due to [facility]. Mild mitral annular calcification. Severe mitral valve regurgitation, with posteriorly-directed jet.  Tricuspid Valve: The tricuspid valve is not well visualized. Tricuspid valve regurgitation is mild.  Aortic Valve: The right coronary cusp is severely calcified and immobile, but the other two cusps move well. The aortic outflow tract gradient appears to be largely due to dynamicsubvalvular obstruction due to [facility] and not due to valvular disease. Peak gradient at rest is approximately 65 mm Hg. The aortic valve is tricuspid. There is moderate calcification of the aortic valve. There is moderate thickening of the aortic valve. Aortic valve regurgitation is trivial.  Pulmonic Valve: The pulmonic valve was not well visualized.  Aorta: The aortic root is normal in size and structure.  IAS/Shunts: The interatrial septum was not assessed.  Additional Comments: Spectral Doppler performed. Color Doppler performed.  LEFT VENTRICLE PLAX 2D LVIDd:         4.00 cm LVIDs:         1.55 cm LV PW:         1.40 cm LV IVS:        1.80 cm LVOT diam:     1.90 cm LVOT Area:     2.84 cm   LEFT ATRIUM         Index LA diam:    3.30 cm 1.92 cm/m TRICUSPID VALVE TR Peak grad:   31.3 mmHg TR Vmax:        279.84 cm/s  SHUNTS Systemic Diam: 1.90 cm  Mihai Croitoru MD Electronically signed by Thurmon Fair MD Signature Date/Time: 10/18/2022/2:13:06 PM    Final   MONITORS  LONG TERM MONITOR (3-14 DAYS) 11/17/2021  Narrative  Patient had a minimum heart rate of 37 bpm, maximum heart rate of 160 bpm, and average heart rate of 78 bpm.  Predominant underlying rhythm was sinus rhythm.  P-SVT 10 episodes lasting 10 beats at longest with a max rate of 160 bpm at  fastest.  Isolated PACs were rare (<  1.0%).  Isolated PVCs were rare (<1.0%).  Two nocturnal Wenckebach episodes noted.  No triggered and diary events.  No NSVT or AF, sent for  HCM.             Recent Labs: 10/15/2022: B Natriuretic Peptide 474.0 10/16/2022: ALT 13 10/20/2022: Magnesium 1.8 02/09/2023: BUN 18; Creatinine, Ser 0.76; Hemoglobin 11.5; Platelets 128; Potassium 3.5; Sodium 135  Recent Lipid Panel    Component Value Date/Time   CHOL 84 10/21/2022 0300   TRIG 65 10/21/2022 0300   HDL 27 (L) 10/21/2022 0300   CHOLHDL 3.1 10/21/2022 0300   VLDL 13 10/21/2022 0300   LDLCALC 44 10/21/2022 0300    Physical Exam:    VS:  BP 120/70 (BP Location: Left Arm, Patient Position: Sitting, Cuff Size: Normal)   Pulse 66   Resp 16   Ht 5\' 2"  (1.575 m)   Wt 169 lb (76.7 kg)   SpO2 94%   BMI 30.91 kg/m     Wt Readings from Last 3 Encounters:  06/22/23 169 lb (76.7 kg)  02/09/23 160 lb (72.6 kg)  01/25/23 160 lb 12.8 oz (72.9 kg)    Gen: no distress, elderly female   Neck: No JV Cardiac: No Rubs or Gallops, harsh systolic murmur worse with hand grip, RRR and +2 radial pulses Respiratory: Clear to auscultation bilaterally, normal effort, normal  respiratory rate GI: Soft, nontender, non-distended  MS: no edema;  moves all extremities Integument: Skin feels ok Neuro:  At time of evaluation, alert and oriented to person/place/time/situation  Psych: Normal affect, patient feels well  ASSESSMENT:    1. HOCM (hypertrophic obstructive cardiomyopathy) (HCC)   2. PSVT (paroxysmal supraventricular tachycardia) (HCC)   3. Coronary artery disease of native artery of native heart with stable angina pectoris (HCC)   4. Nonrheumatic mitral valve regurgitation   5. Essential hypertension   6. Gastritis and gastroduodenitis   7. Mixed hyperlipidemia   8. Iron deficiency anemia secondary to blood loss (chronic)     PLAN:    Obstructive Hypertrophic Cardiomyopathy  (HCM) Hypertension - with secondary SAM related MR - Elderly patient with obstructive HCM, NYHA class I-II, on verapamil 360 mg. Symptoms include exertional dizziness and with standing and occasional chest pain when medications are missed. Patient deferred genetic testing and cardiac MRI. Discussed septal reduction therapy (SRT) via alcohol septal ablation and cardiac myosin inhibitors (Mavacamten). Alcohol septal ablation involves inducing a controlled myocardial infarction to reduce muscle size, with risks including pacemaker requirement. Mavacamten requires monitoring every 4, 8, and 12 weeks initially, then every 12 weeks, and is reversible if adverse effects occur. Patient and family opted to defer aggressive treatments due to minimal symptoms and potential risks. Adjusted Lasix to every other day based on current symptoms and pill pack review. If needed, she can call in and we will trial returning to daily.  We discussed the fluidity of diuretics based on diet and external factors - Perform echocardiogram in 11 months - Screen family members with echocardiogram every 5 years - Monitor symptoms and consider Mavacamten if symptoms worsen - She is greater than 68 years old with no prior NSVT.  She has asked for a minimal medication strategy.  She is not interested in primary prevention ICD.  Will readdress if significant NSVT or if syncope - Adjust Lasix to every other day based on current symptoms and pill pack review  Paroxysmal Supraventricular Tachycardia (SVT) - Paroxysmal SVT. Will get one week non live ziopatch; limited room  for additional AV nodal therapy  Coronary Artery Disease (CAD) with Stable Angina - With HLD and aortic atherosclerosis  CAD, post-PCI to OM1, with well-managed angina. LDL at goal with atorvastatin and Zetia. Symptoms managed with PRN nitroglycerin (none needed)  - Continue atorvastatin and Zetia - Continue PRN nitroglycerin  Iron Deficiency Anemia Hx of GI  bleed Iron deficiency anemia secondary to GI bleed. Previously treated with IV iron. No significant change in symptoms. - Transfer care of this to her primary care nurse practitioner for ongoing management  General Health Maintenance Discussed the importance of family screening for hypertrophic cardiomyopathy. Deferred genetic testing and cardiac MRI. - Screen family members with echocardiogram every 5 years - Provide educational handout on hypertrophic cardiomyopathy screening  Follow-up - Schedule follow-up appointment in one year - Monitor for high-risk features and contact if symptoms worsen.          Medication Adjustments/Labs and Tests Ordered: Current medicines are reviewed at length with the patient today.  Concerns regarding medicines are outlined above.  Orders Placed This Encounter  Procedures   LONG TERM MONITOR (3-14 DAYS)   ECHOCARDIOGRAM COMPLETE   No orders of the defined types were placed in this encounter.   Patient Instructions  Medication Instructions:  Your physician recommends that you continue on your current medications as directed. Please refer to the Current Medication list given to you today. Furosemide 20 mg by mouth every other day *If you need a refill on your cardiac medications before your next appointment, please call your pharmacy*   Lab Work: NONE If you have labs (blood work) drawn today and your tests are completely normal, you will receive your results only by: MyChart Message (if you have MyChart) OR A paper copy in the mail If you have any lab test that is abnormal or we need to change your treatment, we will call you to review the results.   Testing/Procedures: NOV 2025- - -Your physician has requested that you have an echocardiogram. Echocardiography is a painless test that uses sound waves to create images of your heart. It provides your doctor with information about the size and shape of your heart and how well your heart's  chambers and valves are working. This procedure takes approximately one hour. There are no restrictions for this procedure. Please do NOT wear cologne, perfume, aftershave, or lotions (deodorant is allowed). Please arrive 15 minutes prior to your appointment time.  Please note: We ask at that you not bring children with you during ultrasound (echo/ vascular) testing. Due to room size and safety concerns, children are not allowed in the ultrasound rooms during exams. Our front office staff cannot provide observation of children in our lobby area while testing is being conducted. An adult accompanying a patient to their appointment will only be allowed in the ultrasound room at the discretion of the ultrasound technician under special circumstances. We apologize for any inconvenience.   Your physician has requested that you wear a heart monitor.    Follow-Up: At [facility], you and your health needs are our priority.  As part of our continuing mission to provide you with exceptional heart care, we have created designated Provider Care Teams.  These Care Teams include your primary Cardiologist (physician) and Advanced Practice Providers (APPs -  Physician Assistants and Nurse Practitioners) who all work together to provide you with the care you need, when you need it.  We recommend signing up for the patient portal called "MyChart".  Sign up  information is provided on this After Visit Summary.  MyChart is used to connect with patients for Virtual Visits (Telemedicine).  Patients are able to view lab/test results, encounter notes, upcoming appointments, etc.  Non-urgent messages can be sent to your provider as well.   To learn more about what you can do with MyChart, go to [email or web address].    Your next appointment:   1 year(s)  Provider:   Christell Constant, MD     Other Instructions Christena Deem- Long Term Monitor Instructions  Your physician has requested you wear a ZIO patch  monitor for 7 days.  This is a single patch monitor. [employer] supplies one patch monitor per enrollment. Additional stickers are not available. Please do not apply patch if you will be having a Nuclear Stress Test,   Cardiac CT, MRI, or Chest Xray during the period you would be wearing the  monitor. The patch cannot be worn during these tests. You cannot remove and re-apply the  ZIO XT patch monitor.  Your ZIO patch monitor will be mailed 3 day USPS to your address on file. It may take 3-5 days  to receive your monitor after you have been enrolled.  Once you have received your monitor, please review the enclosed instructions. Your monitor  has already been registered assigning a specific monitor serial # to you.  Billing and Patient Assistance Program Information  We have supplied [employer] with any of your insurance information on file for billing purposes. [employer] offers a sliding scale Patient Assistance Program for patients that do not have  insurance, or whose insurance does not completely cover the cost of the ZIO monitor.  You must apply for the Patient Assistance Program to qualify for this discounted rate.  To apply, please call [employer] at [phone], select option 4, select option 2, ask to apply for  Patient Assistance Program. Meredeth Ide will ask your household income, and how many people  are in your household. They will quote your out-of-pocket cost based on that information.  [employer] will also be able to set up a 23-month, interest-free payment plan if needed.  Applying the monitor   Shave hair from upper left chest.  Hold abrader disc by orange tab. Rub abrader in 40 strokes over the upper left chest as  indicated in your monitor instructions.  Clean area with 4 enclosed alcohol pads. Let dry.  Apply patch as indicated in monitor instructions. Patch will be placed under collarbone on left  side of chest with arrow pointing upward.  Rub patch adhesive wings for 2 minutes.  Remove white label marked "1". Remove the white  label marked "2". Rub patch adhesive wings for 2 additional minutes.  While looking in a mirror, press and release button in center of patch. A small green light will  flash 3-4 times. This will be your only indicator that the monitor has been turned on.  Do not shower for the first 24 hours. You may shower after the first 24 hours.  Press the button if you feel a symptom. You will hear a small click. Record Date, Time and  Symptom in the Patient Logbook.  When you are ready to remove the patch, follow instructions on the last 2 pages of Patient  Logbook. Stick patch monitor onto the last page of Patient Logbook.  Place Patient Logbook in the blue and white box. Use locking tab on box and tape box closed  securely. The blue and white box has prepaid postage on it.  Please place it in the mailbox as  soon as possible. Your physician should have your test results approximately 7 days after the  monitor has been mailed back to [facility].  Call [facility] Customer Care at [phone] if you have questions regarding  your ZIO XT patch monitor. Call them immediately if you see an orange light blinking on your  monitor.  If your monitor falls off in less than 4 days, contact our Monitor department at [phone].  If your monitor becomes loose or falls off after 4 days call [employer] at [phone] for  suggestions on securing your monitor         Signed, Christell Constant, MD  06/22/2023 9:16 AM    [facility]

## 2023-06-22 NOTE — Progress Notes (Unsigned)
Enrolled for [employer] to mail a ZIO XT long term holter monitor to the patients address on file.  

## 2023-06-26 ENCOUNTER — Telehealth: Payer: Self-pay | Admitting: Internal Medicine

## 2023-06-26 NOTE — Telephone Encounter (Signed)
Pt's son states that when pt's heart monitor arrived it was broken into pieces. He would like a c/b regarding whether a new can be ordered. Please advise

## 2023-06-26 NOTE — Telephone Encounter (Signed)
Spoke with son and he states monitor arrived broken. I advised for him to reach out to [facility] and send it back in the return box. He verbalized understanding

## 2023-06-29 ENCOUNTER — Inpatient Hospital Stay: Payer: Medicare Other

## 2023-06-30 ENCOUNTER — Other Ambulatory Visit: Payer: Self-pay | Admitting: Internal Medicine

## 2023-06-30 ENCOUNTER — Inpatient Hospital Stay: Payer: Medicare Other | Attending: Hematology

## 2023-06-30 DIAGNOSIS — D649 Anemia, unspecified: Secondary | ICD-10-CM

## 2023-06-30 DIAGNOSIS — D500 Iron deficiency anemia secondary to blood loss (chronic): Secondary | ICD-10-CM | POA: Insufficient documentation

## 2023-06-30 DIAGNOSIS — R7402 Elevation of levels of lactic acid dehydrogenase (LDH): Secondary | ICD-10-CM | POA: Insufficient documentation

## 2023-06-30 DIAGNOSIS — D72819 Decreased white blood cell count, unspecified: Secondary | ICD-10-CM | POA: Diagnosis not present

## 2023-06-30 LAB — COMPREHENSIVE METABOLIC PANEL
ALT: 12 U/L (ref 0–44)
AST: 20 U/L (ref 15–41)
Albumin: 3.6 g/dL (ref 3.5–5.0)
Alkaline Phosphatase: 77 U/L (ref 38–126)
Anion gap: 8 (ref 5–15)
BUN: 10 mg/dL (ref 8–23)
CO2: 25 mmol/L (ref 22–32)
Calcium: 8.9 mg/dL (ref 8.9–10.3)
Chloride: 105 mmol/L (ref 98–111)
Creatinine, Ser: 0.83 mg/dL (ref 0.44–1.00)
GFR, Estimated: >60 mL/min (ref >60)
Glucose, Bld: 101 mg/dL — ABNORMAL HIGH (ref 70–99)
Potassium: 3.5 mmol/L (ref 3.5–5.1)
Sodium: 138 mmol/L (ref 135–145)
Total Bilirubin: 1 mg/dL (ref 0.0–1.2)
Total Protein: 7.9 g/dL (ref 6.5–8.1)

## 2023-06-30 LAB — CBC WITH DIFFERENTIAL/PLATELET
Abs Immature Granulocytes: 0.01 10*3/uL (ref 0.00–0.07)
Basophils Absolute: 0 10*3/uL (ref 0.0–0.1)
Basophils Relative: 1 %
Eosinophils Absolute: 0.1 10*3/uL (ref 0.0–0.5)
Eosinophils Relative: 3 %
HCT: 38.7 % (ref 36.0–46.0)
Hemoglobin: 13.1 g/dL (ref 12.0–15.0)
Immature Granulocytes: 0 %
Lymphocytes Relative: 20 %
Lymphs Abs: 0.9 10*3/uL (ref 0.7–4.0)
MCH: 32.3 pg (ref 26.0–34.0)
MCHC: 33.9 g/dL (ref 30.0–36.0)
MCV: 95.6 fL (ref 80.0–100.0)
Monocytes Absolute: 0.2 10*3/uL (ref 0.1–1.0)
Monocytes Relative: 6 %
Neutro Abs: 3.1 10*3/uL (ref 1.7–7.7)
Neutrophils Relative %: 70 %
Platelets: 188 10*3/uL (ref 150–400)
RBC: 4.05 MIL/uL (ref 3.87–5.11)
RDW: 13.7 % (ref 11.5–15.5)
WBC: 4.4 10*3/uL (ref 4.0–10.5)
nRBC: 0 % (ref 0.0–0.2)

## 2023-06-30 LAB — VITAMIN B12: Vitamin B-12: 1007 pg/mL — ABNORMAL HIGH (ref 180–914)

## 2023-06-30 LAB — IRON AND TIBC
Iron: 46 ug/dL (ref 28–170)
Saturation Ratios: 17 % (ref 10.4–31.8)
TIBC: 275 ug/dL (ref 250–450)
UIBC: 229 ug/dL

## 2023-06-30 LAB — FERRITIN: Ferritin: 277 ng/mL (ref 11–307)

## 2023-06-30 LAB — FOLATE: Folate: 16.4 ng/mL (ref >5.9)

## 2023-07-03 ENCOUNTER — Emergency Department (HOSPITAL_COMMUNITY)
Admission: EM | Admit: 2023-07-03 | Discharge: 2023-07-03 | Disposition: A | Discharge status: Home / Self Care | Payer: Medicare Other | Attending: Student | Admitting: Student

## 2023-07-03 ENCOUNTER — Encounter (HOSPITAL_COMMUNITY): Payer: Self-pay | Admitting: Radiology

## 2023-07-03 ENCOUNTER — Emergency Department (HOSPITAL_COMMUNITY): Payer: Medicare Other

## 2023-07-03 ENCOUNTER — Other Ambulatory Visit: Payer: Self-pay

## 2023-07-03 DIAGNOSIS — R1012 Left upper quadrant pain: Secondary | ICD-10-CM | POA: Diagnosis not present

## 2023-07-03 DIAGNOSIS — R109 Unspecified abdominal pain: Secondary | ICD-10-CM | POA: Diagnosis present

## 2023-07-03 DIAGNOSIS — D72819 Decreased white blood cell count, unspecified: Secondary | ICD-10-CM | POA: Insufficient documentation

## 2023-07-03 LAB — CBC
HCT: 34.1 % — ABNORMAL LOW (ref 36.0–46.0)
Hemoglobin: 11.4 g/dL — ABNORMAL LOW (ref 12.0–15.0)
MCH: 32.2 pg (ref 26.0–34.0)
MCHC: 33.4 g/dL (ref 30.0–36.0)
MCV: 96.3 fL (ref 80.0–100.0)
Platelets: 155 K/uL (ref 150–400)
RBC: 3.54 MIL/uL — ABNORMAL LOW (ref 3.87–5.11)
RDW: 14.4 % (ref 11.5–15.5)
WBC: 3.8 K/uL — ABNORMAL LOW (ref 4.0–10.5)
nRBC: 0 % (ref 0.0–0.2)

## 2023-07-03 LAB — URINALYSIS, ROUTINE W REFLEX MICROSCOPIC
Bilirubin Urine: NEGATIVE
Glucose, UA: NEGATIVE mg/dL
Hgb urine dipstick: NEGATIVE
Ketones, ur: NEGATIVE mg/dL
Leukocytes,Ua: NEGATIVE
Nitrite: NEGATIVE
Protein, ur: NEGATIVE mg/dL
Specific Gravity, Urine: 1.009 (ref 1.005–1.030)
pH: 5 (ref 5.0–8.0)

## 2023-07-03 LAB — COMPREHENSIVE METABOLIC PANEL
ALT: 10 U/L (ref 0–44)
AST: 18 U/L (ref 15–41)
Albumin: 3.1 g/dL — ABNORMAL LOW (ref 3.5–5.0)
Alkaline Phosphatase: 62 U/L (ref 38–126)
Anion gap: 5 (ref 5–15)
BUN: 13 mg/dL (ref 8–23)
CO2: 24 mmol/L (ref 22–32)
Calcium: 8.8 mg/dL — ABNORMAL LOW (ref 8.9–10.3)
Chloride: 111 mmol/L (ref 98–111)
Creatinine, Ser: 0.72 mg/dL (ref 0.44–1.00)
GFR, Estimated: >60 mL/min (ref >60)
Glucose, Bld: 88 mg/dL (ref 70–99)
Potassium: 3.8 mmol/L (ref 3.5–5.1)
Sodium: 140 mmol/L (ref 135–145)
Total Bilirubin: 0.6 mg/dL (ref 0.0–1.2)
Total Protein: 6.8 g/dL (ref 6.5–8.1)

## 2023-07-03 LAB — METHYLMALONIC ACID, SERUM: Methylmalonic Acid, Quantitative: 281 nmol/L (ref 0–378)

## 2023-07-03 MED ORDER — IOHEXOL 300 MG/ML  SOLN
100.0000 mL | Freq: Once | INTRAMUSCULAR | Status: AC | PRN
  Administered 2023-07-03: 100 mL via INTRAVENOUS

## 2023-07-03 NOTE — ED Provider Notes (Signed)
 [place] EMERGENCY DEPARTMENT AT [facility] Provider Note   CSN: [id number] Arrival date & time: 07/03/23  1014     History  Chief Complaint  Patient presents with   Flank Pain    Pt in for flank pain that started 2 days ago, denies urinary symptoms, nausea and vomiting. Endorse dizziness.    Dana [name] is a 83 y.o. female.   Flank Pain   83 year old female presents emergency department with complaints of left-sided flank pain.  Reports symptoms beginning 2 days ago.  States the pain is somewhat intermittent in nature with no known exacerbating or relieving factors.  Denies any fever, chills, shortness of breath, urinary symptoms, change in bowel habits.  She is taken no medication for her symptoms.  Denies any cough, congestion, nausea, vomiting.  States she was seen in the past for similar symptoms in August and was diagnosed with urinary tract infection.  Patient states she is currently without pain.  States that she was having pain this morning prompting visit to the ER.  Past medical history significant for CAD, hypertension, hypertrophic obstructive cardiomyopathy, hyperlipidemia, RA, MR  Home Medications Prior to Admission medications   Medication Sig Start Date End Date Taking? Authorizing Provider  acetaminophen  (TYLENOL ) 325 MG tablet Take 2 tablets (650 mg total) by mouth every 6 (six) hours as needed for mild pain, fever or headache (or Fever >/= 101). 08/07/19   Pearlean Manus, MD  alendronate (FOSAMAX) 70 MG tablet Take 70 mg by mouth every Wednesday. 03/15/19   [provider]  ALLERGY RELIEF 10 MG tablet Take 10 mg by mouth daily. 07/26/19   [provider]  allopurinol  (ZYLOPRIM ) 300 MG tablet Take 300 mg by mouth daily.    [provider]  atorvastatin  (LIPITOR) 10 MG tablet Take 10 mg by mouth daily.    [provider]  ezetimibe  (ZETIA ) 10 MG tablet Take 10 mg by mouth daily.    [provider]  ferrous  sulfate 325 (65 FE) MG tablet Take 325 mg by mouth daily with breakfast.    [provider]  folic acid  (FOLVITE ) 1 MG tablet Take 1 mg by mouth daily. 07/26/19   [provider]  furosemide  (LASIX ) 20 MG tablet Take 20 mg by mouth every other day.    [provider]  methotrexate  2.5 MG tablet Take 20 mg by mouth every Wednesday.     [provider]  nitroGLYCERIN  (NITROSTAT ) 0.4 MG SL tablet PLACE 1 TABLET UNDER THE TONGUE EVERY 5 MINUTES AS NEEDED FOR CHEST PAIN Patient taking differently: Place 0.4 mg under the tongue every 5 (five) minutes as needed for chest pain. 03/05/21   Debera Jayson MATSU, MD  pantoprazole  (PROTONIX ) 40 MG tablet Take 1 tablet (40 mg total) by mouth daily. 08/07/19   Pearlean Manus, MD  polyethylene glycol (MIRALAX ) 17 g packet Take 17 g by mouth daily. 02/09/23   Suellen Cantor A, PA-C  RESTASIS  0.05 % ophthalmic emulsion 1 drop 2 (two) times daily. 11/10/22   [provider]  sucralfate  (CARAFATE ) 1 g tablet Take 1 g by mouth 2 (two) times daily. 08/01/19   [provider]  verapamil  (VERELAN ) 360 MG 24 hr capsule TAKE (1) CAPSULE BY MOUTH AT BEDTIME. 06/30/23   Chandrasekhar, Mahesh A, MD  Vitamin D, Cholecalciferol , 25 MCG (1000 UT) TABS Take 1 tablet by mouth daily. 12/31/20   [provider]      Allergies    Patient has  no known allergies.    Review of Systems   Review of Systems  Genitourinary:  Positive for flank pain.  All other systems reviewed and are negative.   Physical Exam Updated Vital Signs BP (!) 140/65   Pulse 79   Temp 97.9 F (36.6 C) (Oral)   Resp 18   Ht 5' 2 (1.575 m)   Wt 76.7 kg   SpO2 97%   BMI 30.91 kg/m  Physical Exam Vitals and nursing note reviewed.  Constitutional:      General: She is not in acute distress.    Appearance: She is well-developed.  HENT:     Head: Normocephalic and atraumatic.  Eyes:     Conjunctiva/sclera: Conjunctivae normal.  Cardiovascular:      Rate and Rhythm: Normal rate and regular rhythm.  Pulmonary:     Effort: Pulmonary effort is normal. No respiratory distress.     Breath sounds: Normal breath sounds. No wheezing, rhonchi or rales.     Comments: No chest wall tenderness. Abdominal:     Palpations: Abdomen is soft.     Tenderness: There is abdominal tenderness.     Comments: Left lateral abdomen tenderness with slight tenderness left upper quadrant..  No obvious CVA tenderness bilaterally.  No overlying rash, erythema, palpable fluctuance/induration.  No obvious left lower rib tenderness.  Musculoskeletal:        General: No swelling.     Cervical back: Neck supple.  Skin:    General: Skin is warm and dry.     Capillary Refill: Capillary refill takes less than 2 seconds.  Neurological:     Mental Status: She is alert.  Psychiatric:        Mood and Affect: Mood normal.     ED Results / Procedures / Treatments   Labs (all labs ordered are listed, but only abnormal results are displayed) Labs Reviewed  COMPREHENSIVE METABOLIC PANEL - Abnormal; Notable for the following components:      Result Value   Calcium  8.8 (*)    Albumin 3.1 (*)    All other components within normal limits  CBC - Abnormal; Notable for the following components:   WBC 3.8 (*)    RBC 3.54 (*)    Hemoglobin 11.4 (*)    HCT 34.1 (*)    All other components within normal limits  URINALYSIS, ROUTINE W REFLEX MICROSCOPIC    EKG None  Radiology CT ABDOMEN PELVIS W CONTRAST Result Date: 07/03/2023 CLINICAL DATA:  Flank pain. EXAM: CT ABDOMEN AND PELVIS WITH CONTRAST TECHNIQUE: Multidetector CT imaging of the abdomen and pelvis was performed using the standard protocol following bolus administration of intravenous contrast. RADIATION DOSE REDUCTION: This exam was performed according to the departmental dose-optimization program which includes automated exposure control, adjustment of the mA and/or kV according to patient size and/or use of  iterative reconstruction technique. CONTRAST:  OMNIPAQUE  IOHEXOL  300 MG/ML  SOLN COMPARISON:  CT of the abdomen and pelvis without contrast on 02/09/2023 FINDINGS: Lower chest: No acute abnormality. Hepatobiliary: Stable and normal appearance of the liver. Status post cholecystectomy. Stable dilatation of the common bile duct after prior cholecystectomy. Pancreas: Unremarkable. No pancreatic ductal dilatation or surrounding inflammatory changes. Spleen: New small subcapsular fluid collection of the posterior spleen measuring up to approximately 2.7 x 1.8 x 3.8 cm. Associated mild amount of residual stranding along the inferior tip of the spleen. The subcapsular collection is of fairly low density and most likely etiology is the residua of focal  hemorrhage. On the prior CT, there was some stranding along the inferior and posterior aspect of the spleen and this likely represented a small amount of hemorrhage at that time which has likely coalesced into a small chronic fluid collection. Adrenals/Urinary Tract: Adrenal glands are unremarkable. Kidneys are normal, without renal calculi, focal lesion, or hydronephrosis. Bladder is unremarkable. Stomach/Bowel: Bowel shows no evidence of obstruction, ileus, inflammation or lesion. The appendix is not visualized. No free intraperitoneal air. Vascular/Lymphatic: Atherosclerosis of the abdominal aorta without aneurysm. No lymphadenopathy identified. Reproductive: Small amount of fluid in the endometrial cavity. Correlation suggested with any history of postmenopausal bleeding. This may require further elective gynecologic workup to exclude endometrial malignancy. Evidence of vaginal prolapse at the pelvic floor. Other: No abdominal wall hernia or abnormality. No abdominopelvic ascites. Musculoskeletal: No acute or significant osseous findings. IMPRESSION: 1. New small subcapsular fluid collection of the posterior spleen measuring up to approximately 2.7 x 1.8 x 3.8 cm.  Associated mild amount of residual stranding along the inferior tip of the spleen. The subcapsular collection is of fairly low density and most likely etiology is the residua of focal hemorrhage. On the prior CT, there was some stranding along the inferior and posterior aspect of the spleen and this likely represented a small amount of hemorrhage at that time which has likely coalesced into a small chronic fluid collection. 2. Small amount of fluid in the endometrial cavity. Correlation suggested with any history of postmenopausal bleeding. This may require further elective gynecologic workup to exclude endometrial malignancy. Correlation with elective pelvic ultrasound may be helpful. 3. Evidence of vaginal prolapse at the pelvic floor. 4. Aortic atherosclerosis. Aortic Atherosclerosis (ICD10-I70.0). Electronically Signed   By: Marcey Moan M.D.   On: 07/03/2023 14:27    Procedures Procedures    Medications Ordered in ED Medications  iohexol  (OMNIPAQUE ) 300 MG/ML solution 100 mL (100 mLs Intravenous Contrast Given 07/03/23 1251)    ED Course/ Medical Decision Making/ A&P Clinical Course as of 07/03/23 1545  Mon Jul 03, 2023  1521 Consulted general surgery Dr. Evonnie regarding the patient who recommended outpatient follow-up with PCP.  No emergent need for splenectomy at this time.  Will treat pain with Tylenol . [CR]    Clinical Course User Index [CR] Silver Wonda LABOR, PA                                 Medical Decision Making Amount and/or Complexity of Data Reviewed Labs: ordered. Radiology: ordered.   This patient presents to the ED for concern of flank pain, this involves an extensive number of treatment options, and is a complaint that carries with it a high risk of complications and morbidity.  The differential diagnosis includes pyelonephritis, nephrolithiasis, mesenteric ischemia, aortic dissection, cystitis, musculoskeletal, gastritis, PUD, cholecystitis, CBD pathology,  SBO/LBO, volvulus, diverticulitis, other   Co morbidities that complicate the patient evaluation  See HPI   Additional history obtained:  Additional history obtained from EMR External records from outside source obtained and reviewed including hospital records   Lab Tests:  I Ordered, and personally interpreted labs.  The pertinent results include: Leukopenia of 3.8.  Anemia with a hemoglobin of 11.4.  Platelets within normal range.  Mild hypocalcemia of 8.8 but otherwise, lecture lites within normal limits.  No transaminitis.  No renal dysfunction.  UA without abnormality.   Imaging Studies ordered:  I ordered imaging studies including CT abdomen pelvis I independently  visualized and interpreted imaging which showed subcapsular fluid collection posterior spleen 2.7 x 1.8 x 3.8 cm.  Residual stranding inferior to the spleen.  Small fluid collection in endometrial cavity.  Vaginal prolapse.  Aortic atherosclerosis. I agree with the radiologist interpretation  Cardiac Monitoring: / EKG:  The patient was maintained on a cardiac monitor.  I personally viewed and interpreted the cardiac monitored which showed an underlying rhythm of: Sinus rhythm   Consultations Obtained:  See ED course  Problem List / ED Course / Critical interventions / Medication management  Left flank pain Reevaluation of the patient showed that the patient stayed the same I have reviewed the patients home medicines and have made adjustments as needed   Social Determinants of Health:  Denies tobacco, licit drug use   Test / Admission - Considered:  Left flank pain Vitals signs significant for hypertension blood pressure 140/65. Otherwise within normal range and stable throughout visit. Laboratory/imaging studies significant for: See above 83 year old female presents emergency department with left-sided flank pain intermittent over the past couple of days.  On exam, tenderness noted left lateral  abdomen as well as left upper quadrant.  Labs concerning for leukopenia, anemia of which is baseline for patient and she follows with oncology in the outpatient setting.  CT imaging concerning for subcapsular fluid of the spleen as well as fluid collection and stranding of inferior spleen.  No reported traumatic injury.  Consulted general surgery regarding the patient who suspects imaging findings more chronic than acute in nature.  No emergent indication for splenectomy at this time.  Will recommend follow-up with primary care in the outpatient setting and treatment of pain at home with Tylenol .  CT imaging also with some free fluid in the endometrial cavity; will recommend follow-up with OB/GYN in the outpatient setting.  Strict return precautions discussed at length.  Treatment plan discussed at length with patient and family and they acknowledge understanding were agreeable to said plan.  Patient overall well-appearing, afebrile in no acute distress. Worrisome signs and symptoms were discussed with the patient, and the patient acknowledged understanding to return to the ED if noticed. Patient was stable upon discharge.          Final Clinical Impression(s) / ED Diagnoses Final diagnoses:  Left flank pain    Rx / DC Orders ED Discharge Orders     None         Silver Wonda LABOR, [place] 07/03/23 1545    Albertina Dixon, MD 07/03/23 2153

## 2023-07-03 NOTE — ED Triage Notes (Signed)
 Pt in for flank pain that started 2 days ago, denies urinary symptoms, nausea and vomiting. Endorse dizziness.

## 2023-07-03 NOTE — Discharge Instructions (Addendum)
 As discussed, you do have small fluid collection around your spleen concerning for an old splenic bleed.  I have talked to the surgeon regarding this who recommended follow-up with your primary care.  They will continually reassess this area.  No need for emergent spleen surgery at this time.  You may take Tylenol  for pain.  Please do not hesitate to return if the worrisome signs and symptoms we discussed to become apparent.

## 2023-07-05 NOTE — Progress Notes (Unsigned)
 PCP:  Benjamin Raina Elizabeth, NP [address] [place] [address] [phone]  REASON FOR VISIT:  Follow-up for evaluation of mild leukopenia  PRIOR THERAPY: None  CURRENT THERAPY: Surveillance  INTERVAL HISTORY:  Dana [name] 83 y.o. female returns for routine follow-up of leukopenia and iron deficiency anemia.  She was last seen by Pleasant Barefoot PA-C on 12/22/2021.  Since her last visit, she received 2 doses of IV Feraheme on 12/27/2022 and 01/03/2023.  At today's visit, she reports feeling.  She has not had any hospitalizations since her last visit.  No new diagnoses or changes to her baseline health status.    Since her last visit, she has not had any infections.  She denies any B symptoms such as fever, chills, night sweats, or unexplained weight loss.  She has not noticed any new lumps or bumps.  She remains on methotrexate  20 mg weekly for her rheumatoid arthritis.  She denies any signs of bleeding such as rectal bleeding or melena.  She has 80% energy and 100% appetite.   She endorses that she is maintaining a stable weight.   REVIEW OF SYSTEMS:   Patient denies any acute complaints at today's visit. Review of Systems  Constitutional:  Negative for appetite change, chills, diaphoresis, fatigue, fever and unexpected weight change.  HENT:   Negative for lump/mass and nosebleeds.   Eyes:  Negative for eye problems.  Respiratory:  Negative for cough, hemoptysis and shortness of breath.   Cardiovascular:  Negative for chest pain, leg swelling and palpitations.  Gastrointestinal:  Negative for abdominal pain, blood in stool, constipation, diarrhea, nausea and vomiting.  Genitourinary:  Negative for hematuria.   Skin: Negative.   Neurological:  Negative for dizziness, headaches and light-headedness.  Hematological:  Does not bruise/bleed easily.      PAST MEDICAL/SURGICAL HISTORY:  Past Medical History:  Diagnosis Date   CAD (coronary artery disease)     a. s/p DES to OM1 in 02/2017 b. patent stent by cath in 11/2022   Chest pain    Essential hypertension    Gout    HOCM (hypertrophic obstructive cardiomyopathy) (HCC)    Hyperlipidemia    Rheumatoid arthritis (HCC)    Past Surgical History:  Procedure Laterality Date   BIOPSY  10/20/2022   Procedure: BIOPSY;  Surgeon: Eda Iha, MD;  Location: [facility] ENDOSCOPY;  Service: Gastroenterology;;   CHOLECYSTECTOMY N/A 08/17/2018   Procedure: LAPAROSCOPIC CHOLECYSTECTOMY;  Surgeon: Mavis Anes, MD;  Location: [facility] ORS;  Service: General;  Laterality: N/A;   COLONOSCOPY WITH PROPOFOL  N/A 10/20/2022   Procedure: COLONOSCOPY WITH PROPOFOL ;  Surgeon: Eda Iha, MD;  Location: [facility] ENDOSCOPY;  Service: Gastroenterology;  Laterality: N/A;   CORONARY BALLOON ANGIOPLASTY N/A 03/03/2017   Procedure: CORONARY BALLOON ANGIOPLASTY;  Surgeon: Anner Alm ORN, MD;  Location: [facility] INVASIVE CV LAB;  Service: Cardiovascular;  Laterality: N/A;   CORONARY STENT INTERVENTION N/A 03/03/2017   Procedure: CORONARY STENT INTERVENTION;  Surgeon: Anner Alm ORN, MD;  Location: [facility] INVASIVE CV LAB;  Service: Cardiovascular;  Laterality: N/A;   ESOPHAGOGASTRODUODENOSCOPY (EGD) WITH PROPOFOL  N/A 10/20/2022   Procedure: ESOPHAGOGASTRODUODENOSCOPY (EGD) WITH PROPOFOL ;  Surgeon: Eda Iha, MD;  Location: [facility] ENDOSCOPY;  Service: Gastroenterology;  Laterality: N/A;   LEFT HEART CATH AND CORONARY ANGIOGRAPHY N/A 03/02/2017   Procedure: LEFT HEART CATH AND CORONARY ANGIOGRAPHY;  Surgeon: Wonda Sharper, MD;  Location: [facility] INVASIVE CV LAB;  Service: Cardiovascular;  Laterality: N/A;   LEFT HEART CATH AND CORONARY ANGIOGRAPHY  N/A 12/08/2022   Procedure: LEFT HEART CATH AND CORONARY ANGIOGRAPHY;  Surgeon: Wendel Lurena POUR, MD;  Location: [facility] INVASIVE CV LAB;  Service: Cardiovascular;  Laterality: N/A;   POLYPECTOMY  10/20/2022   Procedure: POLYPECTOMY;  Surgeon: Eda Iha, MD;  Location: [facility] ENDOSCOPY;  Service:  Gastroenterology;;     SOCIAL HISTORY:  Social History   Socioeconomic History   Marital status: Widowed    Spouse name: Not on file   Number of children: Not on file   Years of education: Not on file   Highest education level: Not on file  Occupational History   Not on file  Tobacco Use   Smoking status: Never   Smokeless tobacco: Never  Vaping Use   Vaping status: Never Used  Substance and Sexual Activity   Alcohol use: No   Drug use: No   Sexual activity: Not on file  Other Topics Concern   Not on file  Social History Narrative   Not on file   Social Drivers of Health   Financial Resource Strain: Not on file  Food Insecurity: No Food Insecurity (10/20/2022)   Hunger Vital Sign    Worried About Running Out of Food in the Last Year: Never true    Ran Out of Food in the Last Year: Never true  Transportation Needs: No Transportation Needs (10/20/2022)   PRAPARE - Administrator, Civil Service (Medical): No    Lack of Transportation (Non-Medical): No  Physical Activity: Not on file  Stress: Not on file  Social Connections: Not on file  Intimate Partner Violence: Not At Risk (10/20/2022)   Humiliation, Afraid, Rape, and Kick questionnaire    Fear of Current or Ex-Partner: No    Emotionally Abused: No    Physically Abused: No    Sexually Abused: No    FAMILY HISTORY:  Family History  Problem Relation Age of Onset   Hypertension Mother    Liver disease Mother    Alcohol abuse Father     CURRENT MEDICATIONS:  Outpatient Encounter Medications as of 07/06/2023  Medication Sig   acetaminophen  (TYLENOL ) 325 MG tablet Take 2 tablets (650 mg total) by mouth every 6 (six) hours as needed for mild pain, fever or headache (or Fever >/= 101).   alendronate (FOSAMAX) 70 MG tablet Take 70 mg by mouth every Wednesday.   ALLERGY RELIEF 10 MG tablet Take 10 mg by mouth daily.   allopurinol  (ZYLOPRIM ) 300 MG tablet Take 300 mg by mouth daily.   atorvastatin  (LIPITOR)  10 MG tablet Take 10 mg by mouth daily.   ezetimibe  (ZETIA ) 10 MG tablet Take 10 mg by mouth daily.   ferrous sulfate  325 (65 FE) MG tablet Take 325 mg by mouth daily with breakfast.   folic acid  (FOLVITE ) 1 MG tablet Take 1 mg by mouth daily.   furosemide  (LASIX ) 20 MG tablet Take 20 mg by mouth every other day.   methotrexate  2.5 MG tablet Take 20 mg by mouth every Wednesday.    nitroGLYCERIN  (NITROSTAT ) 0.4 MG SL tablet PLACE 1 TABLET UNDER THE TONGUE EVERY 5 MINUTES AS NEEDED FOR CHEST PAIN (Patient taking differently: Place 0.4 mg under the tongue every 5 (five) minutes as needed for chest pain.)   pantoprazole  (PROTONIX ) 40 MG tablet Take 1 tablet (40 mg total) by mouth daily.   polyethylene glycol (MIRALAX ) 17 g packet Take 17 g by mouth daily.   RESTASIS  0.05 % ophthalmic emulsion 1 drop 2 (two) times daily.  sucralfate  (CARAFATE ) 1 g tablet Take 1 g by mouth 2 (two) times daily.   verapamil  (VERELAN ) 360 MG 24 hr capsule TAKE (1) CAPSULE BY MOUTH AT BEDTIME.   Vitamin D, Cholecalciferol , 25 MCG (1000 UT) TABS Take 1 tablet by mouth daily.   Facility-Administered Encounter Medications as of 07/06/2023  Medication   acetaminophen  (TYLENOL ) tablet 650 mg   diphenhydrAMINE  (BENADRYL ) capsule 25 mg   ferumoxytol  (FERAHEME) 510 mg in sodium chloride  0.9 % 100 mL IVPB   sodium chloride  flush (NS) 0.9 % injection 3 mL    ALLERGIES:  No Known Allergies   PHYSICAL EXAM  Physical Exam Constitutional:      Appearance: Normal appearance.  HENT:     Head: Normocephalic and atraumatic.  Eyes:     Pupils: Pupils are equal, round, and reactive to light.  Cardiovascular:     Rate and Rhythm: Normal rate and regular rhythm.     Heart sounds: Normal heart sounds. No murmur heard. Pulmonary:     Effort: Pulmonary effort is normal.     Breath sounds: Normal breath sounds. No wheezing.  Abdominal:     General: Bowel sounds are normal. There is no distension.     Palpations: Abdomen is  soft.     Tenderness: There is no abdominal tenderness.  Musculoskeletal:        General: Normal range of motion.     Cervical back: Normal range of motion.  Skin:    General: Skin is warm and dry.     Findings: No rash.  Neurological:     Mental Status: She is alert and oriented to person, place, and time.  Psychiatric:        Judgment: Judgment normal.          06/30/2022    8:05 AM 05/10/2022    9:03 AM 05/10/2022    9:01 AM  Vitals with BMI  Weight 175 lbs 1 oz    Systolic 134 101 891  Diastolic 72 56 54  Pulse 69 65 65       LABORATORY DATA:  I have reviewed the labs as listed.  CBC    Component Value Date/Time   WBC 3.8 (L) 07/03/2023 1142   RBC 3.54 (L) 07/03/2023 1142   HGB 11.4 (L) 07/03/2023 1142   HGB 10.1 (L) 12/06/2022 1050   HCT 34.1 (L) 07/03/2023 1142   HCT 31.1 (L) 12/06/2022 1050   PLT 155 07/03/2023 1142   PLT 197 12/06/2022 1050   MCV 96.3 07/03/2023 1142   MCV 102 (H) 12/06/2022 1050   MCH 32.2 07/03/2023 1142   MCHC 33.4 07/03/2023 1142   RDW 14.4 07/03/2023 1142   RDW 16.3 (H) 12/06/2022 1050   LYMPHSABS 0.9 06/30/2023 1035   MONOABS 0.2 06/30/2023 1035   EOSABS 0.1 06/30/2023 1035   BASOSABS 0.0 06/30/2023 1035      Latest Ref Rng & Units 07/03/2023   11:42 AM 06/30/2023   10:35 AM 02/09/2023   12:55 PM  CMP  Glucose 70 - 99 mg/dL 88  898  854   BUN 8 - 23 mg/dL 13  10  18    Creatinine 0.44 - 1.00 mg/dL 9.27  9.16  9.23   Sodium 135 - 145 mmol/L 140  138  135   Potassium 3.5 - 5.1 mmol/L 3.8  3.5  3.5   Chloride 98 - 111 mmol/L 111  105  103   CO2 22 - 32 mmol/L 24  25  22   Calcium  8.9 - 10.3 mg/dL 8.8  8.9  8.6   Total Protein 6.5 - 8.1 g/dL 6.8  7.9    Total Bilirubin 0.0 - 1.2 mg/dL 0.6  1.0    Alkaline Phos 38 - 126 U/L 62  77    AST 15 - 41 U/L 18  20    ALT 0 - 44 U/L 10  12      DIAGNOSTIC IMAGING:  I have independently reviewed the relevant imaging and discussed with the patient.  ASSESSMENT & PLAN: 1.   Leukopenia - Here at the request of her primary care provider (NP Vernell Beams) for the evaluation of leukopenia - Lab records sent by PCP included CBC (06/11/2021) with mildly low WBC 3.3 with normal differential, normal Hgb 11.7/MCV 97, normal platelets 238.  Prior labs (03/19/2021) show WBC 3.3 with normal differential.  Before that, patient had normal WBC 4.1 (07/08/2020). - She denies any frequent infections or B symptoms    - No lymphadenopathy or hepatosplenomegaly on exam.     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  Iron deficiency anemia - Being managed by primary care provider - Currently taking ferrous sulfate  325 mg daily - No rectal bleeding or melena -- Additional anemia workup (06/23/2022): Normal B12, folate, MMA, copper .  CMP shows normal kidney function.  Ferritin 70 with iron saturation 25%.  Normal SPEP and immunofixation.  Mild elevations in kappa and lambda light chains, but with normal ratio 1.44. - DIFFERENTIAL DIAGNOSIS: Suspect anemia of chronic disease versus anemia secondary to methotrexate .  May also represent early MDS.   3.  Other history - PMH:  Hypertrophic obstructive cardiomyopathy, hypertension, CAD s/p stents, diastolic congestive heart failure, gouty arthritis, hyperlipidemia, GERD, iron deficiency anemia, and rheumatoid arthritis - SOCIAL: She lives at home.  She worked as a futures trader.  No tobacco, alcohol, illicit drug use. - FAMILY: No known blood disorders or  malignancy in the patient's family.   PLAN: 1. Leukopenia, unspecified type (Primary) - Labs from 07/03/2023 show white blood cell count of 3.8 (4.4.). 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 - If she develops any significant neutropenia or other cytopenias in the future, we will speak with Dr. Curt (rheumatology in [place]) and see if we can decrease the dose of her methotrexate . -Recommend routine follow-up.  2. Iron deficiency anemia secondary to blood loss (chronic) - Most recent labs (07/03/2023): Show ferritin 277, iron saturation 17%, and TIBC 275 - Most recent CBC (07/02/2023): Hgb 11.4/MCV 96.3 -Continue iron tablet daily as prescribed by PCP - Continue monitoring of anemia with repeat CBC in 12 months  PLAN SUMMARY: >> Labs in 1 year (CBC/D, ferritin, iron/TIBC, CMP, B12, MMA, folate) >> OFFICE visit 1 week after labs      I discussed the assessment and treatment plan with the patient. The patient was provided an opportunity to ask questions and all were answered. The patient agreed with the plan and demonstrated an understanding of the instructions.   The patient was advised to call or seek an in-person evaluation if the symptoms  worsen or if the condition fails to improve as anticipated.  Medical decision making: Low  I spent 20 minutes dedicated to the care of this patient (face-to-face and non-face-to-face) on the date of the encounter to include what is described in the assessment and plan.  Delon Hope, NP 07/06/2023 8:35 AM

## 2023-07-06 ENCOUNTER — Inpatient Hospital Stay: Payer: Medicare Other | Admitting: Oncology

## 2023-07-13 ENCOUNTER — Inpatient Hospital Stay (HOSPITAL_BASED_OUTPATIENT_CLINIC_OR_DEPARTMENT_OTHER): Payer: Medicare Other | Admitting: Oncology

## 2023-07-13 VITALS — BP 141/85 | HR 75 | Temp 96.7°F | Resp 20 | Wt 169.5 lb

## 2023-07-13 DIAGNOSIS — D500 Iron deficiency anemia secondary to blood loss (chronic): Secondary | ICD-10-CM | POA: Diagnosis not present

## 2023-07-13 DIAGNOSIS — D72819 Decreased white blood cell count, unspecified: Secondary | ICD-10-CM | POA: Diagnosis not present

## 2023-07-13 NOTE — Progress Notes (Signed)
PCP:  Kara Pacer, NP [address] [place] [address] [phone]  REASON FOR VISIT:  Follow-up for evaluation of mild leukopenia  PRIOR THERAPY: None  CURRENT THERAPY: Surveillance  INTERVAL HISTORY:  Ms. Dana [name] 83 y.o. female returns for routine follow-up of leukopenia and iron deficiency anemia.  She was last seen by Rojelio Brenner PA-C on 12/22/2021.  Since her last visit, she received 2 doses of IV Feraheme on 12/27/2022 and 01/03/2023.  At today's visit, she reports feeling well.  She is here today with her daughter-in-law.  Reports an appetite and energy level of 100%.  Has occasional chronic shoulder pain but relates it to old age.  She has not had any hospitalizations since her last visit.  No new diagnoses or changes to her baseline health status.    Since her last visit, she has not had any infections.  She denies any B symptoms such as fever, chills, night sweats, or unexplained weight loss.  She has not noticed any new lumps or bumps.  She remains on methotrexate 20 mg weekly for her rheumatoid arthritis.  She denies any signs of bleeding such as rectal bleeding or melena.   REVIEW OF SYSTEMS:   Patient denies any acute complaints at today's visit. Review of Systems  Constitutional:  Negative for fatigue.  Respiratory:  Negative for cough.   Gastrointestinal:  Negative for abdominal pain, blood in stool, constipation and nausea.  Endocrine: Negative for hot flashes.  Musculoskeletal:  Positive for arthralgias (Shoulder pain).  Neurological:  Negative for dizziness and headaches.      PAST MEDICAL/SURGICAL HISTORY:  Past Medical History:  Diagnosis Date   CAD (coronary artery disease)    a. s/p DES to OM1 in 02/2017 b. patent stent by cath in 11/2022   Chest pain    Essential hypertension    Gout    HOCM (hypertrophic obstructive cardiomyopathy) (HCC)    Hyperlipidemia    Rheumatoid arthritis (HCC)    Past Surgical History:   Procedure Laterality Date   BIOPSY  10/20/2022   Procedure: BIOPSY;  Surgeon: Tressia Danas, MD;  Location: [facility] ENDOSCOPY;  Service: Gastroenterology;;   CHOLECYSTECTOMY N/A 08/17/2018   Procedure: LAPAROSCOPIC CHOLECYSTECTOMY;  Surgeon: [name], MD;  Location: [facility] ORS;  Service: General;  Laterality: N/A;   COLONOSCOPY WITH PROPOFOL N/A 10/20/2022   Procedure: COLONOSCOPY WITH PROPOFOL;  Surgeon: Tressia Danas, MD;  Location: [facility] ENDOSCOPY;  Service: Gastroenterology;  Laterality: N/A;   CORONARY BALLOON ANGIOPLASTY N/A 03/03/2017   Procedure: CORONARY BALLOON ANGIOPLASTY;  Surgeon: Marykay Lex, MD;  Location: [facility] INVASIVE CV LAB;  Service: Cardiovascular;  Laterality: N/A;   CORONARY STENT INTERVENTION N/A 03/03/2017   Procedure: CORONARY STENT INTERVENTION;  Surgeon: Marykay Lex, MD;  Location: [facility] INVASIVE CV LAB;  Service: Cardiovascular;  Laterality: N/A;   ESOPHAGOGASTRODUODENOSCOPY (EGD) WITH PROPOFOL N/A 10/20/2022   Procedure: ESOPHAGOGASTRODUODENOSCOPY (EGD) WITH PROPOFOL;  Surgeon: Tressia Danas, MD;  Location: [facility] ENDOSCOPY;  Service: Gastroenterology;  Laterality: N/A;   LEFT HEART CATH AND CORONARY ANGIOGRAPHY N/A 03/02/2017   Procedure: LEFT HEART CATH AND CORONARY ANGIOGRAPHY;  Surgeon: Tonny Bollman, MD;  Location: [facility] INVASIVE CV LAB;  Service: Cardiovascular;  Laterality: N/A;   LEFT HEART CATH AND CORONARY ANGIOGRAPHY N/A 12/08/2022   Procedure: LEFT HEART CATH AND CORONARY ANGIOGRAPHY;  Surgeon: Orbie Pyo, MD;  Location: [facility] INVASIVE CV LAB;  Service: Cardiovascular;  Laterality: N/A;   POLYPECTOMY  10/20/2022   Procedure: POLYPECTOMY;  Surgeon: Tressia Danas, MD;  Location: [facility] ENDOSCOPY;  Service: Gastroenterology;;     SOCIAL HISTORY:  Social History   Socioeconomic History   Marital status: Widowed    Spouse name: Not on file   Number of children: Not on file   Years of education: Not on file   Highest education level: Not on file   Occupational History   Not on file  Tobacco Use   Smoking status: Never   Smokeless tobacco: Never  Vaping Use   Vaping status: Never Used  Substance and Sexual Activity   Alcohol use: No   Drug use: No   Sexual activity: Not on file  Other Topics Concern   Not on file  Social History Narrative   Not on file   Social Drivers of Health   Financial Resource Strain: Not on file  Food Insecurity: No Food Insecurity (10/20/2022)   Hunger Vital Sign    Worried About Running Out of Food in the Last Year: Never true    Ran Out of Food in the Last Year: Never true  Transportation Needs: No Transportation Needs (10/20/2022)   PRAPARE - Administrator, Civil Service (Medical): No    Lack of Transportation (Non-Medical): No  Physical Activity: Not on file  Stress: Not on file  Social Connections: Not on file  Intimate Partner Violence: Not At Risk (10/20/2022)   Humiliation, Afraid, Rape, and Kick questionnaire    Fear of Current or Ex-Partner: No    Emotionally Abused: No    Physically Abused: No    Sexually Abused: No    FAMILY HISTORY:  Family History  Problem Relation Age of Onset   Hypertension Mother    Liver disease Mother    Alcohol abuse Father     CURRENT MEDICATIONS:  Outpatient Encounter Medications as of 07/13/2023  Medication Sig   acetaminophen (TYLENOL) 325 MG tablet Take 2 tablets (650 mg total) by mouth every 6 (six) hours as needed for mild pain, fever or headache (or Fever >/= 101).   alendronate (FOSAMAX) 70 MG tablet Take 70 mg by mouth every Wednesday.   ALLERGY RELIEF 10 MG tablet Take 10 mg by mouth daily.   allopurinol (ZYLOPRIM) 300 MG tablet Take 300 mg by mouth daily.   atorvastatin (LIPITOR) 10 MG tablet Take 10 mg by mouth daily.   ezetimibe (ZETIA) 10 MG tablet Take 10 mg by mouth daily.   ferrous sulfate 325 (65 FE) MG tablet Take 325 mg by mouth daily with breakfast.   fluticasone (FLONASE) 50 MCG/ACT nasal spray Place into both  nostrils.   folic acid (FOLVITE) 1 MG tablet Take 1 mg by mouth daily.   furosemide (LASIX) 20 MG tablet Take 20 mg by mouth every other day.   methotrexate 2.5 MG tablet Take 20 mg by mouth every Wednesday.    nitroGLYCERIN (NITROSTAT) 0.4 MG SL tablet PLACE 1 TABLET UNDER THE TONGUE EVERY 5 MINUTES AS NEEDED FOR CHEST PAIN (Patient taking differently: Place 0.4 mg under the tongue every 5 (five) minutes as needed for chest pain.)   pantoprazole (PROTONIX) 40 MG tablet Take 1 tablet (40 mg total) by mouth daily.   polyethylene glycol (MIRALAX) 17 g packet Take 17 g by mouth daily.   RESTASIS 0.05 % ophthalmic emulsion 1 drop 2 (two) times daily.   sucralfate (CARAFATE) 1 g tablet Take 1 g by mouth 2 (two) times daily.   verapamil (VERELAN) 360 MG 24 hr capsule TAKE (1)  CAPSULE BY MOUTH AT BEDTIME.   Vitamin D, Cholecalciferol, 25 MCG (1000 UT) TABS Take 1 tablet by mouth daily.   Facility-Administered Encounter Medications as of 07/13/2023  Medication   acetaminophen (TYLENOL) tablet 650 mg   diphenhydrAMINE (BENADRYL) capsule 25 mg   ferumoxytol (FERAHEME) 510 mg in sodium chloride 0.9 % 100 mL IVPB   sodium chloride flush (NS) 0.9 % injection 3 mL    ALLERGIES:  No Known Allergies   PHYSICAL EXAM  Physical Exam Constitutional:      Appearance: Normal appearance.  Cardiovascular:     Rate and Rhythm: Normal rate and regular rhythm.  Pulmonary:     Effort: Pulmonary effort is normal.     Breath sounds: Normal breath sounds.  Abdominal:     General: Bowel sounds are normal.     Palpations: Abdomen is soft.  Musculoskeletal:        General: No swelling. Normal range of motion.  Neurological:     Mental Status: She is alert and oriented to person, place, and time. Mental status is at baseline.          06/30/2022    8:05 AM 05/10/2022    9:03 AM 05/10/2022    9:01 AM  Vitals with BMI  Weight 175 lbs 1 oz    Systolic 134 101 213  Diastolic 72 56 54  Pulse 69 65 65        LABORATORY DATA:  I have reviewed the labs as listed.  CBC    Component Value Date/Time   WBC 3.8 (L) 07/03/2023 1142   RBC 3.54 (L) 07/03/2023 1142   HGB 11.4 (L) 07/03/2023 1142   HGB 10.1 (L) 12/06/2022 1050   HCT 34.1 (L) 07/03/2023 1142   HCT 31.1 (L) 12/06/2022 1050   PLT 155 07/03/2023 1142   PLT 197 12/06/2022 1050   MCV 96.3 07/03/2023 1142   MCV 102 (H) 12/06/2022 1050   MCH 32.2 07/03/2023 1142   MCHC 33.4 07/03/2023 1142   RDW 14.4 07/03/2023 1142   RDW 16.3 (H) 12/06/2022 1050   LYMPHSABS 0.9 06/30/2023 1035   MONOABS 0.2 06/30/2023 1035   EOSABS 0.1 06/30/2023 1035   BASOSABS 0.0 06/30/2023 1035      Latest Ref Rng & Units 07/03/2023   11:42 AM 06/30/2023   10:35 AM 02/09/2023   12:55 PM  CMP  Glucose 70 - 99 mg/dL 88  086  578   BUN 8 - 23 mg/dL 13  10  18    Creatinine 0.44 - 1.00 mg/dL 4.69  6.29  5.28   Sodium 135 - 145 mmol/L 140  138  135   Potassium 3.5 - 5.1 mmol/L 3.8  3.5  3.5   Chloride 98 - 111 mmol/L 111  105  103   CO2 22 - 32 mmol/L 24  25  22    Calcium 8.9 - 10.3 mg/dL 8.8  8.9  8.6   Total Protein 6.5 - 8.1 g/dL 6.8  7.9    Total Bilirubin 0.0 - 1.2 mg/dL 0.6  1.0    Alkaline Phos 38 - 126 U/L 62  77    AST 15 - 41 U/L 18  20    ALT 0 - 44 U/L 10  12      DIAGNOSTIC IMAGING:  I have independently reviewed the relevant imaging and discussed with the patient.  ASSESSMENT & PLAN: 1.  Leukopenia - Here at the request of her primary care provider (NP Royann Shivers)  for the evaluation of leukopenia - Lab records sent by PCP included CBC (06/11/2021) with mildly low WBC 3.3 with normal differential, normal Hgb 11.7/MCV 97, normal platelets 238.  Prior labs (03/19/2021) show WBC 3.3 with normal differential.  Before that, patient had normal WBC 4.1 (07/08/2020). - She denies any frequent infections or B symptoms    - No lymphadenopathy or hepatosplenomegaly on exam.     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  Iron deficiency anemia - Being managed by primary care provider - Currently taking ferrous sulfate 325 mg daily - No rectal bleeding or melena -Additional anemia workup (06/23/2022): Normal B12, folate, MMA, copper.  CMP shows normal kidney function.  Ferritin 70 with iron saturation 25%.  Normal SPEP and immunofixation.  Mild elevations in kappa and lambda light chains, but with normal ratio 1.44. -Suspect anemia of chronic disease versus anemia secondary to methotrexate.  May also represent early MDS.   3.  Other history - PMH:  Hypertrophic obstructive cardiomyopathy, hypertension, CAD s/p stents, diastolic congestive heart failure, gouty arthritis, hyperlipidemia, GERD, iron deficiency anemia, and rheumatoid arthritis - SOCIAL: She lives at home.  She worked as a Futures trader.  No tobacco, alcohol, illicit drug use. - FAMILY: No known blood disorders or malignancy in the patient's family.   PLAN: 1. Leukopenia, unspecified type (Primary) - Labs from 07/03/2023 show white blood cell count of 3.8 (4.4.). 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 - If she develops any significant neutropenia or other cytopenias in the future, we will speak with Dr. Deanne Coffer (rheumatology in [place]) and see if we can decrease the dose of her methotrexate. -Recommend routine follow-up.  2. Iron deficiency anemia secondary to blood loss (chronic) - Most recent labs (07/03/2023): Show ferritin 277, iron saturation 17%, and TIBC 275.  Hemoglobin has declined  to 11.4 (13.1)/hematocrit 34.1 (38.7).  Platelet count is normal at 155. -Continue iron tablet daily as prescribed by PCP -Recommend 2 doses of IV Feraheme given about a week apart. -Return to clinic in 6 months with labs a few days before.   PLAN SUMMARY: >> Recommend 2 doses of IV Feraheme given about a week apart. >> Return to clinic in approximately 6 months with labs a few days before and office visit.      I discussed the assessment and treatment plan with the patient. The patient was provided an opportunity to ask questions and all were answered. The patient agreed with the plan and demonstrated an understanding of the instructions.   The patient was advised to call or seek an in-person evaluation if the symptoms worsen or if the condition fails to improve as anticipated.  Medical decision making: Low  I spent 20 minutes dedicated to the care of this patient (face-to-face and non-face-to-face) on the date of the encounter to include what is described in the assessment and plan.  Durenda Hurt, NP 07/13/2023 9:26 AM

## 2023-07-17 ENCOUNTER — Inpatient Hospital Stay: Payer: Medicare Other

## 2023-07-17 VITALS — BP 100/51 | HR 52 | Temp 96.6°F | Resp 18

## 2023-07-17 DIAGNOSIS — D500 Iron deficiency anemia secondary to blood loss (chronic): Secondary | ICD-10-CM | POA: Diagnosis not present

## 2023-07-17 MED ORDER — CETIRIZINE HCL 10 MG PO TABS
10.0000 mg | ORAL_TABLET | Freq: Once | ORAL | Status: AC
  Administered 2023-07-17: 10 mg via ORAL
  Filled 2023-07-17: qty 1

## 2023-07-17 MED ORDER — ACETAMINOPHEN 325 MG PO TABS
650.0000 mg | ORAL_TABLET | Freq: Once | ORAL | Status: AC
  Administered 2023-07-17: 650 mg via ORAL
  Filled 2023-07-17: qty 2

## 2023-07-17 MED ORDER — LORATADINE 10 MG PO TABS
10.0000 mg | ORAL_TABLET | Freq: Once | ORAL | Status: DC
Start: 2023-07-17 — End: 2023-07-17

## 2023-07-17 MED ORDER — SODIUM CHLORIDE 0.9 % IV SOLN
510.0000 mg | Freq: Once | INTRAVENOUS | Status: AC
  Administered 2023-07-17: 510 mg via INTRAVENOUS
  Filled 2023-07-17: qty 17

## 2023-07-17 MED ORDER — SODIUM CHLORIDE 0.9 % IV SOLN: INTRAVENOUS | Status: DC

## 2023-07-17 NOTE — Patient Instructions (Signed)
[facility] [place] - A DEPT OF [facility]  Discharge Instructions: Thank you for choosing [facility] to provide your oncology and hematology care.  If you have a lab appointment with the Cancer Center - please note that after April 8th, 2024, all labs will be drawn in the cancer center.  You do not have to check in or register with the main entrance as you have in the past but will complete your check-in in the cancer center.  Wear comfortable clothing and clothing appropriate for easy access to any Portacath or PICC line.   We strive to give you quality time with your provider. You may need to reschedule your appointment if you arrive late (15 or more minutes).  Arriving late affects you and other patients whose appointments are after yours.  Also, if you miss three or more appointments without notifying the office, you may be dismissed from the clinic at the provider's discretion.      For prescription refill requests, have your pharmacy contact our office and allow 72 hours for refills to be completed.    Today you received the following chemotherapy and/or immunotherapy agents Feraheme. Ferumoxytol Injection What is this medication? FERUMOXYTOL (FER ue MOX i tol) treats low levels of iron in your body (iron deficiency anemia). Iron is a mineral that plays an important role in making red blood cells, which carry oxygen from your lungs to the rest of your body. This medicine may be used for other purposes; ask your health care provider or pharmacist if you have questions. COMMON BRAND NAME(S): Feraheme What should I tell my care team before I take this medication? They need to know if you have any of these conditions: Anemia not caused by low iron levels High levels of iron in the blood Magnetic resonance imaging (MRI) test scheduled An unusual or allergic reaction to iron, other medications, foods, dyes, or preservatives Pregnant or trying to get  pregnant Breastfeeding How should I use this medication? This medication is injected into a vein. It is given by your care team in a hospital or clinic setting. Talk to your care team the use of this medication in children. Special care may be needed. Overdosage: If you think you have taken too much of this medicine contact a poison control center or emergency room at once. NOTE: This medicine is only for you. Do not share this medicine with others. What if I miss a dose? It is important not to miss your dose. Call your care team if you are unable to keep an appointment. What may interact with this medication? Other iron products This list may not describe all possible interactions. Give your health care provider a list of all the medicines, herbs, non-prescription drugs, or dietary supplements you use. Also tell them if you smoke, drink alcohol, or use illegal drugs. Some items may interact with your medicine. What should I watch for while using this medication? Visit your care team for regular checks on your progress. Tell your care team if your symptoms do not start to get better or if they get worse. You may need blood work done while you are taking this medication. You may need to eat more foods that contain iron. Talk to your care team. Foods that contain iron include whole grains or cereals, dried fruits, beans, peas, leafy green vegetables, and organ meats (liver, kidney). What side effects may I notice from receiving this medication? Side effects that  you should report to your care team as soon as possible: Allergic reactions--skin rash, itching, hives, swelling of the face, lips, tongue, or throat Low blood pressure--dizziness, feeling faint or lightheaded, blurry vision Shortness of breath Side effects that usually do not require medical attention (report to your care team if they continue or are bothersome): Flushing Headache Joint pain Muscle pain Nausea Pain, redness, or  irritation at injection site This list may not describe all possible side effects. Call your doctor for medical advice about side effects. You may report side effects to FDA at 1-800-FDA-1088. Where should I keep my medication? This medication is given in a hospital or clinic. It will not be stored at home. NOTE: This sheet is a summary. It may not cover all possible information. If you have questions about this medicine, talk to your doctor, pharmacist, or health care provider.  2024 Elsevier/Gold Standard (2023-02-01 00:00:00)      To help prevent nausea and vomiting after your treatment, we encourage you to take your nausea medication as directed.  BELOW ARE SYMPTOMS THAT SHOULD BE REPORTED IMMEDIATELY: *FEVER GREATER THAN 100.4 F (38 C) OR HIGHER *CHILLS OR SWEATING *NAUSEA AND VOMITING THAT IS NOT CONTROLLED WITH YOUR NAUSEA MEDICATION *UNUSUAL SHORTNESS OF BREATH *UNUSUAL BRUISING OR BLEEDING *URINARY PROBLEMS (pain or burning when urinating, or frequent urination) *BOWEL PROBLEMS (unusual diarrhea, constipation, pain near the anus) TENDERNESS IN MOUTH AND THROAT WITH OR WITHOUT PRESENCE OF ULCERS (sore throat, sores in mouth, or a toothache) UNUSUAL RASH, SWELLING OR PAIN  UNUSUAL VAGINAL DISCHARGE OR ITCHING   Items with * indicate a potential emergency and should be followed up as soon as possible or go to the Emergency Department if any problems should occur.  Please show the CHEMOTHERAPY ALERT CARD or IMMUNOTHERAPY ALERT CARD at check-in to the Emergency Department and triage nurse.  Should you have questions after your visit or need to cancel or reschedule your appointment, please contact [facility] [place] - A DEPT OF [facility] [phone]  and follow the prompts.  Office hours are 8:00 a.m. to 4:30 p.m. Monday - Friday. Please note that voicemails left after 4:00 p.m. may not be returned until the following business day.  We are closed weekends  and major holidays. You have access to a nurse at all times for urgent questions. Please call the main number to the clinic [phone] and follow the prompts.  For any non-urgent questions, you may also contact your provider using MyChart. We now offer e-Visits for anyone 28 and older to request care online for non-urgent symptoms. For details visit [email or web address].   Also download the MyChart app! Go to the app store, search "MyChart", open the app, select [facility], and log in with your MyChart username and password.

## 2023-07-17 NOTE — Progress Notes (Unsigned)
Patient presents today for Feraheme infusion. Vital signs stable. Pre-medications given. Patient denies any significant side effects related to last iron infusion.   Feraheme given today per MD orders. Tolerated infusion without adverse affects. Vital signs stable. No complaints at this time. Discharged from clinic ambulatory in stable condition. Alert and oriented x 3. F/U with [facility] as scheduled.

## 2023-07-18 ENCOUNTER — Encounter: Payer: Self-pay | Admitting: Hematology

## 2023-07-24 ENCOUNTER — Inpatient Hospital Stay: Payer: Medicare Other

## 2023-07-24 VITALS — BP 129/60 | HR 73 | Temp 98.1°F | Resp 18

## 2023-07-24 DIAGNOSIS — D500 Iron deficiency anemia secondary to blood loss (chronic): Secondary | ICD-10-CM

## 2023-07-24 MED ORDER — CETIRIZINE HCL 10 MG PO TABS
10.0000 mg | ORAL_TABLET | Freq: Once | ORAL | Status: AC
  Administered 2023-07-24: 10 mg via ORAL
  Filled 2023-07-24: qty 1

## 2023-07-24 MED ORDER — ACETAMINOPHEN 325 MG PO TABS
650.0000 mg | ORAL_TABLET | Freq: Once | ORAL | Status: AC
Start: 2023-07-24 — End: 2023-07-24
  Administered 2023-07-24: 650 mg via ORAL
  Filled 2023-07-24: qty 2

## 2023-07-24 MED ORDER — SODIUM CHLORIDE 0.9 % IV SOLN: INTRAVENOUS | Status: DC

## 2023-07-24 MED ORDER — SODIUM CHLORIDE 0.9 % IV SOLN
510.0000 mg | Freq: Once | INTRAVENOUS | Status: AC
  Administered 2023-07-24: 510 mg via INTRAVENOUS
  Filled 2023-07-24: qty 510

## 2023-07-24 NOTE — Progress Notes (Signed)
Patient tolerated iron infusion with no complaints voiced.  Peripheral IV site clean and dry with good blood return noted before and after infusion.  Pt observed for 30 minutes post iron infusion without any complications. Band aid applied.  VSS with discharge and left in satisfactory condition with no s/s of distress noted.   Brodyn Depuy [employer]

## 2023-07-24 NOTE — Patient Instructions (Signed)

## 2023-07-26 ENCOUNTER — Encounter (HOSPITAL_COMMUNITY): Payer: Self-pay

## 2023-07-26 ENCOUNTER — Other Ambulatory Visit: Payer: Self-pay

## 2023-07-26 ENCOUNTER — Observation Stay (HOSPITAL_COMMUNITY)
Admission: EM | Admit: 2023-07-26 | Discharge: 2023-07-27 | Disposition: A | Discharge status: Home / Self Care | Payer: Medicare Other | Attending: Family Medicine | Admitting: Family Medicine

## 2023-07-26 ENCOUNTER — Emergency Department (HOSPITAL_COMMUNITY): Payer: Medicare Other

## 2023-07-26 DIAGNOSIS — Z955 Presence of coronary angioplasty implant and graft: Secondary | ICD-10-CM | POA: Insufficient documentation

## 2023-07-26 DIAGNOSIS — I25118 Atherosclerotic heart disease of native coronary artery with other forms of angina pectoris: Secondary | ICD-10-CM | POA: Diagnosis not present

## 2023-07-26 DIAGNOSIS — K219 Gastro-esophageal reflux disease without esophagitis: Secondary | ICD-10-CM | POA: Diagnosis present

## 2023-07-26 DIAGNOSIS — I10 Essential (primary) hypertension: Principal | ICD-10-CM | POA: Diagnosis present

## 2023-07-26 DIAGNOSIS — E785 Hyperlipidemia, unspecified: Secondary | ICD-10-CM | POA: Diagnosis not present

## 2023-07-26 DIAGNOSIS — M069 Rheumatoid arthritis, unspecified: Secondary | ICD-10-CM | POA: Diagnosis present

## 2023-07-26 DIAGNOSIS — R0789 Other chest pain: Secondary | ICD-10-CM

## 2023-07-26 DIAGNOSIS — R072 Precordial pain: Principal | ICD-10-CM

## 2023-07-26 DIAGNOSIS — R079 Chest pain, unspecified: Secondary | ICD-10-CM | POA: Diagnosis not present

## 2023-07-26 DIAGNOSIS — M109 Gout, unspecified: Secondary | ICD-10-CM | POA: Diagnosis not present

## 2023-07-26 DIAGNOSIS — I2510 Atherosclerotic heart disease of native coronary artery without angina pectoris: Secondary | ICD-10-CM | POA: Diagnosis not present

## 2023-07-26 DIAGNOSIS — I421 Obstructive hypertrophic cardiomyopathy: Secondary | ICD-10-CM | POA: Diagnosis not present

## 2023-07-26 LAB — CBC WITH DIFFERENTIAL/PLATELET
Abs Immature Granulocytes: 0.01 10*3/uL (ref 0.00–0.07)
Basophils Absolute: 0 10*3/uL (ref 0.0–0.1)
Basophils Relative: 1 %
Eosinophils Absolute: 0.1 10*3/uL (ref 0.0–0.5)
Eosinophils Relative: 3 %
HCT: 36.5 % (ref 36.0–46.0)
Hemoglobin: 12.3 g/dL (ref 12.0–15.0)
Immature Granulocytes: 0 %
Lymphocytes Relative: 14 %
Lymphs Abs: 0.7 10*3/uL (ref 0.7–4.0)
MCH: 32.8 pg (ref 26.0–34.0)
MCHC: 33.7 g/dL (ref 30.0–36.0)
MCV: 97.3 fL (ref 80.0–100.0)
Monocytes Absolute: 0.5 10*3/uL (ref 0.1–1.0)
Monocytes Relative: 10 %
Neutro Abs: 3.2 10*3/uL (ref 1.7–7.7)
Neutrophils Relative %: 72 %
Platelets: 152 10*3/uL (ref 150–400)
RBC: 3.75 MIL/uL — ABNORMAL LOW (ref 3.87–5.11)
RDW: 16.1 % — ABNORMAL HIGH (ref 11.5–15.5)
WBC: 4.5 10*3/uL (ref 4.0–10.5)
nRBC: 0 % (ref 0.0–0.2)

## 2023-07-26 LAB — BASIC METABOLIC PANEL
Anion gap: 8 (ref 5–15)
BUN: 16 mg/dL (ref 8–23)
CO2: 24 mmol/L (ref 22–32)
Calcium: 9.2 mg/dL (ref 8.9–10.3)
Chloride: 106 mmol/L (ref 98–111)
Creatinine, Ser: 0.67 mg/dL (ref 0.44–1.00)
GFR, Estimated: >60 mL/min (ref >60)
Glucose, Bld: 107 mg/dL — ABNORMAL HIGH (ref 70–99)
Potassium: 3.5 mmol/L (ref 3.5–5.1)
Sodium: 138 mmol/L (ref 135–145)

## 2023-07-26 LAB — BRAIN NATRIURETIC PEPTIDE: B Natriuretic Peptide: 269 pg/mL — ABNORMAL HIGH (ref 0.0–100.0)

## 2023-07-26 LAB — TROPONIN I (HIGH SENSITIVITY)
Troponin I (High Sensitivity): 155 ng/L (ref <18)
Troponin I (High Sensitivity): 155 ng/L (ref <18)

## 2023-07-26 MED ORDER — HEPARIN SODIUM (PORCINE) 5000 UNIT/ML IJ SOLN
5000.0000 [IU] | Freq: Three times a day (TID) | INTRAMUSCULAR | Status: DC
  Administered 2023-07-27 (×2): 5000 [IU] via SUBCUTANEOUS
  Filled 2023-07-26 (×2): qty 1

## 2023-07-26 MED ORDER — ATORVASTATIN CALCIUM 10 MG PO TABS
10.0000 mg | ORAL_TABLET | Freq: Every day | ORAL | Status: DC
  Administered 2023-07-26 – 2023-07-27 (×2): 10 mg via ORAL
  Filled 2023-07-26 (×2): qty 1

## 2023-07-26 MED ORDER — VERAPAMIL HCL ER 180 MG PO TBCR
360.0000 mg | EXTENDED_RELEASE_TABLET | Freq: Every day | ORAL | Status: DC
  Administered 2023-07-27: 360 mg via ORAL
  Filled 2023-07-26 (×3): qty 2

## 2023-07-26 MED ORDER — ACETAMINOPHEN 325 MG PO TABS: 650.0000 mg | ORAL_TABLET | ORAL | Status: DC | PRN

## 2023-07-26 MED ORDER — ASPIRIN 81 MG PO TBEC
81.0000 mg | DELAYED_RELEASE_TABLET | Freq: Every day | ORAL | Status: DC
  Administered 2023-07-26 – 2023-07-27 (×2): 81 mg via ORAL
  Filled 2023-07-26 (×2): qty 1

## 2023-07-26 MED ORDER — ALLOPURINOL 300 MG PO TABS
300.0000 mg | ORAL_TABLET | Freq: Every day | ORAL | Status: DC
  Administered 2023-07-27: 300 mg via ORAL
  Filled 2023-07-26: qty 1

## 2023-07-26 MED ORDER — NITROGLYCERIN 0.4 MG SL SUBL: 0.4000 mg | SUBLINGUAL_TABLET | SUBLINGUAL | Status: DC | PRN

## 2023-07-26 MED ORDER — CYCLOSPORINE 0.05 % OP EMUL
1.0000 [drp] | Freq: Two times a day (BID) | OPHTHALMIC | Status: DC
  Administered 2023-07-26 – 2023-07-27 (×2): 1 [drp] via OPHTHALMIC
  Filled 2023-07-26 (×2): qty 30

## 2023-07-26 MED ORDER — ONDANSETRON HCL 4 MG/2ML IJ SOLN: 4.0000 mg | Freq: Four times a day (QID) | INTRAMUSCULAR | Status: DC | PRN

## 2023-07-26 MED ORDER — FLUTICASONE PROPIONATE 50 MCG/ACT NA SUSP
1.0000 | Freq: Every day | NASAL | Status: DC
  Administered 2023-07-27: 1 via NASAL
  Filled 2023-07-26: qty 16

## 2023-07-26 MED ORDER — PANTOPRAZOLE SODIUM 40 MG PO TBEC
40.0000 mg | DELAYED_RELEASE_TABLET | Freq: Every day | ORAL | Status: DC
  Administered 2023-07-26 – 2023-07-27 (×2): 40 mg via ORAL
  Filled 2023-07-26 (×2): qty 1

## 2023-07-26 MED ORDER — EZETIMIBE 10 MG PO TABS
10.0000 mg | ORAL_TABLET | Freq: Every day | ORAL | Status: DC
  Administered 2023-07-26 – 2023-07-27 (×2): 10 mg via ORAL
  Filled 2023-07-26 (×2): qty 1

## 2023-07-26 MED ORDER — SUCRALFATE 1 G PO TABS
1.0000 g | ORAL_TABLET | Freq: Two times a day (BID) | ORAL | Status: DC
  Administered 2023-07-26 – 2023-07-27 (×2): 1 g via ORAL
  Filled 2023-07-26 (×2): qty 1

## 2023-07-26 MED ORDER — FOLIC ACID 1 MG PO TABS
1.0000 mg | ORAL_TABLET | Freq: Every day | ORAL | Status: DC
  Administered 2023-07-27: 1 mg via ORAL
  Filled 2023-07-26: qty 1

## 2023-07-26 NOTE — ED Provider Triage Note (Signed)
Emergency Medicine Provider Triage Evaluation Note  Dana [name] , a 83 y.o. female  was evaluated in triage.  Pt complains of chest pain and nonproductive cough which has been intermittent since yesterday.  Endorses shortness of breath and a heavy sensation in her chest, not necessarily similar to symptoms she experienced prior to her stent placement in 2018.[name]  Review of Systems  Positive: Shortness of breath, chest heaviness, cough Negative: Fever, diaphoresis, nausea vomiting  Physical Exam  BP 127/65 (BP Location: Right Arm)   Pulse 74   Temp 98.1 F (36.7 C)   Resp 18   Ht 5\' 2"  (1.575 m)   Wt 76.9 kg   SpO2 96%   BMI 31.01 kg/m  Gen:   Awake, no distress   Resp:  Normal effort  MSK:   Moves extremities without difficulty  Other:  Heart rate is normal and regular, she does have a systolic murmur.  Medical Decision Making  Medically screening exam initiated at 10:38 AM.  Appropriate orders placed.  Lokelani Vanliew was informed that the remainder of the evaluation will be completed by another provider, this initial triage assessment does not replace that evaluation, and the importance of remaining in the ED until their evaluation is complete.     Burgess Amor, PA-C 07/26/23 1041

## 2023-07-26 NOTE — ED Provider Notes (Signed)
Patient seen by Dr. Wyline Mood.  He is recommending that she be admitted to have repeat echo he felt that the troponins being at 155 may be secondary to her HOCM and not acute coronary syndrome.  Patient still has some slight chest discomfort but overall significantly improved.  Patient appears nontoxic no acute distress.  Hospitalist contacted for admission.   Vanetta Mulders, MD 07/26/23 [phone]

## 2023-07-26 NOTE — ED Provider Notes (Signed)
[place] EMERGENCY DEPARTMENT AT [facility] Provider Note   CSN: [id number] Arrival date & time: 07/26/23  6295     History {Add pertinent medical, surgical, social history, OB history to HPI:1} Chief Complaint  Patient presents with   Chest Pain    Dana [name] is a 83 y.o. female.  Patient with a history of coronary artery disease.  She has at least 1 stent.  Patient states that last night she had chest pain for 4 hours and then this morning she woke up and had 1 hour chest pain.  She no longer has pain  The history is provided by the patient and medical records. No language interpreter was used.  Chest Pain Pain location:  L chest Pain quality: aching   Pain severity:  Moderate Onset quality:  Sudden Timing:  Constant Progression:  Worsening Chronicity:  New Context: not breathing   Relieved by:  Nothing Worsened by:  Nothing Associated symptoms: no abdominal pain, no back pain, no cough, no fatigue and no headache        Home Medications Prior to Admission medications   Medication Sig Start Date End Date Taking? Authorizing Provider  acetaminophen (TYLENOL) 325 MG tablet Take 2 tablets (650 mg total) by mouth every 6 (six) hours as needed for mild pain, fever or headache (or Fever >/= 101). 08/07/19   Shon Hale, MD  alendronate (FOSAMAX) 70 MG tablet Take 70 mg by mouth every Wednesday. 03/15/19   [provider]  ALLERGY RELIEF 10 MG tablet Take 10 mg by mouth daily. 07/26/19   [provider]  allopurinol (ZYLOPRIM) 300 MG tablet Take 300 mg by mouth daily.    [provider]  atorvastatin (LIPITOR) 10 MG tablet Take 10 mg by mouth daily.    [provider]  ezetimibe (ZETIA) 10 MG tablet Take 10 mg by mouth daily.    [provider]  ferrous sulfate 325 (65 FE) MG tablet Take 325 mg by mouth daily with breakfast.    [provider]  fluticasone (FLONASE) 50 MCG/ACT nasal spray Place into both  nostrils. 06/08/23   [provider]  folic acid (FOLVITE) 1 MG tablet Take 1 mg by mouth daily. 07/26/19   [provider]  furosemide (LASIX) 20 MG tablet Take 20 mg by mouth every other day.    [provider]  methotrexate 2.5 MG tablet Take 20 mg by mouth every Wednesday.     [provider]  nitroGLYCERIN (NITROSTAT) 0.4 MG SL tablet PLACE 1 TABLET UNDER THE TONGUE EVERY 5 MINUTES AS NEEDED FOR CHEST PAIN Patient taking differently: Place 0.4 mg under the tongue every 5 (five) minutes as needed for chest pain. 03/05/21   Jonelle Sidle, MD  pantoprazole (PROTONIX) 40 MG tablet Take 1 tablet (40 mg total) by mouth daily. 08/07/19   Shon Hale, MD  polyethylene glycol (MIRALAX) 17 g packet Take 17 g by mouth daily. 02/09/23   Carmel Sacramento A, PA-C  RESTASIS 0.05 % ophthalmic emulsion 1 drop 2 (two) times daily. 11/10/22   [provider]  sucralfate (CARAFATE) 1 g tablet Take 1 g by mouth 2 (two) times daily. 08/01/19   [provider]  verapamil (VERELAN) 360 MG 24 hr capsule TAKE (1) CAPSULE BY MOUTH AT BEDTIME. 06/30/23   Chandrasekhar, Mahesh A, MD  Vitamin D, Cholecalciferol, 25 MCG (1000 UT) TABS Take 1 tablet by mouth daily. 12/31/20   [provider]  Allergies    Patient has no known allergies.    Review of Systems   Review of Systems  Constitutional:  Negative for appetite change and fatigue.  HENT:  Negative for congestion, ear discharge and sinus pressure.   Eyes:  Negative for discharge.  Respiratory:  Negative for cough.   Cardiovascular:  Positive for chest pain.  Gastrointestinal:  Negative for abdominal pain and diarrhea.  Genitourinary:  Negative for frequency and hematuria.  Musculoskeletal:  Negative for back pain.  Skin:  Negative for rash.  Neurological:  Negative for seizures and headaches.  Psychiatric/Behavioral:  Negative for hallucinations.     Physical Exam Updated Vital Signs BP  118/61   Pulse 70   Temp 98.1 F (36.7 C)   Resp (!) 25   Ht 5\' 2"  (1.575 m)   Wt 76.9 kg   SpO2 93%   BMI 31.01 kg/m  Physical Exam Vitals and nursing note reviewed.  Constitutional:      Appearance: She is well-developed.  HENT:     Head: Normocephalic.     Nose: Nose normal.  Eyes:     General: No scleral icterus.    Conjunctiva/sclera: Conjunctivae normal.  Neck:     Thyroid: No thyromegaly.  Cardiovascular:     Rate and Rhythm: Normal rate and regular rhythm.     Heart sounds: No murmur heard.    No friction rub. No gallop.  Pulmonary:     Breath sounds: No stridor. No wheezing or rales.  Chest:     Chest wall: No tenderness.  Abdominal:     General: There is no distension.     Tenderness: There is no abdominal tenderness. There is no rebound.  Musculoskeletal:        General: Normal range of motion.     Cervical back: Neck supple.  Lymphadenopathy:     Cervical: No cervical adenopathy.  Skin:    Findings: No erythema or rash.  Neurological:     Mental Status: She is alert and oriented to person, place, and time.     Motor: No abnormal muscle tone.     Coordination: Coordination normal.  Psychiatric:        Behavior: Behavior normal.     ED Results / Procedures / Treatments   Labs (all labs ordered are listed, but only abnormal results are displayed) Labs Reviewed  BASIC METABOLIC PANEL - Abnormal; Notable for the following components:      Result Value   Glucose, Bld 107 (*)    All other components within normal limits  CBC WITH DIFFERENTIAL/PLATELET - Abnormal; Notable for the following components:   RBC 3.75 (*)    RDW 16.1 (*)    All other components within normal limits  BRAIN NATRIURETIC PEPTIDE - Abnormal; Notable for the following components:   B Natriuretic Peptide 269.0 (*)    All other components within normal limits  TROPONIN I (HIGH SENSITIVITY) - Abnormal; Notable for the following components:   Troponin I (High Sensitivity) 155 (*)     All other components within normal limits  TROPONIN I (HIGH SENSITIVITY) - Abnormal; Notable for the following components:   Troponin I (High Sensitivity) 155 (*)    All other components within normal limits    EKG EKG Interpretation Date/Time:  Wednesday July 26 2023 09:26:35 EST Ventricular Rate:  74 PR Interval:  218 QRS Duration:  126 QT Interval:  424 QTC Calculation: 470 R Axis:   -12  Text Interpretation: Sinus rhythm  with 1st degree A-V block Left ventricular hypertrophy with QRS widening and repolarization abnormality ( R in aVL , Sokolow-Lyon , Cornell product , Romhilt-Estes ) Cannot rule out Anteroseptal infarct , age undetermined Abnormal ECG When compared with ECG of 09-Feb-2023 13:23, PREVIOUS ECG IS PRESENT Confirmed by Bethann Berkshire [phone]) on 07/26/2023 9:36:57 AM  Radiology DG Chest 2 View Result Date: 07/26/2023 CLINICAL DATA:  Chest pain. EXAM: CHEST - 2 VIEW COMPARISON:  February 09, 2023. FINDINGS: Mild cardiomegaly is noted with possible mild central pulmonary vascular congestion. No consolidative process is noted. Bony thorax is unremarkable. IMPRESSION: Mild cardiomegaly with mild central pulmonary vascular congestion. Electronically Signed   By: Lupita Raider M.D.   On: 07/26/2023 10:21    Procedures Procedures  {Document cardiac monitor, telemetry assessment procedure when appropriate:1}  Medications Ordered in ED Medications - No data to display  ED Course/ Medical Decision Making/ A&P   {Patient with chest pain and mildly elevated troponin that is flat.  I spoke with cardiology and they will consult on the patient Click here for ABCD2, HEART and other calculatorsREFRESH Note before signing :1}                              Medical Decision Making Amount and/or Complexity of Data Reviewed Labs: ordered. Radiology: ordered.   Chest pain, NSTEMI.  Cardiology to see patient  {Document critical care time when appropriate:1} {Document review of  labs and clinical decision tools ie heart score, Chads2Vasc2 etc:1}  {Document your independent review of radiology images, and any outside records:1} {Document your discussion with family members, caretakers, and with consultants:1} {Document social determinants of health affecting pt's care:1} {Document your decision making why or why not admission, treatments were needed:1} Final Clinical Impression(s) / ED Diagnoses Final diagnoses:  None    Rx / DC Orders ED Discharge Orders     None

## 2023-07-26 NOTE — ED Triage Notes (Signed)
Pt arrived via POV c/o chest pain that began yesterday. Pt reports she took all prescribed medications today. Pt endorses SOB, and heaviness on her chest.

## 2023-07-26 NOTE — H&P (Signed)
TRH H&P   Patient Demographics:    Dana [name], is a 83 y.o. female  MRN: [id number]   DOB - July 18, 1940  Admit Date - 07/26/2023  Outpatient Primary MD for the patient is Nsumanganyi, Colleen Can, NP  Referring MD/NP/PA: Dr. Deretha Emory  Outpatient Specialists: Cardiology Dr. Raynelle Jan  Patient coming from: Home  Chief Complaint  Patient presents with   Chest Pain      HPI:    Dana [name]  is a 83 y.o. female,with medical history significant of hypertension, hyperlipidemia,HOCM, CAD s/p stent placement, GERD . -Patient with admission 4/24 secondary to GI bleed. -Patient presents to ED secondary to complaints of chest pain, some shortness of breath, reports chest pain started yesterday, at rest, 6 out of 10 intensity, pressure-like quality, nonradiating, at 1 to 2 hours, resolved without intervention, came to ED for further evaluation -In ED she is currently chest pain-free, EKG nonacute, troponinsElevated, which is at her baseline, but stable 155>155, BNP mildly elevated at 269, was seen by cardiology, presentation most likely in the setting of HOCM, they recommend admission for observation, and updating echo, so [facility] hospitalist was called to admit.   Review of systems:    A full 10 point Review of Systems was done, except as stated above, all other Review of Systems were negative.   With Past History of the following :    Past Medical History:  Diagnosis Date   CAD (coronary artery disease)    a. s/p DES to OM1 in 02/2017 b. patent stent by cath in 11/2022   Chest pain    Essential hypertension    Gout    HOCM (hypertrophic obstructive cardiomyopathy) (HCC)    Hyperlipidemia    Rheumatoid arthritis (HCC)       Past Surgical History:  Procedure Laterality Date   BIOPSY  10/20/2022   Procedure: BIOPSY;  Surgeon: Tressia Danas, MD;  Location: [facility]  ENDOSCOPY;  Service: Gastroenterology;;   CHOLECYSTECTOMY N/A 08/17/2018   Procedure: LAPAROSCOPIC CHOLECYSTECTOMY;  Surgeon: [name], MD;  Location: [facility] ORS;  Service: General;  Laterality: N/A;   COLONOSCOPY WITH PROPOFOL N/A 10/20/2022   Procedure: COLONOSCOPY WITH PROPOFOL;  Surgeon: Tressia Danas, MD;  Location: [facility] ENDOSCOPY;  Service: Gastroenterology;  Laterality: N/A;   CORONARY BALLOON ANGIOPLASTY N/A 03/03/2017   Procedure: CORONARY BALLOON ANGIOPLASTY;  Surgeon: Marykay Lex, MD;  Location[facility] INVASIVE CV LAB;  Service: Cardiovascular;  Laterality: N/A;   CORONARY STENT INTERVENTION N/A 03/03/2017   Procedure: CORONARY STENT INTERVENTION;  Surgeon: Marykay Lex, MD;  Location: [facility] INVASIVE CV LAB;  Service: Cardiovascular;  Laterality: N/A;   ESOPHAGOGASTRODUODENOSCOPY (EGD) WITH PROPOFOL N/A 10/20/2022   Procedure: ESOPHAGOGASTRODUODENOSCOPY (EGD) WITH PROPOFOL;  Surgeon: Tressia Danas, MD;  Location: [facility] ENDOSCOPY;  Service: Gastroenterology;  Laterality: N/A;   LEFT HEART CATH AND CORONARY ANGIOGRAPHY N/A 03/02/2017   Procedure: LEFT HEART CATH AND CORONARY  ANGIOGRAPHY;  Surgeon: Tonny Bollman, MD;  Location: [facility] INVASIVE CV LAB;  Service: Cardiovascular;  Laterality: N/A;   LEFT HEART CATH AND CORONARY ANGIOGRAPHY N/A 12/08/2022   Procedure: LEFT HEART CATH AND CORONARY ANGIOGRAPHY;  Surgeon: Orbie Pyo, MD;  Location: [facility] INVASIVE CV LAB;  Service: Cardiovascular;  Laterality: N/A;   POLYPECTOMY  10/20/2022   Procedure: POLYPECTOMY;  Surgeon: Tressia Danas, MD;  Location: [facility] ENDOSCOPY;  Service: Gastroenterology;;      Social History:     Social History   Tobacco Use   Smoking status: Never   Smokeless tobacco: Never  Substance Use Topics   Alcohol use: No       Family History :     Family History  Problem Relation Age of Onset   Hypertension Mother    Liver disease Mother    Alcohol abuse Father       Home Medications:   Prior to  Admission medications   Medication Sig Start Date End Date Taking? Authorizing Provider  acetaminophen (TYLENOL) 325 MG tablet Take 2 tablets (650 mg total) by mouth every 6 (six) hours as needed for mild pain, fever or headache (or Fever >/= 101). 08/07/19   Shon Hale, MD  alendronate (FOSAMAX) 70 MG tablet Take 70 mg by mouth every Wednesday. 03/15/19   [provider]  ALLERGY RELIEF 10 MG tablet Take 10 mg by mouth daily. 07/26/19   [provider]  allopurinol (ZYLOPRIM) 300 MG tablet Take 300 mg by mouth daily.    [provider]  atorvastatin (LIPITOR) 10 MG tablet Take 10 mg by mouth daily.    [provider]  ezetimibe (ZETIA) 10 MG tablet Take 10 mg by mouth daily.    [provider]  ferrous sulfate 325 (65 FE) MG tablet Take 325 mg by mouth daily with breakfast.    [provider]  fluticasone (FLONASE) 50 MCG/ACT nasal spray Place into both nostrils. 06/08/23   [provider]  folic acid (FOLVITE) 1 MG tablet Take 1 mg by mouth daily. 07/26/19   [provider]  furosemide (LASIX) 20 MG tablet Take 20 mg by mouth every other day.    [provider]  methotrexate 2.5 MG tablet Take 20 mg by mouth every Wednesday.     [provider]  nitroGLYCERIN (NITROSTAT) 0.4 MG SL tablet PLACE 1 TABLET UNDER THE TONGUE EVERY 5 MINUTES AS NEEDED FOR CHEST PAIN Patient taking differently: Place 0.4 mg under the tongue every 5 (five) minutes as needed for chest pain. 03/05/21   Jonelle Sidle, MD  pantoprazole (PROTONIX) 40 MG tablet Take 1 tablet (40 mg total) by mouth daily. 08/07/19   Shon Hale, MD  polyethylene glycol (MIRALAX) 17 g packet Take 17 g by mouth daily. 02/09/23   Carmel Sacramento A, PA-C  RESTASIS 0.05 % ophthalmic emulsion 1 drop 2 (two) times daily. 11/10/22   [provider]  sucralfate (CARAFATE) 1 g tablet Take 1 g by mouth 2 (two) times daily. 08/01/19   [provider]  verapamil (VERELAN) 360 MG 24 hr capsule TAKE (1) CAPSULE BY MOUTH AT BEDTIME. 06/30/23   Chandrasekhar, Mahesh A, MD  Vitamin D, Cholecalciferol, 25 MCG (1000 UT) TABS Take 1 tablet by mouth daily. 12/31/20   [provider]     Allergies:    No Known Allergies   Physical Exam:   Vitals  Blood pressure (!) 131/49, pulse 79, temperature 98.2 F (36.8 C), temperature  source Axillary, resp. rate (!) 33, height 5\' 2"  (1.575 m), weight 76.9 kg, SpO2 93%.   1. General Frail elderly female, laying in bed, no apparent distress  2. Normal affect and insight, Not Suicidal or Homicidal, Awake Alert, Oriented X 3.  3. No F.N deficits, ALL C.Nerves Intact, Strength 5/5 all 4 extremities, Sensation intact all 4 extremities, Plantars down going.  4. Ears and Eyes appear Normal, Conjunctivae clear, PERRLA. Moist Oral Mucosa.  5. Supple Neck, No JVD, No cervical lymphadenopathy appriciated, No Carotid Bruits.  6. Symmetrical Chest wall movement, Good air movement bilaterally, CTAB.  7. RRR, No Gallops, Rubs or Murmurs, No Parasternal Heave.  8. Positive Bowel Sounds, Abdomen Soft, No tenderness, No organomegaly appriciated,No rebound -guarding or rigidity.  9.  No Cyanosis, Normal Skin Turgor, No Skin Rash or Bruise.  10. Good muscle tone,  joints appear normal , no effusions, Normal ROM.   Data Review:    CBC Recent Labs  Lab 07/26/23 0951  WBC 4.5  HGB 12.3  HCT 36.5  PLT 152  MCV 97.3  MCH 32.8  MCHC 33.7  RDW 16.1*  LYMPHSABS 0.7  MONOABS 0.5  EOSABS 0.1  BASOSABS 0.0   ------------------------------------------------------------------------------------------------------------------  Chemistries  Recent Labs  Lab 07/26/23 0951  NA 138  K 3.5  CL 106  CO2 24  GLUCOSE 107*  BUN 16  CREATININE 0.67  CALCIUM 9.2   ------------------------------------------------------------------------------------------------------------------ estimated  creatinine clearance is 52.9 mL/min (by C-G formula based on SCr of 0.67 mg/dL). ------------------------------------------------------------------------------------------------------------------ No results for input(s): "TSH", "T4TOTAL", "T3FREE", "THYROIDAB" in the last 72 hours.  Invalid input(s): "FREET3"  Coagulation profile No results for input(s): "INR", "PROTIME" in the last 168 hours. ------------------------------------------------------------------------------------------------------------------- No results for input(s): "DDIMER" in the last 72 hours. -------------------------------------------------------------------------------------------------------------------  Cardiac Enzymes No results for input(s): "CKMB", "TROPONINI", "MYOGLOBIN" in the last 168 hours.  Invalid input(s): "CK" ------------------------------------------------------------------------------------------------------------------    Component Value Date/Time   BNP 269.0 (H) 07/26/2023 0953     ---------------------------------------------------------------------------------------------------------------  Urinalysis    Component Value Date/Time   COLORURINE YELLOW 07/03/2023 1203   APPEARANCEUR CLEAR 07/03/2023 1203   LABSPEC 1.009 07/03/2023 1203   PHURINE 5.0 07/03/2023 1203   GLUCOSEU NEGATIVE 07/03/2023 1203   HGBUR NEGATIVE 07/03/2023 1203   BILIRUBINUR NEGATIVE 07/03/2023 1203   KETONESUR NEGATIVE 07/03/2023 1203   PROTEINUR NEGATIVE 07/03/2023 1203   NITRITE NEGATIVE 07/03/2023 1203   LEUKOCYTESUR NEGATIVE 07/03/2023 1203    ----------------------------------------------------------------------------------------------------------------   Imaging Results:    DG Chest 2 View Result Date: 07/26/2023 CLINICAL DATA:  Chest pain. EXAM: CHEST - 2 VIEW COMPARISON:  February 09, 2023. FINDINGS: Mild cardiomegaly is noted with possible mild central pulmonary vascular congestion. No consolidative  process is noted. Bony thorax is unremarkable. IMPRESSION: Mild cardiomegaly with mild central pulmonary vascular congestion. Electronically Signed   By: Lupita Raider M.D.   On: 07/26/2023 10:21     EKG: Vent. rate 74 BPM PR interval 218 ms QRS duration 126 ms QT/QTcB 424/470 ms P-R-T axes 2 -12 164 Sinus rhythm with 1st degree A-V block Left ventricular hypertrophy with QRS widening and repolarization abnormality ( R in aVL , Sokolow-Lyon , Cornell product , Romhilt-Estes ) Cannot rule out Anteroseptal infarct , age undetermined Abnormal ECG When compared with ECG of 09-Feb-2023 13:23, PREVIOUS ECG IS PRESENT    Assessment & Plan:    Active Problems:   Essential hypertension   Gout   HOCM (hypertrophic obstructive cardiomyopathy) (HCC)   Rheumatoid arthritis (HCC)  GERD without esophagitis   Coronary artery disease   Other chest pain   Chest pain  Chest pain Elevated troponins CAD -Cardiology input greatly appreciated, symptoms felt most likely related to her HOCM -Her troponins are flat, not consistent with ACS. -Will update echo -Monitor symptoms overnight.LM 20%, LAD mid 30%, OM1 10%, RCA LIs  -Patient cardiac cath in July 2024, significant for continue with home aspirin, sublingual nitro, home statin and Zetia -Continue with aspirin, statin and Zetia  HOCM -Management per cardiology, recommendation to repeat echo  Rheumatoid arthritis -Hold methotrexate during hospital stay  Gout Continue with allopurinol   Hypertension:  -Continue home verapamil  Hyperlipidemia:  Resume statin and zetia.    GERD: Stable. On PPI.      DVT Prophylaxis Heparin  AM Labs Ordered, also please review Full Orders  Family Communication: Admission, patients condition and plan of care including tests being ordered have been discussed with the patient and (tried to reach both son and daughter by phone with no availability) who indicate understanding and agree with the plan  and Code Status.  Code Status full code  Likely DC to home cardiology  Consults called: Urology  Admission status: Observation  Time spent in minutes : 60 minutes   Huey Bienenstock M.D on 07/26/2023 at 5:52 PM   [facility] - Office  [phone]

## 2023-07-26 NOTE — Plan of Care (Signed)

## 2023-07-26 NOTE — Consult Note (Addendum)
Cardiology Consultation   Patient ID: [name] MRN: [id number]; DOB: October 12, 1940  Admit date: 07/26/2023 Date of Consult: 07/26/2023  PCP:  Kara Pacer, NP   [facility] Providers Cardiologist:  Christell Constant, MD   {      Patient Profile:   Dana [name] is a 83 y.o. female with a hx of CAD, HOCM, prior GI bleed, PSVT,  who is being seen 07/26/2023 for the evaluation of chest pain at the request of Dr Estell Harpin.  History of Present Illness:   Dana [name] 83 yo female history of CAD with prior PCI to OM1, HOCM, history of GI bleed, PSVT, presents with chest pain and SOB.Chest pain started yesterday while at rest. 6/10 pressure mid to left chest with some associated SOB. Lasted about to 1 hour. Intermittently throught the night and this morning has had some ongoing SOB but no recurrent chest pain.   -09/2022 admit with elevated trops in setting of GI bleed. Cath deferred at that time due to bleed. Thought likely demand ischemia in setting of anemia, HOCM, hypotension.   K 3.5 BUN 16 Cr 0.67 WBC 4.5 Hgb 12.3 Plt 152 BNP 269  Trop 155-->155 EKG SR, LVH with chronic strain pattern  11/2022 cath: LM 20%, LAD mid 30%, OM1 10%, RCA LIs 09/2022 echo: HOCM, LVEF 70-75%, dynamic gradient at rest.  04/2022 PET: no ischemia though decreased glbal MBF reserve Past Medical History:  Diagnosis Date   CAD (coronary artery disease)    a. s/p DES to OM1 in 02/2017 b. patent stent by cath in 11/2022   Chest pain    Essential hypertension    Gout    HOCM (hypertrophic obstructive cardiomyopathy) (HCC)    Hyperlipidemia    Rheumatoid arthritis (HCC)     Past Surgical History:  Procedure Laterality Date   BIOPSY  10/20/2022   Procedure: BIOPSY;  Surgeon: Tressia Danas, MD;  Location: [facility] ENDOSCOPY;  Service: Gastroenterology;;   CHOLECYSTECTOMY N/A 08/17/2018   Procedure: LAPAROSCOPIC CHOLECYSTECTOMY;  Surgeon: [name], MD;  Location: [facility] ORS;   Service: General;  Laterality: N/A;   COLONOSCOPY WITH PROPOFOL N/A 10/20/2022   Procedure: COLONOSCOPY WITH PROPOFOL;  Surgeon: Tressia Danas, MD;  Location: [facility] ENDOSCOPY;  Service: Gastroenterology;  Laterality: N/A;   CORONARY BALLOON ANGIOPLASTY N/A 03/03/2017   Procedure: CORONARY BALLOON ANGIOPLASTY;  Surgeon: Marykay Lex, MD;  Location: [facility] INVASIVE CV LAB;  Service: Cardiovascular;  Laterality: N/A;   CORONARY STENT INTERVENTION N/A 03/03/2017   Procedure: CORONARY STENT INTERVENTION;  Surgeon: Marykay Lex, MD;  Location: [facility] INVASIVE CV LAB;  Service: Cardiovascular;  Laterality: N/A;   ESOPHAGOGASTRODUODENOSCOPY (EGD) WITH PROPOFOL N/A 10/20/2022   Procedure: ESOPHAGOGASTRODUODENOSCOPY (EGD) WITH PROPOFOL;  Surgeon: Tressia Danas, MD;  Location: [facility] ENDOSCOPY;  Service: Gastroenterology;  Laterality: N/A;   LEFT HEART CATH AND CORONARY ANGIOGRAPHY N/A 03/02/2017   Procedure: LEFT HEART CATH AND CORONARY ANGIOGRAPHY;  Surgeon: Tonny Bollman, MD;  Location: [facility] INVASIVE CV LAB;  Service: Cardiovascular;  Laterality: N/A;   LEFT HEART CATH AND CORONARY ANGIOGRAPHY N/A 12/08/2022   Procedure: LEFT HEART CATH AND CORONARY ANGIOGRAPHY;  Surgeon: Orbie Pyo, MD;  Location: [facility] INVASIVE CV LAB;  Service: Cardiovascular;  Laterality: N/A;   POLYPECTOMY  10/20/2022   Procedure: POLYPECTOMY;  Surgeon: Tressia Danas, MD;  Location: [facility] ENDOSCOPY;  Service: Gastroenterology;;     Inpatient Medications: Scheduled Meds:  sodium chloride flush  3 mL Intravenous Q12H   Continuous Infusions:  PRN Meds:   Allergies:   No Known Allergies  Social History:   Social History   Socioeconomic History   Marital status: Widowed    Spouse name: Not on file   Number of children: Not on file   Years of education: Not on file   Highest education level: Not on file  Occupational History   Not on file  Tobacco Use   Smoking status: Never   Smokeless tobacco: Never  Vaping Use    Vaping status: Never Used  Substance and Sexual Activity   Alcohol use: No   Drug use: No   Sexual activity: Not Currently  Other Topics Concern   Not on file  Social History Narrative   Not on file   Social Drivers of Health   Financial Resource Strain: Not on file  Food Insecurity: No Food Insecurity (10/20/2022)   Hunger Vital Sign    Worried About Running Out of Food in the Last Year: Never true    Ran Out of Food in the Last Year: Never true  Transportation Needs: No Transportation Needs (10/20/2022)   PRAPARE - Administrator, Civil Service (Medical): No    Lack of Transportation (Non-Medical): No  Physical Activity: Not on file  Stress: Not on file  Social Connections: Not on file  Intimate Partner Violence: Not At Risk (10/20/2022)   Humiliation, Afraid, Rape, and Kick questionnaire    Fear of Current or Ex-Partner: No    Emotionally Abused: No    Physically Abused: No    Sexually Abused: No    Family History:    Family History  Problem Relation Age of Onset   Hypertension Mother    Liver disease Mother    Alcohol abuse Father      ROS:  Please see the history of present illness.   All other ROS reviewed and negative.     Physical Exam/Data:   Vitals:   07/26/23 0927 07/26/23 0927 07/26/23 1315  BP:  127/65 118/61  Pulse:  74 70  Resp:  18 (!) 25  Temp:  98.1 F (36.7 C)   SpO2:  96% 93%  Weight: 76.9 kg    Height: 5\' 2"  (1.575 m)     No intake or output data in the 24 hours ending 07/26/23 1405    07/26/2023    9:27 AM 07/13/2023    9:01 AM 07/03/2023   11:14 AM  Last 3 Weights  Weight (lbs) 169 lb 8.5 oz 169 lb 8.5 oz 169 lb  Weight (kg) 76.9 kg 76.9 kg 76.658 kg     Body mass index is 31.01 kg/m.  General:  Well nourished, well developed, in no acute distress HEENT: normal Neck: no JVD Vascular: No carotid bruits; Distal pulses 2+ bilaterally Cardiac: RRR, 2/6 systolic murmur rusb Lungs:  clear to auscultation bilaterally, no  wheezing, rhonchi or rales  Abd: soft, nontender, no hepatomegaly  Ext: no edema Musculoskeletal:  No deformities, BUE and BLE strength normal and equal Skin: warm and dry  Neuro:  CNs 2-12 intact, no focal abnormalities noted Psych:  Normal affect    Laboratory Data:  High Sensitivity Troponin:   Recent Labs  Lab 07/26/23 0951 07/26/23 1119  TROPONINIHS 155* 155*     Chemistry Recent Labs  Lab 07/26/23 0951  NA 138  K 3.5  CL 106  CO2 24  GLUCOSE 107*  BUN 16  CREATININE 0.67  CALCIUM 9.2  GFRNONAA >60  ANIONGAP 8  No results for input(s): "PROT", "ALBUMIN", "AST", "ALT", "ALKPHOS", "BILITOT" in the last 168 hours. Lipids No results for input(s): "CHOL", "TRIG", "HDL", "LABVLDL", "LDLCALC", "CHOLHDL" in the last 168 hours.  Hematology Recent Labs  Lab 07/26/23 0951  WBC 4.5  RBC 3.75*  HGB 12.3  HCT 36.5  MCV 97.3  MCH 32.8  MCHC 33.7  RDW 16.1*  PLT 152   Thyroid No results for input(s): "TSH", "FREET4" in the last 168 hours.  BNP Recent Labs  Lab 07/26/23 0953  BNP 269.0*    DDimer No results for input(s): "DDIMER" in the last 168 hours.   Radiology/Studies:  DG Chest 2 View Result Date: 07/26/2023 CLINICAL DATA:  Chest pain. EXAM: CHEST - 2 VIEW COMPARISON:  February 09, 2023. FINDINGS: Mild cardiomegaly is noted with possible mild central pulmonary vascular congestion. No consolidative process is noted. Bony thorax is unremarkable. IMPRESSION: Mild cardiomegaly with mild central pulmonary vascular congestion. Electronically Signed   By: Lupita Raider M.D.   On: 07/26/2023 10:21     Assessment and Plan:   Chest pain -multiple admissions with chronically elevated trops, chest pain. Often thought secondary to combinations of HOCM, hypotension, anemia during prior admits.  - 11/2022 cath: LM 20%, LAD mid 30%, OM1 10%, RCA LIs  - trops flat not consistent with ACS, I suspect most likely symptoms related to her HOCM. Will update echo - continue  home regimen - monitor symptoms overnight.   2. HOCM - repeat echo - followed closely by Dr Izora Ribas as outpatient - from notes had declined cMRI, ICD considerations.   3. CAD - PCI to OM1 - 11/2022 cath: LM 20%, LAD mid 30%, OM1 10%, RCA LIs - multiple admits with elevated trops - at this time do not suspect recurrent obstructive disease - f/u echo   For questions or updates, please contact [facility] Please consult [email or web address] for contact info under    Signed, Dina Rich, MD  07/26/2023 2:05 PM

## 2023-07-27 ENCOUNTER — Other Ambulatory Visit (HOSPITAL_COMMUNITY): Payer: Self-pay | Admitting: *Deleted

## 2023-07-27 ENCOUNTER — Observation Stay (HOSPITAL_COMMUNITY): Payer: Medicare Other

## 2023-07-27 DIAGNOSIS — I421 Obstructive hypertrophic cardiomyopathy: Secondary | ICD-10-CM

## 2023-07-27 DIAGNOSIS — R0789 Other chest pain: Secondary | ICD-10-CM | POA: Diagnosis not present

## 2023-07-27 DIAGNOSIS — I10 Essential (primary) hypertension: Secondary | ICD-10-CM | POA: Diagnosis not present

## 2023-07-27 DIAGNOSIS — R079 Chest pain, unspecified: Secondary | ICD-10-CM | POA: Diagnosis not present

## 2023-07-27 DIAGNOSIS — I25118 Atherosclerotic heart disease of native coronary artery with other forms of angina pectoris: Secondary | ICD-10-CM | POA: Diagnosis not present

## 2023-07-27 LAB — CBC
HCT: 34.2 % — ABNORMAL LOW (ref 36.0–46.0)
Hemoglobin: 11.4 g/dL — ABNORMAL LOW (ref 12.0–15.0)
MCH: 32.5 pg (ref 26.0–34.0)
MCHC: 33.3 g/dL (ref 30.0–36.0)
MCV: 97.4 fL (ref 80.0–100.0)
Platelets: 123 10*3/uL — ABNORMAL LOW (ref 150–400)
RBC: 3.51 MIL/uL — ABNORMAL LOW (ref 3.87–5.11)
RDW: 16.1 % — ABNORMAL HIGH (ref 11.5–15.5)
WBC: 3.5 10*3/uL — ABNORMAL LOW (ref 4.0–10.5)
nRBC: 0 % (ref 0.0–0.2)

## 2023-07-27 LAB — BASIC METABOLIC PANEL
Anion gap: 6 (ref 5–15)
BUN: 13 mg/dL (ref 8–23)
CO2: 25 mmol/L (ref 22–32)
Calcium: 8.3 mg/dL — ABNORMAL LOW (ref 8.9–10.3)
Chloride: 109 mmol/L (ref 98–111)
Creatinine, Ser: 0.69 mg/dL (ref 0.44–1.00)
GFR, Estimated: >60 mL/min (ref >60)
Glucose, Bld: 93 mg/dL (ref 70–99)
Potassium: 3.5 mmol/L (ref 3.5–5.1)
Sodium: 140 mmol/L (ref 135–145)

## 2023-07-27 LAB — ECHOCARDIOGRAM COMPLETE
AV Mean grad: 19.5 mm[Hg]
AV Peak grad: 37.1 mm[Hg]
Ao pk vel: 3.05 m/s
Area-P 1/2: 3.21 cm2
Height: 62 in
MV M vel: 4.52 m/s
MV Peak grad: 81.7 mm[Hg]
S' Lateral: 2.2 cm
Weight: 2712.54 [oz_av]

## 2023-07-27 MED ORDER — PANTOPRAZOLE SODIUM 40 MG PO TBEC: 40.0000 mg | DELAYED_RELEASE_TABLET | Freq: Every day | ORAL | Status: AC

## 2023-07-27 MED ORDER — NITROGLYCERIN 0.4 MG SL SUBL: 0.4000 mg | SUBLINGUAL_TABLET | SUBLINGUAL | Status: AC | PRN

## 2023-07-27 NOTE — Progress Notes (Signed)
Rounding Note    Patient Name: Dana [name] Date of Encounter: 07/27/2023  [facility] Cardiologist: Christell Constant, MD   Subjective   No complaints  Inpatient Medications    Scheduled Meds:  allopurinol  300 mg Oral Daily   aspirin EC  81 mg Oral Daily   atorvastatin  10 mg Oral Daily   cycloSPORINE  1 drop Both Eyes BID   ezetimibe  10 mg Oral Daily   fluticasone  1 spray Each Nare Daily   folic acid  1 mg Oral Daily   heparin  5,000 Units Subcutaneous Q8H   pantoprazole  40 mg Oral Daily   sucralfate  1 g Oral BID   verapamil  360 mg Oral Daily   Continuous Infusions:  PRN Meds: acetaminophen, nitroGLYCERIN, ondansetron (ZOFRAN) IV   Vital Signs    Vitals:   07/26/23 2143 07/26/23 2335 07/27/23 0500 07/27/23 0755  BP: (!) 116/54 (!) 121/56 123/71 (!) 159/85  Pulse: 74 78 81 81  Resp: 18 18 (!) 24   Temp: 98.9 F (37.2 C) 98.7 F (37.1 C) 98.8 F (37.1 C) 98.4 F (36.9 C)  TempSrc:  Oral  Oral  SpO2: 92% 94% 96% 100%  Weight:  76.9 kg    Height:        Intake/Output Summary (Last 24 hours) at 07/27/2023 0959 Last data filed at 07/27/2023 0900 Gross per 24 hour  Intake 720 ml  Output --  Net 720 ml      07/26/2023   11:35 PM 07/26/2023    9:27 AM 07/13/2023    9:01 AM  Last 3 Weights  Weight (lbs) 169 lb 8.5 oz 169 lb 8.5 oz 169 lb 8.5 oz  Weight (kg) 76.9 kg 76.9 kg 76.9 kg      Telemetry    SR - Personally Reviewed  ECG    N/a - Personally Reviewed  Physical Exam   GEN: No acute distress.   Neck: No JVD Cardiac: RRR, 2/6 systolic murmur rusb Respiratory: Clear to auscultation bilaterally. GI: Soft, nontender, non-distended  MS: No edema; No deformity. Neuro:  Nonfocal  Psych: Normal affect   Labs    High Sensitivity Troponin:   Recent Labs  Lab 07/26/23 0951 07/26/23 1119  TROPONINIHS 155* 155*     Chemistry Recent Labs  Lab 07/26/23 0951 07/27/23 0440  NA 138 140  K 3.5 3.5  CL 106 109  CO2 24  25  GLUCOSE 107* 93  BUN 16 13  CREATININE 0.67 0.69  CALCIUM 9.2 8.3*  GFRNONAA >60 >60  ANIONGAP 8 6    Lipids No results for input(s): "CHOL", "TRIG", "HDL", "LABVLDL", "LDLCALC", "CHOLHDL" in the last 168 hours.  Hematology Recent Labs  Lab 07/26/23 0951 07/27/23 0440  WBC 4.5 3.5*  RBC 3.75* 3.51*  HGB 12.3 11.4*  HCT 36.5 34.2*  MCV 97.3 97.4  MCH 32.8 32.5  MCHC 33.7 33.3  RDW 16.1* 16.1*  PLT 152 123*   Thyroid No results for input(s): "TSH", "FREET4" in the last 168 hours.  BNP Recent Labs  Lab 07/26/23 0953  BNP 269.0*    DDimer No results for input(s): "DDIMER" in the last 168 hours.   Radiology    DG Chest 2 View Result Date: 07/26/2023 CLINICAL DATA:  Chest pain. EXAM: CHEST - 2 VIEW COMPARISON:  February 09, 2023. FINDINGS: Mild cardiomegaly is noted with possible mild central pulmonary vascular congestion. No consolidative process is noted. Bony thorax is unremarkable.  IMPRESSION: Mild cardiomegaly with mild central pulmonary vascular congestion. Electronically Signed   By: Lupita Raider M.D.   On: 07/26/2023 10:21    Cardiac Studies    Patient Profile     Dana [name] is a 83 y.o. female with a hx of CAD, HOCM, prior GI bleed, PSVT,  who is being seen 07/26/2023 for the evaluation of chest pain at the request of Dr Estell Harpin.   Assessment & Plan    Chest pain -multiple admissions with chronically elevated trops, chest pain. Often thought secondary to combinations of HOCM, hypotension, anemia during prior admits.  - 11/2022 cath: LM 20%, LAD mid 30%, OM1 10%, RCA LIs  - Jan 2025 echo: LVEF 70-75%, no WMAs. HOCM resting gradient 21 mmHg and Valsavla .   - trops flat not consistent with ACS, I suspect most likely symptoms related to her HOCM.  - no plans or repeat ischemic testing at this time.  - symptoms have resolved since admission.     2. HOCM - followed closely by Dr Izora Ribas as outpatient - from notes had declined cMRI, ICD  considerations.  - Jan 2025 echo: LVEF 70-75%, no WMAs. HOCM resting gradient 21 mmHg and Valsavla .  - on verapamil, soft bp's at times would not add metoprolol at this time.  - reports intermittent episodes of SOB over the last few weeks. Isolated chest pain episode this admission.  - have her follow back up with primary cardiologist to reassess options.    3. CAD - PCI to OM1 - 11/2022 cath: LM 20%, LAD mid 30%, OM1 10%, RCA LIs - multiple admits with elevated trops - at this time do not suspect recurrent obstructive disease - no plans for repeat ischemic testing.    Ok for   For questions or updates, please contact [facility] Please consult [email or web address] for contact info under        Signed, Dina Rich, MD  07/27/2023, 9:59 AM

## 2023-07-27 NOTE — Plan of Care (Signed)
  Problem: Education: Goal: Understanding of cardiac disease, CV risk reduction, and recovery process will improve Outcome: Adequate for Discharge Goal: Individualized Educational Video(s) Outcome: Adequate for Discharge   Problem: Activity: Goal: Ability to tolerate increased activity will improve Outcome: Adequate for Discharge   Problem: Cardiac: Goal: Ability to achieve and maintain adequate cardiovascular perfusion will improve Outcome: Adequate for Discharge   Problem: Health Behavior/Discharge Planning: Goal: Ability to safely manage health-related needs after discharge will improve Outcome: Adequate for Discharge   Problem: Education: Goal: Knowledge of General Education information will improve Description: Including pain rating scale, medication(s)/side effects and non-pharmacologic comfort measures Outcome: Adequate for Discharge   Problem: Health Behavior/Discharge Planning: Goal: Ability to manage health-related needs will improve Outcome: Adequate for Discharge   Problem: Clinical Measurements: Goal: Ability to maintain clinical measurements within normal limits will improve Outcome: Adequate for Discharge Goal: Will remain free from infection Outcome: Adequate for Discharge Goal: Diagnostic test results will improve Outcome: Adequate for Discharge Goal: Respiratory complications will improve Outcome: Adequate for Discharge Goal: Cardiovascular complication will be avoided Outcome: Adequate for Discharge   Problem: Activity: Goal: Risk for activity intolerance will decrease Outcome: Adequate for Discharge   Problem: Nutrition: Goal: Adequate nutrition will be maintained Outcome: Adequate for Discharge   Problem: Coping: Goal: Level of anxiety will decrease Outcome: Adequate for Discharge   Problem: Elimination: Goal: Will not experience complications related to bowel motility Outcome: Adequate for Discharge Goal: Will not experience complications  related to urinary retention Outcome: Adequate for Discharge   Problem: Pain Managment: Goal: General experience of comfort will improve and/or be controlled Outcome: Adequate for Discharge   Problem: Safety: Goal: Ability to remain free from injury will improve Outcome: Adequate for Discharge   Problem: Skin Integrity: Goal: Risk for impaired skin integrity will decrease Outcome: Adequate for Discharge

## 2023-07-27 NOTE — Discharge Instructions (Signed)
IMPORTANT INFORMATION: PAY CLOSE ATTENTION   PHYSICIAN DISCHARGE INSTRUCTIONS  Follow with Primary care provider  Nsumanganyi, Colleen Can, NP  and other consultants as instructed by your Hospitalist Physician  SEEK MEDICAL CARE OR RETURN TO EMERGENCY ROOM IF SYMPTOMS COME BACK, WORSEN OR NEW PROBLEM DEVELOPS   Please note: You were cared for by a hospitalist during your hospital stay. Every effort will be made to forward records to your primary care provider.  You can request that your primary care provider send for your hospital records if they have not received them.  Once you are discharged, your primary care physician will handle any further medical issues. Please note that NO REFILLS for any discharge medications will be authorized once you are discharged, as it is imperative that you return to your primary care physician (or establish a relationship with a primary care physician if you do not have one) for your post hospital discharge needs so that they can reassess your need for medications and monitor your lab values.  Please get a complete blood count and chemistry panel checked by your Primary MD at your next visit, and again as instructed by your Primary MD.  Get Medicines reviewed and adjusted: Please take all your medications with you for your next visit with your Primary MD  Laboratory/radiological data: Please request your Primary MD to go over all hospital tests and procedure/radiological results at the follow up, please ask your primary care provider to get all Hospital records sent to his/her office.  In some cases, they will be blood work, cultures and biopsy results pending at the time of your discharge. Please request that your primary care provider follow up on these results.  If you are diabetic, please bring your blood sugar readings with you to your follow up appointment with primary care.    Please call and make your follow up appointments as soon as possible.     Also Note the following: If you experience worsening of your admission symptoms, develop shortness of breath, life threatening emergency, suicidal or homicidal thoughts you must seek medical attention immediately by calling 911 or calling your MD immediately  if symptoms less severe.  You must read complete instructions/literature along with all the possible adverse reactions/side effects for all the Medicines you take and that have been prescribed to you. Take any new Medicines after you have completely understood and accpet all the possible adverse reactions/side effects.   Do not drive when taking Pain medications or sleeping medications (Benzodiazepines)  Do not take more than prescribed Pain, Sleep and Anxiety Medications. It is not advisable to combine anxiety,sleep and pain medications without talking with your primary care practitioner  Special Instructions: If you have smoked or chewed Tobacco  in the last 2 yrs please stop smoking, stop any regular Alcohol  and or any Recreational drug use.  Wear Seat belts while driving.  Do not drive if taking any narcotic, mind altering or controlled substances or recreational drugs or alcohol.

## 2023-07-27 NOTE — Care Management Obs Status (Signed)
MEDICARE OBSERVATION STATUS NOTIFICATION   Patient Details  Name: Dana [name] MRN: [id number] Date of Birth: February 05, 1941   Medicare Observation Status Notification Given:  Yes Pearletha Furl., verbally reviewed observation notice with Alvetta Lufkin by using [employer] at [phone]. Copy provided.)    Corey Harold 07/27/2023, 3:29 PM

## 2023-07-27 NOTE — Progress Notes (Signed)
*  PRELIMINARY RESULTS* Echocardiogram 2D Echocardiogram has been performed.  Stacey Drain 07/27/2023, 2:15 PM

## 2023-07-27 NOTE — Discharge Summary (Addendum)
Physician Discharge Summary  Dana [name] ZDG:[id number] DOB: [birth year] DOA: 07/26/2023  PCP: Kara Pacer, NP Cardiologist: Izora Ribas  Admit date: 07/26/2023 Discharge date: 07/27/2023  Admitted From:  Home Disposition: Home  Recommendations for Outpatient Follow-up:  Follow up with cardiologist in 1-2 weeks  Discharge Condition: STABLE   CODE STATUS: FULL DIET: resume heart healthy   Brief Hospitalization Summary: Please see all hospital notes, images, labs for full details of the hospitalization. 83 y.o. female,with medical history significant of hypertension, hyperlipidemia,HOCM, CAD s/p stent placement, GERD. -Patient with admission 4/24 secondary to GI bleed. -Patient presents to ED secondary to complaints of chest pain, some shortness of breath, reports chest pain started yesterday, at rest, 6 out of 10 intensity, pressure-like quality, nonradiating, at 1 to 2 hours, resolved without intervention, came to ED for further evaluation -In ED she is currently chest pain-free, EKG nonacute, troponinsElevated, which is at her baseline, but stable 155>155, BNP mildly elevated at 269, was seen by cardiology, presentation most likely in the setting of HOCM, they recommend admission for observation, and updating echo, so [facility] was called to admit.   Hospital course Pt was admitted for observation for symptoms of atypical chest pain and was monitored on telemetry which has remained stable.  She was ruled out for ACS.  She was evaluated by cardiologist and had a 2D echocardiogram with findings of LVEF 70-75%, no WMAs.  I spoke with Dr. Wyline Mood and he said ok to discharge home today, no changes to medications, and follow up with Dr. Izora Ribas her cardiologist.  Pt is feeling fine and discharging home in stable condition.   Discharge Diagnoses:  Active Problems:   Essential hypertension   Gout   HOCM (hypertrophic obstructive cardiomyopathy) (HCC)   Rheumatoid  arthritis (HCC)   GERD without esophagitis   Coronary artery disease   Other chest pain   Chest pain   Discharge Instructions:  Allergies as of 07/27/2023   No Known Allergies      Medication List     TAKE these medications    acetaminophen 325 MG tablet Commonly known as: TYLENOL Take 2 tablets (650 mg total) by mouth every 6 (six) hours as needed for mild pain, fever or headache (or Fever >/= 101).   alendronate 70 MG tablet Commonly known as: FOSAMAX Take 70 mg by mouth every Wednesday.   allopurinol 300 MG tablet Commonly known as: ZYLOPRIM Take 300 mg by mouth daily.   atorvastatin 10 MG tablet Commonly known as: LIPITOR Take 10 mg by mouth at bedtime.   ezetimibe 10 MG tablet Commonly known as: ZETIA Take 10 mg by mouth daily.   ferrous sulfate 325 (65 FE) MG tablet Take 325 mg by mouth 2 (two) times daily.   fluticasone 50 MCG/ACT nasal spray Commonly known as: FLONASE Place 1 spray into both nostrils daily as needed for allergies.   folic acid 1 MG tablet Commonly known as: FOLVITE Take 1 mg by mouth daily.   furosemide 20 MG tablet Commonly known as: LASIX Take 20 mg by mouth daily as needed for fluid.   loratadine 10 MG tablet Commonly known as: CLARITIN Take 10 mg by mouth daily.   methotrexate 2.5 MG tablet Commonly known as: RHEUMATREX Take 20 mg by mouth every Wednesday.   nitroGLYCERIN 0.4 MG SL tablet Commonly known as: NITROSTAT Place 1 tablet (0.4 mg total) under the tongue every 5 (five) minutes as needed for chest pain. What changed: See the new instructions.  pantoprazole 40 MG tablet Commonly known as: PROTONIX Take 1 tablet (40 mg total) by mouth daily before breakfast.   Restasis 0.05 % ophthalmic emulsion Generic drug: cycloSPORINE Place 1 drop into both eyes 2 (two) times daily.   sucralfate 1 g tablet Commonly known as: CARAFATE Take 1 g by mouth 2 (two) times daily.   verapamil 360 MG 24 hr capsule Commonly  known as: VERELAN TAKE (1) CAPSULE BY MOUTH AT BEDTIME.   VITAMIN D-3 PO Take 1 tablet by mouth daily.        Follow-up Information     Christell Constant, MD. Schedule an appointment as soon as possible for a visit in 2 week(s).   Specialty: Cardiology Why: Hospital Follow Up Contact information: [address] [place] [address] [phone]                No Known Allergies Allergies as of 07/27/2023   No Known Allergies      Medication List     TAKE these medications    acetaminophen 325 MG tablet Commonly known as: TYLENOL Take 2 tablets (650 mg total) by mouth every 6 (six) hours as needed for mild pain, fever or headache (or Fever >/= 101).   alendronate 70 MG tablet Commonly known as: FOSAMAX Take 70 mg by mouth every Wednesday.   allopurinol 300 MG tablet Commonly known as: ZYLOPRIM Take 300 mg by mouth daily.   atorvastatin 10 MG tablet Commonly known as: LIPITOR Take 10 mg by mouth at bedtime.   ezetimibe 10 MG tablet Commonly known as: ZETIA Take 10 mg by mouth daily.   ferrous sulfate 325 (65 FE) MG tablet Take 325 mg by mouth 2 (two) times daily.   fluticasone 50 MCG/ACT nasal spray Commonly known as: FLONASE Place 1 spray into both nostrils daily as needed for allergies.   folic acid 1 MG tablet Commonly known as: FOLVITE Take 1 mg by mouth daily.   furosemide 20 MG tablet Commonly known as: LASIX Take 20 mg by mouth daily as needed for fluid.   loratadine 10 MG tablet Commonly known as: CLARITIN Take 10 mg by mouth daily.   methotrexate 2.5 MG tablet Commonly known as: RHEUMATREX Take 20 mg by mouth every Wednesday.   nitroGLYCERIN 0.4 MG SL tablet Commonly known as: NITROSTAT Place 1 tablet (0.4 mg total) under the tongue every 5 (five) minutes as needed for chest pain. What changed: See the new instructions.   pantoprazole 40 MG tablet Commonly known as: PROTONIX Take 1 tablet (40 mg total) by  mouth daily before breakfast.   Restasis 0.05 % ophthalmic emulsion Generic drug: cycloSPORINE Place 1 drop into both eyes 2 (two) times daily.   sucralfate 1 g tablet Commonly known as: CARAFATE Take 1 g by mouth 2 (two) times daily.   verapamil 360 MG 24 hr capsule Commonly known as: VERELAN TAKE (1) CAPSULE BY MOUTH AT BEDTIME.   VITAMIN D-3 PO Take 1 tablet by mouth daily.        Procedures/Studies: ECHOCARDIOGRAM COMPLETE Result Date: 07/27/2023    ECHOCARDIOGRAM REPORT   Patient Name:   Dana [name] Date of Exam: 07/27/2023 Medical Rec #:  [id number]    Height:       62.0 in Accession #:    [id number]   Weight:       169.5 lb Date of Birth:  Oct 30, 1940    BSA:          1.782  m Patient Age:    82 years     BP:           159/85 mmHg Patient Gender: F            HR:           74 bpm. [facility] Procedure: 2D Echo, Cardiac Doppler and Color Doppler Indications:    Chest Paun R07.9  History:        Patient has prior history of Echocardiogram examinations, most                 recent 10/18/2022. CAD; Risk Factors:Dyslipidemia and                 Hypertension. HOCM (hypertrophic obstructive cardiomyopathy)                 (HCC).  Sonographer:    Celesta Gentile RCS Referring Phys: [id number] Dorothe Pea BRANCH IMPRESSIONS  1. Severe basal septal hypertrophy consistent with HOCM. There is SAM of the anterior MV leaflet. Resting dynamic gradient of 21 mmHg, with Valsalva increases to . [name] Left ventricular ejection fraction, by estimation, is 70 to 75%. The left ventricle has hyperdynamic function. The left ventricle has no regional wall motion abnormalities. There is severe asymmetric left ventricular hypertrophy of the basal-septal segment. Left ventricular diastolic parameters are consistent with Grade II diastolic dysfunction (pseudonormalization). Elevated left atrial pressure.  2. Right ventricular systolic function is normal. The right ventricular size is normal. There is normal  pulmonary artery systolic pressure.  3. Left atrial size was severely dilated.  4. The mitral valve is abnormal. Mild to moderate mitral valve regurgitation. No evidence of mitral stenosis.  5. The tricuspid valve is abnormal.  6. The aortic valve is tricuspid. There is moderate calcification of the aortic valve. There is moderate thickening of the aortic valve. Aortic valve regurgitation is not visualized. Mild aortic valve stenosis.  7. The inferior vena cava is normal in size with greater than 50% respiratory variability, suggesting right atrial pressure of 3 mmHg. FINDINGS  Left Ventricle: Severe basal septal hypertrophy consistent with HOCM. There is SAM of the anterior MV leaflet. Resting dynamic gradient of 21 mmHg, with Valsalva increases to . Left ventricular ejection fraction, by estimation, is 70 to 75%. The left ventricle has hyperdynamic function. The left ventricle has no regional wall motion abnormalities. The left ventricular internal cavity size was normal in size. There is severe asymmetric left ventricular hypertrophy of the basal-septal segment. Left ventricular diastolic parameters are consistent with Grade II diastolic dysfunction (pseudonormalization). Elevated left atrial pressure. Right Ventricle: The right ventricular size is normal. Right vetricular wall thickness was not well visualized. Right ventricular systolic function is normal. There is normal pulmonary artery systolic pressure. The tricuspid regurgitant velocity is 2.68 m/s, and with an assumed right atrial pressure of 3 mmHg, the estimated right ventricular systolic pressure is 31.7 mmHg. Left Atrium: Left atrial size was severely dilated. Right Atrium: Right atrial size was normal in size. Pericardium: There is no evidence of pericardial effusion. Mitral Valve: The mitral valve is abnormal. Mild to moderate mitral valve regurgitation. No evidence of mitral valve stenosis. MV peak gradient, 10.5 mmHg. The mean mitral valve  gradient is 3.0 mmHg. Tricuspid Valve: The tricuspid valve is abnormal. Tricuspid valve regurgitation is mild . No evidence of tricuspid stenosis. Aortic Valve: The aortic valve is tricuspid. There is moderate calcification of the aortic valve. There is moderate thickening of the  aortic valve. There is moderate aortic valve annular calcification. Aortic valve regurgitation is not visualized. Mild aortic stenosis is present. Aortic valve mean gradient measures 19.5 mmHg. Aortic valve peak gradient measures 37.1 mmHg. Pulmonic Valve: The pulmonic valve was not well visualized. Pulmonic valve regurgitation is not visualized. No evidence of pulmonic stenosis. Aorta: The aortic root is normal in size and structure. Venous: The inferior vena cava is normal in size with greater than 50% respiratory variability, suggesting right atrial pressure of 3 mmHg. IAS/Shunts: No atrial level shunt detected by color flow Doppler.  LEFT VENTRICLE PLAX 2D LVIDd:         4.00 cm   Diastology LVIDs:         2.20 cm   LV e' medial:    4.03 cm/s LV PW:         1.30 cm   LV E/e' medial:  35.7 LV IVS:        1.80 cm   LV e' lateral:   6.74 cm/s LVOT diam:     1.70 cm   LV E/e' lateral: 21.4 LVOT Area:     2.27 cm  RIGHT VENTRICLE RV S prime:     9.79 cm/s TAPSE (M-mode): 2.2 cm LEFT ATRIUM             Index        RIGHT ATRIUM           Index LA diam:        4.30 cm 2.41 cm/m   RA Area:     18.20 cm LA Vol (A2C):   93.5 ml 52.47 ml/m  RA Volume:   52.10 ml  29.24 ml/m LA Vol (A4C):   89.9 ml 50.45 ml/m LA Biplane Vol: 97.7 ml 54.83 ml/m  AORTIC VALVE AV Vmax:      304.50 cm/s AV Vmean:     199.000 cm/s AV VTI:       0.673 m AV Peak Grad: 37.1 mmHg AV Mean Grad: 19.5 mmHg  AORTA Ao Root diam: 3.10 cm MITRAL VALVE                TRICUSPID VALVE MV Area (PHT): 3.21 cm     TR Peak grad:   28.7 mmHg MV Peak grad:  10.5 mmHg    TR Vmax:        268.00 cm/s MV Mean grad:  3.0 mmHg MV Vmax:       1.62 m/s     SHUNTS MV Vmean:      72.4 cm/s     Systemic Diam: 1.70 cm MV Decel Time: 236 msec MR Peak grad: 81.7 mmHg MR Mean grad: 48.0 mmHg MR Vmax:      452.00 cm/s MR Vmean:     319.0 cm/s MV E velocity: 144.00 cm/s MV A velocity: 64.30 cm/s MV E/A ratio:  2.24 Dina Rich MD Electronically signed by Dina Rich MD Signature Date/Time: 07/27/2023/2:25:11 PM    Final    DG Chest 2 View Result Date: 07/26/2023 CLINICAL DATA:  Chest pain. EXAM: CHEST - 2 VIEW COMPARISON:  February 09, 2023. FINDINGS: Mild cardiomegaly is noted with possible mild central pulmonary vascular congestion. No consolidative process is noted. Bony thorax is unremarkable. IMPRESSION: Mild cardiomegaly with mild central pulmonary vascular congestion. Electronically Signed   By: Lupita Raider M.D.   On: 07/26/2023 10:21   CT ABDOMEN PELVIS W CONTRAST Result Date: 07/03/2023 CLINICAL DATA:  Flank pain. EXAM: CT ABDOMEN AND PELVIS  WITH CONTRAST TECHNIQUE: Multidetector CT imaging of the abdomen and pelvis was performed using the standard protocol following bolus administration of intravenous contrast. RADIATION DOSE REDUCTION: This exam was performed according to the departmental dose-optimization program which includes automated exposure control, adjustment of the mA and/or kV according to patient size and/or use of iterative reconstruction technique. CONTRAST:  OMNIPAQUE IOHEXOL 300 MG/ML  SOLN COMPARISON:  CT of the abdomen and pelvis without contrast on 02/09/2023 FINDINGS: Lower chest: No acute abnormality. Hepatobiliary: Stable and normal appearance of the liver. Status post cholecystectomy. Stable dilatation of the common bile duct after prior cholecystectomy. Pancreas: Unremarkable. No pancreatic ductal dilatation or surrounding inflammatory changes. Spleen: New small subcapsular fluid collection of the posterior spleen measuring up to approximately 2.7 x 1.8 x 3.8 cm. Associated mild amount of residual stranding along the inferior tip of the spleen. The  subcapsular collection is of fairly low density and most likely etiology is the residua of focal hemorrhage. On the prior CT, there was some stranding along the inferior and posterior aspect of the spleen and this likely represented a small amount of hemorrhage at that time which has likely coalesced into a small chronic fluid collection. Adrenals/Urinary Tract: Adrenal glands are unremarkable. Kidneys are normal, without renal calculi, focal lesion, or hydronephrosis. Bladder is unremarkable. Stomach/Bowel: Bowel shows no evidence of obstruction, ileus, inflammation or lesion. The appendix is not visualized. No free intraperitoneal air. Vascular/Lymphatic: Atherosclerosis of the abdominal aorta without aneurysm. No lymphadenopathy identified. Reproductive: Small amount of fluid in the endometrial cavity. Correlation suggested with any history of postmenopausal bleeding. This may require further elective gynecologic workup to exclude endometrial malignancy. Evidence of vaginal prolapse at the pelvic floor. Other: No abdominal wall hernia or abnormality. No abdominopelvic ascites. Musculoskeletal: No acute or significant osseous findings. IMPRESSION: 1. New small subcapsular fluid collection of the posterior spleen measuring up to approximately 2.7 x 1.8 x 3.8 cm. Associated mild amount of residual stranding along the inferior tip of the spleen. The subcapsular collection is of fairly low density and most likely etiology is the residua of focal hemorrhage. On the prior CT, there was some stranding along the inferior and posterior aspect of the spleen and this likely represented a small amount of hemorrhage at that time which has likely coalesced into a small chronic fluid collection. 2. Small amount of fluid in the endometrial cavity. Correlation suggested with any history of postmenopausal bleeding. This may require further elective gynecologic workup to exclude endometrial malignancy. Correlation with elective  pelvic ultrasound may be helpful. 3. Evidence of vaginal prolapse at the pelvic floor. 4. Aortic atherosclerosis. Aortic Atherosclerosis (ICD10-I70.0). Electronically Signed   By: Irish Lack M.D.   On: 07/03/2023 14:27     Subjective: No further chest pain symptoms, no SOB.   Discharge Exam: Vitals:   07/27/23 0755 07/27/23 1411  BP: (!) 159/85 (!) 96/58  Pulse: 81 64  Resp:  19  Temp: 98.4 F (36.9 C) 98.2 F (36.8 C)  SpO2: 100% 97%   Vitals:   07/26/23 2335 07/27/23 0500 07/27/23 0755 07/27/23 1411  BP: (!) 121/56 123/71 (!) 159/85 (!) 96/58  Pulse: 78 81 81 64  Resp: 18 (!) 24  19  Temp: 98.7 F (37.1 C) 98.8 F (37.1 C) 98.4 F (36.9 C) 98.2 F (36.8 C)  TempSrc: Oral  Oral Oral  SpO2: 94% 96% 100% 97%  Weight: 76.9 kg     Height:        General: Pt  is alert, awake, not in acute distress Cardiovascular: RRR, S1/S2 +, no rubs, no gallops Respiratory: CTA bilaterally, no wheezing, no rhonchi Abdominal: Soft, NT, ND, bowel sounds + Extremities: no edema, no cyanosis   The results of significant diagnostics from this hospitalization (including imaging, microbiology, ancillary and laboratory) are listed below for reference.     Microbiology: No results found for this or any previous visit (from the past 240 hours).   Labs: BNP (last 3 results) Recent Labs    10/15/22 2100 07/26/23 0953  BNP 474.0* 269.0*   Basic Metabolic Panel: Recent Labs  Lab 07/26/23 0951 07/27/23 0440  NA 138 140  K 3.5 3.5  CL 106 109  CO2 24 25  GLUCOSE 107* 93  BUN 16 13  CREATININE 0.67 0.69  CALCIUM 9.2 8.3*   Liver Function Tests: No results for input(s): "AST", "ALT", "ALKPHOS", "BILITOT", "PROT", "ALBUMIN" in the last 168 hours. No results for input(s): "LIPASE", "AMYLASE" in the last 168 hours. No results for input(s): "AMMONIA" in the last 168 hours. CBC: Recent Labs  Lab 07/26/23 0951 07/27/23 0440  WBC 4.5 3.5*  NEUTROABS 3.2  --   HGB 12.3 11.4*   HCT 36.5 34.2*  MCV 97.3 97.4  PLT 152 123*   Cardiac Enzymes: No results for input(s): "CKTOTAL", "CKMB", "CKMBINDEX", "TROPONINI" in the last 168 hours. BNP: Invalid input(s): "POCBNP" CBG: No results for input(s): "GLUCAP" in the last 168 hours. D-Dimer No results for input(s): "DDIMER" in the last 72 hours. Hgb A1c No results for input(s): "HGBA1C" in the last 72 hours. Lipid Profile No results for input(s): "CHOL", "HDL", "LDLCALC", "TRIG", "CHOLHDL", "LDLDIRECT" in the last 72 hours. Thyroid function studies No results for input(s): "TSH", "T4TOTAL", "T3FREE", "THYROIDAB" in the last 72 hours.  Invalid input(s): "FREET3" Anemia work up No results for input(s): "VITAMINB12", "FOLATE", "FERRITIN", "TIBC", "IRON", "RETICCTPCT" in the last 72 hours. Urinalysis    Component Value Date/Time   COLORURINE YELLOW 07/03/2023 1203   APPEARANCEUR CLEAR 07/03/2023 1203   LABSPEC 1.009 07/03/2023 1203   PHURINE 5.0 07/03/2023 1203   GLUCOSEU NEGATIVE 07/03/2023 1203   HGBUR NEGATIVE 07/03/2023 1203   BILIRUBINUR NEGATIVE 07/03/2023 1203   KETONESUR NEGATIVE 07/03/2023 1203   PROTEINUR NEGATIVE 07/03/2023 1203   NITRITE NEGATIVE 07/03/2023 1203   LEUKOCYTESUR NEGATIVE 07/03/2023 1203   Sepsis Labs Recent Labs  Lab 07/26/23 0951 07/27/23 0440  WBC 4.5 3.5*   Microbiology No results found for this or any previous visit (from the past 240 hours).  Time coordinating discharge:  25 mins  SIGNED:  Standley Dakins, MD  [facility] 07/27/2023, 3:31 PM How to contact the [facility] Attending or Consulting provider 7A - 7P or covering provider during after hours 7P -7A, for this patient?  Check the care team in [facility] and look for a) attending/consulting [facility] provider listed and b) the [facility] team listed Log into [email or web address] and use [facility]'s universal password to access. If you do not have the password, please contact the hospital operator. Locate the [facility] provider you are  looking for under [facility] and page to a number that you can be directly reached. If you still have difficulty reaching the provider, please page the [facility] (Director on Call) for the Hospitalists listed on amion for assistance.

## 2023-07-27 NOTE — Progress Notes (Signed)
   07/27/23 0957  TOC Brief Assessment  Insurance and Status Reviewed  Patient has primary care physician Yes  Home environment has been reviewed from home with family  Prior level of function: independent  Prior/Current Home Services No current home services  Social Drivers of Health Review SDOH reviewed no interventions necessary  Readmission risk has been reviewed Yes  Transition of care needs no transition of care needs at this time     Transition of Care Department [facility]) has reviewed patient and no TOC needs have been identified at this time. We will continue to monitor patient advancement through interdisciplinary progression rounds. If new patient transition needs arise, please place a TOC consult.

## 2023-08-09 ENCOUNTER — Telehealth: Payer: Self-pay

## 2023-08-09 NOTE — Telephone Encounter (Signed)
-----   Message from Christell Constant sent at 07/30/2023  2:48 PM EST ----- Regarding: RE: HOCM Clinic F/U [facility], can we put her on metoprolol succinate 25 mg PO nightly and bring her back later this month in follow up?  [place], historically she has sx when she doesn't take her lasix or has a GI Bleed.  Any issues this time that lead up to this?  Last time I saw her she did not want to do anything, but we can re-visit.  Thanks, MAC ----- Message ----- From: Carlyon Prows Sent: 07/28/2023   9:42 AM EST To: Christell Constant, MD; # Subject: HOCM Clinic F/U                                Good morning,   Dr. Wyline Mood wants this patient to have hospital follow-up with Dr. Izora Ribas in his HOCM Clinic. She is already an established patient of his and was discharged yesterday. I am tagging him so he can perhaps given [place] some guidance on where to schedule as I was not sure where to put her.   Thanks,  [place]

## 2023-08-09 NOTE — Telephone Encounter (Signed)
Called pt review MD recommendations.  Was advised by primary number contact to reach out to [facility] as he is dealing with a cancer dx.  Left a message for Sheryle Hail to call our office.

## 2023-08-14 NOTE — Telephone Encounter (Signed)
 Left a message to call back.

## 2023-08-24 NOTE — Telephone Encounter (Signed)
 3rd attempt at reaching pt daughter with no call back.

## 2024-01-01 ENCOUNTER — Other Ambulatory Visit: Payer: Self-pay

## 2024-01-01 ENCOUNTER — Observation Stay (HOSPITAL_COMMUNITY)
Admission: EM | Admit: 2024-01-01 | Discharge: 2024-01-02 | Disposition: A | Discharge status: Home / Self Care | Attending: Internal Medicine | Admitting: Internal Medicine

## 2024-01-01 ENCOUNTER — Encounter: Payer: Self-pay | Admitting: Hematology

## 2024-01-01 ENCOUNTER — Emergency Department (HOSPITAL_COMMUNITY)

## 2024-01-01 ENCOUNTER — Other Ambulatory Visit (HOSPITAL_COMMUNITY): Payer: Self-pay

## 2024-01-01 ENCOUNTER — Inpatient Hospital Stay (HOSPITAL_COMMUNITY)

## 2024-01-01 ENCOUNTER — Encounter (HOSPITAL_COMMUNITY): Payer: Self-pay

## 2024-01-01 ENCOUNTER — Telehealth (HOSPITAL_COMMUNITY): Payer: Self-pay | Admitting: Pharmacy Technician

## 2024-01-01 DIAGNOSIS — I4891 Unspecified atrial fibrillation: Secondary | ICD-10-CM | POA: Diagnosis not present

## 2024-01-01 DIAGNOSIS — I25118 Atherosclerotic heart disease of native coronary artery with other forms of angina pectoris: Secondary | ICD-10-CM

## 2024-01-01 DIAGNOSIS — E66811 Obesity, class 1: Secondary | ICD-10-CM | POA: Diagnosis not present

## 2024-01-01 DIAGNOSIS — J810 Acute pulmonary edema: Secondary | ICD-10-CM | POA: Diagnosis not present

## 2024-01-01 DIAGNOSIS — I10 Essential (primary) hypertension: Secondary | ICD-10-CM | POA: Diagnosis not present

## 2024-01-01 DIAGNOSIS — K219 Gastro-esophageal reflux disease without esophagitis: Secondary | ICD-10-CM | POA: Insufficient documentation

## 2024-01-01 DIAGNOSIS — I2510 Atherosclerotic heart disease of native coronary artery without angina pectoris: Secondary | ICD-10-CM | POA: Insufficient documentation

## 2024-01-01 DIAGNOSIS — M109 Gout, unspecified: Secondary | ICD-10-CM | POA: Diagnosis not present

## 2024-01-01 DIAGNOSIS — I110 Hypertensive heart disease with heart failure: Secondary | ICD-10-CM | POA: Diagnosis not present

## 2024-01-01 DIAGNOSIS — M069 Rheumatoid arthritis, unspecified: Secondary | ICD-10-CM | POA: Insufficient documentation

## 2024-01-01 DIAGNOSIS — R079 Chest pain, unspecified: Secondary | ICD-10-CM | POA: Diagnosis present

## 2024-01-01 DIAGNOSIS — E782 Mixed hyperlipidemia: Secondary | ICD-10-CM | POA: Diagnosis not present

## 2024-01-01 DIAGNOSIS — R0609 Other forms of dyspnea: Secondary | ICD-10-CM

## 2024-01-01 DIAGNOSIS — J9601 Acute respiratory failure with hypoxia: Secondary | ICD-10-CM | POA: Diagnosis not present

## 2024-01-01 DIAGNOSIS — I480 Paroxysmal atrial fibrillation: Secondary | ICD-10-CM

## 2024-01-01 DIAGNOSIS — I459 Conduction disorder, unspecified: Secondary | ICD-10-CM | POA: Diagnosis present

## 2024-01-01 DIAGNOSIS — I5033 Acute on chronic diastolic (congestive) heart failure: Principal | ICD-10-CM | POA: Insufficient documentation

## 2024-01-01 LAB — ECHOCARDIOGRAM COMPLETE
AV Mean grad: 22.5 mmHg
AV Peak grad: 42.5 mmHg
Ao pk vel: 3.26 m/s
Area-P 1/2: 5.23 cm2
Height: 64 in
S' Lateral: 2.2 cm
Weight: 2825.42 [oz_av]

## 2024-01-01 LAB — COMPREHENSIVE METABOLIC PANEL WITH GFR
ALT: 13 U/L (ref 0–44)
AST: 25 U/L (ref 15–41)
Albumin: 3.4 g/dL — ABNORMAL LOW (ref 3.5–5.0)
Alkaline Phosphatase: 63 U/L (ref 38–126)
Anion gap: 13 (ref 5–15)
BUN: 18 mg/dL (ref 8–23)
CO2: 22 mmol/L (ref 22–32)
Calcium: 8.5 mg/dL — ABNORMAL LOW (ref 8.9–10.3)
Chloride: 107 mmol/L (ref 98–111)
Creatinine, Ser: 0.79 mg/dL (ref 0.44–1.00)
GFR, Estimated: >60 mL/min (ref >60)
Glucose, Bld: 104 mg/dL — ABNORMAL HIGH (ref 70–99)
Potassium: 3.4 mmol/L — ABNORMAL LOW (ref 3.5–5.1)
Sodium: 142 mmol/L (ref 135–145)
Total Bilirubin: 0.8 mg/dL (ref 0.0–1.2)
Total Protein: 7 g/dL (ref 6.5–8.1)

## 2024-01-01 LAB — CBC WITH DIFFERENTIAL/PLATELET
Abs Immature Granulocytes: 0.01 K/uL (ref 0.00–0.07)
Basophils Absolute: 0.1 K/uL (ref 0.0–0.1)
Basophils Relative: 1 %
Eosinophils Absolute: 0.2 K/uL (ref 0.0–0.5)
Eosinophils Relative: 3 %
HCT: 38.6 % (ref 36.0–46.0)
Hemoglobin: 13.2 g/dL (ref 12.0–15.0)
Immature Granulocytes: 0 %
Lymphocytes Relative: 16 %
Lymphs Abs: 1.1 K/uL (ref 0.7–4.0)
MCH: 34.5 pg — ABNORMAL HIGH (ref 26.0–34.0)
MCHC: 34.2 g/dL (ref 30.0–36.0)
MCV: 100.8 fL — ABNORMAL HIGH (ref 80.0–100.0)
Monocytes Absolute: 0.6 K/uL (ref 0.1–1.0)
Monocytes Relative: 9 %
Neutro Abs: 4.7 K/uL (ref 1.7–7.7)
Neutrophils Relative %: 71 %
Platelets: 166 K/uL (ref 150–400)
RBC: 3.83 MIL/uL — ABNORMAL LOW (ref 3.87–5.11)
RDW: 14.9 % (ref 11.5–15.5)
WBC: 6.7 K/uL (ref 4.0–10.5)
nRBC: 0 % (ref 0.0–0.2)

## 2024-01-01 LAB — BRAIN NATRIURETIC PEPTIDE: B Natriuretic Peptide: 595 pg/mL — ABNORMAL HIGH (ref 0.0–100.0)

## 2024-01-01 LAB — BASIC METABOLIC PANEL WITH GFR
Anion gap: 10 (ref 5–15)
BUN: 17 mg/dL (ref 8–23)
CO2: 25 mmol/L (ref 22–32)
Calcium: 8.2 mg/dL — ABNORMAL LOW (ref 8.9–10.3)
Chloride: 104 mmol/L (ref 98–111)
Creatinine, Ser: 0.79 mg/dL (ref 0.44–1.00)
GFR, Estimated: >60 mL/min (ref >60)
Glucose, Bld: 118 mg/dL — ABNORMAL HIGH (ref 70–99)
Potassium: 3.3 mmol/L — ABNORMAL LOW (ref 3.5–5.1)
Sodium: 139 mmol/L (ref 135–145)

## 2024-01-01 LAB — TSH: TSH: 2.694 u[IU]/mL (ref 0.350–4.500)

## 2024-01-01 LAB — TROPONIN I (HIGH SENSITIVITY)
Troponin I (High Sensitivity): 255 ng/L (ref <18)
Troponin I (High Sensitivity): 231 ng/L (ref <18)

## 2024-01-01 LAB — MRSA NEXT GEN BY PCR, NASAL: MRSA by PCR Next Gen: NOT DETECTED

## 2024-01-01 MED ORDER — APIXABAN 5 MG PO TABS: 5.0000 mg | ORAL_TABLET | Freq: Two times a day (BID) | ORAL | Status: DC

## 2024-01-01 MED ORDER — ALLOPURINOL 300 MG PO TABS
300.0000 mg | ORAL_TABLET | Freq: Every day | ORAL | Status: DC
  Administered 2024-01-01 – 2024-01-02 (×2): 300 mg via ORAL
  Filled 2024-01-01 (×2): qty 1

## 2024-01-01 MED ORDER — CHLORHEXIDINE GLUCONATE CLOTH 2 % EX PADS
6.0000 | MEDICATED_PAD | Freq: Every day | CUTANEOUS | Status: DC
  Administered 2024-01-01: 6 via TOPICAL

## 2024-01-01 MED ORDER — ONDANSETRON HCL 4 MG/2ML IJ SOLN: 4.0000 mg | Freq: Four times a day (QID) | INTRAMUSCULAR | Status: DC | PRN

## 2024-01-01 MED ORDER — EMPAGLIFLOZIN 10 MG PO TABS
10.0000 mg | ORAL_TABLET | Freq: Every day | ORAL | Status: DC
  Administered 2024-01-01 – 2024-01-02 (×2): 10 mg via ORAL
  Filled 2024-01-01 (×2): qty 1

## 2024-01-01 MED ORDER — POTASSIUM CHLORIDE CRYS ER 20 MEQ PO TBCR
40.0000 meq | EXTENDED_RELEASE_TABLET | Freq: Once | ORAL | Status: AC
  Administered 2024-01-01: 40 meq via ORAL
  Filled 2024-01-01: qty 2

## 2024-01-01 MED ORDER — METHOTREXATE SODIUM 2.5 MG PO TABS
20.0000 mg | ORAL_TABLET | ORAL | Status: DC
  Filled 2024-01-01: qty 8

## 2024-01-01 MED ORDER — METOPROLOL TARTRATE 25 MG PO TABS
25.0000 mg | ORAL_TABLET | Freq: Two times a day (BID) | ORAL | Status: DC
  Administered 2024-01-01 – 2024-01-02 (×2): 25 mg via ORAL
  Filled 2024-01-01 (×3): qty 1

## 2024-01-01 MED ORDER — SUCRALFATE 1 G PO TABS
1.0000 g | ORAL_TABLET | Freq: Two times a day (BID) | ORAL | Status: DC
  Administered 2024-01-01 – 2024-01-02 (×3): 1 g via ORAL
  Filled 2024-01-01 (×3): qty 1

## 2024-01-01 MED ORDER — ATORVASTATIN CALCIUM 10 MG PO TABS
10.0000 mg | ORAL_TABLET | Freq: Every day | ORAL | Status: DC
  Administered 2024-01-01: 10 mg via ORAL
  Filled 2024-01-01: qty 1

## 2024-01-01 MED ORDER — FUROSEMIDE 10 MG/ML IJ SOLN
40.0000 mg | Freq: Once | INTRAMUSCULAR | Status: AC
  Administered 2024-01-01: 40 mg via INTRAVENOUS
  Filled 2024-01-01: qty 4

## 2024-01-01 MED ORDER — CYCLOSPORINE 0.05 % OP EMUL
1.0000 [drp] | Freq: Two times a day (BID) | OPHTHALMIC | Status: DC
  Administered 2024-01-01 – 2024-01-02 (×3): 1 [drp] via OPHTHALMIC
  Filled 2024-01-01 (×3): qty 30

## 2024-01-01 MED ORDER — NITROGLYCERIN 0.4 MG SL SUBL: 0.4000 mg | SUBLINGUAL_TABLET | SUBLINGUAL | Status: DC | PRN

## 2024-01-01 MED ORDER — FUROSEMIDE 10 MG/ML IJ SOLN
60.0000 mg | Freq: Two times a day (BID) | INTRAMUSCULAR | Status: DC
  Administered 2024-01-01 – 2024-01-02 (×2): 60 mg via INTRAVENOUS
  Filled 2024-01-01 (×2): qty 6

## 2024-01-01 MED ORDER — EZETIMIBE 10 MG PO TABS
10.0000 mg | ORAL_TABLET | Freq: Every day | ORAL | Status: DC
  Administered 2024-01-01 – 2024-01-02 (×2): 10 mg via ORAL
  Filled 2024-01-01 (×2): qty 1

## 2024-01-01 MED ORDER — FERROUS SULFATE 325 (65 FE) MG PO TABS
325.0000 mg | ORAL_TABLET | Freq: Two times a day (BID) | ORAL | Status: DC
  Administered 2024-01-01 – 2024-01-02 (×3): 325 mg via ORAL
  Filled 2024-01-01 (×3): qty 1

## 2024-01-01 MED ORDER — POTASSIUM CHLORIDE 10 MEQ/100ML IV SOLN
10.0000 meq | INTRAVENOUS | Status: AC
  Administered 2024-01-01 (×2): 10 meq via INTRAVENOUS
  Filled 2024-01-01 (×2): qty 100

## 2024-01-01 MED ORDER — SPIRONOLACTONE 12.5 MG HALF TABLET
12.5000 mg | ORAL_TABLET | Freq: Every day | ORAL | Status: DC
  Administered 2024-01-01 – 2024-01-02 (×2): 12.5 mg via ORAL
  Filled 2024-01-01 (×2): qty 1

## 2024-01-01 MED ORDER — APIXABAN 5 MG PO TABS
5.0000 mg | ORAL_TABLET | Freq: Two times a day (BID) | ORAL | Status: DC
  Administered 2024-01-01 – 2024-01-02 (×3): 5 mg via ORAL
  Filled 2024-01-01 (×3): qty 1

## 2024-01-01 MED ORDER — ACETAMINOPHEN 650 MG RE SUPP: 650.0000 mg | Freq: Four times a day (QID) | RECTAL | Status: DC | PRN

## 2024-01-01 MED ORDER — ACETAMINOPHEN 325 MG PO TABS: 650.0000 mg | ORAL_TABLET | Freq: Four times a day (QID) | ORAL | Status: DC | PRN

## 2024-01-01 MED ORDER — FLUTICASONE PROPIONATE 50 MCG/ACT NA SUSP: 1.0000 | Freq: Every day | NASAL | Status: DC | PRN

## 2024-01-01 MED ORDER — FOLIC ACID 1 MG PO TABS
1.0000 mg | ORAL_TABLET | Freq: Every day | ORAL | Status: DC
  Administered 2024-01-01 – 2024-01-02 (×2): 1 mg via ORAL
  Filled 2024-01-01 (×2): qty 1

## 2024-01-01 MED ORDER — LORATADINE 10 MG PO TABS
10.0000 mg | ORAL_TABLET | Freq: Every day | ORAL | Status: DC
  Administered 2024-01-01 – 2024-01-02 (×2): 10 mg via ORAL
  Filled 2024-01-01 (×2): qty 1

## 2024-01-01 MED ORDER — PANTOPRAZOLE SODIUM 40 MG PO TBEC
40.0000 mg | DELAYED_RELEASE_TABLET | Freq: Every day | ORAL | Status: DC
  Administered 2024-01-01 – 2024-01-02 (×2): 40 mg via ORAL
  Filled 2024-01-01 (×2): qty 1

## 2024-01-01 MED ORDER — ENOXAPARIN SODIUM 40 MG/0.4ML IJ SOSY: 40.0000 mg | PREFILLED_SYRINGE | INTRAMUSCULAR | Status: DC

## 2024-01-01 MED ORDER — ONDANSETRON HCL 4 MG PO TABS: 4.0000 mg | ORAL_TABLET | Freq: Four times a day (QID) | ORAL | Status: DC | PRN

## 2024-01-01 NOTE — H&P (Addendum)
 History and Physical    PatientAhlana [name] FMW:[id number] DOB: Jun 26, 1941 DOA: 01/01/2024 DOS: the patient was seen and examined on 01/01/2024 PCP: Benjamin Raina Elizabeth, NP  Patient coming from: Home  Chief Complaint:  Chief Complaint  Patient presents with   Chest Pain   HPI: Dana [name] is a 83 y.o. female with medical history significant of coronary artery disease, hypertension, hyperlipidemia, rheumatoid arthritis, and hypertrophic cardiomyopathy who presented with acute onset of dyspnea and chest pain.  Limited history due to language barrier, her son at the bedside was able to translate.  Apparently patient has been doing well until late evening on the day of admission. She developed rapid worsening dyspnea, with increased work of breathing and respiratory distress. No improving factors, worse with exertion and associated with chest pain.   Because severity of symptoms she was brought to the hospital by her son.   Last hospitalization 01/29 to 07/27/23 for heart failure exacerbation, discharged with instructions to take furosemide  20 mg as needed for volume overload. Apparently did not follow up as outpatient.    Review of Systems: unable to review all systems due to the inability of the patient to answer questions. Past Medical History:  Diagnosis Date   CAD (coronary artery disease)    a. s/p DES to OM1 in 02/2017 b. patent stent by cath in 11/2022   Chest pain    Essential hypertension    Gout    HOCM (hypertrophic obstructive cardiomyopathy) (HCC)    Hyperlipidemia    Rheumatoid arthritis (HCC)    Past Surgical History:  Procedure Laterality Date   BIOPSY  10/20/2022   Procedure: BIOPSY;  Surgeon: Eda Iha, MD;  Location: [facility] ENDOSCOPY;  Service: Gastroenterology;;   CHOLECYSTECTOMY N/A 08/17/2018   Procedure: LAPAROSCOPIC CHOLECYSTECTOMY;  Surgeon: Mavis Anes, MD;  Location: [facility] ORS;  Service: General;  Laterality: N/A;   COLONOSCOPY WITH PROPOFOL  N/A  10/20/2022   Procedure: COLONOSCOPY WITH PROPOFOL ;  Surgeon: Eda Iha, MD;  Location: [facility] ENDOSCOPY;  Service: Gastroenterology;  Laterality: N/A;   CORONARY BALLOON ANGIOPLASTY N/A 03/03/2017   Procedure: CORONARY BALLOON ANGIOPLASTY;  Surgeon: Anner Alm ORN, MD;  Location: [facility] INVASIVE CV LAB;  Service: Cardiovascular;  Laterality: N/A;   CORONARY STENT INTERVENTION N/A 03/03/2017   Procedure: CORONARY STENT INTERVENTION;  Surgeon: Anner Alm ORN, MD;  Location: [facility] INVASIVE CV LAB;  Service: Cardiovascular;  Laterality: N/A;   ESOPHAGOGASTRODUODENOSCOPY (EGD) WITH PROPOFOL  N/A 10/20/2022   Procedure: ESOPHAGOGASTRODUODENOSCOPY (EGD) WITH PROPOFOL ;  Surgeon: Eda Iha, MD;  Location: [facility] ENDOSCOPY;  Service: Gastroenterology;  Laterality: N/A;   LEFT HEART CATH AND CORONARY ANGIOGRAPHY N/A 03/02/2017   Procedure: LEFT HEART CATH AND CORONARY ANGIOGRAPHY;  Surgeon: Wonda Sharper, MD;  Location: [facility] INVASIVE CV LAB;  Service: Cardiovascular;  Laterality: N/A;   LEFT HEART CATH AND CORONARY ANGIOGRAPHY N/A 12/08/2022   Procedure: LEFT HEART CATH AND CORONARY ANGIOGRAPHY;  Surgeon: Wendel Lurena POUR, MD;  Location: [facility] INVASIVE CV LAB;  Service: Cardiovascular;  Laterality: N/A;   POLYPECTOMY  10/20/2022   Procedure: POLYPECTOMY;  Surgeon: Eda Iha, MD;  Location: [facility] ENDOSCOPY;  Service: Gastroenterology;;   Social History:  reports that she has never smoked. She has never used smokeless tobacco. She reports that she does not drink alcohol and does not use drugs.  No Known Allergies  Family History  Problem Relation Age of Onset   Hypertension Mother    Liver disease Mother    Alcohol abuse Father     Prior to  Admission medications   Medication Sig Start Date End Date Taking? Authorizing Provider  acetaminophen  (TYLENOL ) 325 MG tablet Take 2 tablets (650 mg total) by mouth every 6 (six) hours as needed for mild pain, fever or headache (or Fever >/= 101). 08/07/19   Pearlean Manus, MD  alendronate (FOSAMAX) 70 MG tablet Take 70 mg by mouth every Wednesday. 03/15/19   [provider]  allopurinol  (ZYLOPRIM ) 300 MG tablet Take 300 mg by mouth daily.    [provider]  atorvastatin  (LIPITOR) 10 MG tablet Take 10 mg by mouth at bedtime.    [provider]  Cholecalciferol  (VITAMIN D-3 PO) Take 1 tablet by mouth daily.    [provider]  ezetimibe  (ZETIA ) 10 MG tablet Take 10 mg by mouth daily.    [provider]  ferrous sulfate  325 (65 FE) MG tablet Take 325 mg by mouth 2 (two) times daily.    [provider]  fluticasone  (FLONASE ) 50 MCG/ACT nasal spray Place 1 spray into both nostrils daily as needed for allergies. 06/08/23   [provider]  folic acid  (FOLVITE ) 1 MG tablet Take 1 mg by mouth daily. 07/26/19   [provider]  furosemide  (LASIX ) 20 MG tablet Take 20 mg by mouth daily as needed for fluid.    [provider]  loratadine  (CLARITIN ) 10 MG tablet Take 10 mg by mouth daily.    [provider]  methotrexate  2.5 MG tablet Take 20 mg by mouth every Wednesday.     [provider]  nitroGLYCERIN  (NITROSTAT ) 0.4 MG SL tablet Place 1 tablet (0.4 mg total) under the tongue every 5 (five) minutes as needed for chest pain. 07/27/23   Vicci Afton CROME, MD  pantoprazole  (PROTONIX ) 40 MG tablet Take 1 tablet (40 mg total) by mouth daily before breakfast. 07/27/23   Johnson, Clanford L, MD  RESTASIS  0.05 % ophthalmic emulsion Place 1 drop into both eyes 2 (two) times daily. 11/10/22   [provider]  sucralfate  (CARAFATE ) 1 g tablet Take 1 g by mouth 2 (two) times daily. 08/01/19   [provider]  verapamil  (VERELAN ) 360 MG 24 hr capsule TAKE (1) CAPSULE BY MOUTH AT BEDTIME. 06/30/23   Santo Stanly LABOR, MD    Physical Exam: Vitals:   01/01/24 0130 01/01/24 0145 01/01/24 0245 01/01/24 0300  BP: 94/69 101/72 116/64 96/78  Pulse: 70 73 71 66   Resp: 20 20  20   Temp:      TempSrc:      SpO2: 94% 95% 93% 94%  Weight:      Height:       BP 96/78   Pulse 66   Temp 98 F (36.7 C) (Oral)   Resp 20   Ht 5' 2 (1.575 m)   Wt 76.9 kg   SpO2 94%   BMI 31.01 kg/m   Neurology awake and alert ENT with mild pallor Cardiovascular with S1 and S2 present, irregular with no gallops or rubs, positive systolic murmur at the apex Mild JVD Respiratory with rhonchi and rales bilaterally with no wheezing Abdomen with no distention  Lower extremity edema + bilaterally  Data Reviewed:   Na 142, K 3,4 CL 107 bicarbonate 22 glucose 104 bun 18 cr 0,79  AST 25 ALT 13  BNP 595  High sensitive troponin 255 and 231  Wbc 6,7 hgb 13,2 plt 166   Chest radiograph with right rotation, positive cardiomegaly, bilateral hilar vascular congestion with   EKG  76 bpm, normal axis, qtc 500, left bundle branch block, atrial fibrillation rhythm with q wave V1 to V3, no significant ST segment or T wave changes.   Assessment and Plan: * Acute on chronic diastolic CHF (congestive heart failure) (HCC) Echocardiogram with preserved LV systolic function with EF 70 to 75%, severe basal septal hypertrophy, SAM of the anterior MV leaflet, RV systolic function preserved, LA with severe dilatation, mild to moderate mitral valve regurgitation, mild aortic valve stenosis.  Plan to continue diuresis with furosemide  60 mg IV bid  Will change verapamil  for metoprolol   Add SGLT 2 inh to augment diuresis and spironolactone  for mineralocorticoid receptor blocker.  If blood pressure stable will add ARB.  Check new echocardiogram.   Acute hypoxemic respiratory failure due to acute cardiogenic pulmonary edema On admission 02 saturation on room air 88% with increased work of breathing Currently on 2 L/min per min per [place] Continue oxymetry monitoring and supplemental 02 per [place] to keep 02 saturation 92% or greater. Continue diuresis.   Essential hypertension Continue  blood pressure monitoring.  If stable blood pressure add ARB   New onset atrial fibrillation (HCC) Will add metoprolol  for rate control.  Discontinue verapamil .  Cha 2ds 2 vasc score is 5, will start patient on apixaban  for anticoagulation. Continue telemetry monitoring   Coronary artery disease Elevated high sensitive troponin likely due to heart failure exacerbation and atrial fibrillation  Will follow up echocardiogram to rule out regional wall motion abnormalities.    Mixed hyperlipidemia Continue atorvastatin  and ezetimibe .   Rheumatoid arthritis (HCC) Continue with methotrexate  every week (Wednesday)   Gout Continue with allopurinol    GERD without esophagitis Continue pantoprazole  and sucralfate        Advance Care Planning:   Code Status: Full Code   Consults: none   Family Communication: I spoke with patient's son at the bedside, we talked in detail about patient's condition, plan of care and prognosis and all questions were addressed.   Severity of Illness: The appropriate patient status for this patient is INPATIENT. Inpatient status is judged to be reasonable and necessary in order to provide the required intensity of service to ensure the patient's safety. The patient's presenting symptoms, physical exam findings, and initial radiographic and laboratory data in the context of their chronic comorbidities is felt to place them at high risk for further clinical deterioration. Furthermore, it is not anticipated that the patient will be medically stable for discharge from the hospital within 2 midnights of admission.   * I certify that at the point of admission it is my clinical judgment that the patient will require inpatient hospital care spanning beyond 2 midnights from the point of admission due to high intensity of service, high risk for further deterioration and high frequency of surveillance required.*  Author: Elidia Toribio Furnace, MD 01/01/2024 4:00 AM  For  on call review [email or web address].

## 2024-01-01 NOTE — Assessment & Plan Note (Addendum)
 Continue pantoprazole and sucralfate.

## 2024-01-01 NOTE — Assessment & Plan Note (Signed)
 Will add metoprolol  for rate control.  Discontinue verapamil .  Cha 2ds 2 vasc score is 5, will start patient on apixaban  for anticoagulation. Continue telemetry monitoring

## 2024-01-01 NOTE — ED Triage Notes (Signed)
 Pt bib from home with family reporting chest pain and sob that started this evening. Pt with hx of CHF.

## 2024-01-01 NOTE — Telephone Encounter (Signed)
 Patient Product/process development scientist completed.    The patient is insured through [employer]. Patient has Medicare and is not eligible for a copay card, but may be able to apply for patient assistance or Medicare RX Payment Plan (Patient Must reach out to their plan, if eligible for payment plan), if available.    Ran test claim for Eliquis 5 mg and the current 30 day co-pay is $4.80.   This test claim was processed through [facility]- copay amounts may vary at other pharmacies due to pharmacy/plan contracts, or as the patient moves through the different stages of their insurance plan.     Morgan Arab, CPHT Pharmacy Technician III Certified Patient Advocate [facility] Patient Advocate Team Direct Number: [phone]  Fax: [phone]

## 2024-01-01 NOTE — Assessment & Plan Note (Signed)
 Continue with methotrexate  every week (Wednesday)

## 2024-01-01 NOTE — Discharge Instructions (Signed)

## 2024-01-01 NOTE — ED Notes (Signed)
Pt ambulatory to the bathroom independently.

## 2024-01-01 NOTE — Plan of Care (Signed)

## 2024-01-01 NOTE — ED Notes (Signed)
 Pt is sitting in a tripod position during triage.

## 2024-01-01 NOTE — Progress Notes (Signed)
 Connected Dr. Maree to interpretive services and specifically to the options available since patient speaks Samoan and it is a low diffuse population in the area. Telephone interpretation available. Patient daughter speaks [place]oan and [place]. She is also an EMT and is able to interpret medical language.  Ether Blush, M.Div Chaplain

## 2024-01-01 NOTE — Progress Notes (Signed)
   01/01/24 1341  TOC Brief Assessment  Insurance and Status Reviewed  Patient has primary care physician Yes  Home environment has been reviewed HOME with [place]  Prior level of function: independent  Prior/Current Home Services No current home services  Social Drivers of Health Review SDOH reviewed no interventions necessary  Readmission risk has been reviewed Yes  Transition of care needs no transition of care needs at this time   Chaplain assisting with finding Nurse, learning disability.  Transition of Care Department [facility]) has reviewed patient and no TOC needs have been identified at this time. We will continue to monitor patient advancement through interdisciplinary progression rounds. If new patient transition needs arise, please place a TOC consult.

## 2024-01-01 NOTE — Assessment & Plan Note (Signed)
-

## 2024-01-01 NOTE — Assessment & Plan Note (Signed)
 Continue with allopurinol

## 2024-01-01 NOTE — Progress Notes (Signed)
 PROGRESS NOTE    Dana [name]  FMW:[id number] DOB: 10-15-1940 DOA: 01/01/2024 PCP: Benjamin Raina Elizabeth, NP   Brief Narrative:    Dana [name] is a 83 y.o. female with medical history significant of coronary artery disease, hypertension, hyperlipidemia, rheumatoid arthritis, and hypertrophic cardiomyopathy who presented with acute onset of dyspnea and chest pain.  She has been admitted for acute hypoxemic respiratory failure secondary to acute on chronic diastolic CHF exacerbation in the setting of new onset atrial fibrillation.  She has been started on IV diuresis with 2D echocardiogram pending and is now on Eliquis  for anticoagulation.  Assessment & Plan:   Principal Problem:   Acute on chronic diastolic CHF (congestive heart failure) (HCC) Active Problems:   Essential hypertension   New onset atrial fibrillation (HCC)   Coronary artery disease   Mixed hyperlipidemia   Rheumatoid arthritis (HCC)   Gout   GERD without esophagitis   Obesity, class 1  Assessment and Plan:  Acute on chronic diastolic CHF (congestive heart failure) (HCC) Echocardiogram with preserved LV systolic function with EF 70 to 75%, severe basal septal hypertrophy, SAM of the anterior MV leaflet, RV systolic function preserved, LA with severe dilatation, mild to moderate mitral valve regurgitation, mild aortic valve stenosis.  Plan to continue diuresis with furosemide  60 mg IV bid  Will change verapamil  for metoprolol   Add SGLT 2 inh to augment diuresis and spironolactone  for mineralocorticoid receptor blocker.  If blood pressure stable will add ARB.  Check new echocardiogram.   Mild hypokalemia Replete and reevaluate  Acute hypoxemic respiratory failure due to acute cardiogenic pulmonary edema On admission 02 saturation on room air 88% with increased work of breathing Currently on 2 L/min per min per [place] Continue oxymetry monitoring and supplemental 02 per [place] to keep 02 saturation 92% or  greater. Continue diuresis.   Essential hypertension Continue blood pressure monitoring.  If stable blood pressure add ARB   New onset atrial fibrillation (HCC) Will add metoprolol  for rate control.  Discontinue verapamil .  Cha 2ds 2 vasc score is 5, will start patient on apixaban  for anticoagulation. Continue telemetry monitoring   Coronary artery disease Elevated high sensitive troponin likely due to heart failure exacerbation and atrial fibrillation  Will follow up echocardiogram to rule out regional wall motion abnormalities.    Mixed hyperlipidemia Continue atorvastatin  and ezetimibe .   Rheumatoid arthritis (HCC) Continue with methotrexate  every week (Wednesday)   Gout Continue with allopurinol    GERD without esophagitis Continue pantoprazole  and sucralfate    Obesity, class I BMI 30.31    DVT prophylaxis: Apixaban  Code Status: Full Family Communication: Discussed with daughter at bedside 7/7 Disposition Plan:  Status is: Inpatient Remains inpatient appropriate because: Need for IV medications.  Consultants:  None  Procedures:  None  Antimicrobials:  None   Subjective: Patient seen and evaluated today with no new acute complaints or concerns. No acute concerns or events noted overnight.  She is currently receiving 2D echocardiogram and daughter is at bedside.  Objective: Vitals:   01/01/24 0340 01/01/24 0400 01/01/24 0518 01/01/24 0700  BP: 115/85 99/82 (!) 125/55   Pulse: 86 69 77   Resp:  19 (!) 37 20  Temp:   97.6 F (36.4 C)   TempSrc:   Axillary   SpO2: 90% 94% 95%   Weight:   80.1 kg   Height:   5' 4 (1.626 m)     Intake/Output Summary (Last 24 hours) at 01/01/2024 0741 Last data filed at 01/01/2024 9346 Gross  per 24 hour  Intake 353.33 ml  Output 300 ml  Net 53.33 ml   Filed Weights   01/01/24 0049 01/01/24 0518  Weight: 76.9 kg 80.1 kg    Examination:  General exam: Appears calm and comfortable  Respiratory system: Clear to  auscultation. Respiratory effort normal.  Currently on nasal cannula. Cardiovascular system: S1 & S2 heard, RRR.  Gastrointestinal system: Abdomen is soft Central nervous system: Alert and awake Extremities: No edema Skin: No significant lesions noted Psychiatry: Flat affect.    Data Reviewed: I have personally reviewed following labs and imaging studies  CBC: Recent Labs  Lab 01/01/24 0119  WBC 6.7  NEUTROABS 4.7  HGB 13.2  HCT 38.6  MCV 100.8*  PLT 166   Basic Metabolic Panel: Recent Labs  Lab 01/01/24 0119  NA 142  K 3.4*  CL 107  CO2 22  GLUCOSE 104*  BUN 18  CREATININE 0.79  CALCIUM  8.5*   GFR: Estimated Creatinine Clearance: 55.5 mL/min (by C-G formula based on SCr of 0.79 mg/dL). Liver Function Tests: Recent Labs  Lab 01/01/24 0119  AST 25  ALT 13  ALKPHOS 63  BILITOT 0.8  PROT 7.0  ALBUMIN 3.4*   No results for input(s): LIPASE, AMYLASE in the last 168 hours. No results for input(s): AMMONIA in the last 168 hours. Coagulation Profile: No results for input(s): INR, PROTIME in the last 168 hours. Cardiac Enzymes: No results for input(s): CKTOTAL, CKMB, CKMBINDEX, TROPONINI in the last 168 hours. BNP (last 3 results) No results for input(s): PROBNP in the last 8760 hours. HbA1C: No results for input(s): HGBA1C in the last 72 hours. CBG: No results for input(s): GLUCAP in the last 168 hours. Lipid Profile: No results for input(s): CHOL, HDL, LDLCALC, TRIG, CHOLHDL, LDLDIRECT in the last 72 hours. Thyroid  Function Tests: No results for input(s): TSH, T4TOTAL, FREET4, T3FREE, THYROIDAB in the last 72 hours. Anemia Panel: No results for input(s): VITAMINB12, FOLATE, FERRITIN, TIBC, IRON, RETICCTPCT in the last 72 hours. Sepsis Labs: No results for input(s): PROCALCITON, LATICACIDVEN in the last 168 hours.  No results found for this or any previous visit (from the past 240 hours).        Radiology Studies: DG Chest Portable 1 View Result Date: 01/01/2024 CLINICAL DATA:  Shortness of breath starting this evening. Chest pain. History of CHF. EXAM: PORTABLE CHEST 1 VIEW COMPARISON:  07/26/2023 FINDINGS: Cardiac enlargement. Perihilar infiltration is progressing since prior study, likely edema but could indicate multifocal pneumonia in the appropriate clinical setting. No pleural effusion or pneumothorax. Mediastinal contours appear intact. Degenerative changes in the spine and shoulders. IMPRESSION: Cardiac enlargement with perihilar infiltrates, likely edema. Electronically Signed   By: Elsie Gravely M.D.   On: 01/01/2024 02:03        Scheduled Meds:  allopurinol   300 mg Oral Daily   apixaban   5 mg Oral BID   atorvastatin   10 mg Oral QHS   Chlorhexidine  Gluconate Cloth  6 each Topical Daily   cycloSPORINE   1 drop Both Eyes BID   empagliflozin   10 mg Oral Daily   ezetimibe   10 mg Oral Daily   ferrous sulfate   325 mg Oral BID WC   folic acid   1 mg Oral Daily   furosemide   60 mg Intravenous Q12H   loratadine   10 mg Oral Daily   [START ON 01/03/2024] methotrexate   20 mg Oral Q Wed   metoprolol  tartrate  25 mg Oral BID   pantoprazole   40 mg  Oral QAC breakfast   spironolactone   12.5 mg Oral Daily   sucralfate   1 g Oral BID AC     LOS: 0 days    Time spent: 55 minutes    Nicoli Nardozzi JONETTA Fairly, DO [facility]  If 7PM-7AM, please contact night-coverage [email or web address] 01/01/2024, 7:41 AM

## 2024-01-01 NOTE — Progress Notes (Addendum)
 PHARMACY - ANTICOAGULATION CONSULT NOTE  Pharmacy Consult for apixaban  Indication: atrial fibrillation  No Known Allergies  Patient Measurements: Height: 5' 2 (157.5 cm) Weight: 76.9 kg (169 lb 8.5 oz) IBW/kg (Calculated) : 50.1 HEPARIN  DW (KG): 66.9  Vital Signs: Temp: 98 F (36.7 C) (07/07 0049) Temp Source: Oral (07/07 0049) BP: 99/82 (07/07 0400) Pulse Rate: 69 (07/07 0400)  Labs: Recent Labs    01/01/24 0119 01/01/24 0256  HGB 13.2  --   HCT 38.6  --   PLT 166  --   CREATININE 0.79  --   TROPONINIHS 255* 231*    Estimated Creatinine Clearance: 52 mL/min (by C-G formula based on SCr of 0.79 mg/dL).   Medical History: Past Medical History:  Diagnosis Date   CAD (coronary artery disease)    a. s/p DES to OM1 in 02/2017 b. patent stent by cath in 11/2022   Chest pain    Essential hypertension    Gout    HOCM (hypertrophic obstructive cardiomyopathy) (HCC)    Hyperlipidemia    Rheumatoid arthritis (HCC)    Assessment: 33 YoF presented to ED with acute onset of dyspnea and chest pain anjd found to be in afib. Start eliquis  for afib.  -CBC stable -Age 83, sCr <1.5, Wt > 60kg: 5mg  PO BID dosing -CHADS-VASc score 5-6  Goal of Therapy:  Monitor platelets by anticoagulation protocol: Yes   Plan:  -Start apixaban  5mg  PO BID -Follow up education and copay costs prior to discharge   Lynwood Poplar, PharmD, BCPS Clinical Pharmacist 01/01/2024 4:41 AM

## 2024-01-01 NOTE — Assessment & Plan Note (Signed)
 Continue blood pressure monitoring.  If stable blood pressure add ARB

## 2024-01-01 NOTE — ED Provider Notes (Signed)
 [place] EMERGENCY DEPARTMENT AT [facility] Provider Note   CSN: [id number] Arrival date & time: 01/01/24  0036     Patient presents with: Chest Pain   Dana [name] is a 83 y.o. female.   83 year old female who speaks Samoan and prefers her sons to translate for her presents ER today with shortness of breath and associated chest pain.  I saw that she has had lower extremity edema for the last few days but then over the last few hours prior to arrival she had progressively worsening shortness of breath.  States some similar happened previously when she had extra fluid.  She has been compliant with her medications.  She is not sure about compliance with her diet.   Chest Pain      Prior to Admission medications   Medication Sig Start Date End Date Taking? Authorizing Provider  acetaminophen  (TYLENOL ) 325 MG tablet Take 2 tablets (650 mg total) by mouth every 6 (six) hours as needed for mild pain, fever or headache (or Fever >/= 101). 08/07/19   Pearlean Manus, MD  alendronate (FOSAMAX) 70 MG tablet Take 70 mg by mouth every Wednesday. 03/15/19   [provider]  allopurinol  (ZYLOPRIM ) 300 MG tablet Take 300 mg by mouth daily.    [provider]  atorvastatin  (LIPITOR) 10 MG tablet Take 10 mg by mouth at bedtime.    [provider]  Cholecalciferol  (VITAMIN D-3 PO) Take 1 tablet by mouth daily.    [provider]  ezetimibe  (ZETIA ) 10 MG tablet Take 10 mg by mouth daily.    [provider]  ferrous sulfate  325 (65 FE) MG tablet Take 325 mg by mouth 2 (two) times daily.    [provider]  fluticasone  (FLONASE ) 50 MCG/ACT nasal spray Place 1 spray into both nostrils daily as needed for allergies. 06/08/23   [provider]  folic acid  (FOLVITE ) 1 MG tablet Take 1 mg by mouth daily. 07/26/19   [provider]  furosemide  (LASIX ) 20 MG tablet Take 20 mg by mouth daily as needed for fluid.    [provider]  loratadine  (CLARITIN ) 10 MG tablet Take 10 mg by mouth daily.    [provider]  methotrexate  2.5 MG tablet Take 20 mg by mouth every Wednesday.     [provider]  nitroGLYCERIN  (NITROSTAT ) 0.4 MG SL tablet Place 1 tablet (0.4 mg total) under the tongue every 5 (five) minutes as needed for chest pain. 07/27/23   Vicci Afton CROME, MD  pantoprazole  (PROTONIX ) 40 MG tablet Take 1 tablet (40 mg total) by mouth daily before breakfast. 07/27/23   Johnson, Clanford L, MD  RESTASIS  0.05 % ophthalmic emulsion Place 1 drop into both eyes 2 (two) times daily. 11/10/22   [provider]  sucralfate  (CARAFATE ) 1 g tablet Take 1 g by mouth 2 (two) times daily. 08/01/19   [provider]  verapamil  (VERELAN ) 360 MG 24 hr capsule TAKE (1) CAPSULE BY MOUTH AT BEDTIME. 06/30/23   Santo Stanly LABOR, MD    Allergies: Patient has no known allergies.    Review of Systems  Cardiovascular:  Positive for chest pain.    Updated Vital Signs BP 96/78   Pulse 66   Temp 98 F (36.7 C) (Oral)   Resp 20   Ht 5' 2 (1.575 m)   Wt 76.9 kg   SpO2 94%   BMI 31.01 kg/m   Physical Exam Vitals and nursing note reviewed.  Constitutional:      Appearance: She is well-developed.  HENT:     Head: Normocephalic and atraumatic.  Cardiovascular:     Rate and Rhythm: Normal rate and regular rhythm.  Pulmonary:     Effort: Tachypnea present. No respiratory distress.     Breath sounds: No stridor. Examination of the right-upper field reveals rales. Examination of the right-middle field reveals rales. Examination of the right-lower field reveals rales. Wheezing and rales present.  Abdominal:     General: There is no distension.  Musculoskeletal:     Cervical back: Normal range of motion.  Neurological:     Mental Status: She is alert.     (all labs ordered are listed, but only abnormal results are displayed) Labs Reviewed  CBC WITH DIFFERENTIAL/PLATELET -  Abnormal; Notable for the following components:      Result Value   RBC 3.83 (*)    MCV 100.8 (*)    MCH 34.5 (*)    All other components within normal limits  COMPREHENSIVE METABOLIC PANEL WITH GFR - Abnormal; Notable for the following components:   Potassium 3.4 (*)    Glucose, Bld 104 (*)    Calcium  8.5 (*)    Albumin 3.4 (*)    All other components within normal limits  BRAIN NATRIURETIC PEPTIDE - Abnormal; Notable for the following components:   B Natriuretic Peptide 595.0 (*)    All other components within normal limits  TROPONIN I (HIGH SENSITIVITY) - Abnormal; Notable for the following components:   Troponin I (High Sensitivity) 255 (*)    All other components within normal limits  TROPONIN I (HIGH SENSITIVITY) - Abnormal; Notable for the following components:   Troponin I (High Sensitivity) 231 (*)    All other components within normal limits    EKG: None  Radiology: DG Chest Portable 1 View Result Date: 01/01/2024 CLINICAL DATA:  Shortness of breath starting this evening. Chest pain. History of CHF. EXAM: PORTABLE CHEST 1 VIEW COMPARISON:  07/26/2023 FINDINGS: Cardiac enlargement. Perihilar infiltration is progressing since prior study, likely edema but could indicate multifocal pneumonia in the appropriate clinical setting. No pleural effusion or pneumothorax. Mediastinal contours appear intact. Degenerative changes in the spine and shoulders. IMPRESSION: Cardiac enlargement with perihilar infiltrates, likely edema. Electronically Signed   By: Elsie Gravely M.D.   On: 01/01/2024 02:03     .Critical Care  Performed by: Lorette Mayo, MD Authorized by: Lorette Mayo, MD   Critical care provider statement:    Critical care time (minutes):  30   Critical care was necessary to treat or prevent imminent or life-threatening deterioration of the following conditions:  Respiratory failure   Critical care was time spent personally by me on the following activities:   Development of treatment plan with patient or surrogate, discussions with consultants, evaluation of patient's response to treatment, examination of patient, ordering and review of laboratory studies, ordering and review of radiographic studies, ordering and performing treatments and interventions, pulse oximetry, re-evaluation of patient's condition and review of old charts    Medications Ordered in the ED  furosemide  (LASIX ) injection 40 mg (40 mg Intravenous Given 01/01/24 0247)  potassium chloride  10 mEq in 100 mL IVPB (10 mEq Intravenous New Bag/Given 01/01/24 0249)                                    Medical Decision Making Amount and/or Complexity of Data  Reviewed Labs: ordered. Radiology: ordered.  Risk Prescription drug management. Decision regarding hospitalization.   Patient with asymmetric Rales, tachypnea, hypoxia lower extreme anemia.  Labs and chest x-ray consistent with pulmonary edema likely related to decompensated heart failure.  Troponin a bit elevated but slightly downtrending I suspect this is more type II that is ACS.  EKG does not show any ischemia.  Started diuresis here patient started urinating but still tachypneic and still requiring oxygen  to maintain saturations above 90%.  Discussed with hospitalist for admission.     Final diagnoses:  Acute pulmonary edema (HCC)  Acute respiratory failure with hypoxia [facility])    ED Discharge Orders     None          Mry Lamia, Selinda, MD 01/01/24 [phone]

## 2024-01-01 NOTE — Assessment & Plan Note (Addendum)
 Echocardiogram with preserved LV systolic function with EF 70 to 75%, severe basal septal hypertrophy, SAM of the anterior MV leaflet, RV systolic function preserved, LA with severe dilatation, mild to moderate mitral valve regurgitation, mild aortic valve stenosis.  Plan to continue diuresis with furosemide  60 mg IV bid  Will change verapamil  for metoprolol   Add SGLT 2 inh to augment diuresis and spironolactone  for mineralocorticoid receptor blocker.  If blood pressure stable will add ARB.  Check new echocardiogram.   Acute hypoxemic respiratory failure due to acute cardiogenic pulmonary edema On admission 02 saturation on room air 88% with increased work of breathing Currently on 2 L/min per min per [place] Continue oxymetry monitoring and supplemental 02 per [place] to keep 02 saturation 92% or greater. Continue diuresis.

## 2024-01-01 NOTE — Assessment & Plan Note (Signed)
 Elevated high sensitive troponin likely due to heart failure exacerbation and atrial fibrillation  Will follow up echocardiogram to rule out regional wall motion abnormalities.

## 2024-01-02 DIAGNOSIS — I459 Conduction disorder, unspecified: Secondary | ICD-10-CM | POA: Diagnosis present

## 2024-01-02 DIAGNOSIS — I5033 Acute on chronic diastolic (congestive) heart failure: Secondary | ICD-10-CM | POA: Diagnosis not present

## 2024-01-02 LAB — BASIC METABOLIC PANEL WITH GFR
Anion gap: 12 (ref 5–15)
BUN: 18 mg/dL (ref 8–23)
CO2: 26 mmol/L (ref 22–32)
Calcium: 8.9 mg/dL (ref 8.9–10.3)
Chloride: 101 mmol/L (ref 98–111)
Creatinine, Ser: 0.96 mg/dL (ref 0.44–1.00)
GFR, Estimated: 59 mL/min — ABNORMAL LOW (ref >60)
Glucose, Bld: 100 mg/dL — ABNORMAL HIGH (ref 70–99)
Potassium: 3.7 mmol/L (ref 3.5–5.1)
Sodium: 139 mmol/L (ref 135–145)

## 2024-01-02 LAB — CBC
HCT: 42 % (ref 36.0–46.0)
Hemoglobin: 14.3 g/dL (ref 12.0–15.0)
MCH: 34.2 pg — ABNORMAL HIGH (ref 26.0–34.0)
MCHC: 34 g/dL (ref 30.0–36.0)
MCV: 100.5 fL — ABNORMAL HIGH (ref 80.0–100.0)
Platelets: 141 K/uL — ABNORMAL LOW (ref 150–400)
RBC: 4.18 MIL/uL (ref 3.87–5.11)
RDW: 14.6 % (ref 11.5–15.5)
WBC: 4.3 K/uL (ref 4.0–10.5)
nRBC: 0 % (ref 0.0–0.2)

## 2024-01-02 LAB — MAGNESIUM: Magnesium: 2.1 mg/dL (ref 1.7–2.4)

## 2024-01-02 MED ORDER — EMPAGLIFLOZIN 10 MG PO TABS: 10.0000 mg | ORAL_TABLET | Freq: Every day | ORAL | 2 refills | Status: AC

## 2024-01-02 MED ORDER — SPIRONOLACTONE 25 MG PO TABS: 12.5000 mg | ORAL_TABLET | Freq: Every day | ORAL | 0 refills | Status: AC

## 2024-01-02 MED ORDER — METOPROLOL TARTRATE 25 MG PO TABS: 25.0000 mg | ORAL_TABLET | Freq: Two times a day (BID) | ORAL | 1 refills | Status: AC

## 2024-01-02 MED ORDER — APIXABAN 5 MG PO TABS: 5.0000 mg | ORAL_TABLET | Freq: Two times a day (BID) | ORAL | 3 refills | Status: AC

## 2024-01-02 MED ORDER — LORATADINE 10 MG PO TABS: 10.0000 mg | ORAL_TABLET | Freq: Every day | ORAL | 0 refills | Status: AC

## 2024-01-02 NOTE — Progress Notes (Signed)
   01/02/24 0916  ReDS Vest / Clip  Station Marker B  Ruler Value 43  ReDS Value Range < 36  ReDS Actual Value 20

## 2024-01-02 NOTE — Progress Notes (Signed)
 Nsg Discharge Note  Admit Date:  01/01/2024 Discharge date: 01/02/2024   Quintavia Robertshaw to be D/C'd Home per MD order.  AVS completed.  Copy for chart, and copy for patient signed, and dated. Patient/caregiver able to verbalize understanding.  Discharge Medication: Allergies as of 01/02/2024   No Known Allergies      Medication List     STOP taking these medications    verapamil  360 MG 24 hr capsule Commonly known as: VERELAN        TAKE these medications    acetaminophen  325 MG tablet Commonly known as: TYLENOL  Take 2 tablets (650 mg total) by mouth every 6 (six) hours as needed for mild pain, fever or headache (or Fever >/= 101).   alendronate 70 MG tablet Commonly known as: FOSAMAX Take 70 mg by mouth every Wednesday.   allopurinol  300 MG tablet Commonly known as: ZYLOPRIM  Take 300 mg by mouth daily.   apixaban  5 MG Tabs tablet Commonly known as: ELIQUIS  Take 1 tablet (5 mg total) by mouth 2 (two) times daily.   atorvastatin  10 MG tablet Commonly known as: LIPITOR Take 10 mg by mouth at bedtime.   empagliflozin  10 MG Tabs tablet Commonly known as: JARDIANCE  Take 1 tablet (10 mg total) by mouth daily. Start taking on: January 03, 2024   ezetimibe  10 MG tablet Commonly known as: ZETIA  Take 10 mg by mouth daily.   ferrous sulfate  325 (65 FE) MG tablet Take 325 mg by mouth 2 (two) times daily.   fluticasone  50 MCG/ACT nasal spray Commonly known as: FLONASE  Place 1 spray into both nostrils daily as needed for allergies.   folic acid  1 MG tablet Commonly known as: FOLVITE  Take 1 mg by mouth daily.   furosemide  20 MG tablet Commonly known as: LASIX  Take 20 mg by mouth daily.   loratadine  10 MG tablet Commonly known as: CLARITIN  Take 10 mg by mouth daily.   loratadine  10 MG tablet Commonly known as: CLARITIN  Take 1 tablet (10 mg total) by mouth daily.   methotrexate  2.5 MG tablet Commonly known as: RHEUMATREX Take 20 mg by mouth every Wednesday.    metoprolol  tartrate 25 MG tablet Commonly known as: LOPRESSOR  Take 1 tablet (25 mg total) by mouth 2 (two) times daily.   nitroGLYCERIN  0.4 MG SL tablet Commonly known as: NITROSTAT  Place 1 tablet (0.4 mg total) under the tongue every 5 (five) minutes as needed for chest pain.   pantoprazole  40 MG tablet Commonly known as: PROTONIX  Take 1 tablet (40 mg total) by mouth daily before breakfast.   Restasis  0.05 % ophthalmic emulsion Generic drug: cycloSPORINE  Place 1 drop into both eyes 2 (two) times daily.   sodium chloride  0.65 % nasal spray Commonly known as: OCEAN Place 1 spray into the nose every 2 (two) hours as needed for congestion (moisture).   spironolactone  25 MG tablet Commonly known as: ALDACTONE  Take 0.5 tablets (12.5 mg total) by mouth daily. Start taking on: January 03, 2024   sucralfate  1 g tablet Commonly known as: CARAFATE  Take 1 g by mouth 2 (two) times daily.   VITAMIN D-3 PO Take 1 tablet by mouth daily.        Discharge Assessment: Vitals:   01/02/24 0442 01/02/24 0816  BP: (!) 104/44 138/71  Pulse: 66 70  Resp: 17   Temp: 97.7 F (36.5 C)   SpO2: 94% 97%   Skin clean, dry and intact without evidence of skin break down, no evidence of skin tears noted.  IV catheter discontinued intact. Site without signs and symptoms of complications - no redness or edema noted at insertion site, patient denies c/o pain - only slight tenderness at site.  Dressing with slight pressure applied.  D/c Instructions-Education: Discharge instructions given to patient/family with verbalized understanding. D/c education completed with patient/family including follow up instructions, medication list, d/c activities limitations if indicated, with other d/c instructions as indicated by MD - patient able to verbalize understanding, all questions fully answered. Patient instructed to return to ED, call 911, or call MD for any changes in condition.  Patient escorted via WC, and  D/C home via private auto.  L[place] JULIANNA Casey, LPN 07/28/7972 88:63 AM

## 2024-01-02 NOTE — Care Management CC44 (Signed)
 Condition Code 44 Documentation Completed  Patient Details  Name: Dana [name] MRN: [id number] Date of Birth: Dec 08, 1940   Condition Code 44 given:  Yes Patient signature on Condition Code 44 notice:  Yes Documentation of 2 MD's agreement:  Yes Code 44 added to claim:  Yes    Sharlyne Stabs, RN 01/02/2024, 11:33 AM

## 2024-01-02 NOTE — Discharge Summary (Signed)
 Physician Discharge Summary  Dana [name] FMW:[id number] DOB: January 14, 1941 DOA: 01/01/2024  PCP: Benjamin Raina Elizabeth, NP  Admit date: 01/01/2024  Discharge date: 01/02/2024  Admitted From:Home  Disposition:  Home  Recommendations for Outpatient Follow-up:  Follow up with PCP in 1-2 weeks Follow-up with cardiologist Dr. Arnetha with referral sent Remain on Eliquis  twice daily for anticoagulation given new onset atrial fibrillation and metoprolol  with verapamil  discontinued Continue on Jardiance  as prescribed as well as spironolactone  Consider addition of ARB in the near future if blood pressures remain stable on current regimen Continue other medications as noted below  Home Health: None  Equipment/Devices: None  Discharge Condition:Stable  CODE STATUS: Full  Diet recommendation: Heart Healthy  Brief/Interim Summary: Dana [name] is a 83 y.o. female with medical history significant of coronary artery disease, hypertension, hyperlipidemia, rheumatoid arthritis, and hypertrophic cardiomyopathy who presented with acute onset of dyspnea and chest pain.  She has been admitted for acute hypoxemic respiratory failure secondary to acute on chronic diastolic CHF exacerbation in the setting of new onset atrial fibrillation.  She has been started on IV diuresis and is now euvolemic and stable for discharge.  2D echocardiogram looks similar to prior with preserved LVEF and significant findings of basal septal hypertrophy.  She will now remain on spironolactone  and Jardiance  and metoprolol  with no further use of verapamil .  She has been started on Eliquis  for anticoagulation as well and should consider initiation of ARB in the near future with follow-up to her cardiologist.  No other acute events or concerns noted this admission.  Discharge Diagnoses:  Principal Problem:   Acute on chronic diastolic CHF (congestive heart failure) (HCC) Active Problems:   Essential hypertension   New onset  atrial fibrillation (HCC)   Coronary artery disease   Mixed hyperlipidemia   Rheumatoid arthritis (HCC)   Gout   GERD without esophagitis   Obesity, class 1  Principal discharge diagnosis: Acute on chronic diastolic CHF exacerbation in the setting of new onset atrial fibrillation.  Discharge Instructions  Discharge Instructions     Ambulatory referral to Cardiology   Complete by: As directed    Diet - low sodium heart healthy   Complete by: As directed    Increase activity slowly   Complete by: As directed       Allergies as of 01/02/2024   No Known Allergies      Medication List     STOP taking these medications    verapamil  360 MG 24 hr capsule Commonly known as: VERELAN        TAKE these medications    acetaminophen  325 MG tablet Commonly known as: TYLENOL  Take 2 tablets (650 mg total) by mouth every 6 (six) hours as needed for mild pain, fever or headache (or Fever >/= 101).   alendronate 70 MG tablet Commonly known as: FOSAMAX Take 70 mg by mouth every Wednesday.   allopurinol  300 MG tablet Commonly known as: ZYLOPRIM  Take 300 mg by mouth daily.   apixaban  5 MG Tabs tablet Commonly known as: ELIQUIS  Take 1 tablet (5 mg total) by mouth 2 (two) times daily.   atorvastatin  10 MG tablet Commonly known as: LIPITOR Take 10 mg by mouth at bedtime.   empagliflozin  10 MG Tabs tablet Commonly known as: JARDIANCE  Take 1 tablet (10 mg total) by mouth daily. Start taking on: January 03, 2024   ezetimibe  10 MG tablet Commonly known as: ZETIA  Take 10 mg by mouth daily.   ferrous sulfate  325 (65 FE) MG  tablet Take 325 mg by mouth 2 (two) times daily.   fluticasone  50 MCG/ACT nasal spray Commonly known as: FLONASE  Place 1 spray into both nostrils daily as needed for allergies.   folic acid  1 MG tablet Commonly known as: FOLVITE  Take 1 mg by mouth daily.   furosemide  20 MG tablet Commonly known as: LASIX  Take 20 mg by mouth daily.   loratadine  10 MG  tablet Commonly known as: CLARITIN  Take 10 mg by mouth daily.   loratadine  10 MG tablet Commonly known as: CLARITIN  Take 1 tablet (10 mg total) by mouth daily.   methotrexate  2.5 MG tablet Commonly known as: RHEUMATREX Take 20 mg by mouth every Wednesday.   metoprolol  tartrate 25 MG tablet Commonly known as: LOPRESSOR  Take 1 tablet (25 mg total) by mouth 2 (two) times daily.   nitroGLYCERIN  0.4 MG SL tablet Commonly known as: NITROSTAT  Place 1 tablet (0.4 mg total) under the tongue every 5 (five) minutes as needed for chest pain.   pantoprazole  40 MG tablet Commonly known as: PROTONIX  Take 1 tablet (40 mg total) by mouth daily before breakfast.   Restasis  0.05 % ophthalmic emulsion Generic drug: cycloSPORINE  Place 1 drop into both eyes 2 (two) times daily.   sodium chloride  0.65 % nasal spray Commonly known as: OCEAN Place 1 spray into the nose every 2 (two) hours as needed for congestion (moisture).   spironolactone  25 MG tablet Commonly known as: ALDACTONE  Take 0.5 tablets (12.5 mg total) by mouth daily. Start taking on: January 03, 2024   sucralfate  1 g tablet Commonly known as: CARAFATE  Take 1 g by mouth 2 (two) times daily.   VITAMIN D-3 PO Take 1 tablet by mouth daily.        Follow-up Information     Nsumanganyi, Kalombo Cesar, NP. Schedule an appointment as soon as possible for a visit in 1 week(s).   Contact information: [address] [place] [address] [phone]         Santo Stanly LABOR, MD. Go to.   Specialty: Cardiology Contact information: [address] [place] [address] [phone]                No Known Allergies  Consultations: None   Procedures/Studies: ECHOCARDIOGRAM COMPLETE Result Date: 01/01/2024    ECHOCARDIOGRAM REPORT   Patient Name:   Dana [name] Date of Exam: 01/01/2024 Medical Rec #:  [id number]    Height:       64.0 in Accession #:    [id number]   Weight:       176.6 lb Date  of Birth:  11-04-1940    BSA:          1.856 m Patient Age:    83 years     BP:           125/71 mmHg Patient Gender: F            HR:           73 bpm. Exam Location:  [facility] Procedure: 2D Echo, Cardiac Doppler, Color Doppler and 3D Echo (Both Spectral            and Color Flow Doppler were utilized during procedure). Indications:    Dyspnea  History:        Patient has prior history of Echocardiogram examinations, most                 recent 07/27/2023. CHF, CAD, Arrythmias:Atrial Fibrillation; Risk  Factors:Hypertension and Dyslipidemia.  Sonographer:    Therisa Crouch Referring Phys: [id number] MAURICIO DANIEL ARRIEN IMPRESSIONS  1. Severe basal septal hypertrophy and SAM of mitral valve leaflet, known HOCM. Resting LVOT gradient was 65 mm Hg and 160 mm Hg with Valsalva. Left ventricular ejection fraction, by estimation, is 60 to 65%. Left ventricular ejection fraction by 3D volume is 62 %. The left ventricle has normal function. The left ventricle has no regional wall motion abnormalities. There is severe asymmetric left ventricular hypertrophy of the septal and basal segments. Left ventricular diastolic parameters are consistent with Grade II diastolic dysfunction (pseudonormalization).  2. Right ventricular systolic function is normal. The right ventricular size is normal. There is mildly elevated pulmonary artery systolic pressure. The estimated right ventricular systolic pressure is 41.9 mmHg.  3. Left atrial size was moderately dilated.  4. The mitral valve is normal in structure. Mild mitral valve regurgitation. No evidence of mitral stenosis.  5. The aortic valve has an indeterminant number of cusps. Aortic valve regurgitation is mild. Moderate aortic valve stenosis. Aortic valve mean gradient measures 22.5 mmHg. Aortic valve Vmax measures 3.26 m/s.  6. The inferior vena cava is normal in size with greater than 50% respiratory variability, suggesting right atrial pressure of 3 mmHg. FINDINGS   Left Ventricle: Left ventricular ejection fraction, by estimation, is 60 to 65%. Left ventricular ejection fraction by 3D volume is 62 %. The left ventricle has normal function. The left ventricle has no regional wall motion abnormalities. Strain was performed and the global longitudinal strain is indeterminate. The left ventricular internal cavity size was normal in size. There is severe asymmetric left ventricular hypertrophy of the septal and basal segments. Left ventricular diastolic parameters are consistent with Grade II diastolic dysfunction (pseudonormalization). Right Ventricle: The right ventricular size is normal. No increase in right ventricular wall thickness. Right ventricular systolic function is normal. There is mildly elevated pulmonary artery systolic pressure. The tricuspid regurgitant velocity is 3.12  m/s, and with an assumed right atrial pressure of 3 mmHg, the estimated right ventricular systolic pressure is 41.9 mmHg. Left Atrium: Left atrial size was moderately dilated. Right Atrium: Right atrial size was normal in size. Pericardium: There is no evidence of pericardial effusion. Mitral Valve: The mitral valve is normal in structure. Mild mitral valve regurgitation, with eccentric posteriorly directed jet. No evidence of mitral valve stenosis. Tricuspid Valve: The tricuspid valve is normal in structure. Tricuspid valve regurgitation is mild . No evidence of tricuspid stenosis. Aortic Valve: The aortic valve has an indeterminant number of cusps. Aortic valve regurgitation is mild. Moderate aortic stenosis is present. Aortic valve mean gradient measures 22.5 mmHg. Aortic valve peak gradient measures 42.5 mmHg. Pulmonic Valve: The pulmonic valve was not well visualized. Pulmonic valve regurgitation is not visualized. No evidence of pulmonic stenosis. Aorta: The aortic root is normal in size and structure. Venous: The inferior vena cava is normal in size with greater than 50% respiratory  variability, suggesting right atrial pressure of 3 mmHg. IAS/Shunts: No atrial level shunt detected by color flow Doppler. Additional Comments: 3D was performed not requiring image post processing on an independent workstation and was normal.  LEFT VENTRICLE PLAX 2D LVIDd:         3.40 cm         Diastology LVIDs:         2.20 cm         LV e' medial:    5.87 cm/s LV PW:  1.30 cm         LV E/e' medial:  22.0 LV IVS:        2.20 cm         LV e' lateral:   10.10 cm/s LVOT diam:     2.00 cm         LV E/e' lateral: 12.8 LVOT Area:     3.14 cm                                 3D Volume EF                                LV 3D EF:    Left                                             ventricul                                             ar                                             ejection                                             fraction                                             by 3D                                             volume is                                             62 %.                                LV 3D EDV:   84.36 ml                                LV 3D ESV:   32.19 ml                                 3D Volume EF:  3D EF:        62 % RIGHT VENTRICLE             IVC RV Basal diam:  2.70 cm     IVC diam: 1.50 cm RV S prime:     10.30 cm/s TAPSE (M-mode): 1.9 cm LEFT ATRIUM             Index LA diam:        3.60 cm 1.94 cm/m LA Vol (A2C):   90.7 ml 48.88 ml/m LA Vol (A4C):   77.3 ml 41.66 ml/m LA Biplane Vol: 83.8 ml 45.16 ml/m  AORTIC VALVE AV Vmax:      326.00 cm/s AV Vmean:     220.500 cm/s AV VTI:       0.634 m AV Peak Grad: 42.5 mmHg AV Mean Grad: 22.5 mmHg  AORTA Ao Root diam: 3.00 cm Ao Asc diam:  3.40 cm MITRAL VALVE                TRICUSPID VALVE MV Area (PHT): 5.23 cm     TR Peak grad:   38.9 mmHg MV Decel Time: 145 msec     TR Vmax:        312.00 cm/s MV E velocity: 129.00 cm/s MV A velocity: 89.60 cm/s   SHUNTS MV E/A ratio:  1.44          Systemic Diam: 2.00 cm Vishnu Priya Mallipeddi Electronically signed by Diannah Late Mallipeddi Signature Date/Time: 01/01/2024/2:32:52 PM    Final    DG Chest Portable 1 View Result Date: 01/01/2024 CLINICAL DATA:  Shortness of breath starting this evening. Chest pain. History of CHF. EXAM: PORTABLE CHEST 1 VIEW COMPARISON:  07/26/2023 FINDINGS: Cardiac enlargement. Perihilar infiltration is progressing since prior study, likely edema but could indicate multifocal pneumonia in the appropriate clinical setting. No pleural effusion or pneumothorax. Mediastinal contours appear intact. Degenerative changes in the spine and shoulders. IMPRESSION: Cardiac enlargement with perihilar infiltrates, likely edema. Electronically Signed   By: Elsie Gravely M.D.   On: 01/01/2024 02:03     Discharge Exam: Vitals:   01/02/24 0442 01/02/24 0816  BP: (!) 104/44 138/71  Pulse: 66 70  Resp: 17   Temp: 97.7 F (36.5 C)   SpO2: 94% 97%   Vitals:   01/01/24 1942 01/02/24 0442 01/02/24 0800 01/02/24 0816  BP: 115/65 (!) 104/44  138/71  Pulse: 73 66  70  Resp: 16 17    Temp: 98.4 F (36.9 C) 97.7 F (36.5 C)    TempSrc: Oral Oral    SpO2: 99% 94%  97%  Weight:  80.6 kg 77.2 kg   Height:        General: Pt is alert, awake, not in acute distress Cardiovascular: RRR, S1/S2 +, no rubs, no gallops, positive murmur and irregular Respiratory: CTA bilaterally, no wheezing, no rhonchi Abdominal: Soft, NT, ND, bowel sounds + Extremities: no edema, no cyanosis    The results of significant diagnostics from this hospitalization (including imaging, microbiology, ancillary and laboratory) are listed below for reference.     Microbiology: Recent Results (from the past 240 hours)  MRSA Next Gen by PCR, Nasal     Status: None   Collection Time: 01/01/24  4:48 AM   Specimen: Nasal Mucosa; Nasal Swab  Result Value Ref Range Status   MRSA by PCR Next Gen NOT DETECTED NOT DETECTED Final    Comment: (NOTE) The  GeneXpert MRSA Assay (FDA approved for NASAL specimens  only), is one component of a comprehensive MRSA colonization surveillance program. It is not intended to diagnose MRSA infection nor to guide or monitor treatment for MRSA infections. Test performance is not FDA approved in patients less than 29 years old. Performed at [facility], [address] [place], [place] [address]      Labs: BNP (last 3 results) Recent Labs    07/26/23 0953 01/01/24 0119  BNP 269.0* 595.0*   Basic Metabolic Panel: Recent Labs  Lab 01/01/24 0119 01/01/24 0800 01/02/24 0428  NA 142 139 139  K 3.4* 3.3* 3.7  CL 107 104 101  CO2 22 25 26   GLUCOSE 104* 118* 100*  BUN 18 17 18   CREATININE 0.79 0.79 0.96  CALCIUM  8.5* 8.2* 8.9  MG  --   --  2.1   Liver Function Tests: Recent Labs  Lab 01/01/24 0119  AST 25  ALT 13  ALKPHOS 63  BILITOT 0.8  PROT 7.0  ALBUMIN 3.4*   No results for input(s): LIPASE, AMYLASE in the last 168 hours. No results for input(s): AMMONIA in the last 168 hours. CBC: Recent Labs  Lab 01/01/24 0119 01/02/24 0428  WBC 6.7 4.3  NEUTROABS 4.7  --   HGB 13.2 14.3  HCT 38.6 42.0  MCV 100.8* 100.5*  PLT 166 141*   Cardiac Enzymes: No results for input(s): CKTOTAL, CKMB, CKMBINDEX, TROPONINI in the last 168 hours. BNP: Invalid input(s): POCBNP CBG: No results for input(s): GLUCAP in the last 168 hours. D-Dimer No results for input(s): DDIMER in the last 72 hours. Hgb A1c No results for input(s): HGBA1C in the last 72 hours. Lipid Profile No results for input(s): CHOL, HDL, LDLCALC, TRIG, CHOLHDL, LDLDIRECT in the last 72 hours. Thyroid  function studies Recent Labs    01/01/24 0800  TSH 2.694   Anemia work up No results for input(s): VITAMINB12, FOLATE, FERRITIN, TIBC, IRON, RETICCTPCT in the last 72 hours. Urinalysis    Component Value Date/Time   COLORURINE YELLOW 07/03/2023 1203   APPEARANCEUR CLEAR  07/03/2023 1203   LABSPEC 1.009 07/03/2023 1203   PHURINE 5.0 07/03/2023 1203   GLUCOSEU NEGATIVE 07/03/2023 1203   HGBUR NEGATIVE 07/03/2023 1203   BILIRUBINUR NEGATIVE 07/03/2023 1203   KETONESUR NEGATIVE 07/03/2023 1203   PROTEINUR NEGATIVE 07/03/2023 1203   NITRITE NEGATIVE 07/03/2023 1203   LEUKOCYTESUR NEGATIVE 07/03/2023 1203   Sepsis Labs Recent Labs  Lab 01/01/24 0119 01/02/24 0428  WBC 6.7 4.3   Microbiology Recent Results (from the past 240 hours)  MRSA Next Gen by PCR, Nasal     Status: None   Collection Time: 01/01/24  4:48 AM   Specimen: Nasal Mucosa; Nasal Swab  Result Value Ref Range Status   MRSA by PCR Next Gen NOT DETECTED NOT DETECTED Final    Comment: (NOTE) The GeneXpert MRSA Assay (FDA approved for NASAL specimens only), is one component of a comprehensive MRSA colonization surveillance program. It is not intended to diagnose MRSA infection nor to guide or monitor treatment for MRSA infections. Test performance is not FDA approved in patients less than 46 years old. Performed at [facility], [address], [place], [place] [address]      Time coordinating discharge: 35 minutes  SIGNED:   Adron JONETTA Fairly, DO [facility] 01/02/2024, 10:13 AM  If 7PM-7AM, please contact night-coverage [email or web address]

## 2024-01-02 NOTE — Care Management Obs Status (Signed)
 MEDICARE OBSERVATION STATUS NOTIFICATION   Patient Details  Name: Dana [name] MRN: [id number] Date of Birth: [birth year]   Medicare Observation Status Notification Given:  Yes    Sharlyne Stabs, RN 01/02/2024, 11:32 AM

## 2024-01-03 ENCOUNTER — Other Ambulatory Visit: Payer: Self-pay

## 2024-01-03 DIAGNOSIS — D649 Anemia, unspecified: Secondary | ICD-10-CM

## 2024-01-03 DIAGNOSIS — D72819 Decreased white blood cell count, unspecified: Secondary | ICD-10-CM

## 2024-01-03 DIAGNOSIS — D500 Iron deficiency anemia secondary to blood loss (chronic): Secondary | ICD-10-CM

## 2024-01-04 ENCOUNTER — Inpatient Hospital Stay: Payer: Medicare Other | Attending: Hematology

## 2024-01-04 DIAGNOSIS — D500 Iron deficiency anemia secondary to blood loss (chronic): Secondary | ICD-10-CM | POA: Diagnosis not present

## 2024-01-04 DIAGNOSIS — D72819 Decreased white blood cell count, unspecified: Secondary | ICD-10-CM | POA: Insufficient documentation

## 2024-01-04 DIAGNOSIS — D649 Anemia, unspecified: Secondary | ICD-10-CM

## 2024-01-04 DIAGNOSIS — R7402 Elevation of levels of lactic acid dehydrogenase (LDH): Secondary | ICD-10-CM | POA: Insufficient documentation

## 2024-01-04 LAB — IRON AND TIBC
Iron: 88 ug/dL (ref 28–170)
Saturation Ratios: 31 % (ref 10.4–31.8)
TIBC: 285 ug/dL (ref 250–450)
UIBC: 197 ug/dL

## 2024-01-04 LAB — CBC WITH DIFFERENTIAL/PLATELET
Abs Immature Granulocytes: 0 K/uL (ref 0.00–0.07)
Basophils Absolute: 0 K/uL (ref 0.0–0.1)
Basophils Relative: 1 %
Eosinophils Absolute: 0.1 K/uL (ref 0.0–0.5)
Eosinophils Relative: 2 %
HCT: 42.6 % (ref 36.0–46.0)
Hemoglobin: 14.3 g/dL (ref 12.0–15.0)
Immature Granulocytes: 0 %
Lymphocytes Relative: 17 %
Lymphs Abs: 0.7 K/uL (ref 0.7–4.0)
MCH: 34.2 pg — ABNORMAL HIGH (ref 26.0–34.0)
MCHC: 33.6 g/dL (ref 30.0–36.0)
MCV: 101.9 fL — ABNORMAL HIGH (ref 80.0–100.0)
Monocytes Absolute: 0.3 K/uL (ref 0.1–1.0)
Monocytes Relative: 7 %
Neutro Abs: 3.1 K/uL (ref 1.7–7.7)
Neutrophils Relative %: 73 %
Platelets: 166 K/uL (ref 150–400)
RBC: 4.18 MIL/uL (ref 3.87–5.11)
RDW: 14.2 % (ref 11.5–15.5)
WBC: 4.2 K/uL (ref 4.0–10.5)
nRBC: 0 % (ref 0.0–0.2)

## 2024-01-04 LAB — COMPREHENSIVE METABOLIC PANEL WITH GFR
ALT: 12 U/L (ref 0–44)
AST: 22 U/L (ref 15–41)
Albumin: 3.7 g/dL (ref 3.5–5.0)
Alkaline Phosphatase: 69 U/L (ref 38–126)
Anion gap: 9 (ref 5–15)
BUN: 22 mg/dL (ref 8–23)
CO2: 27 mmol/L (ref 22–32)
Calcium: 9.2 mg/dL (ref 8.9–10.3)
Chloride: 103 mmol/L (ref 98–111)
Creatinine, Ser: 0.98 mg/dL (ref 0.44–1.00)
GFR, Estimated: 58 mL/min — ABNORMAL LOW (ref >60)
Glucose, Bld: 147 mg/dL — ABNORMAL HIGH (ref 70–99)
Potassium: 4.1 mmol/L (ref 3.5–5.1)
Sodium: 139 mmol/L (ref 135–145)
Total Bilirubin: 0.7 mg/dL (ref 0.0–1.2)
Total Protein: 8 g/dL (ref 6.5–8.1)

## 2024-01-04 LAB — FERRITIN: Ferritin: 563 ng/mL — ABNORMAL HIGH (ref 11–307)

## 2024-01-04 LAB — FOLATE: Folate: 21.3 ng/mL (ref >5.9)

## 2024-01-07 LAB — METHYLMALONIC ACID, SERUM: Methylmalonic Acid, Quantitative: 267 nmol/L (ref 0–378)

## 2024-01-11 ENCOUNTER — Inpatient Hospital Stay (HOSPITAL_BASED_OUTPATIENT_CLINIC_OR_DEPARTMENT_OTHER): Payer: Medicare Other | Admitting: Oncology

## 2024-01-11 DIAGNOSIS — D649 Anemia, unspecified: Secondary | ICD-10-CM

## 2024-01-11 DIAGNOSIS — D72819 Decreased white blood cell count, unspecified: Secondary | ICD-10-CM | POA: Diagnosis not present

## 2024-01-11 DIAGNOSIS — D500 Iron deficiency anemia secondary to blood loss (chronic): Secondary | ICD-10-CM

## 2024-01-11 NOTE — Progress Notes (Signed)
 PCP:  Benjamin Raina Elizabeth, NP [address] [place] [address] [phone]  REASON FOR VISIT:  Follow-up for evaluation of mild leukopenia  PRIOR THERAPY: None  CURRENT THERAPY: Surveillance  INTERVAL HISTORY:  Dana [name] 83 y.o. female returns for routine follow-up of leukopenia and iron deficiency anemia.  She was last seen by me on 07/13/2023.  She received 2 doses of IV Feraheme on 07/17/2023 and 07/24/2023.  Tolerated infusions well.  Patient was hospitalized from 07/26/2023 through 07/27/2023 for chest pain.  She was hospitalized again from 7/7/5 through 01/02/2024 for heart failure.   She continues to have occasional chest pain but this is improving since her recent hospitalization.  She is here with her daughter-in-law.  Reports an appetite and energy levels of 100%.  Hematochezia..    Since her last visit, she has not had any infections.  She denies any B symptoms such as fever, chills, night sweats, or unexplained weight loss.  She has not noticed any new lumps or bumps.  She remains on methotrexate  20 mg weekly for her rheumatoid arthritis.  She denies any signs of bleeding such as rectal bleeding or melena.  REVIEW OF SYSTEMS:   Patient denies any acute complaints at today's visit. Review of Systems  Respiratory:  Positive for chest tightness (at times).   Cardiovascular:  Positive for chest pain.     PAST MEDICAL/SURGICAL HISTORY:  Past Medical History:  Diagnosis Date   CAD (coronary artery disease)    a. s/p DES to OM1 in 02/2017 b. patent stent by cath in 11/2022   Chest pain    Essential hypertension    Gout    HOCM (hypertrophic obstructive cardiomyopathy) (HCC)    Hyperlipidemia    Rheumatoid arthritis (HCC)    Past Surgical History:  Procedure Laterality Date   BIOPSY  10/20/2022   Procedure: BIOPSY;  Surgeon: Eda Iha, MD;  Location: [facility] ENDOSCOPY;  Service: Gastroenterology;;   CHOLECYSTECTOMY N/A 08/17/2018    Procedure: LAPAROSCOPIC CHOLECYSTECTOMY;  Surgeon: Mavis Anes, MD;  Location: [facility] ORS;  Service: General;  Laterality: N/A;   COLONOSCOPY WITH PROPOFOL  N/A 10/20/2022   Procedure: COLONOSCOPY WITH PROPOFOL ;  Surgeon: Eda Iha, MD;  Location: [facility] ENDOSCOPY;  Service: Gastroenterology;  Laterality: N/A;   CORONARY BALLOON ANGIOPLASTY N/A 03/03/2017   Procedure: CORONARY BALLOON ANGIOPLASTY;  Surgeon: Anner Alm ORN, MD;  Location: [facility] INVASIVE CV LAB;  Service: Cardiovascular;  Laterality: N/A;   CORONARY STENT INTERVENTION N/A 03/03/2017   Procedure: CORONARY STENT INTERVENTION;  Surgeon: Anner Alm ORN, MD;  Location: [facility] INVASIVE CV LAB;  Service: Cardiovascular;  Laterality: N/A;   ESOPHAGOGASTRODUODENOSCOPY (EGD) WITH PROPOFOL  N/A 10/20/2022   Procedure: ESOPHAGOGASTRODUODENOSCOPY (EGD) WITH PROPOFOL ;  Surgeon: Eda Iha, MD;  Location: [facility] ENDOSCOPY;  Service: Gastroenterology;  Laterality: N/A;   LEFT HEART CATH AND CORONARY ANGIOGRAPHY N/A 03/02/2017   Procedure: LEFT HEART CATH AND CORONARY ANGIOGRAPHY;  Surgeon: Wonda Sharper, MD;  Location: [facility] INVASIVE CV LAB;  Service: Cardiovascular;  Laterality: N/A;   LEFT HEART CATH AND CORONARY ANGIOGRAPHY N/A 12/08/2022   Procedure: LEFT HEART CATH AND CORONARY ANGIOGRAPHY;  Surgeon: Wendel Lurena POUR, MD;  Location: [facility] INVASIVE CV LAB;  Service: Cardiovascular;  Laterality: N/A;   POLYPECTOMY  10/20/2022   Procedure: POLYPECTOMY;  Surgeon: Eda Iha, MD;  Location: [facility] ENDOSCOPY;  Service: Gastroenterology;;     SOCIAL HISTORY:  Social History   Socioeconomic History   Marital status: Widowed    Spouse name: Not  on file   Number of children: Not on file   Years of education: Not on file   Highest education level: Not on file  Occupational History   Not on file  Tobacco Use   Smoking status: Never   Smokeless tobacco: Never  Vaping Use   Vaping status: Never Used  Substance and Sexual Activity   Alcohol use: No    Drug use: No   Sexual activity: Not Currently  Other Topics Concern   Not on file  Social History Narrative   Not on file   Social Drivers of Health   Financial Resource Strain: Not on file  Food Insecurity: No Food Insecurity (01/01/2024)   Hunger Vital Sign    Worried About Running Out of Food in the Last Year: Never true    Ran Out of Food in the Last Year: Never true  Transportation Needs: No Transportation Needs (01/01/2024)   PRAPARE - Administrator, Civil Service (Medical): No    Lack of Transportation (Non-Medical): No  Physical Activity: Not on file  Stress: Not on file  Social Connections: Moderately Integrated (01/01/2024)   Social Connection and Isolation Panel    Frequency of Communication with Friends and Family: Twice a week    Frequency of Social Gatherings with Friends and Family: Twice a week    Attends Religious Services: More than 4 times per year    Active Member of [employer] or Organizations: Yes    Attends Banker Meetings: More than 4 times per year    Marital Status: Widowed  Intimate Partner Violence: Not At Risk (01/01/2024)   Humiliation, Afraid, Rape, and Kick questionnaire    Fear of Current or Ex-Partner: No    Emotionally Abused: No    Physically Abused: No    Sexually Abused: No    FAMILY HISTORY:  Family History  Problem Relation Age of Onset   Hypertension Mother    Liver disease Mother    Alcohol abuse Father     CURRENT MEDICATIONS:  Outpatient Encounter Medications as of 01/11/2024  Medication Sig   acetaminophen  (TYLENOL ) 325 MG tablet Take 2 tablets (650 mg total) by mouth every 6 (six) hours as needed for mild pain, fever or headache (or Fever >/= 101).   alendronate (FOSAMAX) 70 MG tablet Take 70 mg by mouth every Wednesday.   allopurinol  (ZYLOPRIM ) 300 MG tablet Take 300 mg by mouth daily.   apixaban  (ELIQUIS ) 5 MG TABS tablet Take 1 tablet (5 mg total) by mouth 2 (two) times daily.   atorvastatin  (LIPITOR) 10  MG tablet Take 10 mg by mouth at bedtime.   Cholecalciferol  (VITAMIN D-3 PO) Take 1 tablet by mouth daily.   empagliflozin  (JARDIANCE ) 10 MG TABS tablet Take 1 tablet (10 mg total) by mouth daily.   ezetimibe  (ZETIA ) 10 MG tablet Take 10 mg by mouth daily.   ferrous sulfate  325 (65 FE) MG tablet Take 325 mg by mouth 2 (two) times daily.   fluticasone  (FLONASE ) 50 MCG/ACT nasal spray Place 1 spray into both nostrils daily as needed for allergies.   folic acid  (FOLVITE ) 1 MG tablet Take 1 mg by mouth daily.   furosemide  (LASIX ) 20 MG tablet Take 20 mg by mouth daily.   loratadine  (CLARITIN ) 10 MG tablet Take 10 mg by mouth daily.   loratadine  (CLARITIN ) 10 MG tablet Take 1 tablet (10 mg total) by mouth daily.   methotrexate  2.5 MG tablet Take 20 mg by mouth every  Wednesday.    metoprolol  tartrate (LOPRESSOR ) 25 MG tablet Take 1 tablet (25 mg total) by mouth 2 (two) times daily.   nitroGLYCERIN  (NITROSTAT ) 0.4 MG SL tablet Place 1 tablet (0.4 mg total) under the tongue every 5 (five) minutes as needed for chest pain.   pantoprazole  (PROTONIX ) 40 MG tablet Take 1 tablet (40 mg total) by mouth daily before breakfast.   RESTASIS  0.05 % ophthalmic emulsion Place 1 drop into both eyes 2 (two) times daily.   sodium chloride  (OCEAN) 0.65 % nasal spray Place 1 spray into the nose every 2 (two) hours as needed for congestion (moisture).   spironolactone  (ALDACTONE ) 25 MG tablet Take 0.5 tablets (12.5 mg total) by mouth daily.   sucralfate  (CARAFATE ) 1 g tablet Take 1 g by mouth 2 (two) times daily.   Facility-Administered Encounter Medications as of 01/11/2024  Medication   acetaminophen  (TYLENOL ) tablet 650 mg   diphenhydrAMINE  (BENADRYL ) capsule 25 mg   ferumoxytol  (FERAHEME) 510 mg in sodium chloride  0.9 % 100 mL IVPB    ALLERGIES:  No Known Allergies   PHYSICAL EXAM  Physical Exam Constitutional:      Appearance: Normal appearance.  HENT:     Head: Normocephalic and atraumatic.  Eyes:      Pupils: Pupils are equal, round, and reactive to light.  Cardiovascular:     Rate and Rhythm: Normal rate and regular rhythm.     Heart sounds: Normal heart sounds. No murmur heard. Pulmonary:     Effort: Pulmonary effort is normal.     Breath sounds: Normal breath sounds. No wheezing.  Abdominal:     General: Bowel sounds are normal. There is no distension.     Palpations: Abdomen is soft.     Tenderness: There is no abdominal tenderness.  Musculoskeletal:        General: Normal range of motion.     Cervical back: Normal range of motion.  Skin:    General: Skin is warm and dry.     Findings: No rash.  Neurological:     Mental Status: She is alert and oriented to person, place, and time.  Psychiatric:        Judgment: Judgment normal.          06/30/2022    8:05 AM 05/10/2022    9:03 AM 05/10/2022    9:01 AM  Vitals with BMI  Weight 175 lbs 1 oz    Systolic 134 101 891  Diastolic 72 56 54  Pulse 69 65 65       LABORATORY DATA:  I have reviewed the labs as listed.  CBC    Component Value Date/Time   WBC 4.2 01/04/2024 0952   RBC 4.18 01/04/2024 0952   HGB 14.3 01/04/2024 0952   HGB 10.1 (L) 12/06/2022 1050   HCT 42.6 01/04/2024 0952   HCT 31.1 (L) 12/06/2022 1050   PLT 166 01/04/2024 0952   PLT 197 12/06/2022 1050   MCV 101.9 (H) 01/04/2024 0952   MCV 102 (H) 12/06/2022 1050   MCH 34.2 (H) 01/04/2024 0952   MCHC 33.6 01/04/2024 0952   RDW 14.2 01/04/2024 0952   RDW 16.3 (H) 12/06/2022 1050   LYMPHSABS 0.7 01/04/2024 0952   MONOABS 0.3 01/04/2024 0952   EOSABS 0.1 01/04/2024 0952   BASOSABS 0.0 01/04/2024 0952      Latest Ref Rng & Units 01/04/2024    9:52 AM 01/02/2024    4:28 AM 01/01/2024    8:00 AM  CMP  Glucose 70 - 99 mg/dL 852  899  881   BUN 8 - 23 mg/dL 22  18  17    Creatinine 0.44 - 1.00 mg/dL 9.01  9.03  9.20   Sodium 135 - 145 mmol/L 139  139  139   Potassium 3.5 - 5.1 mmol/L 4.1  3.7  3.3   Chloride 98 - 111 mmol/L 103  101  104   CO2 22 -  32 mmol/L 27  26  25    Calcium  8.9 - 10.3 mg/dL 9.2  8.9  8.2   Total Protein 6.5 - 8.1 g/dL 8.0     Total Bilirubin 0.0 - 1.2 mg/dL 0.7     Alkaline Phos 38 - 126 U/L 69     AST 15 - 41 U/L 22     ALT 0 - 44 U/L 12       DIAGNOSTIC IMAGING:  I have independently reviewed the relevant imaging and discussed with the patient.  ASSESSMENT & PLAN: 1.  Leukopenia - Here at the request of her primary care provider (NP Vernell Beams) for the evaluation of leukopenia - Lab records sent by PCP included CBC (06/11/2021) with mildly low WBC 3.3 with normal differential, normal Hgb 11.7/MCV 97, normal platelets 238.  Prior labs (03/19/2021) show WBC 3.3 with normal differential.  Before that, patient had normal WBC 4.1 (07/08/2020). - She denies any frequent infections or B symptoms    - No lymphadenopathy or hepatosplenomegaly on exam.     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  Iron deficiency anemia - Being managed by primary care provider - Currently taking ferrous sulfate  325 mg daily - No rectal bleeding or melena -Additional anemia workup (06/23/2022): Normal B12, folate, MMA, copper .  CMP shows normal kidney function.  Ferritin 70 with iron saturation 25%.  Normal SPEP and immunofixation.  Mild elevations in kappa and lambda light chains, but with normal ratio 1.44. -Suspect anemia of chronic disease versus anemia secondary to methotrexate .  May also represent early MDS.   3.  Other history - PMH:  Hypertrophic obstructive cardiomyopathy, hypertension, CAD s/p  stents, diastolic congestive heart failure, gouty arthritis, hyperlipidemia, GERD, iron deficiency anemia, and rheumatoid arthritis - SOCIAL: She lives at home.  She worked as a Futures trader.  No tobacco, alcohol, illicit drug use. - FAMILY: No known blood disorders or malignancy in the patient's family.   PLAN: 1. Leukopenia, unspecified type (Primary) - Labs from 07/03/2023 show white blood cell count of 4.2 (4.3.). 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 - If she develops any significant neutropenia or other cytopenias in the future, we will speak with Dr. Curt (rheumatology in [place]) and see if we can decrease the dose of her methotrexate .  2. Iron deficiency anemia secondary to blood loss (chronic) - Most recent labs (01/04/2024): Show ferritin 563, iron saturation 31%, and TIBC 285.  Hemoglobin has improved to 14.3 42.6 with MCV count at 166. -She received 2 doses of IV Feraheme on 07/17/2023 and 07/24/2023. -No additional IV iron needed at this time. -Continue iron tablet daily as prescribed by PCP -Return to clinic  in 6 months with labs a few days before.   PLAN SUMMARY: >> No additional iron needed at this time.  >> If labs appear stable in the next 6 months will discharge from clinic and she can follow-up with PCP. >> Return to clinic in approximately 6 months with labs a few days before and office visit.      I discussed the assessment and treatment plan with the patient. The patient was provided an opportunity to ask questions and all were answered. The patient agreed with the plan and demonstrated an understanding of the instructions.   The patient was advised to call or seek an in-person evaluation if the symptoms worsen or if the condition fails to improve as anticipated.  Medical decision making: Low  I spent 20 minutes dedicated to the care of this patient (face-to-face and non-face-to-face) on the date  of the encounter to include what is described in the assessment and plan.  Delon Hope, NP 01/11/2024 10:22 AM

## 2024-02-28 NOTE — Progress Notes (Signed)
 Cardiology Office Note    Date:  03/02/2024  ID:  Dana [name], DOB [birth year], MRN [id number] PCP:  Benjamin Raina Elizabeth, NP  Cardiologist:  Stanly DELENA Leavens, MD  Electrophysiologist:  None   Chief Complaint: Follow-up for HOCM   History of Present Illness: .    Dana [name] is a 83 y.o. female with visit-pertinent history of CAD s/p DES to OM1 in 2018 with 60% stenosis along first marginal bifurcation lesion jailed by initial stent and treated with PTCA, hokum with septal variant, RA, anemia and gout.  Patient was seen by Dr. Leavens in 11/2022 and denied any recent chest pain, dyspnea on exertion or fatigue at that time.  It was felt that she would not tolerate dual AV nodal blocking agents for hypertrophic cardiomyopathy therefore she was continued on Lasix  20 mg daily and verapamil  360 mg daily.  Cardiac MRI was also recommended.  Given recent NSTEMI and no recurrent bleeding, cardiac catheterization was recommended for definitive evaluation.  This was performed by Dr. Wendel on 12/08/2022 and showed patent OM stent with mild disease elsewhere and medical therapy was recommended.  In June 2024 patient deferred genetic testing and cardiac MRI.   Patient was last seen in clinic on 05/2623 by Dr. Leavens, septal reduction therapy and alcohol septal ablation and cardiac myosin inhibitor such as mavacamten were discussed, patient and family opted to defer aggressive treatments due to minimal symptoms and potential risk.  Patient's Lasix  was adjusted to every other day based on symptoms.  Repeat echocardiogram was recommended in 11 months, patient noted that she is not interested in primary prevention ICD.  On chart review in 06/2023 patient was seen by inpatient cardiology for chest pain that had started at rest associated with shortness of breath.  Troponins were flat, not consistent with ACS.  Her echocardiogram indicated LVEF 70 to 75%, no WMA, hokum resting gradient 21  mmHg and Valsalva 50 mmHg.  On chart review patient was admitted on 7/7 through 7/8 who presented with acute onset of dyspnea and chest pain.  Patient was admitted for acute hypoxic respiratory failure secondary to acute on chronic diastolic CHF exacerbation in setting of new onset atrial fibrillation.  Patient was started on IV diuresis and discharged the following day.  She was started on spironolactone , Jardiance  and metoprolol  with verapamil  being discontinued.  She was started on Eliquis  for anticoagulation.  Echocardiogram at that time indicated severe basal septal hypertrophy and SAM of the mitral valve leaflet, resting LVOT to gradient was 65 mmHg and 160 mmHg with Valsalva, LVEF 60 to 65%, no RWMA, severe asymmetric LVH of the septal and basal segments, grade 2 diastolic dysfunction, RV systolic function and size was normal, mildly elevated PASP, LA was moderately dilated, moderate aortic valve stenosis.  Today she presents today for follow up with her daughter.  They have declined interpreter services.  Patient and her daughter report that she has been feeling very well, notes that she has been tolerating her new medications very well.  She denies any chest pain, shortness of breath, lower extremity edema, orthopnea or PND.  They report that she has been tolerating her medications well.  Patient denies any significant bleeding problems on Eliquis .  Denies any palpitations, presyncope or syncope.   ROS: .   Today she denies chest pain, shortness of breath, lower extremity edema, fatigue, palpitations, melena, hematuria, hemoptysis, diaphoresis, weakness, presyncope, syncope, orthopnea, and PND.  All other systems are reviewed and otherwise negative. Studies Reviewed: .  EKG:  EKG is ordered today, personally reviewed, demonstrating  EKG Interpretation Date/Time:  Friday March 01 2024 10:47:47 EDT Ventricular Rate:  69 PR Interval:  202 QRS Duration:  112 QT Interval:  474 QTC  Calculation: 507 R Axis:   -10  Text Interpretation: Normal sinus rhythm Left ventricular hypertrophy ( R in aVL , Sokolow-Lyon , Cornell product ) Left bundle branch block Confirmed by Garcia Dalzell [phone]) on 03/01/2024 5:01:12 PM   CV Studies: Cardiac studies reviewed are outlined and summarized above. Otherwise please see EMR for full report. Cardiac Studies & Procedures   ______________________________________________________________________________________________ CARDIAC CATHETERIZATION  CARDIAC CATHETERIZATION 03/02/2017  Conclusion 1. Severe single-vessel coronary artery disease involving a large first obtuse marginal branch of the left dominant circumflex 2. Moderate LAD stenosis 3. Small nondominant RCA with no significant obstruction 4. Mild to moderate left main stenosis 5. Known hypertrophic cardiomyopathy  Recommend PCI of the obtuse marginal. 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 The patient will be loaded with Plavix . Will schedule her PCI for tomorrow. Would use femoral artery access and consider either a 5 [place] guide catheter or a guide catheter with sideholes. Plan discussed at length with the patient's daughter. Note the patient is non-English speaking.  Findings Coronary Findings Diagnostic  Dominance: Left  Left Main  Left Anterior Descending  Left Circumflex  First Obtuse Marginal Branch  Lateral First Obtuse Marginal Branch  Right Coronary Artery There is mild the vessel.  Intervention  No interventions have been documented.   CARDIAC CATHETERIZATION  CARDIAC CATHETERIZATION 12/08/2022  Conclusion   Ost LM lesion is 20% stenosed.   1st Mrg lesion is 10% stenosed.   Mid LAD lesion is 30% stenosed.  1.  Patent obtuse marginal stent with mild diffuse disease elsewhere. 2.  LVEDP of 21 mmHg. 3.  Upper extremity tortuosity requiring destination sheath for catheter  engagement.  Recommendation: Medical therapy.  Findings Coronary Findings Diagnostic  Dominance: Left  Left Main Ost LM lesion is 20% stenosed.  Left Anterior Descending There is mild diffuse disease throughout the vessel. Mid LAD lesion is 30% stenosed.  Left Circumflex There is mild diffuse disease throughout the vessel.  First Obtuse Marginal Branch 1st Mrg lesion is 10% stenosed. The lesion was previously treated .  Right Coronary Artery The vessel exhibits minimal luminal irregularities.  Intervention  No interventions have been documented.   STRESS TESTS  NM PET CT CARDIAC PERFUSION MULTI W/ABSOLUTE BLOODFLOW 05/10/2022  Narrative   Findings are consistent with no prior ischemia and no prior myocardial infarction. The study is intermediate risk due to the presence of decreased global myocardial blood flow reserve which may indicate the presence of microvascular disease, however, cannot rule out underlying obstructive CAD given the presence of extensive coronary calcification. Could consider cath for further evaluation if clinically indicated.   LV perfusion is normal.   Rest left ventricular function is normal. Rest EF: 55 %. Stress left ventricular function is normal. Stress EF: 60 %. End diastolic cavity size is normal. End systolic cavity size is normal.   Myocardial blood flow was computed to be 0.39ml/g/min at rest and 1.14ml/g/min at stress. Global myocardial blood flow reserve was 1.29 and was abnormal.   Coronary calcium  was present on the attenuation correction CT images. Severe coronary calcifications were present. Coronary calcifications were present in the left anterior descending artery, left circumflex artery and right coronary artery distribution(s).  CLINICAL DATA:  This  over-read does not include interpretation of cardiac or coronary anatomy or pathology. The cardiac PET CT evaluation interpretation by the cardiologist is attached.  COMPARISON:   August 01, 2021  FINDINGS: Vascular: Please see dedicated report for cardiovascular details. There is aortic atherosclerosis and coronary artery calcification.  Mediastinum/Nodes:  Patulous esophagus. Mediastinal lymphadenopathy.  (Image 13/2) 17 mm RIGHT paratracheal lymph node may measured as much as 15-16 mm in 2018. Top-normal lymph node in the AP window (image 10/2) 14 mm. 10 mm in 2023 and new since 2018.  Increase in number of smaller lymph nodes in the pre-vascular space and upper AP window. No gross hilar adenopathy.  Lungs/Pleura: Patchy basilar atelectasis, no signs of effusion or consolidation.  Upper Abdomen: No acute upper abdominal process to the extent evaluated.  Musculoskeletal: No acute or destructive bone findings.  IMPRESSION: 1. Mediastinal nodal enlargement along the RIGHT paratracheal chain similar to February of 2023 and little changed compared to the study of August of 2018. New top-normal AP window lymph node size in generalized increase in number of lymph nodes in the chest. This finding can be seen in the setting of heart failure and should be correlated clinically. Given size of lymph nodes could consider 3 to six-month follow-up to ensure stability. 2. Aortic atherosclerosis and coronary artery calcification.  Aortic Atherosclerosis (ICD10-I70.0).   Electronically Signed By: Isla Blind M.D. On: 05/10/2022 11:17   ECHOCARDIOGRAM  ECHOCARDIOGRAM COMPLETE 01/01/2024  Narrative ECHOCARDIOGRAM REPORT    Patient Name:   Dana [name] Date of Exam: 01/01/2024 Medical Rec #:  [id number]    Height:       64.0 in Accession #:    [id number]   Weight:       176.6 lb Date of Birth:  March 20, 1941    BSA:          1.856 m Patient Age:    83 years     BP:           125/71 mmHg Patient Gender: F            HR:           73 bpm. Exam Location:  [facility]  Procedure: 2D Echo, Cardiac Doppler, Color Doppler and 3D Echo (Both Spectral and Color Flow  Doppler were utilized during procedure).  Indications:    Dyspnea  History:        Patient has prior history of Echocardiogram examinations, most recent 07/27/2023. CHF, CAD, Arrythmias:Atrial Fibrillation; Risk Factors:Hypertension and Dyslipidemia.  Sonographer:    Therisa Crouch Referring Phys: [id number] MAURICIO DANIEL ARRIEN  IMPRESSIONS   1. Severe basal septal hypertrophy and SAM of mitral valve leaflet, known HOCM. Resting LVOT gradient was 65 mm Hg and 160 mm Hg with Valsalva. Left ventricular ejection fraction, by estimation, is 60 to 65%. Left ventricular ejection fraction by 3D volume is 62 %. The left ventricle has normal function. The left ventricle has no regional wall motion abnormalities. There is severe asymmetric left ventricular hypertrophy of the septal and basal segments. Left ventricular diastolic parameters are consistent with Grade II diastolic dysfunction (pseudonormalization). 2. Right ventricular systolic function is normal. The right ventricular size is normal. There is mildly elevated pulmonary artery systolic pressure. The estimated right ventricular systolic pressure is 41.9 mmHg. 3. Left atrial size was moderately dilated. 4. The mitral valve is normal in structure. Mild mitral valve regurgitation. No evidence of mitral stenosis. 5. The aortic valve has an indeterminant number of cusps. Aortic valve regurgitation  is mild. Moderate aortic valve stenosis. Aortic valve mean gradient measures 22.5 mmHg. Aortic valve Vmax measures 3.26 m/s. 6. The inferior vena cava is normal in size with greater than 50% respiratory variability, suggesting right atrial pressure of 3 mmHg.  FINDINGS Left Ventricle: Left ventricular ejection fraction, by estimation, is 60 to 65%. Left ventricular ejection fraction by 3D volume is 62 %. The left ventricle has normal function. The left ventricle has no regional wall motion abnormalities. Strain was performed and the global longitudinal  strain is indeterminate. The left ventricular internal cavity size was normal in size. There is severe asymmetric left ventricular hypertrophy of the septal and basal segments. Left ventricular diastolic parameters are consistent with Grade II diastolic dysfunction (pseudonormalization).  Right Ventricle: The right ventricular size is normal. No increase in right ventricular wall thickness. Right ventricular systolic function is normal. There is mildly elevated pulmonary artery systolic pressure. The tricuspid regurgitant velocity is 3.12 m/s, and with an assumed right atrial pressure of 3 mmHg, the estimated right ventricular systolic pressure is 41.9 mmHg.  Left Atrium: Left atrial size was moderately dilated.  Right Atrium: Right atrial size was normal in size.  Pericardium: There is no evidence of pericardial effusion.  Mitral Valve: The mitral valve is normal in structure. Mild mitral valve regurgitation, with eccentric posteriorly directed jet. No evidence of mitral valve stenosis.  Tricuspid Valve: The tricuspid valve is normal in structure. Tricuspid valve regurgitation is mild . No evidence of tricuspid stenosis.  Aortic Valve: The aortic valve has an indeterminant number of cusps. Aortic valve regurgitation is mild. Moderate aortic stenosis is present. Aortic valve mean gradient measures 22.5 mmHg. Aortic valve peak gradient measures 42.5 mmHg.  Pulmonic Valve: The pulmonic valve was not well visualized. Pulmonic valve regurgitation is not visualized. No evidence of pulmonic stenosis.  Aorta: The aortic root is normal in size and structure.  Venous: The inferior vena cava is normal in size with greater than 50% respiratory variability, suggesting right atrial pressure of 3 mmHg.  IAS/Shunts: No atrial level shunt detected by color flow Doppler.  Additional Comments: 3D was performed not requiring image post processing on an independent workstation and was normal.   LEFT  VENTRICLE PLAX 2D LVIDd:         3.40 cm         Diastology LVIDs:         2.20 cm         LV e' medial:    5.87 cm/s LV PW:         1.30 cm         LV E/e' medial:  22.0 LV IVS:        2.20 cm         LV e' lateral:   10.10 cm/s LVOT diam:     2.00 cm         LV E/e' lateral: 12.8 LVOT Area:     3.14 cm  3D Volume EF LV 3D EF:    Left ventricul ar ejection fraction by 3D volume is 62 %. LV 3D EDV:   84.36 ml LV 3D ESV:   32.19 ml  3D Volume EF: 3D EF:        62 %  RIGHT VENTRICLE             IVC RV Basal diam:  2.70 cm     IVC diam: 1.50 cm RV S prime:     10.30 cm/s TAPSE (M-mode):  1.9 cm  LEFT ATRIUM             Index LA diam:        3.60 cm 1.94 cm/m LA Vol (A2C):   90.7 ml 48.88 ml/m LA Vol (A4C):   77.3 ml 41.66 ml/m LA Biplane Vol: 83.8 ml 45.16 ml/m AORTIC VALVE AV Vmax:      326.00 cm/s AV Vmean:     220.500 cm/s AV VTI:       0.634 m AV Peak Grad: 42.5 mmHg AV Mean Grad: 22.5 mmHg  AORTA Ao Root diam: 3.00 cm Ao Asc diam:  3.40 cm  MITRAL VALVE                TRICUSPID VALVE MV Area (PHT): 5.23 cm     TR Peak grad:   38.9 mmHg MV Decel Time: 145 msec     TR Vmax:        312.00 cm/s MV E velocity: 129.00 cm/s MV A velocity: 89.60 cm/s   SHUNTS MV E/A ratio:  1.44         Systemic Diam: 2.00 cm  Vishnu Priya Mallipeddi Electronically signed by Diannah Late Mallipeddi Signature Date/Time: 01/01/2024/2:32:52 PM    Final    MONITORS  LONG TERM MONITOR (3-14 DAYS) 11/17/2021  Narrative  Patient had a minimum heart rate of 37 bpm, maximum heart rate of 160 bpm, and average heart rate of 78 bpm.  Predominant underlying rhythm was sinus rhythm.  P-SVT 10 episodes lasting 10 beats at longest with a max rate of 160 bpm at fastest.  Isolated PACs were rare (<1.0%).  Isolated PVCs were rare (<1.0%).  Two nocturnal Wenckebach episodes noted.  No triggered and diary events.  No NSVT or AF, sent for  HCM.        ______________________________________________________________________________________________       Current Reported Medications:.    Current Meds  Medication Sig   acetaminophen  (TYLENOL ) 325 MG tablet Take 2 tablets (650 mg total) by mouth every 6 (six) hours as needed for mild pain, fever or headache (or Fever >/= 101).   alendronate (FOSAMAX) 70 MG tablet Take 70 mg by mouth every Wednesday.   allopurinol  (ZYLOPRIM ) 300 MG tablet Take 300 mg by mouth daily.   apixaban  (ELIQUIS ) 5 MG TABS tablet Take 1 tablet (5 mg total) by mouth 2 (two) times daily.   atorvastatin  (LIPITOR) 10 MG tablet Take 10 mg by mouth at bedtime.   Cholecalciferol  (VITAMIN D-3 PO) Take 1 tablet by mouth daily.   empagliflozin  (JARDIANCE ) 10 MG TABS tablet Take 1 tablet (10 mg total) by mouth daily.   ezetimibe  (ZETIA ) 10 MG tablet Take 10 mg by mouth daily.   ferrous sulfate  325 (65 FE) MG tablet Take 325 mg by mouth 2 (two) times daily.   fluticasone  (FLONASE ) 50 MCG/ACT nasal spray Place 1 spray into both nostrils daily as needed for allergies.   folic acid  (FOLVITE ) 1 MG tablet Take 1 mg by mouth daily.   furosemide  (LASIX ) 20 MG tablet Take 20 mg by mouth daily.   loratadine  (CLARITIN ) 10 MG tablet Take 10 mg by mouth daily.   methotrexate  2.5 MG tablet Take 20 mg by mouth every Wednesday.    metoprolol  tartrate (LOPRESSOR ) 25 MG tablet Take 1 tablet (25 mg total) by mouth 2 (two) times daily.   nitroGLYCERIN  (NITROSTAT ) 0.4 MG SL tablet Place 1 tablet (0.4 mg total) under the tongue every 5 (five) minutes as needed for  chest pain.   pantoprazole  (PROTONIX ) 40 MG tablet Take 1 tablet (40 mg total) by mouth daily before breakfast.   RESTASIS  0.05 % ophthalmic emulsion Place 1 drop into both eyes 2 (two) times daily.   sodium chloride  (OCEAN) 0.65 % nasal spray Place 1 spray into the nose every 2 (two) hours as needed for congestion (moisture).   spironolactone  (ALDACTONE ) 25 MG tablet Take 0.5 tablets  (12.5 mg total) by mouth daily.   sucralfate  (CARAFATE ) 1 g tablet Take 1 g by mouth 2 (two) times daily.    Physical Exam:    VS:  BP 120/72   Pulse 69   Ht 5' 4 (1.626 m)   Wt 170 lb 6.4 oz (77.3 kg)   SpO2 96%   BMI 29.25 kg/m    Wt Readings from Last 3 Encounters:  03/01/24 170 lb 6.4 oz (77.3 kg)  01/11/24 167 lb 12.8 oz (76.1 kg)  01/02/24 170 lb 3.1 oz (77.2 kg)    GEN: Well nourished, well developed in no acute distress NECK: No JVD; No carotid bruits CARDIAC: RRR, 3/6 systolic murmur, no rubs or gallops RESPIRATORY:  Clear to auscultation without rales, wheezing or rhonchi  ABDOMEN: Soft, non-tender, non-distended EXTREMITIES:  No edema; No acute deformity     Asessement and Plan:.    HOCM: Patient has known HOCM variant and has been followed by Dr. Santo.  Patient denies any recent chest pains or increased shortness of breath.  Patient has previously refused septal reduction therapy and cardiac myosin inhibitors.  Patient was admitted in 12/2023 with atrial fibrillation and HFpEF exacerbation.  Per notes her verapamil  was discontinued and she was started on Jardiance , spironolactone  and metoprolol .  Patient reports that she is tolerating all of her medications well, denies any chest pain or shortness of breath, denies any significant dizziness, lightness, presyncope or syncope.  Patient and her daughter notes that they want to continue these medications at this time, discussed increased risk given history of hokum.  Will discuss medications with Dr. Santo. Check CBC and BMET.  ADDENDUM 03/04/24: Dr. Santo aware of medication adjustments, no further changes at this time. If cardiac monitor shows atrial fibrillation may need to consider ablation.   CAD: She is s/p DES to OM1 in 2018 with 60% stenosis along first marginal bifurcation lesion jailed by initial stent and treated with PTCA. Most recent catheterization in 2024  showed a patent stent with mild  disease elsewhere.  Patient denies any recent chest pain at this time. Reviewed ED precautions. Check CBC and BMET.   Afib: Patient noted to be in atrial fibrillation during admission in 12/2023.  She was started on metoprolol  and Eliquis .  Patient denies new palpitations or feeling of irregular heartbeats.  She reports that she is tolerating medications well, she does note having a nose bleed last night denies any other significant bleeding problems on Eliquis .  Patient agreeable to 2-week cardiac monitor to assess A-fib burden and SVT.  Anemia/Hx of GI bleeding: Last hemoglobin 14.3 on 01/04/24. Check CBC.    Disposition: F/u with Dr. Santo in 2 months.   Signed, Allisha Harter D Erby Sanderson, NP

## 2024-03-01 ENCOUNTER — Ambulatory Visit: Attending: Cardiology | Admitting: Cardiology

## 2024-03-01 ENCOUNTER — Ambulatory Visit (INDEPENDENT_AMBULATORY_CARE_PROVIDER_SITE_OTHER)

## 2024-03-01 VITALS — BP 120/72 | HR 69 | Ht 64.0 in | Wt 170.4 lb

## 2024-03-01 DIAGNOSIS — I421 Obstructive hypertrophic cardiomyopathy: Secondary | ICD-10-CM | POA: Insufficient documentation

## 2024-03-01 DIAGNOSIS — I25118 Atherosclerotic heart disease of native coronary artery with other forms of angina pectoris: Secondary | ICD-10-CM | POA: Insufficient documentation

## 2024-03-01 DIAGNOSIS — I4891 Unspecified atrial fibrillation: Secondary | ICD-10-CM

## 2024-03-01 DIAGNOSIS — D500 Iron deficiency anemia secondary to blood loss (chronic): Secondary | ICD-10-CM | POA: Insufficient documentation

## 2024-03-01 DIAGNOSIS — I471 Supraventricular tachycardia, unspecified: Secondary | ICD-10-CM

## 2024-03-01 NOTE — Progress Notes (Unsigned)
Applied a 14 day Zio XT monitor to patient in the office Chandrasekhar to read

## 2024-03-01 NOTE — Patient Instructions (Addendum)
 Medication Instructions:   Your physician recommends that you continue on your current medications as directed. Please refer to the Current Medication list given to you today.   *If you need a refill on your cardiac medications before your next appointment, please call your pharmacy*   Lab Work:  PLEASE GO DOWN STAIRS  [facility]  FIRST FLOOR   ( GET OFF ELEVATORS WALK TOWARDS WAITING AREA LAB LOCATED BY PHARMACY): BMET AND CBC TODAY      If you have labs (blood work) drawn today and your tests are completely normal, you will receive your results only by: MyChart Message (if you have MyChart) OR A paper copy in the mail If you have any lab test that is abnormal or we need to change your treatment, we will call you to review the results.   Testing/Procedures:  Your physician has recommended that you wear an event monitor. Event monitors are medical devices that record the heart's electrical activity. Doctors most often us  these monitors to diagnose arrhythmias. Arrhythmias are problems with the speed or rhythm of the heartbeat. The monitor is a small, portable device. You can wear one while you do your normal daily activities. This is usually used to diagnose what is causing palpitations/syncope (passing out).      Follow-Up:  At [facility], you and your health needs are our priority.  As part of our continuing mission to provide you with exceptional heart care, our providers are all part of one team.  This team includes your primary Cardiologist (physician) and Advanced Practice Providers or APPs (Physician Assistants and Nurse Practitioners) who all work together to provide you with the care you need, when you need it.  Your next appointment:    2 month(s)   Provider:  Stanly DELENA Leavens, MD     We recommend signing up for the patient portal called MyChart.  Sign up information is provided on this After Visit Summary.  MyChart is used to connect with patients for  Virtual Visits (Telemedicine).  Patients are able to view lab/test results, encounter notes, upcoming appointments, etc.  Non-urgent messages can be sent to your provider as well.   To learn more about what you can do with MyChart, go to [email or web address].   Other Instructions  ZIO XT- Long Term Monitor Instructions  Your physician has requested you wear a ZIO patch monitor for 14 days.  This is a single patch monitor. [employer] supplies one patch monitor per enrollment. Additional stickers are not available. Please do not apply patch if you will be having a Nuclear Stress Test,  Echocardiogram, Cardiac CT, MRI, or Chest Xray during the period you would be wearing the  monitor. The patch cannot be worn during these tests. You cannot remove and re-apply the  ZIO XT patch monitor.  Your ZIO patch monitor will be mailed 3 day USPS to your address on file. It may take 3-5 days  to receive your monitor after you have been enrolled.  Once you have received your monitor, please review the enclosed instructions. Your monitor  has already been registered assigning a specific monitor serial # to you.  Billing and Patient Assistance Program Information  We have supplied [employer] with any of your insurance information on file for billing purposes. [employer] offers a sliding scale Patient Assistance Program for patients that do not have  insurance, or whose insurance does not completely cover the cost of the ZIO monitor.  You must apply for the Patient Assistance  Program to qualify for this discounted rate.  To apply, please call [employer] at [phone], select option 4, select option 2, ask to apply for  Patient Assistance Program. Meredeth will ask your household income, and how many people  are in your household. They will quote your out-of-pocket cost based on that information.  [employer] will also be able to set up a 64-month, interest-free payment plan if needed.  Applying the monitor   Shave  hair from upper left chest.  Hold abrader disc by orange tab. Rub abrader in 40 strokes over the upper left chest as  indicated in your monitor instructions.  Clean area with 4 enclosed alcohol pads. Let dry.  Apply patch as indicated in monitor instructions. Patch will be placed under collarbone on left  side of chest with arrow pointing upward.  Rub patch adhesive wings for 2 minutes. Remove white label marked 1. Remove the white  label marked 2. Rub patch adhesive wings for 2 additional minutes.  While looking in a mirror, press and release button in center of patch. A small green light will  flash 3-4 times. This will be your only indicator that the monitor has been turned on.  Do not shower for the first 24 hours. You may shower after the first 24 hours.  Press the button if you feel a symptom. You will hear a small click. Record Date, Time and  Symptom in the Patient Logbook.  When you are ready to remove the patch, follow instructions on the last 2 pages of Patient  Logbook. Stick patch monitor onto the last page of Patient Logbook.  Place Patient Logbook in the blue and white box. Use locking tab on box and tape box closed  securely. The blue and white box has prepaid postage on it. Please place it in the mailbox as  soon as possible. Your physician should have your test results approximately 7 days after the  monitor has been mailed back to [facility].  Call [facility] Customer Care at [phone] if you have questions regarding  your ZIO XT patch monitor. Call them immediately if you see an orange light blinking on your  monitor.  If your monitor falls off in less than 4 days, contact our Monitor department at [phone].  If your monitor becomes loose or falls off after 4 days call [employer] at [phone] for  suggestions on securing your monitor

## 2024-03-02 ENCOUNTER — Encounter: Payer: Self-pay | Admitting: Cardiology

## 2024-03-02 LAB — CBC
Hematocrit: 31.2 % — ABNORMAL LOW (ref 34.0–46.6)
Hemoglobin: 10.2 g/dL — ABNORMAL LOW (ref 11.1–15.9)
MCH: 34.7 pg — ABNORMAL HIGH (ref 26.6–33.0)
MCHC: 32.7 g/dL (ref 31.5–35.7)
MCV: 106 fL — ABNORMAL HIGH (ref 79–97)
NRBC: 1 % — ABNORMAL HIGH (ref 0–0)
Platelets: 159 x10E3/uL (ref 150–450)
RBC: 2.94 x10E6/uL — ABNORMAL LOW (ref 3.77–5.28)
RDW: 16.6 % — ABNORMAL HIGH (ref 11.7–15.4)
WBC: 5.8 x10E3/uL (ref 3.4–10.8)

## 2024-03-02 LAB — BASIC METABOLIC PANEL WITH GFR
BUN/Creatinine Ratio: 27 (ref 12–28)
BUN: 26 mg/dL (ref 8–27)
CO2: 22 mmol/L (ref 20–29)
Calcium: 8.6 mg/dL — ABNORMAL LOW (ref 8.7–10.3)
Chloride: 101 mmol/L (ref 96–106)
Creatinine, Ser: 0.98 mg/dL (ref 0.57–1.00)
Glucose: 86 mg/dL (ref 70–99)
Potassium: 4 mmol/L (ref 3.5–5.2)
Sodium: 139 mmol/L (ref 134–144)
eGFR: 58 mL/min/1.73 — ABNORMAL LOW (ref >59)

## 2024-03-04 ENCOUNTER — Ambulatory Visit: Payer: Self-pay | Admitting: Cardiology

## 2024-03-24 DIAGNOSIS — I4891 Unspecified atrial fibrillation: Secondary | ICD-10-CM | POA: Diagnosis not present

## 2024-03-24 DIAGNOSIS — I421 Obstructive hypertrophic cardiomyopathy: Secondary | ICD-10-CM

## 2024-04-27 ENCOUNTER — Emergency Department (HOSPITAL_COMMUNITY)
Admission: EM | Admit: 2024-04-27 | Discharge: 2024-04-28 | Disposition: A | Discharge status: Home / Self Care | Attending: Emergency Medicine | Admitting: Emergency Medicine

## 2024-04-27 ENCOUNTER — Other Ambulatory Visit: Payer: Self-pay

## 2024-04-27 ENCOUNTER — Encounter (HOSPITAL_COMMUNITY): Payer: Self-pay | Admitting: Emergency Medicine

## 2024-04-27 DIAGNOSIS — N390 Urinary tract infection, site not specified: Secondary | ICD-10-CM | POA: Insufficient documentation

## 2024-04-27 DIAGNOSIS — R300 Dysuria: Secondary | ICD-10-CM | POA: Diagnosis present

## 2024-04-27 NOTE — ED Triage Notes (Signed)
 Pt with c/o burning after urination.

## 2024-04-28 DIAGNOSIS — N390 Urinary tract infection, site not specified: Secondary | ICD-10-CM | POA: Diagnosis not present

## 2024-04-28 LAB — URINALYSIS, W/ REFLEX TO CULTURE (INFECTION SUSPECTED)
Bilirubin Urine: NEGATIVE
Glucose, UA: >=500 mg/dL — AB
Ketones, ur: NEGATIVE mg/dL
Nitrite: POSITIVE — AB
Protein, ur: 30 mg/dL — AB
RBC / HPF: >50 RBC/hpf (ref 0–5)
Specific Gravity, Urine: 1.015 (ref 1.005–1.030)
WBC, UA: >50 WBC/hpf (ref 0–5)
pH: 6 (ref 5.0–8.0)

## 2024-04-28 MED ORDER — FOSFOMYCIN TROMETHAMINE 3 G PO PACK
3.0000 g | PACK | Freq: Once | ORAL | Status: AC
  Administered 2024-04-28: 3 g via ORAL
  Filled 2024-04-28: qty 3

## 2024-04-28 NOTE — ED Provider Notes (Signed)
 [facility] AT [facility] Provider Note   CSN: [id number] Arrival date & time: 04/27/24  2305     Patient presents with: Dysuria   Dana [name] is a 83 y.o. female.   Presents to the emergency department for evaluation of dysuria.  Patient reports burning with urination which worsens at the end of the stream.  No fever, back pain, nausea or vomiting.  Has had urinary tract infections in the past, feels similar.       Prior to Admission medications   Medication Sig Start Date End Date Taking? Authorizing Provider  acetaminophen  (TYLENOL ) 325 MG tablet Take 2 tablets (650 mg total) by mouth every 6 (six) hours as needed for mild pain, fever or headache (or Fever >/= 101). 08/07/19   Pearlean Manus, MD  alendronate (FOSAMAX) 70 MG tablet Take 70 mg by mouth every Wednesday. 03/15/19   [provider]  allopurinol  (ZYLOPRIM ) 300 MG tablet Take 300 mg by mouth daily.    [provider]  apixaban  (ELIQUIS ) 5 MG TABS tablet Take 1 tablet (5 mg total) by mouth 2 (two) times daily. 01/02/24   Maree, Pratik D, DO  atorvastatin  (LIPITOR) 10 MG tablet Take 10 mg by mouth at bedtime.    [provider]  Cholecalciferol  (VITAMIN D-3 PO) Take 1 tablet by mouth daily.    [provider]  empagliflozin  (JARDIANCE ) 10 MG TABS tablet Take 1 tablet (10 mg total) by mouth daily. 01/03/24   Maree, Pratik D, DO  ezetimibe  (ZETIA ) 10 MG tablet Take 10 mg by mouth daily.    [provider]  ferrous sulfate  325 (65 FE) MG tablet Take 325 mg by mouth 2 (two) times daily.    [provider]  fluticasone  (FLONASE ) 50 MCG/ACT nasal spray Place 1 spray into both nostrils daily as needed for allergies. 06/08/23   [provider]  folic acid  (FOLVITE ) 1 MG tablet Take 1 mg by mouth daily. 07/26/19   [provider]  furosemide  (LASIX ) 20 MG tablet Take 20 mg by mouth daily.    [provider]  loratadine   (CLARITIN ) 10 MG tablet Take 10 mg by mouth daily.    [provider]  loratadine  (CLARITIN ) 10 MG tablet Take 1 tablet (10 mg total) by mouth daily. Patient not taking: Reported on 03/01/2024 01/02/24   Maree Bracken D, DO  methotrexate  2.5 MG tablet Take 20 mg by mouth every Wednesday.     [provider]  metoprolol  tartrate (LOPRESSOR ) 25 MG tablet Take 1 tablet (25 mg total) by mouth 2 (two) times daily. 01/02/24 03/02/24  Maree, Pratik D, DO  nitroGLYCERIN  (NITROSTAT ) 0.4 MG SL tablet Place 1 tablet (0.4 mg total) under the tongue every 5 (five) minutes as needed for chest pain. 07/27/23   Vicci Afton CROME, MD  pantoprazole  (PROTONIX ) 40 MG tablet Take 1 tablet (40 mg total) by mouth daily before breakfast. 07/27/23   Johnson, Clanford L, MD  RESTASIS  0.05 % ophthalmic emulsion Place 1 drop into both eyes 2 (two) times daily. 11/10/22   [provider]  sodium chloride  (OCEAN) 0.65 % nasal spray Place 1 spray into the nose every 2 (two) hours as needed for congestion (moisture).    [provider]  spironolactone  (ALDACTONE ) 25 MG tablet Take 0.5 tablets (12.5 mg total) by mouth daily. 01/03/24 03/03/24  Maree, Pratik D, DO  sucralfate  (CARAFATE ) 1 g tablet Take 1 g by mouth 2 (two) times daily. 08/01/19  [provider]    Allergies: Patient has no known allergies.    Review of Systems  Updated Vital Signs BP 108/76   Pulse 74   Temp 98.4 F (36.9 C) (Oral)   Resp 20   Ht 5' 4 (1.626 m)   Wt 78 kg   SpO2 96%   BMI 29.52 kg/m   Physical Exam Vitals and nursing note reviewed.  Constitutional:      General: She is not in acute distress.    Appearance: She is well-developed.  HENT:     Head: Normocephalic and atraumatic.     Mouth/Throat:     Mouth: Mucous membranes are moist.  Eyes:     General: Vision grossly intact. Gaze aligned appropriately.     Extraocular Movements: Extraocular movements intact.     Conjunctiva/sclera: Conjunctivae  normal.  Cardiovascular:     Rate and Rhythm: Normal rate and regular rhythm.     Pulses: Normal pulses.     Heart sounds: Normal heart sounds, S1 normal and S2 normal. No murmur heard.    No friction rub. No gallop.  Pulmonary:     Effort: Pulmonary effort is normal. No respiratory distress.     Breath sounds: Normal breath sounds.  Abdominal:     General: Bowel sounds are normal.     Palpations: Abdomen is soft.     Tenderness: There is no abdominal tenderness. There is no guarding or rebound.     Hernia: No hernia is present.  Musculoskeletal:        General: No swelling.     Cervical back: Full passive range of motion without pain, normal range of motion and neck supple. No spinous process tenderness or muscular tenderness. Normal range of motion.     Right lower leg: No edema.     Left lower leg: No edema.  Skin:    General: Skin is warm and dry.     Capillary Refill: Capillary refill takes less than 2 seconds.     Findings: No ecchymosis, erythema, rash or wound.  Neurological:     General: No focal deficit present.     Mental Status: She is alert and oriented to person, place, and time.     GCS: GCS eye subscore is 4. GCS verbal subscore is 5. GCS motor subscore is 6.     Cranial Nerves: Cranial nerves 2-12 are intact.     Sensory: Sensation is intact.     Motor: Motor function is intact.     Coordination: Coordination is intact.  Psychiatric:        Attention and Perception: Attention normal.        Mood and Affect: Mood normal.        Speech: Speech normal.        Behavior: Behavior normal.     (all labs ordered are listed, but only abnormal results are displayed) Labs Reviewed  URINALYSIS, W/ REFLEX TO CULTURE (INFECTION SUSPECTED) - Abnormal; Notable for the following components:      Result Value   APPearance CLOUDY (*)    Glucose, UA >=500 (*)    Hgb urine dipstick LARGE (*)    Protein, ur 30 (*)    Nitrite POSITIVE (*)    Leukocytes,Ua LARGE (*)     Bacteria, UA RARE (*)    All other components within normal limits  URINE CULTURE    EKG: None  Radiology: No results found.   Procedures   Medications Ordered in the  ED  fosfomycin (MONUROL ) packet 3 g (has no administration in time range)                                    Medical Decision Making Amount and/or Complexity of Data Reviewed Labs: ordered.  Risk Prescription drug management.   Dents with dysuria.  Patient appears well, vital signs unremarkable.  Abdominal exam benign.  No signs of systemic illness.  Urinalysis very consistent with urinary tract infection.     Final diagnoses:  Lower urinary tract infectious disease    ED Discharge Orders     None          Haze Lonni PARAS, MD 04/28/24 [phone]

## 2024-04-30 ENCOUNTER — Ambulatory Visit (HOSPITAL_COMMUNITY)
Admission: RE | Admit: 2024-04-30 | Discharge: 2024-04-30 | Disposition: A | Discharge status: Home / Self Care | Payer: Medicare Other | Source: Ambulatory Visit | Attending: Cardiology | Admitting: Cardiology

## 2024-04-30 DIAGNOSIS — I421 Obstructive hypertrophic cardiomyopathy: Secondary | ICD-10-CM | POA: Diagnosis not present

## 2024-04-30 DIAGNOSIS — I471 Supraventricular tachycardia, unspecified: Secondary | ICD-10-CM

## 2024-04-30 LAB — ECHOCARDIOGRAM COMPLETE
AV Mean grad: 19 mmHg
AV Peak grad: 34.8 mmHg
Ao pk vel: 2.95 m/s
Area-P 1/2: 2.91 cm2
MV M vel: 6.14 m/s
MV Peak grad: 150.6 mmHg
S' Lateral: 2.2 cm

## 2024-04-30 LAB — URINE CULTURE: Culture: >=100000 — AB

## 2024-05-01 ENCOUNTER — Ambulatory Visit: Payer: Self-pay | Admitting: Internal Medicine

## 2024-05-01 ENCOUNTER — Telehealth (HOSPITAL_BASED_OUTPATIENT_CLINIC_OR_DEPARTMENT_OTHER): Payer: Self-pay | Admitting: *Deleted

## 2024-05-01 NOTE — Telephone Encounter (Signed)
 Post ED Visit - Positive Culture Follow-up  Culture report reviewed by antimicrobial stewardship pharmacist: [facility] []  Rankin Dee, Pharm.D. []  Venetia Gully, Pharm.D., BCPS AQ-ID []  Garrel Crews, Pharm.D., BCPS []  Almarie Lunger, Pharm.D., BCPS []  [place], [address].D., BCPS, AAHIVP []  Rosaline Bihari, Pharm.D., BCPS, AAHIVP []  Vernell Meier, PharmD, BCPS []  Latanya Hint, PharmD, BCPS []  Donald Medley, PharmD, BCPS []  Rocky Bold, PharmD []  Dorothyann Alert, PharmD, BCPS []  Morene Babe, PharmD  [facility] []  Rosaline Edison, PharmD []  Romona Bliss, PharmD []  Dolphus Roller, PharmD []  Veva Seip, Rph []  Vernell Daunt) Leonce, PharmD []  Eva Allis, PharmD []  Rosaline Millet, PharmD []  Iantha Batch, PharmD []  Arvin Gauss, PharmD []  Wanda Hasting, PharmD []  Ronal Rav, PharmD []  Rocky Slade, PharmD []  Bard Jeans, PharmD   Positive urine culture Dana [name]  No treatment provided and no further patient follow-up is required at this time.Patient has improved no urinary symptoms   Dana [name] 05/01/2024, 11:58 AM

## 2024-05-01 NOTE — Progress Notes (Signed)
 ED Antimicrobial Stewardship Positive Culture Follow Up   Jenese Mischke is an 83 y.o. female who presented to [facility] on 04/27/2024 with a chief complaint of  Chief Complaint  Patient presents with   Dysuria   Recent Results (from the past 720 hours)  Urine Culture     Status: Abnormal   Collection Time: 04/28/24 12:08 AM   Specimen: Urine, Random  Result Value Ref Range Status   Specimen Description   Final    URINE, RANDOM Performed at [facility], [address] [place], [place] [address]    Special Requests   Final    NONE Reflexed from [id number] Performed at [facility], [address], [place], [place] [address]    Culture (A)  Final    >=100,000 COLONIES/mL ESCHERICHIA COLI >=100,000 COLONIES/mL ENTEROBACTER CLOACAE    Report Status 04/30/2024 FINAL  Final   Organism ID, Bacteria ESCHERICHIA COLI (A)  Final   Organism ID, Bacteria ENTEROBACTER CLOACAE (A)  Final      Susceptibility   Enterobacter cloacae - MIC*    CEFEPIME <=0.12 SENSITIVE Sensitive     ERTAPENEM <=0.12 SENSITIVE Sensitive     CIPROFLOXACIN  0.5 INTERMEDIATE Intermediate     GENTAMICIN <=1 SENSITIVE Sensitive     NITROFURANTOIN 64 INTERMEDIATE Intermediate     TRIMETH/SULFA <=20 SENSITIVE Sensitive     PIP/TAZO Value in next row Sensitive      <=4 SENSITIVEThis is a modified FDA-approved test that has been validated and its performance characteristics determined by the reporting laboratory.  This laboratory is certified under the Clinical Laboratory Improvement Amendments CLIA as qualified to perform high complexity clinical laboratory testing.    MEROPENEM Value in next row Sensitive      <=4 SENSITIVEThis is a modified FDA-approved test that has been validated and its performance characteristics determined by the reporting laboratory.  This laboratory is certified under the Clinical Laboratory Improvement Amendments CLIA as qualified to perform high complexity clinical laboratory testing.    * >=100,000  COLONIES/mL ENTEROBACTER CLOACAE   Escherichia coli - MIC*    AMPICILLIN Value in next row Sensitive      <=4 SENSITIVEThis is a modified FDA-approved test that has been validated and its performance characteristics determined by the reporting laboratory.  This laboratory is certified under the Clinical Laboratory Improvement Amendments CLIA as qualified to perform high complexity clinical laboratory testing.    CEFAZOLIN (URINE) Value in next row Sensitive      <=1 SENSITIVEThis is a modified FDA-approved test that has been validated and its performance characteristics determined by the reporting laboratory.  This laboratory is certified under the Clinical Laboratory Improvement Amendments CLIA as qualified to perform high complexity clinical laboratory testing.    CEFEPIME Value in next row Sensitive      <=1 SENSITIVEThis is a modified FDA-approved test that has been validated and its performance characteristics determined by the reporting laboratory.  This laboratory is certified under the Clinical Laboratory Improvement Amendments CLIA as qualified to perform high complexity clinical laboratory testing.    ERTAPENEM Value in next row Sensitive      <=1 SENSITIVEThis is a modified FDA-approved test that has been validated and its performance characteristics determined by the reporting laboratory.  This laboratory is certified under the Clinical Laboratory Improvement Amendments CLIA as qualified to perform high complexity clinical laboratory testing.    CEFTRIAXONE  Value in next row Sensitive      <=1 SENSITIVEThis is a modified FDA-approved test that has been  validated and its performance characteristics determined by the reporting laboratory.  This laboratory is certified under the Clinical Laboratory Improvement Amendments CLIA as qualified to perform high complexity clinical laboratory testing.    CIPROFLOXACIN  Value in next row Sensitive      <=1 SENSITIVEThis is a modified FDA-approved test  that has been validated and its performance characteristics determined by the reporting laboratory.  This laboratory is certified under the Clinical Laboratory Improvement Amendments CLIA as qualified to perform high complexity clinical laboratory testing.    GENTAMICIN Value in next row Sensitive      <=1 SENSITIVEThis is a modified FDA-approved test that has been validated and its performance characteristics determined by the reporting laboratory.  This laboratory is certified under the Clinical Laboratory Improvement Amendments CLIA as qualified to perform high complexity clinical laboratory testing.    NITROFURANTOIN Value in next row Sensitive      <=1 SENSITIVEThis is a modified FDA-approved test that has been validated and its performance characteristics determined by the reporting laboratory.  This laboratory is certified under the Clinical Laboratory Improvement Amendments CLIA as qualified to perform high complexity clinical laboratory testing.    TRIMETH/SULFA Value in next row Sensitive      <=1 SENSITIVEThis is a modified FDA-approved test that has been validated and its performance characteristics determined by the reporting laboratory.  This laboratory is certified under the Clinical Laboratory Improvement Amendments CLIA as qualified to perform high complexity clinical laboratory testing.    AMPICILLIN/SULBACTAM Value in next row Sensitive      <=1 SENSITIVEThis is a modified FDA-approved test that has been validated and its performance characteristics determined by the reporting laboratory.  This laboratory is certified under the Clinical Laboratory Improvement Amendments CLIA as qualified to perform high complexity clinical laboratory testing.    PIP/TAZO Value in next row Sensitive      <=4 SENSITIVEThis is a modified FDA-approved test that has been validated and its performance characteristics determined by the reporting laboratory.  This laboratory is certified under the Clinical  Laboratory Improvement Amendments CLIA as qualified to perform high complexity clinical laboratory testing.    MEROPENEM Value in next row Sensitive      <=4 SENSITIVEThis is a modified FDA-approved test that has been validated and its performance characteristics determined by the reporting laboratory.  This laboratory is certified under the Clinical Laboratory Improvement Amendments CLIA as qualified to perform high complexity clinical laboratory testing.    * >=100,000 COLONIES/mL ESCHERICHIA COLI   Assessment: Patient's symptoms and urinalysis results are consistent with UTI. Urine culture with E. coli and E. cloacae. Patient was given fosfomycin, which covers E. coli well but less reliably for E. cloacae.   Plan: Follow-up with patient to see if urinary symptoms persist. If symptoms have improved, no changes recommended. If symptoms are persistent, recommend prescribing Bactrim 1 double strength tablet BID x 3 days.   ED Provider: Dr. Tonia Maurilio LITTIE Wilhemena 05/01/2024, 9:18 AM Clinical Pharmacist Monday - Friday phone -  [phone] Saturday - Sunday phone - [phone]

## 2024-05-14 ENCOUNTER — Ambulatory Visit: Attending: Internal Medicine | Admitting: Internal Medicine

## 2024-05-14 VITALS — BP 105/73 | HR 81 | Ht 62.0 in | Wt 163.0 lb

## 2024-05-14 DIAGNOSIS — I25118 Atherosclerotic heart disease of native coronary artery with other forms of angina pectoris: Secondary | ICD-10-CM | POA: Diagnosis present

## 2024-05-14 DIAGNOSIS — I421 Obstructive hypertrophic cardiomyopathy: Secondary | ICD-10-CM | POA: Insufficient documentation

## 2024-05-14 DIAGNOSIS — I4891 Unspecified atrial fibrillation: Secondary | ICD-10-CM | POA: Insufficient documentation

## 2024-05-14 NOTE — Progress Notes (Signed)
 Cardiology Office Note:    Date:  05/14/2024   ID:  Dana [name], DOB March 28, 1941, MRN [id number]  PCP:  Dana Raina Elizabeth, NP   [facility] Providers Cardiologist:  Dana DELENA Leavens, MD     Referring MD: Dana [name]*   CC:  Follow up oHCM  History of Present Illness:    Dana [name] is a 83 y.o. female with a hx of CAD s/p PCI OM1 and HOCM with preserved LVEF and LVOT gradient of 100 mmHg.   2023: started AV nodal therapy with improvement-> now no symptoms, deferred genetic testing.  Was seen in [place] in [place] : once for chest pain and once for shortness of breath requiring increased diuretic. 2024: NSTEMI eval related to GI bleed  Interpretor service defer today.  Patient asks to use son and daugther.  [name] an 48 year old with a history of coronary artery disease post PCI to the OM1, hypertrophic obstructive cardiomyopathy, and a history of GI bleed and iron deficiency anemia, presents with mild symptoms despite maximized dose of highest tolerated avian nodal agent use. At the last visit, the patient was experiencing dyspnea on exertion and was found to be iron deficient. Despite receiving IV iron, the patient reports no significant change in symptoms.  The patient denies experiencing chest pain, shortness of breath, palpitations, dizziness, near syncope, or fatigue. However, she does report dizziness upon exertion. The patient's family has noted that she experiences chest pain when she forgets to take her medications.  The patient has several children, not all of whom have been tested for hypertrophic obstructive cardiomyopathy. In June 2024, the patient deferred genetic testing and cardiac MRI.  The patient also has a history of paroxysmal SVT and coronary artery disease with stable angina. She had a heart catheterization in 2024 that revealed patent stents. The patient's care for her history of GI bleed and iron deficiency anemia is being transferred  to her primary care nurse practitioner.  The patient is currently on atorvastatin , Zetia , Lasix  20mg , PRN nitro, and verapamil  360mg . The patient's family reports that she experiences chest pain when she does not take her Lasix , which she takes 20mg  of every other day.  Her pill packs were reviewed today.   Past Medical History:  Diagnosis Date   CAD (coronary artery disease)    a. s/p DES to OM1 in 02/2017 b. patent stent by cath in 11/2022   Chest pain    Essential hypertension    Gout    HOCM (hypertrophic obstructive cardiomyopathy) (HCC)    Hyperlipidemia    Rheumatoid arthritis (HCC)     Past Surgical History:  Procedure Laterality Date   BIOPSY  10/20/2022   Procedure: BIOPSY;  Surgeon: Dana Iha, MD;  Location: [facility] ENDOSCOPY;  Service: Gastroenterology;;   CHOLECYSTECTOMY N/A 08/17/2018   Procedure: LAPAROSCOPIC CHOLECYSTECTOMY;  Surgeon: Dana Anes, MD;  Location: [facility] ORS;  Service: General;  Laterality: N/A;   COLONOSCOPY WITH PROPOFOL  N/A 10/20/2022   Procedure: COLONOSCOPY WITH PROPOFOL ;  Surgeon: Dana Iha, MD;  Location: [facility] ENDOSCOPY;  Service: Gastroenterology;  Laterality: N/A;   CORONARY BALLOON ANGIOPLASTY N/A 03/03/2017   Procedure: CORONARY BALLOON ANGIOPLASTY;  Surgeon: Dana Alm ORN, MD;  Location: [facility] INVASIVE CV LAB;  Service: Cardiovascular;  Laterality: N/A;   CORONARY STENT INTERVENTION N/A 03/03/2017   Procedure: CORONARY STENT INTERVENTION;  Surgeon: Dana Alm ORN, MD;  Location: [facility] INVASIVE CV LAB;  Service: Cardiovascular;  Laterality: N/A;   ESOPHAGOGASTRODUODENOSCOPY (EGD) WITH PROPOFOL  N/A 10/20/2022  Procedure: ESOPHAGOGASTRODUODENOSCOPY (EGD) WITH PROPOFOL ;  Surgeon: Dana Iha, MD;  Location: [facility] ENDOSCOPY;  Service: Gastroenterology;  Laterality: N/A;   LEFT HEART CATH AND CORONARY ANGIOGRAPHY N/A 03/02/2017   Procedure: LEFT HEART CATH AND CORONARY ANGIOGRAPHY;  Surgeon: Dana Sharper, MD;  Location: [facility] INVASIVE CV LAB;   Service: Cardiovascular;  Laterality: N/A;   LEFT HEART CATH AND CORONARY ANGIOGRAPHY N/A 12/08/2022   Procedure: LEFT HEART CATH AND CORONARY ANGIOGRAPHY;  Surgeon: Dana Lurena POUR, MD;  Location: [facility] INVASIVE CV LAB;  Service: Cardiovascular;  Laterality: N/A;   POLYPECTOMY  10/20/2022   Procedure: POLYPECTOMY;  Surgeon: Dana Iha, MD;  Location: [facility] ENDOSCOPY;  Service: Gastroenterology;;    Current Medications: Current Meds  Medication Sig   acetaminophen  (TYLENOL ) 325 MG tablet Take 2 tablets (650 mg total) by mouth every 6 (six) hours as needed for mild pain, fever or headache (or Fever >/= 101).   alendronate (FOSAMAX) 70 MG tablet Take 70 mg by mouth every Wednesday.   allopurinol  (ZYLOPRIM ) 300 MG tablet Take 300 mg by mouth daily.   apixaban  (ELIQUIS ) 5 MG TABS tablet Take 1 tablet (5 mg total) by mouth 2 (two) times daily.   atorvastatin  (LIPITOR) 10 MG tablet Take 10 mg by mouth at bedtime.   Cholecalciferol  (VITAMIN D-3 PO) Take 1 tablet by mouth daily.   empagliflozin  (JARDIANCE ) 10 MG TABS tablet Take 1 tablet (10 mg total) by mouth daily.   ezetimibe  (ZETIA ) 10 MG tablet Take 10 mg by mouth daily.   ferrous sulfate  325 (65 FE) MG tablet Take 325 mg by mouth 2 (two) times daily.   fluticasone  (FLONASE ) 50 MCG/ACT nasal spray Place 1 spray into both nostrils daily as needed for allergies.   folic acid  (FOLVITE ) 1 MG tablet Take 1 mg by mouth daily.   furosemide  (LASIX ) 20 MG tablet Take 20 mg by mouth daily.   loratadine  (CLARITIN ) 10 MG tablet Take 10 mg by mouth daily.   loratadine  (CLARITIN ) 10 MG tablet Take 1 tablet (10 mg total) by mouth daily.   methotrexate  2.5 MG tablet Take 20 mg by mouth every Wednesday.    metoprolol  tartrate (LOPRESSOR ) 25 MG tablet Take 1 tablet (25 mg total) by mouth 2 (two) times daily.   nitroGLYCERIN  (NITROSTAT ) 0.4 MG SL tablet Place 1 tablet (0.4 mg total) under the tongue every 5 (five) minutes as needed for chest pain.    pantoprazole  (PROTONIX ) 40 MG tablet Take 1 tablet (40 mg total) by mouth daily before breakfast.   RESTASIS  0.05 % ophthalmic emulsion Place 1 drop into both eyes 2 (two) times daily.   sodium chloride  (OCEAN) 0.65 % nasal spray Place 1 spray into the nose every 2 (two) hours as needed for congestion (moisture).   spironolactone  (ALDACTONE ) 25 MG tablet Take 0.5 tablets (12.5 mg total) by mouth daily.   sucralfate  (CARAFATE ) 1 g tablet Take 1 g by mouth 2 (two) times daily.     Allergies:   Patient has no known allergies.   Social History   Socioeconomic History   Marital status: Widowed    Spouse name: Not on file   Number of children: Not on file   Years of education: Not on file   Highest education level: Not on file  Occupational History   Not on file  Tobacco Use   Smoking status: Never   Smokeless tobacco: Never  Vaping Use   Vaping status: Never Used  Substance and Sexual Activity   Alcohol use:  No   Drug use: No   Sexual activity: Not Currently  Other Topics Concern   Not on file  Social History Narrative   Not on file   Social Drivers of Health   Financial Resource Strain: Not on file  Food Insecurity: No Food Insecurity (01/01/2024)   Hunger Vital Sign    Worried About Running Out of Food in the Last Year: Never true    Ran Out of Food in the Last Year: Never true  Transportation Needs: No Transportation Needs (01/01/2024)   PRAPARE - Administrator, Civil Service (Medical): No    Lack of Transportation (Non-Medical): No  Physical Activity: Not on file  Stress: Not on file  Social Connections: Moderately Integrated (01/01/2024)   Social Connection and Isolation Panel    Frequency of Communication with Friends and Family: Twice a week    Frequency of Social Gatherings with Friends and Family: Twice a week    Attends Religious Services: More than 4 times per year    Active Member of [employer] or Organizations: Yes    Attends Banker Meetings:  More than 4 times per year    Marital Status: Widowed    Social: One of her kids died from leukemia not HCM One Daughter (([place]- EMT) and four sons, one whom has moved here)  Family History: The patient's family history includes Alcohol abuse in her father; Hypertension in her mother; Liver disease in her mother.  ROS:   Please see the history of present illness.     EKGs/Labs/Other Studies Reviewed:    The following studies were reviewed today:   12/06/22: SR with LVH and secondary repol, anterior infarct pattern  Cardiac Studies & Procedures   ______________________________________________________________________________________________ CARDIAC CATHETERIZATION  CARDIAC CATHETERIZATION 03/02/2017  Conclusion 1. Severe single-vessel coronary artery disease involving a large first obtuse marginal branch of the left dominant circumflex 2. Moderate LAD stenosis 3. Small nondominant RCA with no significant obstruction 4. Mild to moderate left main stenosis 5. Known hypertrophic cardiomyopathy  Recommend PCI of the obtuse marginal. 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 The patient will be loaded with Plavix . Will schedule her PCI for tomorrow. Would use femoral artery access and consider either a 5 French guide catheter or a guide catheter with sideholes. Plan discussed at length with the patient's daughter. Note the patient is non-English speaking.  Findings Coronary Findings Diagnostic  Dominance: Left  Left Main  Left Anterior Descending  Left Circumflex  First Obtuse Marginal Branch  Lateral First Obtuse Marginal Branch  Right Coronary Artery There is mild the vessel.  Intervention  No interventions have been documented.   CARDIAC CATHETERIZATION  CARDIAC CATHETERIZATION 12/08/2022  Conclusion   Ost LM lesion is 20% stenosed.   1st Mrg lesion is 10% stenosed.   Mid LAD lesion is 30% stenosed.  1.   Patent obtuse marginal stent with mild diffuse disease elsewhere. 2.  LVEDP of 21 mmHg. 3.  Upper extremity tortuosity requiring destination sheath for catheter engagement.  Recommendation: Medical therapy.  Findings Coronary Findings Diagnostic  Dominance: Left  Left Main Ost LM lesion is 20% stenosed.  Left Anterior Descending There is mild diffuse disease throughout the vessel. Mid LAD lesion is 30% stenosed.  Left Circumflex There is mild diffuse disease throughout the vessel.  First Obtuse Marginal Branch 1st Mrg lesion is 10% stenosed. The lesion was previously treated .  Right Coronary Artery  The vessel exhibits minimal luminal irregularities.  Intervention  No interventions have been documented.   STRESS TESTS  NM PET CT CARDIAC PERFUSION MULTI W/ABSOLUTE BLOODFLOW 05/10/2022  Narrative   Findings are consistent with no prior ischemia and no prior myocardial infarction. The study is intermediate risk due to the presence of decreased global myocardial blood flow reserve which may indicate the presence of microvascular disease, however, cannot rule out underlying obstructive CAD given the presence of extensive coronary calcification. Could consider cath for further evaluation if clinically indicated.   LV perfusion is normal.   Rest left ventricular function is normal. Rest EF: 55 %. Stress left ventricular function is normal. Stress EF: 60 %. End diastolic cavity size is normal. End systolic cavity size is normal.   Myocardial blood flow was computed to be 0.76ml/g/min at rest and 1.61ml/g/min at stress. Global myocardial blood flow reserve was 1.29 and was abnormal.   Coronary calcium  was present on the attenuation correction CT images. Severe coronary calcifications were present. Coronary calcifications were present in the left anterior descending artery, left circumflex artery and right coronary artery distribution(s).  CLINICAL DATA:  This over-read does not  include interpretation of cardiac or coronary anatomy or pathology. The cardiac PET CT evaluation interpretation by the cardiologist is attached.  COMPARISON:  August 01, 2021  FINDINGS: Vascular: Please see dedicated report for cardiovascular details. There is aortic atherosclerosis and coronary artery calcification.  Mediastinum/Nodes:  Patulous esophagus. Mediastinal lymphadenopathy.  (Image 13/2) 17 mm RIGHT paratracheal lymph node may measured as much as 15-16 mm in 2018. Top-normal lymph node in the AP window (image 10/2) 14 mm. 10 mm in 2023 and new since 2018.  Increase in number of smaller lymph nodes in the pre-vascular space and upper AP window. No gross hilar adenopathy.  Lungs/Pleura: Patchy basilar atelectasis, no signs of effusion or consolidation.  Upper Abdomen: No acute upper abdominal process to the extent evaluated.  Musculoskeletal: No acute or destructive bone findings.  IMPRESSION: 1. Mediastinal nodal enlargement along the RIGHT paratracheal chain similar to February of 2023 and little changed compared to the study of August of 2018. New top-normal AP window lymph node size in generalized increase in number of lymph nodes in the chest. This finding can be seen in the setting of heart failure and should be correlated clinically. Given size of lymph nodes could consider 3 to six-month follow-up to ensure stability. 2. Aortic atherosclerosis and coronary artery calcification.  Aortic Atherosclerosis (ICD10-I70.0).   Electronically Signed By: Isla Blind M.D. On: 05/10/2022 11:17   ECHOCARDIOGRAM  ECHOCARDIOGRAM COMPLETE 04/30/2024  Narrative ECHOCARDIOGRAM REPORT    Patient Name:   Dana [name]  Date of Exam: 04/30/2024 Medical Rec #:  [id number]     Height:       64.0 in Accession #:    [id number]    Weight:       172.0 lb Date of Birth:  07-18-1940     BSA:          1.835 m Patient Age:    83 years      BP:           120/72  mmHg Patient Gender: F             HR:           61 bpm. Exam Location:  [address]  Procedure: 2D Echo, 3D Echo, Cardiac Doppler and Color Doppler (Both Spectral and Color Flow Doppler were utilized during procedure).  Indications:    I42.1 HOCM  History:        Patient has prior history of Echocardiogram examinations, most recent 01/01/2024. CAD and Previous Myocardial Infarction, STENT., Signs/Symptoms:Chest Pain; Risk Factors:Hypertension and Dyslipidemia. HOCM.  Sonographer:    Carl Coma RDCS Referring Phys: [id number] [facility] Kaidin Boehle  IMPRESSIONS   1. There is near cavity obliteration in the mid LV cavity during systole. Significant turbulence in the LVOT by colorflow doppler due to severe chordal SAM/mid LV cavity obliteration. Unable to calculate accurate LVOT and AV gradients as it is contaminated by MR. . Left ventricular ejection fraction, by estimation, is 60 to 65%. Left ventricular ejection fraction by 3D volume is 60 %. The left ventricle has normal function. The left ventricle has no regional wall motion abnormalities. There is severe left ventricular hypertrophy of the basal-septal segment. Left ventricular diastolic parameters are consistent with Grade II diastolic dysfunction (pseudonormalization). Elevated left atrial pressure. 2. Right ventricular systolic function is normal. The right ventricular size is normal. 3. Left atrial size was moderately dilated. 4. The mitral valve is normal in structure. 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 No evidence of mitral stenosis. 5. The aortic valve is calcified. There is mild calcification of the aortic valve. There is mild thickening of the aortic valve. Aortic valve regurgitation is not visualized. Mild aortic valve stenosis. Aortic valve mean gradient measures 19.0 mmHg. Aortic valve Vmax measures 2.95 m/s. 6. The inferior vena cava is normal in size with greater than 50% respiratory  variability, suggesting right atrial pressure of 3 mmHg.  FINDINGS Left Ventricle: There is near cavity obliteration in the mid LV cavity during systole. Increased turbulence in the LVOT by colorflow doppler due to severe chordal SAM/mid LV cavity obliteration. Unable to calculate accurate LVOT and AV gradients as it is contaminated by MR. Left ventricular ejection fraction, by estimation, is 60 to 65%. Left ventricular ejection fraction by 3D volume is 60 %. The left ventricle has normal function. The left ventricle has no regional wall motion abnormalities. The left ventricular internal cavity size was normal in size. There is severe left ventricular hypertrophy of the basal-septal segment. Left ventricular diastolic parameters are consistent with Grade II diastolic dysfunction (pseudonormalization). Elevated left atrial pressure.  Right Ventricle: The right ventricular size is normal. No increase in right ventricular wall thickness. Right ventricular systolic function is normal.  Left Atrium: Left atrial size was moderately dilated.  Right Atrium: Right atrial size was normal in size.  Pericardium: There is no evidence of pericardial effusion.  Mitral Valve: The mitral valve is normal in structure. Mild mitral valve regurgitation. No evidence of mitral valve stenosis.  Tricuspid Valve: The tricuspid valve is normal in structure. Tricuspid valve regurgitation is trivial. No evidence of tricuspid stenosis.  The aortic valve is calcified. There is mild calcification of the aortic valve. There is mild thickening of the aortic valve. Aortic valve regurgitation is not visualized. Mild aortic stenosis is present. Pulmonic Valve: The pulmonic valve was normal in structure. Pulmonic valve regurgitation is not visualized. No evidence of pulmonic stenosis.  Aorta: The aortic root is normal in size and structure.  Venous: The inferior vena cava is normal in size with greater than 50% respiratory  variability, suggesting right atrial pressure of 3 mmHg.  IAS/Shunts: No atrial level shunt detected by color flow Doppler.  Additional Comments: 3D was performed not requiring image post processing on an independent workstation and was normal.   LEFT VENTRICLE PLAX 2D  LVIDd:         3.00 cm         Diastology LVIDs:         2.20 cm         LV e' medial:    4.47 cm/s LV PW:         1.50 cm         LV E/e' medial:  20.7 LV IVS:        2.10 cm         LV e' lateral:   7.01 cm/s LVOT diam:     1.80 cm         LV E/e' lateral: 13.2 LVOT Area:     2.54 cm  3D Volume EF LV 3D EF:    Left ventricul ar ejection fraction by 3D volume is 60 %.  3D Volume EF: 3D EF:        60 % LV EDV:       183 ml LV ESV:       74 ml LV SV:        109 ml  RIGHT VENTRICLE             IVC RV Basal diam:  3.70 cm     IVC diam: 1.40 cm RV S prime:     11.60 cm/s TAPSE (M-mode): 2.1 cm  LEFT ATRIUM             Index        RIGHT ATRIUM           Index LA diam:        5.10 cm 2.78 cm/m   RA Area:     12.10 cm LA Vol (A2C):   82.2 ml 44.80 ml/m  RA Volume:   29.80 ml  16.24 ml/m LA Vol (A4C):   68.3 ml 37.23 ml/m LA Biplane Vol: 75.9 ml 41.37 ml/m AORTIC VALVE AV Vmax:      295.00 cm/s AV Vmean:     201.500 cm/s AV VTI:       0.678 m AV Peak Grad: 34.8 mmHg AV Mean Grad: 19.0 mmHg  AORTA Ao Root diam: 2.80 cm Ao Asc diam:  3.70 cm  MITRAL VALVE               TRICUSPID VALVE MV Area (PHT): 2.91 cm    TR Peak grad:   38.4 mmHg MV Decel Time: 261 msec    TR Vmax:        310.00 cm/s MR Peak grad: 150.6 mmHg MR Vmax:      613.67 cm/s  SHUNTS MV E velocity: 92.75 cm/s  Systemic Diam: 1.80 cm MV A velocity: 80.45 cm/s MV E/A ratio:  1.15  Wilbert Bihari MD Electronically signed by Wilbert Bihari MD Signature Date/Time: 04/30/2024/7:19:55 PM    Final    MONITORS  LONG TERM MONITOR (3-14 DAYS) 03/20/2024  Narrative   Patient had a minimum heart rate of 50 bpm, maximum heart rate of  148 bpm, and average heart rate of 71 bpm. Predominant underlying rhythm was sinus rhythm. Frequent SVT, lasting 18 beats at longest. Isolated PACs were rare (<1.0%). Isolated PVCs were rare (<1.0%). No triggered and diary events.  Asymptomatic SVT.  In the setting of HCM, no AF or NSVT.       ______________________________________________________________________________________________        Recent Labs: 01/01/2024: B Natriuretic Peptide 595.0; TSH 2.694 01/02/2024: Magnesium 2.1 01/04/2024: ALT 12 03/01/2024: BUN 26;  Creatinine, Ser 0.98; Hemoglobin 10.2; Platelets 159; Potassium 4.0; Sodium 139  Recent Lipid Panel    Component Value Date/Time   CHOL 84 10/21/2022 0300   TRIG 65 10/21/2022 0300   HDL 27 (L) 10/21/2022 0300   CHOLHDL 3.1 10/21/2022 0300   VLDL 13 10/21/2022 0300   LDLCALC 44 10/21/2022 0300    Physical Exam:    VS:  BP 105/73   Pulse 81   Ht 5' 2 (1.575 m)   Wt 163 lb (73.9 kg)   SpO2 95%   BMI 29.81 kg/m     Wt Readings from Last 3 Encounters:  05/14/24 163 lb (73.9 kg)  04/27/24 171 lb 15.3 oz (78 kg)  03/01/24 170 lb 6.4 oz (77.3 kg)    Gen: no distress, elderly female   Neck: No JV Cardiac: No Rubs or Gallops, harsh systolic murmur worse with hand grip, RRR and +2 radial pulses Respiratory: Clear to auscultation bilaterally, normal effort, normal  respiratory rate GI: Soft, nontender, non-distended  MS: no edema;  moves all extremities Integument: Skin feels ok Neuro:  At time of evaluation, alert and oriented to person/place/time/situation  Psych: Normal affect, patient feels well   ASSESSMENT/PLAN:    Obstructive Hypertrophic Cardiomyopathy (HCM) 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 Asymptomatic with no chest pain, dyspnea, or syncope. Significant weight loss and improved cholesterol and blood pressure. Potential future need for open heart surgery or complex procedures such as alcohol septal ablation and valve placement if symptoms develop. Genetic testing for sarcomeric hypertrophic cardiomyopathy considered for family screening. - Continue current management as asymptomatic. - Monitor for symptoms such as fluid retention; if present, increase furosemide  dosage.  She now is compliant with lasix  20 mg PO daily - Will schedule follow-up in six months to reassess condition.  Paroxysmal Supraventricular Tachycardia (SVT) PAF CHASDVASC NA - Paroxysmal SVT. On DOAC with history of IDA and GI bleed but tolerating therapy well.  Would avoid LAA-O if possible given risks in the HCM population; do recommend ablation and EP referral  Coronary Artery Disease (CAD) with Stable Angina - With HLD and aortic atherosclerosis  - Continue atorvastatin  and Zetia  - LDL goal < 70  GENETIC TESTING COUNSELING EVALUATION:  I met with the patient today to discuss cardiac genetic testing. She deferred an interpretor in lieur of her family at beside. We reviewed the following information:  INDICATION FOR TESTING: * Confirmed diagnosis of oHCM  TESTING OPTION(S) DISCUSSED: * Familial variant testing  BENEFITS OF GENETIC TESTING DISCUSSED:  * May confirm or refine clinical diagnosis * May identify at-risk family members who could benefit from screening (she has six living children; two who live in [place]; one in [place], one in [place]) * May avoid unnecessary testing or interventions for family members who test negative (none have received echo screening  LIMITATIONS AND RISKS DISCUSSED:  * Testing may not identify a genetic cause, even if one exists * Testing may identify a variant of uncertain significance (VUS) * Results may change over time as  more information becomes available * Potential for unexpected or secondary findings * Possible psychological impact of positive or negative results * Possible discrimination concerns  FINANCIAL CONSIDERATIONS DISCUSSED:  * [employer]  will bill insurance and reach out to the patient if over $100.   LIFE INSURANCE/DISABILITY INSURANCE IMPLICATIONS:  * GINA protection applies to health insurance and employment but not life, disability, or long-term care insurance * Documentation considerations for insurance applications  FAMILY IMPLICATIONS: * Cascade testing approach for relatives if positive result * Communication strategies with family members (this may be difficult for the one who lives in [place]) * Resources for family discussions  PATIENT UNDERSTANDING AND DECISION:  * Patient demonstrated understanding of the above information * Patient had opportunity to ask questions which were addressed * Patient decision: Proceed with testing  TESTING PERFORMED ([facility])  FOLLOW-UP PLAN:  * Results disclosure plan:  Gene Elusive- Will send results electronically and offer f/u based on patients preference Sarcomeric HCM- f/u with patient's family.  Time:   I have spent a total of 60 minutes with the patient reviewing notes, imaging, EKGs, labs,  and examining the patient as well as establishing an assessment and plan that was discussed personally with the patient. Discussed disease state education , using shared decision making tools and cardiac modeling. Reviewed care and plan in collaboration with genetics team.    Dana Leavens, MD FASE [facility] Cardiologist [facility]  [address] [place], [address] [place], [place] [address] [phone]  12:30 PM

## 2024-05-14 NOTE — Patient Instructions (Addendum)
 Medication Instructions:  Your physician recommends that you continue on your current medications as directed. Please refer to the Current Medication list given to you today.  *If you need a refill on your cardiac medications before your next appointment, please call your pharmacy*    Follow-Up: At [facility], you and your health needs are our priority.  As part of our continuing mission to provide you with exceptional heart care, our providers are all part of one team.  This team includes your primary Cardiologist (physician) and Advanced Practice Providers or APPs (Physician Assistants and Nurse Practitioners) who all work together to provide you with the care you need, when you need it.  Your next appointment:   6 months   Provider:   Stanly DELENA Leavens, MD  or Orren Fabry, [place]

## 2024-06-14 ENCOUNTER — Telehealth: Payer: Self-pay | Admitting: Internal Medicine

## 2024-06-14 NOTE — Telephone Encounter (Signed)
 Called to say there are information that messing in order to do the testing. Please advise

## 2024-06-14 NOTE — Telephone Encounter (Signed)
 Delon returned call. She states the reason for order is a familial variant but the variant in question was not provided Please advise.

## 2024-06-14 NOTE — Telephone Encounter (Signed)
 Left message with call back number.

## 2024-06-19 LAB — HELIX HYPERTROPHIC CARDIOMYOPATHY PANEL: Genetic Analysis Overall Interpretation: NEGATIVE

## 2024-06-23 ENCOUNTER — Ambulatory Visit: Payer: Self-pay | Admitting: Internal Medicine

## 2024-07-04 ENCOUNTER — Inpatient Hospital Stay: Attending: Hematology

## 2024-07-04 DIAGNOSIS — D649 Anemia, unspecified: Secondary | ICD-10-CM

## 2024-07-04 DIAGNOSIS — D500 Iron deficiency anemia secondary to blood loss (chronic): Secondary | ICD-10-CM

## 2024-07-04 DIAGNOSIS — D72819 Decreased white blood cell count, unspecified: Secondary | ICD-10-CM

## 2024-07-04 LAB — COMPREHENSIVE METABOLIC PANEL WITH GFR
ALT: 10 U/L (ref 0–44)
AST: 28 U/L (ref 15–41)
Albumin: 3.8 g/dL (ref 3.5–5.0)
Alkaline Phosphatase: 76 U/L (ref 38–126)
Anion gap: 13 (ref 5–15)
BUN: 10 mg/dL (ref 8–23)
CO2: 24 mmol/L (ref 22–32)
Calcium: 9.1 mg/dL (ref 8.9–10.3)
Chloride: 104 mmol/L (ref 98–111)
Creatinine, Ser: 0.91 mg/dL (ref 0.44–1.00)
GFR, Estimated: >60 mL/min
Glucose, Bld: 86 mg/dL (ref 70–99)
Potassium: 4.2 mmol/L (ref 3.5–5.1)
Sodium: 141 mmol/L (ref 135–145)
Total Bilirubin: 0.5 mg/dL (ref 0.0–1.2)
Total Protein: 7.2 g/dL (ref 6.5–8.1)

## 2024-07-04 LAB — CBC WITH DIFFERENTIAL/PLATELET
Abs Immature Granulocytes: 0.02 K/uL (ref 0.00–0.07)
Basophils Absolute: 0.1 K/uL (ref 0.0–0.1)
Basophils Relative: 1 %
Eosinophils Absolute: 0.1 K/uL (ref 0.0–0.5)
Eosinophils Relative: 2 %
HCT: 34 % — ABNORMAL LOW (ref 36.0–46.0)
Hemoglobin: 11 g/dL — ABNORMAL LOW (ref 12.0–15.0)
Immature Granulocytes: 1 %
Lymphocytes Relative: 18 %
Lymphs Abs: 0.8 K/uL (ref 0.7–4.0)
MCH: 34.1 pg — ABNORMAL HIGH (ref 26.0–34.0)
MCHC: 32.4 g/dL (ref 30.0–36.0)
MCV: 105.3 fL — ABNORMAL HIGH (ref 80.0–100.0)
Monocytes Absolute: 0.6 K/uL (ref 0.1–1.0)
Monocytes Relative: 13 %
Neutro Abs: 2.9 K/uL (ref 1.7–7.7)
Neutrophils Relative %: 65 %
Platelets: 152 K/uL (ref 150–400)
RBC: 3.23 MIL/uL — ABNORMAL LOW (ref 3.87–5.11)
RDW: 19 % — ABNORMAL HIGH (ref 11.5–15.5)
WBC: 4.4 K/uL (ref 4.0–10.5)
nRBC: 0 % (ref 0.0–0.2)

## 2024-07-04 LAB — IRON AND TIBC
Iron: 31 ug/dL (ref 28–170)
Saturation Ratios: 12 % (ref 10.4–31.8)
TIBC: 262 ug/dL (ref 250–450)
UIBC: 231 ug/dL

## 2024-07-04 LAB — FERRITIN: Ferritin: 378 ng/mL — ABNORMAL HIGH (ref 11–307)

## 2024-07-07 LAB — METHYLMALONIC ACID, SERUM: Methylmalonic Acid, Quantitative: 282 nmol/L (ref 0–378)

## 2024-07-11 ENCOUNTER — Inpatient Hospital Stay: Admitting: Oncology

## 2024-07-11 ENCOUNTER — Other Ambulatory Visit (HOSPITAL_COMMUNITY): Payer: Self-pay | Admitting: Adult Health

## 2024-07-11 DIAGNOSIS — D72819 Decreased white blood cell count, unspecified: Secondary | ICD-10-CM

## 2024-07-11 DIAGNOSIS — D500 Iron deficiency anemia secondary to blood loss (chronic): Secondary | ICD-10-CM

## 2024-07-11 DIAGNOSIS — Z1231 Encounter for screening mammogram for malignant neoplasm of breast: Secondary | ICD-10-CM

## 2024-07-11 NOTE — Progress Notes (Signed)
 "  [facility] OFFICE PROGRESS NOTE  Nsumanganyi, Dana Elizabeth, NP  ASSESSMENT & PLAN:    Assessment & Plan Iron deficiency anemia due to chronic blood loss - Etiology felt to be due to chronic blood loss from nosebleeds. -Discussed patient following up with ENT given that this appears to be happening more frequently.  -Most recent labs 07/04/24 show ferritin 378, iron saturation 12%, and TIBC 262.  Hemoglobin has declined to 11.0 from 14.3 6 months ago. -She received 2 doses of IV Feraheme on 07/17/2023 and 07/24/2023. -Recommend 2 additional doses of IV Feraheme. -Continue iron tablet daily as prescribed by PCP -Return to clinic in 3 months with lab only and in 6 months with lab and CMP. Leukopenia, unspecified type - Labs from 07/04/2024 showed WBC of 4.4. 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 - If she develops any significant neutropenia or other cytopenias in the future, we will speak with Dr. Curt (rheumatology in [place]) and see if we can decrease the dose of her methotrexate .  Orders Placed This Encounter  Procedures   Ferritin    Standing Status:   Future    Expected Date:   10/09/2024    Expiration Date:   01/07/2025   Iron and TIBC ([facility] ONLY)    Standing Status:   Future    Expected Date:   10/09/2024    Expiration Date:   01/07/2025   CBC with Differential    Standing Status:   Future    Expected Date:   10/09/2024    Expiration Date:   01/07/2025   Comprehensive metabolic panel    Standing Status:   Future    Expected Date:   10/09/2024    Expiration Date:   01/07/2025   Methylmalonic acid, serum    Standing Status:   Future    Expected Date:   10/09/2024    Expiration Date:   01/07/2025   Ferritin    Standing Status:   Future    Expected Date:   01/08/2025    Expiration Date:   04/08/2025   Iron and TIBC ([facility] ONLY)    Standing Status:    Future    Expected Date:   01/08/2025    Expiration Date:   04/08/2025   CBC with Differential    Standing Status:   Future    Expected Date:   01/08/2025    Expiration Date:   04/08/2025   Comprehensive metabolic panel    Standing Status:   Future    Expected Date:   01/08/2025    Expiration Date:   04/08/2025   Methylmalonic acid, serum    Standing Status:   Future    Expected Date:   01/08/2025    Expiration Date:   04/08/2025    INTERVAL HISTORY: Patient returns for follow-up for iron deficiency anemia status post IV iron last given in January 2025 and leukopenia.   Patient was seen in the ED on 04/27/2024 for dysuria.  She was given a dose of fosfomycin  and discharged home.  She is here today with her son.  He reports about 2 weeks ago she had a bloody nose that lasted for almost 24 hours.  Reports this happens often and intermittently throughout the month.  Reports this has been going on for as long as he can remember.  Reports she normally just puts tissue in her nose and eventually it will stop.  He does  think that she was seen by ENT at some time and was told this was just due to change in the weather.  They have not seen ENT in quite some time.  She denies any B symptoms such as fever, chills, night sweats, or unexplained weight loss.  She has not noticed any new lumps or bumps.  She remains on methotrexate  20 mg weekly for her rheumatoid arthritis.  She denies any signs of bleeding such as rectal bleeding or melena.   We reviewed ferritin, iron panel, CBC, CMP and MMA.  SUMMARY OF HEMATOLOGIC HISTORY: Oncology History   No problem history exists.   1.  Leukopenia - Here at the request of her primary care provider (NP Vernell Beams) for the evaluation of leukopenia - Lab records sent by PCP included CBC (06/11/2021) with mildly low WBC 3.3 with normal differential, normal Hgb 11.7/MCV 97, normal platelets 238.  Prior labs (03/19/2021) show WBC 3.3 with normal differential.   Before that, patient had normal WBC 4.1 (07/08/2020). - She denies any frequent infections or B symptoms    - No lymphadenopathy or hepatosplenomegaly on exam.     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  Iron deficiency anemia - Being managed by primary care provider - Currently taking ferrous sulfate  325 mg daily - No rectal bleeding or melena -Additional anemia workup (06/23/2022): Normal B12, folate, MMA, copper .  CMP shows normal kidney function.  Ferritin 70 with iron saturation 25%.  Normal SPEP and immunofixation.  Mild elevations in kappa and lambda light chains, but with normal ratio 1.44. -Suspect anemia of chronic disease versus anemia secondary to methotrexate .  May also represent early MDS.   3.  Other history - PMH:  Hypertrophic obstructive cardiomyopathy, hypertension, CAD s/p stents, diastolic congestive heart failure, gouty arthritis, hyperlipidemia, GERD, iron deficiency anemia, and rheumatoid arthritis - SOCIAL: She lives at home.  She worked as a futures trader.  No tobacco, alcohol, illicit drug use. - FAMILY: No known blood disorders or malignancy in the patient's family.  CBC    Component Value Date/Time   WBC 4.4 07/04/2024 0919   RBC 3.23 (L) 07/04/2024 0919   HGB 11.0 (L) 07/04/2024 0919   HGB 10.2 (L) 03/01/2024 1356   HCT 34.0 (L) 07/04/2024 0919   HCT 31.2 (L) 03/01/2024 1356   PLT 152 07/04/2024 0919   PLT 159 03/01/2024 1356   MCV 105.3 (H) 07/04/2024 0919   MCV 106 (H) 03/01/2024 1356   MCH 34.1 (H) 07/04/2024 0919   MCHC  32.4 07/04/2024 0919   RDW 19.0 (H) 07/04/2024 0919   RDW 16.6 (H) 03/01/2024 1356   LYMPHSABS 0.8 07/04/2024 0919   MONOABS 0.6 07/04/2024 0919   EOSABS 0.1 07/04/2024 0919   BASOSABS 0.1 07/04/2024 0919       Latest Ref Rng & Units 07/04/2024    9:19 AM 03/01/2024    1:56 PM 01/04/2024    9:52 AM  CMP  Glucose 70 - 99 mg/dL 86  86  852   BUN 8 - 23 mg/dL 10  26  22    Creatinine 0.44 - 1.00 mg/dL 9.08  9.01  9.01   Sodium 135 -  145 mmol/L 141  139  139   Potassium 3.5 - 5.1 mmol/L 4.2  4.0  4.1   Chloride 98 - 111 mmol/L 104  101  103   CO2 22 - 32 mmol/L 24  22  27    Calcium  8.9 - 10.3 mg/dL 9.1  8.6  9.2   Total Protein 6.5 - 8.1 g/dL 7.2   8.0   Total Bilirubin 0.0 - 1.2 mg/dL 0.5   0.7   Alkaline Phos 38 - 126 U/L 76   69   AST 15 - 41 U/L 28   22   ALT 0 - 44 U/L 10   12      Lab Results  Component Value Date   FERRITIN 378 (H) 07/04/2024   VITAMINB12 1,007 (H) 06/30/2023    Vitals:   07/11/24 1020  BP: 130/77  Pulse: 67  Resp: 16  Temp: 97.9 F (36.6 C)  SpO2: 97%    Review of System:  Review of Systems  Constitutional:  Negative for malaise/fatigue.  HENT:  Positive for nosebleeds.   Neurological:  Negative for weakness.    Physical Exam: Physical Exam Constitutional:      Appearance: Normal appearance.  HENT:     Head: Normocephalic and atraumatic.  Eyes:     Pupils: Pupils are equal, round, and reactive to light.  Cardiovascular:     Rate and Rhythm: Normal rate and regular rhythm.     Heart sounds: Normal heart sounds. No murmur heard. Pulmonary:     Effort: Pulmonary effort is normal.     Breath sounds: Normal breath sounds. No wheezing.  Abdominal:     General: Bowel sounds are normal. There is no distension.     Palpations: Abdomen is soft.     Tenderness: There is no abdominal tenderness.  Musculoskeletal:        General: Normal range of motion.     Cervical back: Normal range of motion.  Skin:    General: Skin is warm and dry.      Findings: No rash.  Neurological:     Mental Status: She is alert and oriented to person, place, and time.     Gait: Gait is intact.  Psychiatric:        Mood and Affect: Mood and affect normal.        Cognition and Memory: Memory normal.        Judgment: Judgment normal.      I spent 20 minutes dedicated to the care of this patient (face-to-face and non-face-to-face) on the date of the encounter to include what is described in the assessment and plan.,  Delon Hope, NP 07/11/2024 10:45 AM "

## 2024-07-11 NOTE — Assessment & Plan Note (Addendum)
-   Etiology felt to be due to chronic blood loss from nosebleeds. -Discussed patient following up with ENT given that this appears to be happening more frequently.  -Most recent labs 07/04/24 show ferritin 378, iron saturation 12%, and TIBC 262.  Hemoglobin has declined to 11.0 from 14.3 6 months ago. -She received 2 doses of IV Feraheme on 07/17/2023 and 07/24/2023. -Recommend 2 additional doses of IV Feraheme. -Continue iron tablet daily as prescribed by PCP -Return to clinic in 3 months with lab only and in 6 months with lab and CMP.

## 2024-07-11 NOTE — Assessment & Plan Note (Addendum)
-   Labs from 07/04/2024 showed WBC of 4.4. 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 - If she develops any significant neutropenia or other cytopenias in the future, we will speak with Dr. Curt (rheumatology in [place]) and see if we can decrease the dose of her methotrexate .

## 2024-07-17 ENCOUNTER — Inpatient Hospital Stay

## 2024-07-17 VITALS — BP 100/54 | HR 63 | Temp 96.7°F | Resp 18

## 2024-07-17 DIAGNOSIS — D509 Iron deficiency anemia, unspecified: Secondary | ICD-10-CM

## 2024-07-17 MED ORDER — CETIRIZINE HCL 10 MG PO TABS
10.0000 mg | ORAL_TABLET | Freq: Once | ORAL | Status: AC
  Administered 2024-07-17: 10 mg via ORAL
  Filled 2024-07-17: qty 1

## 2024-07-17 MED ORDER — SODIUM CHLORIDE 0.9 % IV SOLN
510.0000 mg | Freq: Once | INTRAVENOUS | Status: AC
  Administered 2024-07-17: 510 mg via INTRAVENOUS
  Filled 2024-07-17: qty 510

## 2024-07-17 MED ORDER — SODIUM CHLORIDE 0.9 % IV SOLN: INTRAVENOUS | Status: DC

## 2024-07-17 MED ORDER — ACETAMINOPHEN 325 MG PO TABS
650.0000 mg | ORAL_TABLET | Freq: Once | ORAL | Status: AC
  Administered 2024-07-17: 650 mg via ORAL
  Filled 2024-07-17: qty 2

## 2024-07-17 NOTE — Progress Notes (Signed)
 Patient tolerated iron infusion with no complaints voiced.  Peripheral IV site clean and dry with good blood return noted before and after infusion.  Band aid applied.  VSS with discharge and left in satisfactory condition with no s/s of distress noted.

## 2024-07-17 NOTE — Patient Instructions (Signed)

## 2024-07-18 ENCOUNTER — Ambulatory Visit (HOSPITAL_COMMUNITY)

## 2024-07-24 ENCOUNTER — Inpatient Hospital Stay

## 2024-07-24 VITALS — BP 102/80 | HR 65 | Temp 96.6°F | Resp 18

## 2024-07-24 DIAGNOSIS — D509 Iron deficiency anemia, unspecified: Secondary | ICD-10-CM

## 2024-07-24 MED ORDER — SODIUM CHLORIDE 0.9 % IV SOLN: INTRAVENOUS | Status: DC

## 2024-07-24 MED ORDER — SODIUM CHLORIDE 0.9 % IV SOLN
510.0000 mg | Freq: Once | INTRAVENOUS | Status: AC
  Administered 2024-07-24: 510 mg via INTRAVENOUS
  Filled 2024-07-24: qty 510

## 2024-07-24 MED ORDER — CETIRIZINE HCL 10 MG PO TABS
10.0000 mg | ORAL_TABLET | Freq: Once | ORAL | Status: AC
  Administered 2024-07-24: 10 mg via ORAL
  Filled 2024-07-24: qty 1

## 2024-07-24 MED ORDER — ACETAMINOPHEN 325 MG PO TABS
650.0000 mg | ORAL_TABLET | Freq: Once | ORAL | Status: AC
  Administered 2024-07-24: 650 mg via ORAL
  Filled 2024-07-24: qty 2

## 2024-07-24 NOTE — Patient Instructions (Signed)
 [facility] [place] - A DEPT OF [facility]  Discharge Instructions: Thank you for choosing [facility] to provide your oncology and hematology care.  If you have a lab appointment with the Cancer Center - please note that after April 8th, 2024, all labs will be drawn in the cancer center.  You do not have to check in or register with the main entrance as you have in the past but will complete your check-in in the cancer center.  Wear comfortable clothing and clothing appropriate for easy access to any Portacath or PICC line.   We strive to give you quality time with your provider. You may need to reschedule your appointment if you arrive late (15 or more minutes).  Arriving late affects you and other patients whose appointments are after yours.  Also, if you miss three or more appointments without notifying the office, you may be dismissed from the clinic at the provider's discretion.      For prescription refill requests, have your pharmacy contact our office and allow 72 hours for refills to be completed.    Today you received the following iron infusion: feraheme   To help prevent nausea and vomiting after your treatment, we encourage you to take your nausea medication as directed.  BELOW ARE SYMPTOMS THAT SHOULD BE REPORTED IMMEDIATELY: *FEVER GREATER THAN 100.4 F (38 C) OR HIGHER *CHILLS OR SWEATING *NAUSEA AND VOMITING THAT IS NOT CONTROLLED WITH YOUR NAUSEA MEDICATION *UNUSUAL SHORTNESS OF BREATH *UNUSUAL BRUISING OR BLEEDING *URINARY PROBLEMS (pain or burning when urinating, or frequent urination) *BOWEL PROBLEMS (unusual diarrhea, constipation, pain near the anus) TENDERNESS IN MOUTH AND THROAT WITH OR WITHOUT PRESENCE OF ULCERS (sore throat, sores in mouth, or a toothache) UNUSUAL RASH, SWELLING OR PAIN  UNUSUAL VAGINAL DISCHARGE OR ITCHING   Items with * indicate a potential emergency and should be followed up as soon as possible or go to  the Emergency Department if any problems should occur.  Please show the CHEMOTHERAPY ALERT CARD or IMMUNOTHERAPY ALERT CARD at check-in to the Emergency Department and triage nurse.  Should you have questions after your visit or need to cancel or reschedule your appointment, please contact [facility] [place] - A DEPT OF Eligha Brideg[facility] [phone]  and follow the prompts.  Office hours are 8:00 a.m. to 4:30 p.m. Monday - Friday. Please note that voicemails left after 4:00 p.m. may not be returned until the following business day.  We are closed weekends and major holidays. You have access to a nurse at all times for urgent questions. Please call the main number to the clinic [phone] and follow the prompts.  For any non-urgent questions, you may also contact your provider using MyChart. We now offer e-Visits for anyone 64 and older to request care online for non-urgent symptoms. For details visit [email or web address].   Also download the MyChart app! Go to the app store, search "MyChart", open the app, select [place], and log in with your MyChart username and password.

## 2024-07-24 NOTE — Progress Notes (Signed)
 Feraheme iron infusion given per orders. Patient tolerated it well without problems. Vitals stable and discharged home from clinic ambulatory. Follow up as scheduled.

## 2024-07-25 ENCOUNTER — Encounter (HOSPITAL_COMMUNITY): Payer: Self-pay

## 2024-07-25 ENCOUNTER — Ambulatory Visit (HOSPITAL_COMMUNITY)
Admission: RE | Admit: 2024-07-25 | Discharge: 2024-07-25 | Disposition: A | Discharge status: Home / Self Care | Source: Ambulatory Visit | Attending: Adult Health | Admitting: Adult Health

## 2024-07-25 DIAGNOSIS — Z1231 Encounter for screening mammogram for malignant neoplasm of breast: Secondary | ICD-10-CM | POA: Insufficient documentation

## 2024-07-29 ENCOUNTER — Other Ambulatory Visit: Payer: Self-pay

## 2024-07-29 ENCOUNTER — Observation Stay (HOSPITAL_COMMUNITY)
Admission: EM | Admit: 2024-07-29 | Discharge: 2024-07-31 | DRG: 811 | Disposition: A | Attending: Hospitalist | Admitting: Hospitalist

## 2024-07-29 ENCOUNTER — Emergency Department (HOSPITAL_COMMUNITY)

## 2024-07-29 DIAGNOSIS — Z811 Family history of alcohol abuse and dependence: Secondary | ICD-10-CM

## 2024-07-29 DIAGNOSIS — I2510 Atherosclerotic heart disease of native coronary artery without angina pectoris: Secondary | ICD-10-CM | POA: Diagnosis present

## 2024-07-29 DIAGNOSIS — Z79899 Other long term (current) drug therapy: Secondary | ICD-10-CM

## 2024-07-29 DIAGNOSIS — I480 Paroxysmal atrial fibrillation: Secondary | ICD-10-CM | POA: Diagnosis present

## 2024-07-29 DIAGNOSIS — I130 Hypertensive heart and chronic kidney disease with heart failure and stage 1 through stage 4 chronic kidney disease, or unspecified chronic kidney disease: Secondary | ICD-10-CM | POA: Diagnosis present

## 2024-07-29 DIAGNOSIS — N1831 Chronic kidney disease, stage 3a: Secondary | ICD-10-CM | POA: Diagnosis present

## 2024-07-29 DIAGNOSIS — D649 Anemia, unspecified: Secondary | ICD-10-CM | POA: Diagnosis present

## 2024-07-29 DIAGNOSIS — I421 Obstructive hypertrophic cardiomyopathy: Secondary | ICD-10-CM | POA: Diagnosis present

## 2024-07-29 DIAGNOSIS — Z7984 Long term (current) use of oral hypoglycemic drugs: Secondary | ICD-10-CM

## 2024-07-29 DIAGNOSIS — Z6829 Body mass index (BMI) 29.0-29.9, adult: Secondary | ICD-10-CM

## 2024-07-29 DIAGNOSIS — D62 Acute posthemorrhagic anemia: Principal | ICD-10-CM | POA: Diagnosis present

## 2024-07-29 DIAGNOSIS — Z860100 Personal history of colon polyps, unspecified: Secondary | ICD-10-CM

## 2024-07-29 DIAGNOSIS — Z7983 Long term (current) use of bisphosphonates: Secondary | ICD-10-CM

## 2024-07-29 DIAGNOSIS — I5032 Chronic diastolic (congestive) heart failure: Secondary | ICD-10-CM | POA: Diagnosis present

## 2024-07-29 DIAGNOSIS — E66811 Obesity, class 1: Secondary | ICD-10-CM | POA: Diagnosis present

## 2024-07-29 DIAGNOSIS — Z8379 Family history of other diseases of the digestive system: Secondary | ICD-10-CM

## 2024-07-29 DIAGNOSIS — Z8249 Family history of ischemic heart disease and other diseases of the circulatory system: Secondary | ICD-10-CM

## 2024-07-29 DIAGNOSIS — K219 Gastro-esophageal reflux disease without esophagitis: Secondary | ICD-10-CM | POA: Diagnosis present

## 2024-07-29 DIAGNOSIS — M109 Gout, unspecified: Secondary | ICD-10-CM | POA: Diagnosis present

## 2024-07-29 DIAGNOSIS — J189 Pneumonia, unspecified organism: Secondary | ICD-10-CM | POA: Diagnosis present

## 2024-07-29 DIAGNOSIS — Z7901 Long term (current) use of anticoagulants: Secondary | ICD-10-CM

## 2024-07-29 DIAGNOSIS — R040 Epistaxis: Principal | ICD-10-CM | POA: Diagnosis present

## 2024-07-29 DIAGNOSIS — Z1152 Encounter for screening for COVID-19: Secondary | ICD-10-CM

## 2024-07-29 DIAGNOSIS — M069 Rheumatoid arthritis, unspecified: Secondary | ICD-10-CM | POA: Diagnosis present

## 2024-07-29 DIAGNOSIS — J470 Bronchiectasis with acute lower respiratory infection: Secondary | ICD-10-CM | POA: Diagnosis present

## 2024-07-29 DIAGNOSIS — I10 Essential (primary) hypertension: Secondary | ICD-10-CM | POA: Diagnosis present

## 2024-07-29 DIAGNOSIS — E785 Hyperlipidemia, unspecified: Secondary | ICD-10-CM | POA: Diagnosis present

## 2024-07-29 DIAGNOSIS — Z955 Presence of coronary angioplasty implant and graft: Secondary | ICD-10-CM

## 2024-07-29 LAB — CBC WITH DIFFERENTIAL/PLATELET
Abs Immature Granulocytes: 0.02 10*3/uL (ref 0.00–0.07)
Basophils Absolute: 0 10*3/uL (ref 0.0–0.1)
Basophils Relative: 0 %
Eosinophils Absolute: 0.1 10*3/uL (ref 0.0–0.5)
Eosinophils Relative: 1 %
HCT: 26.1 % — ABNORMAL LOW (ref 36.0–46.0)
Hemoglobin: 8.4 g/dL — ABNORMAL LOW (ref 12.0–15.0)
Immature Granulocytes: 0 %
Lymphocytes Relative: 16 %
Lymphs Abs: 0.8 10*3/uL (ref 0.7–4.0)
MCH: 33.7 pg (ref 26.0–34.0)
MCHC: 32.2 g/dL (ref 30.0–36.0)
MCV: 104.8 fL — ABNORMAL HIGH (ref 80.0–100.0)
Monocytes Absolute: 0.5 10*3/uL (ref 0.1–1.0)
Monocytes Relative: 11 %
Neutro Abs: 3.4 10*3/uL (ref 1.7–7.7)
Neutrophils Relative %: 72 %
Platelets: 122 10*3/uL — ABNORMAL LOW (ref 150–400)
RBC: 2.49 MIL/uL — ABNORMAL LOW (ref 3.87–5.11)
RDW: 18.3 % — ABNORMAL HIGH (ref 11.5–15.5)
WBC: 4.8 10*3/uL (ref 4.0–10.5)
nRBC: 0 % (ref 0.0–0.2)

## 2024-07-29 LAB — PRO BRAIN NATRIURETIC PEPTIDE: Pro Brain Natriuretic Peptide: 1164 pg/mL — ABNORMAL HIGH

## 2024-07-29 LAB — BASIC METABOLIC PANEL WITH GFR
Anion gap: 8 (ref 5–15)
BUN: 19 mg/dL (ref 8–23)
CO2: 23 mmol/L (ref 22–32)
Calcium: 8.3 mg/dL — ABNORMAL LOW (ref 8.9–10.3)
Chloride: 110 mmol/L (ref 98–111)
Creatinine, Ser: 0.77 mg/dL (ref 0.44–1.00)
GFR, Estimated: >60 mL/min
Glucose, Bld: 128 mg/dL — ABNORMAL HIGH (ref 70–99)
Potassium: 4.4 mmol/L (ref 3.5–5.1)
Sodium: 141 mmol/L (ref 135–145)

## 2024-07-29 LAB — TROPONIN T, HIGH SENSITIVITY: Troponin T High Sensitivity: 17 ng/L (ref 0–19)

## 2024-07-29 MED ORDER — OXYMETAZOLINE HCL 0.05 % NA SOLN
1.0000 | Freq: Once | NASAL | Status: AC
  Administered 2024-07-29: 1 via NASAL
  Filled 2024-07-29: qty 30

## 2024-07-29 MED ORDER — SILVER NITRATE-POT NITRATE 75-25 % EX MISC
1.0000 | Freq: Once | CUTANEOUS | Status: AC
  Administered 2024-07-29: 1 via TOPICAL
  Filled 2024-07-29: qty 10

## 2024-07-29 MED ORDER — SODIUM CHLORIDE 0.9 % IV BOLUS
500.0000 mL | Freq: Once | INTRAVENOUS | Status: AC
  Administered 2024-07-29: 500 mL via INTRAVENOUS

## 2024-07-29 MED ORDER — AZITHROMYCIN 250 MG PO TABS
500.0000 mg | ORAL_TABLET | Freq: Once | ORAL | Status: AC
  Administered 2024-07-29: 500 mg via ORAL
  Filled 2024-07-29: qty 2

## 2024-07-29 MED ORDER — SODIUM CHLORIDE 0.9 % IV SOLN
1.0000 g | Freq: Once | INTRAVENOUS | Status: AC
  Administered 2024-07-29: 1 g via INTRAVENOUS
  Filled 2024-07-29: qty 10

## 2024-07-29 NOTE — ED Triage Notes (Addendum)
 Pt arrived via POV cc of epistaxis for the past 24 hours. Pts family endorses this has happened before. SOB that started around 1600. Per the family. PMH of CHF with a defibrillator. Pt has PMH of iron infusion. Pt endorses being lightheaded today as well.   Pt is 97% on room air in triage.

## 2024-07-30 LAB — CBC WITH DIFFERENTIAL/PLATELET
Abs Immature Granulocytes: 0.01 10*3/uL (ref 0.00–0.07)
Basophils Absolute: 0 10*3/uL (ref 0.0–0.1)
Basophils Relative: 0 %
Eosinophils Absolute: 0 10*3/uL (ref 0.0–0.5)
Eosinophils Relative: 1 %
HCT: 22.7 % — ABNORMAL LOW (ref 36.0–46.0)
Hemoglobin: 7.1 g/dL — ABNORMAL LOW (ref 12.0–15.0)
Immature Granulocytes: 0 %
Lymphocytes Relative: 22 %
Lymphs Abs: 0.8 10*3/uL (ref 0.7–4.0)
MCH: 33 pg (ref 26.0–34.0)
MCHC: 31.3 g/dL (ref 30.0–36.0)
MCV: 105.6 fL — ABNORMAL HIGH (ref 80.0–100.0)
Monocytes Absolute: 0.5 10*3/uL (ref 0.1–1.0)
Monocytes Relative: 12 %
Neutro Abs: 2.5 10*3/uL (ref 1.7–7.7)
Neutrophils Relative %: 65 %
Platelets: 115 10*3/uL — ABNORMAL LOW (ref 150–400)
RBC: 2.15 MIL/uL — ABNORMAL LOW (ref 3.87–5.11)
RDW: 18.3 % — ABNORMAL HIGH (ref 11.5–15.5)
WBC: 3.8 10*3/uL — ABNORMAL LOW (ref 4.0–10.5)
nRBC: 0 % (ref 0.0–0.2)

## 2024-07-30 LAB — BASIC METABOLIC PANEL WITH GFR
Anion gap: 10 (ref 5–15)
BUN: 18 mg/dL (ref 8–23)
CO2: 20 mmol/L — ABNORMAL LOW (ref 22–32)
Calcium: 7.9 mg/dL — ABNORMAL LOW (ref 8.9–10.3)
Chloride: 111 mmol/L (ref 98–111)
Creatinine, Ser: 0.63 mg/dL (ref 0.44–1.00)
GFR, Estimated: >60 mL/min
Glucose, Bld: 99 mg/dL (ref 70–99)
Potassium: 4.2 mmol/L (ref 3.5–5.1)
Sodium: 140 mmol/L (ref 135–145)

## 2024-07-30 LAB — RESP PANEL BY RT-PCR (RSV, FLU A&B, COVID)  RVPGX2
Influenza A by PCR: NEGATIVE
Influenza B by PCR: NEGATIVE
Resp Syncytial Virus by PCR: NEGATIVE
SARS Coronavirus 2 by RT PCR: NEGATIVE

## 2024-07-30 LAB — LACTIC ACID, PLASMA: Lactic Acid, Venous: 1.2 mmol/L (ref 0.5–1.9)

## 2024-07-30 LAB — PREPARE RBC (CROSSMATCH)

## 2024-07-30 LAB — MAGNESIUM: Magnesium: 2.2 mg/dL (ref 1.7–2.4)

## 2024-07-30 LAB — TROPONIN T, HIGH SENSITIVITY: Troponin T High Sensitivity: 16 ng/L (ref 0–19)

## 2024-07-30 LAB — PHOSPHORUS: Phosphorus: 3.2 mg/dL (ref 2.5–4.6)

## 2024-07-30 MED ORDER — ATORVASTATIN CALCIUM 10 MG PO TABS
10.0000 mg | ORAL_TABLET | Freq: Every day | ORAL | Status: DC
  Administered 2024-07-30 (×2): 10 mg via ORAL
  Filled 2024-07-30 (×2): qty 1

## 2024-07-30 MED ORDER — POLYETHYLENE GLYCOL 3350 17 G PO PACK: 17.0000 g | PACK | Freq: Every day | ORAL | Status: DC | PRN

## 2024-07-30 MED ORDER — PANTOPRAZOLE SODIUM 40 MG PO TBEC
40.0000 mg | DELAYED_RELEASE_TABLET | Freq: Every day | ORAL | Status: DC
  Administered 2024-07-30 – 2024-07-31 (×2): 40 mg via ORAL
  Filled 2024-07-30 (×2): qty 1

## 2024-07-30 MED ORDER — ACETAMINOPHEN 325 MG PO TABS: 650.0000 mg | ORAL_TABLET | Freq: Four times a day (QID) | ORAL | Status: DC | PRN

## 2024-07-30 MED ORDER — BOOST / RESOURCE BREEZE PO LIQD CUSTOM
1.0000 | Freq: Two times a day (BID) | ORAL | Status: DC
  Administered 2024-07-30 – 2024-07-31 (×2): 1 via ORAL

## 2024-07-30 MED ORDER — FENTANYL CITRATE (PF) 50 MCG/ML IJ SOSY
50.0000 ug | PREFILLED_SYRINGE | INTRAMUSCULAR | Status: DC | PRN
  Filled 2024-07-30: qty 1

## 2024-07-30 MED ORDER — SODIUM CHLORIDE 0.9 % IV SOLN
1.0000 g | INTRAVENOUS | Status: DC
  Administered 2024-07-30: 1 g via INTRAVENOUS
  Filled 2024-07-30: qty 10

## 2024-07-30 MED ORDER — FOLIC ACID 1 MG PO TABS
1.0000 mg | ORAL_TABLET | Freq: Every day | ORAL | Status: DC
  Administered 2024-07-30 – 2024-07-31 (×2): 1 mg via ORAL
  Filled 2024-07-30 (×2): qty 1

## 2024-07-30 MED ORDER — SODIUM CHLORIDE 0.9% IV SOLUTION
Freq: Once | INTRAVENOUS | Status: AC
  Administered 2024-07-30: 10 mL via INTRAVENOUS

## 2024-07-30 MED ORDER — ADULT MULTIVITAMIN W/MINERALS CH
1.0000 | ORAL_TABLET | Freq: Every day | ORAL | Status: DC
  Administered 2024-07-30 – 2024-07-31 (×2): 1 via ORAL
  Filled 2024-07-30 (×2): qty 1

## 2024-07-30 MED ORDER — FUROSEMIDE 10 MG/ML IJ SOLN
20.0000 mg | Freq: Once | INTRAMUSCULAR | Status: AC
  Administered 2024-07-30: 20 mg via INTRAVENOUS
  Filled 2024-07-30: qty 2

## 2024-07-30 MED ORDER — PROCHLORPERAZINE EDISYLATE 10 MG/2ML IJ SOLN: 5.0000 mg | Freq: Four times a day (QID) | INTRAMUSCULAR | Status: DC | PRN

## 2024-07-30 MED ORDER — FERROUS SULFATE 325 (65 FE) MG PO TABS
325.0000 mg | ORAL_TABLET | Freq: Two times a day (BID) | ORAL | Status: DC
  Administered 2024-07-30 – 2024-07-31 (×4): 325 mg via ORAL
  Filled 2024-07-30 (×4): qty 1

## 2024-07-30 MED ORDER — MELATONIN 3 MG PO TABS: 6.0000 mg | ORAL_TABLET | Freq: Every evening | ORAL | Status: DC | PRN

## 2024-07-30 MED ORDER — OXYMETAZOLINE HCL 0.05 % NA SOLN
2.0000 | Freq: Two times a day (BID) | NASAL | Status: DC
  Administered 2024-07-30 – 2024-07-31 (×3): 2 via NASAL

## 2024-07-30 MED ORDER — EZETIMIBE 10 MG PO TABS
10.0000 mg | ORAL_TABLET | Freq: Every day | ORAL | Status: DC
  Administered 2024-07-30 – 2024-07-31 (×2): 10 mg via ORAL
  Filled 2024-07-30 (×2): qty 1

## 2024-07-30 MED ORDER — AZITHROMYCIN 250 MG PO TABS
500.0000 mg | ORAL_TABLET | Freq: Every day | ORAL | Status: DC
  Administered 2024-07-30 – 2024-07-31 (×2): 500 mg via ORAL
  Filled 2024-07-30 (×2): qty 2

## 2024-07-30 NOTE — Discharge Instructions (Signed)
Heart Failure Nutrition Therapy For The Undernourished  This nutrition therapy will help you eat more calories and protein in addition to helping your heart. These suggestions can help you if you can't eat enough, have lost weight, or need extra calories and protein in your diet. This plan focuses on: Eating more calories.  Eating more calories can give you more energy, help you gain weight, and promote wound healing. Eating more protein. Protein can help you heal, give you energy, and build and repair muscle. Ask your registered dietitian nutritionist (RDN) how much protein is the right amount for you. Eating a low-sodium diet while increasing your calorie and protein intake. This will help control buildup of fluids around your heart, stomach, lungs, and legs. Too much sodium may make your blood pressure too high and put stress on your heart. You can achieve these goals by: Eating more calories and protein. Eating less than 2,000 milligrams of sodium per day. Reading food labels to keep track of how much sodium, protein, and calories are in the foods you eat. Limiting fluid intake if your doctor has asked you to follow a fluid restriction. Reading the Food Label: How Much Sodium Is too Much? The nutrition plan for heart failure usually limits the sodium that you get from food and beverages to 2,000 milligrams per day. Salt is the main source of sodium. Read the nutrition label to find out how much sodium is in 1 serving. Foods with more than 300 milligrams of sodium per serving may not fit into a reduced-sodium meal plan. Check serving sizes on the label. If you eat more than 1 serving, you will get more sodium than the amount listed. You can find protein and calories on the Nutrition Facts label too. Eating More Protein and Calories Eat at least 6 small meals throughout the day. If you become full quickly after you begin eating, you may benefit from small, frequent meals instead of 3 large  meals. Keep high-calorie and high-protein snacks available in your car or bag for when you get hungry. Add extra calories and protein when cooking meals. Cook with unsalted butter, margarine, or oils instead of calorie-free cooking spray. Use reduced-fat (2%) milk instead of fat-free (skim) milk. Ask your RDN if whole milk is recommended for you. Add milk powder to protein shakes, cereal, or casseroles. Sprinkle unsalted nuts and seeds into your cereal, stir-fry, or salad. Add 1 tablespoon mayonnaise, sugar, or honey to foods (adds 50 to 100 calories). Add calories and protein to your fruits and vegetables. Fruits are low in calories. Add peanut butter, yogurt, or cottage cheese to add calories and protein. Buy fruit cups canned in syrup instead of water or juice. Vegetables are also low in calories. Add butter, sour cream, margarine, or oil for extra calories. Potatoes, corn, and peas are higher in calories than nonstarchy vegetables. Avocados are rich in calories and healthy fat. Eat them alone or use them as a topping or spread. Limit foods that have little to no nutrition. Eat fewer calorie-free and low-calorie foods such as applesauce, Jell-O, and diet soft drinks. These foods will take up space in your stomach and may leave little room for higher-calorie foods and beverages.  When shopping, avoid products that say "low calorie" or "low fat." Drink high-calorie beverages. There are several liquid nutrition supplements available that also have less than 300 milligrams of sodium per serving. Drink them between meals as snacks.  Make your own supplement at home with milk or ice   cream.  Ask your RDN if protein powder would also be a good addition. Milk and juice are high in calories. Limit plain tea, coffee, and water. These have no calories.  Foods Recommended Food Group Foods Recommended  Grains Whole wheat bread with less than 80 milligrams of sodium per slice  Many cold cereals,  especially shredded wheat and granola Oats or cream of wheat made with milk (add butter or margarine, sugar, dried fruit, and nuts for more calories and fat) Quinoa Wheat germ (sprinkle on soups, baked goods, or cereal)  Vegetables Fresh and frozen vegetables without added sauces, salt, or sodium Homemade soups (salt free or low sodium) with dry milk powder or cream Potatoes, corn, and peas  Fruits Canned fruits (canned in heavy syrup) Dried fruits, such as raisins, cranberries, and prunes Avocados  Dairy (Milk and Milk Products) Milk or milk powder Soy milk Yogurt, including Greek yogurt Small amounts of natural, blocked cheeses or reduced-sodium cheese (Swiss, ricotta, and fresh mozzarella are lower in sodium than others) Regular or soft cream cheese and low-sodium cottage cheese  Protein Foods (Meat, Poultry, Fish, and Beans) Fresh meat and fish Turkey bacon (check the Nutrition Facts label to make sure its not packaged in a sodium solution) Tuna canned or packed in oil Dried beans, peas, and legumes; edamame (fresh soybeans) Eggs Unsalted nuts or peanut butter  Desserts and Snacks Apple (with peanut butter); baked apple with sugar and butter             Granola bars Pudding made with reduced-fat (2%) milk or milk powder Smoothies  Custard Chocolate syrup added to milk or ice cream  Fats Tub or liquid margarine (trans fat-free) Butter (check with your doctor or RDN first) Unsaturated fat oils (canola, olive, corn, sunflower, safflower, peanut, vegetable, and soybean) Flaxseed (oil or ground)  Sodium-Free Condiments Fresh or dried herbs; pepper; vinegar; lemon juice or lime juice; salt-free seasoning mixes and marinades such as Mrs. Dash or McC[employer] salt-free blend; simple salad dressings such as vinegar and oil; low-sodium ketchup. You can purchase salt-free barbecue sauce and many others on the Internet.  Ask your RDN.     Foods Not Recommended Food Group Foods Not  Recommended  Grains Breads or crackers topped with salt Cereals (hot/cold) with more than 300 milligrams sodium per serving Biscuits, cornbread, and other "quick" breads prepared with baking soda Prepackaged bread crumbs Self-rising flours  Vegetables Canned vegetables (unless they are salt free or low sodium) Frozen vegetables with high-sodium seasoning and sauces Sauerkraut and pickled vegetables Canned or dried soups (unless they are salt free or low  sodium) French fries and onion rings Broth-based soups  Fruits Dried fruits preserved with sodium-containing additives  Dairy (Milk and Milk Products) Buttermilk Processed cheeses such as Cheese Wiz, Velveeta, and Queso Feta cheese Shredded cheese (has more sodium than blocked cheeses) "Singles" cheese slices and string cheese  Protein Foods (Meat, Poultry, Fish, and Beans) Cured meats (bacon, ham, sausage, pepperoni, and hot dogs)  Canned meats (chili, Vienna sausage, sardines, and Spam) Smoked fish and meats Frozen meals with more than 600 milligrams of sodium Egg Beaters   Fats Salted butter or margarine  Condiments Salt, sea salt, kosher salt, onion salt, and garlic salt Seasoning mixes containing salt such as Lemon Pepper or Lawry's Bouillon cubes Catsup or ketchup Barbecue sauce and Worcestershire sauce Soy sauce Salsa, pickles, olives, relish Salad dressings: ranch, blue cheese, Italian, and French  Alcohol Check with your doctor.   Heart Failure (  Undernourished) Sample 1-Day Menu View Nutrient Info Breakfast 1 cup oatmeal made with milk 1 tablespoon wheat germ (sprinkle on oatmeal) 1 cup (240 milliliters) reduced-fat (2%) milk 2 tablespoons chocolate syrup (add to milk) 1 banana 2 tablespoons peanut butter (for banana)  Morning Snack 1/4 cup raw almonds Cranberries (add to almonds) 1 cup (240 milliliters) oral nutritional supplement  Lunch 3 ounces grilled chicken breast 1 small potato 2 ounces cheese (to add to  potato) 2 tablespoons margarine (to add to potato) Croutons (add to salad) Walnuts (add to salad) Vinegar and oil dressing (add to salad) 1 cup (240 milliliters) juice  Afternoon Snack 10 tortilla chips Guacamole (avocado, lime juice, onion, and tomatoes, and pepper) 1 cup (240 milliliters) water or juice  Evening Meal 3 ounces herb-baked fish 1 cup homemade mashed potatoes with margarine (trans fat-free) and milk 1/2 cup creamed spinach 1 cup (240 milliliters) water or juice  Evening Snack 1 cup (240 milliliters) homemade milkshake 1 slice low-sodium turkey breast 1 slice whole wheat bread 1 slice cheese Mayonnaise  Daily Sum Nutrient Unit Value  Macronutrients  Energy kcal 3461  Energy kJ 14480  Protein g 151  Total lipid (fat) g 158  Carbohydrate, by difference g 381  Fiber, total dietary g 36  Sugars, total g 163  Minerals  Calcium, Ca mg 2598  Iron, Fe mg 32  Sodium, Na mg 2530  Vitamins  Vitamin C, total ascorbic acid mg 180  Vitamin A, IU IU 9009  Vitamin D IU 575  Lipids  Fatty acids, total saturated g 43  Fatty acids, total monounsaturated g 56  Fatty acids, total polyunsaturated g 48  Cholesterol mg 252     Heart Failure (Undernourished) Vegan Sample 1-Day Menu View Nutrient Info Breakfast 1 cup oatmeal made with: 1 cup soymilk fortified with calcium, vitamin B12, and vitamin D 2 tablespoons walnuts, unsalted 1 banana 1 cup orange juice with added calcium and vitamin D  Morning Snack  cup almond butter, unsalted 1 apple  cup granola  cup soy yogurt  Lunch 1 black bean burger, low sodium 1 hamburger bun 1 cup lettuce for salad with:  cup cucumbers, sliced  cup carrots, shredded 1 tablespoon cashews, unsalted 1 tablespoon sesame seed dressing, low sodium  cup pineapple  Afternoon Snack 1 pita, whole wheat  cup hummus  cup grapes  Evening Meal 1 cup cooked rice 1 cup red beans, cooked 1 cup corn, cooked  cup cucumber slices  cup  tomato, diced  Evening Snack Smoothie made with: 1 cup soymilk fortified with calcium, vitamin B12, and vitamin D 1 scoop soy protein powder, for smoothie 1 cup strawberries, for smoothie  Daily Sum Nutrient Unit Value  Macronutrients  Energy kcal 2831  Energy kJ 11850  Protein g 121  Total lipid (fat) g 85  Carbohydrate, by difference g 428  Fiber, total dietary g 71  Sugars, total g 154  Minerals  Calcium, Ca mg 2162  Iron, Fe mg 45  Sodium, Na mg 2054  Vitamins  Vitamin C, total ascorbic acid mg 296  Vitamin A, IU IU 17869  Vitamin D IU 318  Lipids  Fatty acids, total saturated g 9  Fatty acids, total monounsaturated g 36  Fatty acids, total polyunsaturated g 30  Cholesterol mg 0     Heart Failure (Undernourished) Vegetarian (Lacto-Ovo) Sample 1-Day Menu View Nutrient Info Breakfast 1 cup oatmeal made with: 1 cup 2% milk 1 slice toast, whole wheat topped with:  cup   avocado 1 cup orange juice with added calcium and vitamin D  Morning Snack  cup almonds, unsalted 2 tablespoons dried cranberries (add to almonds) 1 cup Greek yogurt, plain  Lunch Sandwich made with: 2 slices bread, whole wheat 2 tablespoons peanut butter, unsalted 1 tablespoon jam 4 carrot sticks with: 2 tablespoons hummus 1 banana  Afternoon Snack  cup granola  cup peanuts, unsalted 1 apple  Evening Meal 1 cup black bean soup, low sodium  cup tofu, cooked  cup cooked brown rice  cup broccoli, cooked  cup carrots, cooked  cup onions, cooked 2 tablespoons peanut oil  Evening Snack 5 crackers, whole wheat, low sodium 3 dried figs 1 cup 2% milk  Daily Sum Nutrient Unit Value  Macronutrients  Energy kcal 3020  Energy kJ 12636  Protein g 117  Total lipid (fat) g 133  Carbohydrate, by difference g 375  Fiber, total dietary g 58  Sugars, total g 148  Minerals  Calcium, Ca mg 2197  Iron, Fe mg 29  Sodium, Na mg 1434  Vitamins  Vitamin C, total ascorbic acid mg 174  Vitamin A, IU  IU 20295  Vitamin D IU 317  Lipids  Fatty acids, total saturated g 27  Fatty acids, total monounsaturated g 62  Fatty acids, total polyunsaturated g 34  Cholesterol mg 63    Copyright 2020  [employer]. All rights reserved  

## 2024-07-30 NOTE — Plan of Care (Signed)
   Problem: Education: Goal: Knowledge of General Education information will improve Description Including pain rating scale, medication(s)/side effects and non-pharmacologic comfort measures Outcome: Progressing

## 2024-07-30 NOTE — ED Provider Notes (Signed)
 Called by Dr. Ricky inpatient hospitalist to help manage patient's epistaxis. Patient was admitted last night from the ED with epistaxis on eliquis  requiring placement of a R-sided rhinorocket.  Patient was admitted yesterday for monitoring and blood transfusion. Earlier today she began to saturate her rhinorocket and bleed through. I was asked to come replace the rhinorocket.  Patient had a large clot extending from her nose and the [employer].  I deflated and removed the [employer] without difficulty.  Very large greater than 10 cm clot was removed with the [employer].  I asked patient to blow her nose hard and evacuated all the blood clot from her right nostril.  I placed 2 sprays of Afrin in the right nostril and held pressure for greater than 5 minutes.  Patient did not have recurrent bleeding.  I confirmed that patient did not feel like she was swallowing blood.  After 30 minutes I reevaluated the patient and she continued to have no bleeding.  I discussed with Dr. Ricky and updated him with her current status.  I recommended continued monitoring.  We will continue to hold her Eliquis .   Epistaxis Management  Date/Time: 07/30/2024 4:48 PM  Performed by: Franklyn Sid SAILOR, MD Authorized by: Franklyn Sid SAILOR, MD   Consent:    Consent obtained:  Verbal   Consent given by:  Patient   Risks discussed:  Bleeding and pain Universal protocol:    Patient identity confirmed:  Verbally with patient Anesthesia:    Anesthesia method:  None Procedure details:    Repair method: removed gel pack that was in place and managed with afrin/pressure.   Treatment complexity:  Limited   Treatment episode: recurring   Post-procedure details:    Assessment:  Bleeding stopped   Procedure completion:  Tolerated well, no immediate complications     Franklyn Sid SAILOR, MD 07/30/24 1649

## 2024-07-30 NOTE — ED Notes (Signed)
 Patient transported to [id number].

## 2024-07-30 NOTE — Plan of Care (Signed)

## 2024-07-30 NOTE — Plan of Care (Signed)
   Problem: Health Behavior/Discharge Planning: Goal: Ability to manage health-related needs will improve Outcome: Progressing

## 2024-07-31 ENCOUNTER — Other Ambulatory Visit: Payer: Self-pay | Admitting: Hospitalist

## 2024-07-31 DIAGNOSIS — D62 Acute posthemorrhagic anemia: Secondary | ICD-10-CM | POA: Insufficient documentation

## 2024-07-31 LAB — BPAM RBC
Blood Product Expiration Date: 202602222359
Blood Product Expiration Date: 202602222359
ISSUE DATE / TIME: 202602031413
ISSUE DATE / TIME: 202602031650
Unit Type and Rh: 6200
Unit Type and Rh: 6200

## 2024-07-31 LAB — TYPE AND SCREEN
ABO/RH(D): A POS
Antibody Screen: NEGATIVE
Unit division: 0
Unit division: 0

## 2024-07-31 LAB — CBC
HCT: 28.2 % — ABNORMAL LOW (ref 36.0–46.0)
Hemoglobin: 9.5 g/dL — ABNORMAL LOW (ref 12.0–15.0)
MCH: 31.9 pg (ref 26.0–34.0)
MCHC: 33.7 g/dL (ref 30.0–36.0)
MCV: 94.6 fL (ref 80.0–100.0)
Platelets: 99 10*3/uL — ABNORMAL LOW (ref 150–400)
RBC: 2.98 MIL/uL — ABNORMAL LOW (ref 3.87–5.11)
RDW: 24 % — ABNORMAL HIGH (ref 11.5–15.5)
WBC: 4.6 10*3/uL (ref 4.0–10.5)
nRBC: 0 % (ref 0.0–0.2)

## 2024-07-31 MED ORDER — METOPROLOL TARTRATE 25 MG PO TABS
25.0000 mg | ORAL_TABLET | Freq: Two times a day (BID) | ORAL | Status: DC
  Administered 2024-07-31: 25 mg via ORAL
  Filled 2024-07-31: qty 1

## 2024-07-31 MED ORDER — SPIRONOLACTONE 12.5 MG HALF TABLET
12.5000 mg | ORAL_TABLET | Freq: Every day | ORAL | Status: DC
  Administered 2024-07-31: 12.5 mg via ORAL
  Filled 2024-07-31: qty 1

## 2024-07-31 MED ORDER — FUROSEMIDE 20 MG PO TABS
20.0000 mg | ORAL_TABLET | Freq: Every day | ORAL | Status: DC
  Administered 2024-07-31: 20 mg via ORAL
  Filled 2024-07-31: qty 1

## 2024-07-31 MED ORDER — CEFDINIR 300 MG PO CAPS: 300.0000 mg | ORAL_CAPSULE | Freq: Two times a day (BID) | ORAL | 0 refills | Status: AC

## 2024-07-31 MED ORDER — ALLOPURINOL 300 MG PO TABS
300.0000 mg | ORAL_TABLET | Freq: Every day | ORAL | Status: DC
  Administered 2024-07-31: 300 mg via ORAL
  Filled 2024-07-31: qty 1

## 2024-07-31 NOTE — Discharge Summary (Signed)
 Physician Discharge Summary  Dana [name] FMW:[id number] DOB: February 20, 1941 DOA: 07/29/2024  PCP: Benjamin Raina Elizabeth, NP  Admit date: 07/29/2024 Discharge date: 07/31/2024  Admitted From: Home Disposition: Home  Recommendations for Outpatient Follow-up:  Follow up with PCP in 1 week with repeat CBC Follow up in ED if recurrence of symptoms  Referral to ENT for recurrent epistaxis  Home Health: No Equipment/Devices: None  Discharge Condition: Stable CODE STATUS: Full Diet recommendation: Heart healthy  Brief/Interim Summary:  Dana [name] is a 84 y.o. female with medical history significant for frequent nosebleeds, iron deficiency anemia due to chronic blood loss from nosebleed, on regular iron Feraheme infusions followed by hematology, hypertrophic obstructive cardiomyopathy, hypertension, hyperlipidemia, coronary artery disease status post PCI with stent placement in 2018, HFpEF, paroxysmal A-fib on Eliquis , GERD, rheumatoid arthritis, who presents to the ER due to intractable nosebleed for more than a day.  Patient has been taking Eliquis .  She saw an ENT provider many years ago and was told that her nosebleeds were due to cold temperatures, has not returned to ENT.  Admits to intermittent minimally productive cough with white phlegm.  No reported subjective fevers.  Last dose of home Eliquis  was taken morning of 07/29/2024.   In the ER, weak appearing.  Epistaxis resolved after nasal balloon placed by EDP.  Chest x-ray showing concern for possible left lower lobe pneumonia.    The patient received Rocephin  and azithromycin  in the ER for CAP.  Admitted by [facility], hospitalist service.   ED Course: Temperature 97.8.  BP 105/66, pulse 82, respiration rate 20, O2 saturation 100% on room air.   Assessment & Plan: 1-epistaxis - Rhino Rocket placed in the ER. -Rhino Rocket was removed 1 day prior to discharge, no rebleeding noted. - Continue to hold anticoagulation at this time. - Patient is  advised to resume anticoagulation if no bleeding for next 2 days. - Discussed possibility of rebleeding with anticoagulation.  Patient is recommended to follow-up with ENT.  Referral placed  2-symptomatic anemia - Hemoglobin down to 7 likely from recurrent epistaxis.  He received 2 unit PRBC transfusion.  Hemoglobin improved to 9.5 on 2/12  3-paroxysmal atrial fibrillation - Continue to hold anticoagulation for the next 2 days due to epistaxis. -Discussed risk-benefit.  Patient would like to continue anticoagulation in the long-term due to risk of stroke.  4-possible left lower lobe pneumonia/bronchiectasis - Received IV antibiotics ~2 doses in the hospital.  Will discharge on cefdinir  for 5 days  5-hypertrophic obstructive cardiomyopathy/chronic diastolic heart failure - Appears to be stable and compensated - Most recent echo in November 2025 demonstrating preserved ejection fraction and grade 2 diastolic dysfunction - Continue to follow daily weights and strict intake and output - Low-sodium diet discussed with patient and family member.   6-hyperlipidemia - Continue statin and the use of zetia .   7-GERD - Continue PPI   8-history of coronary artery disease - No anginal symptoms currently present - Continue home medication therapy and outpatient follow-up with cardiology service.     Discharged home in stable condition.  Discharge Diagnoses:  Active Problems:   Essential hypertension   Paroxysmal atrial fibrillation (HCC)   Coronary artery disease   Obesity, class 1   Epistaxis   Stage 3a chronic kidney disease (HCC)   Hypertrophic obstructive cardiomyopathy (HCC)   Symptomatic anemia   Acute blood loss anemia    Discharge Instructions  Discharge Instructions     Ambulatory referral to ENT   Complete by: As directed  Recurrent epistaxis on anticoagulant   Discharge instructions   Complete by: As directed    1.  Continue to hold Eliquis .  Resume on 2/6 if no  further bleeding 2.  Follow-up with ENT.  Referral will be placed 3.  Return to the ER if uncontrolled nasal bleeding   Increase activity slowly   Complete by: As directed       Allergies as of 07/31/2024   No Known Allergies      Medication List     PAUSE taking these medications    apixaban  5 MG Tabs tablet Wait to take this until: August 02, 2024 Commonly known as: ELIQUIS  Take 1 tablet (5 mg total) by mouth 2 (two) times daily.       TAKE these medications    acetaminophen  325 MG tablet Commonly known as: TYLENOL  Take 2 tablets (650 mg total) by mouth every 6 (six) hours as needed for mild pain, fever or headache (or Fever >/= 101).   alendronate 70 MG tablet Commonly known as: FOSAMAX Take 70 mg by mouth every Wednesday.   allopurinol  300 MG tablet Commonly known as: ZYLOPRIM  Take 300 mg by mouth daily.   atorvastatin  10 MG tablet Commonly known as: LIPITOR Take 10 mg by mouth at bedtime.   D3-1000 25 MCG (1000 UT) tablet Generic drug: Cholecalciferol  Take 1,000 Units by mouth daily.   empagliflozin  10 MG Tabs tablet Commonly known as: JARDIANCE  Take 1 tablet (10 mg total) by mouth daily.   ezetimibe  10 MG tablet Commonly known as: ZETIA  Take 10 mg by mouth daily.   ferrous sulfate  325 (65 FE) MG tablet Take 325 mg by mouth 2 (two) times daily.   fluticasone  50 MCG/ACT nasal spray Commonly known as: FLONASE  Place 1 spray into both nostrils daily as needed for allergies.   folic acid  1 MG tablet Commonly known as: FOLVITE  Take 1 mg by mouth daily.   furosemide  20 MG tablet Commonly known as: LASIX  Take 20 mg by mouth daily.   loratadine  10 MG tablet Commonly known as: CLARITIN  Take 1 tablet (10 mg total) by mouth daily.   methotrexate  2.5 MG tablet Commonly known as: RHEUMATREX Take 20 mg by mouth every Wednesday.   metoprolol  tartrate 25 MG tablet Commonly known as: LOPRESSOR  Take 1 tablet (25 mg total) by mouth 2 (two) times  daily.   nitroGLYCERIN  0.4 MG SL tablet Commonly known as: NITROSTAT  Place 1 tablet (0.4 mg total) under the tongue every 5 (five) minutes as needed for chest pain.   pantoprazole  40 MG tablet Commonly known as: PROTONIX  Take 1 tablet (40 mg total) by mouth daily before breakfast.   Restasis  0.05 % ophthalmic emulsion Generic drug: cycloSPORINE  Place 1 drop into both eyes 2 (two) times daily.   sodium chloride  0.65 % nasal spray Commonly known as: OCEAN Place 1 spray into the nose every 2 (two) hours as needed for congestion (moisture).   spironolactone  25 MG tablet Commonly known as: ALDACTONE  Take 0.5 tablets (12.5 mg total) by mouth daily.   sucralfate  1 g tablet Commonly known as: CARAFATE  Take 1 g by mouth 2 (two) times daily.        Follow-up Information     Nsumanganyi, Kalombo Cesar, NP. Schedule an appointment as soon as possible for a visit in 1 week(s).   Contact information: [address] [place] [address] [phone]         [facility] Follow up in 1  week(s).   Specialty: Otolaryngology Contact information: [address] [place]  [address] [phone] Additional information: Satellite Office - live on EPIC               Allergies[1]  Consultations:    Procedures/Studies: DG Chest Port 1 View Result Date: 07/29/2024 EXAM: 1 VIEW XRAY OF THE CHEST 07/29/2024 10:20:00 PM COMPARISON: 01/01/2024 CLINICAL HISTORY: Shortness of breath. FINDINGS: LUNGS AND PLEURA: Left base airspace disease. Small left pleural effusion. No pneumothorax. HEART AND MEDIASTINUM: Cardiomegaly. Calcified aorta. BONES AND SOFT TISSUES: No acute osseous abnormality. IMPRESSION: 1. Left lower lung pneumonia and small left pleural effusion. Electronically signed by: Norman Gatlin MD 07/29/2024 10:31 PM EST RP Workstation: [id number]   MM 3D SCREENING MAMMOGRAM BILATERAL BREAST Result Date:  07/29/2024 CLINICAL DATA:  Screening. EXAM: DIGITAL SCREENING BILATERAL MAMMOGRAM WITH TOMOSYNTHESIS AND CAD TECHNIQUE: Bilateral screening digital craniocaudal and mediolateral oblique mammograms were obtained. Bilateral screening digital breast tomosynthesis was performed. The images were evaluated with computer-aided detection. COMPARISON:  Previous exam(s). ACR Breast Density Category b: There are scattered areas of fibroglandular density. FINDINGS: There are no findings suspicious for malignancy. IMPRESSION: No mammographic evidence of malignancy. A result letter of this screening mammogram will be mailed directly to the patient. RECOMMENDATION: Screening mammogram in one year. (Code:[id number]) BI-RADS CATEGORY  1: Negative. Electronically Signed   By: Norleen Croak M.D.   On: 07/29/2024 15:36      Subjective:   Discharge Exam: Vitals:   07/31/24 0826 07/31/24 1325  BP: 128/68 119/63  Pulse: 74   Resp: 18   Temp: 98.1 F (36.7 C)   SpO2: 96%     General: Pt is alert, awake, not in acute distress Cardiovascular: rate controlled, S1/S2 + Respiratory: bilateral decreased breath sounds at bases Abdominal: Soft, NT, ND, bowel sounds + Extremities: no edema, no cyanosis    The results of significant diagnostics from this hospitalization (including imaging, microbiology, ancillary and laboratory) are listed below for reference.     Microbiology: Recent Results (from the past 240 hours)  Culture, blood (routine x 2)     Status: None (Preliminary result)   Collection Time: 07/29/24 11:32 PM   Specimen: BLOOD  Result Value Ref Range Status   Specimen Description BLOOD LEFT ANTECUBITAL  Final   Special Requests   Final    BOTTLES DRAWN AEROBIC AND ANAEROBIC Blood Culture adequate volume   Culture   Final    NO GROWTH 2 DAYS Performed at [facility], [address], [place], [place] [address]    Report Status PENDING  Incomplete  Culture, blood (routine x 2)     Status: None  (Preliminary result)   Collection Time: 07/29/24 11:32 PM   Specimen: BLOOD  Result Value Ref Range Status   Specimen Description BLOOD BLOOD LEFT HAND  Final   Special Requests   Final    BOTTLES DRAWN AEROBIC AND ANAEROBIC Blood Culture adequate volume   Culture   Final    NO GROWTH 2 DAYS Performed at [facility], [address] [place], [place] [address]    Report Status PENDING  Incomplete  Resp panel by RT-PCR (RSV, Flu A&B, Covid) Anterior Nasal Swab     Status: None   Collection Time: 07/30/24 12:45 AM   Specimen: Anterior Nasal Swab  Result Value Ref Range Status   SARS Coronavirus 2 by RT PCR NEGATIVE NEGATIVE Final    Comment: (NOTE) SARS-CoV-2 target nucleic acids are NOT DETECTED.  The  SARS-CoV-2 RNA is generally detectable in upper respiratory specimens during the acute phase of infection. The lowest concentration of SARS-CoV-2 viral copies this assay can detect is 138 copies/mL. A negative result does not preclude SARS-Cov-2 infection and should not be used as the sole basis for treatment or other patient management decisions. A negative result may occur with  improper specimen collection/handling, submission of specimen other than nasopharyngeal swab, presence of viral mutation(s) within the areas targeted by this assay, and inadequate number of viral copies(<138 copies/mL). A negative result must be combined with clinical observations, patient history, and epidemiological information. The expected result is Negative.  Fact Sheet for Patients:  bloggercourse.com  Fact Sheet for Healthcare Providers:  seriousbroker.it  This test is no t yet approved or cleared by the [place]  FDA and  has been authorized for detection and/or diagnosis of SARS-CoV-2 by FDA under an Emergency Use Authorization (EUA). This EUA will remain  in effect (meaning this test can be used) for the duration of the COVID-19 declaration  under Section 564(b)(1) of the Act, 21 U.S.C.section 360bbb-3(b)(1), unless the authorization is terminated  or revoked sooner.       Influenza A by PCR NEGATIVE NEGATIVE Final   Influenza B by PCR NEGATIVE NEGATIVE Final    Comment: (NOTE) The Xpert Xpress SARS-CoV-2/FLU/RSV plus assay is intended as an aid in the diagnosis of influenza from Nasopharyngeal swab specimens and should not be used as a sole basis for treatment. Nasal washings and aspirates are unacceptable for Xpert Xpress SARS-CoV-2/FLU/RSV testing.  Fact Sheet for Patients: bloggercourse.com  Fact Sheet for Healthcare Providers: seriousbroker.it  This test is not yet approved or cleared by the [place]  FDA and has been authorized for detection and/or diagnosis of SARS-CoV-2 by FDA under an Emergency Use Authorization (EUA). This EUA will remain in effect (meaning this test can be used) for the duration of the COVID-19 declaration under Section 564(b)(1) of the Act, 21 U.S.C. section 360bbb-3(b)(1), unless the authorization is terminated or revoked.     Resp Syncytial Virus by PCR NEGATIVE NEGATIVE Final    Comment: (NOTE) Fact Sheet for Patients: bloggercourse.com  Fact Sheet for Healthcare Providers: seriousbroker.it  This test is not yet approved or cleared by the [place]  FDA and has been authorized for detection and/or diagnosis of SARS-CoV-2 by FDA under an Emergency Use Authorization (EUA). This EUA will remain in effect (meaning this test can be used) for the duration of the COVID-19 declaration under Section 564(b)(1) of the Act, 21 U.S.C. section 360bbb-3(b)(1), unless the authorization is terminated or revoked.  Performed at [facility], [address] [place], [place] [address]      Labs: BNP (last 3 results) Recent Labs    01/01/24 0119  BNP 595.0*   Basic Metabolic  Panel: Recent Labs  Lab 07/29/24 2230 07/30/24 0425  NA 141 140  K 4.4 4.2  CL 110 111  CO2 23 20*  GLUCOSE 128* 99  BUN 19 18  CREATININE 0.77 0.63  CALCIUM  8.3* 7.9*  MG  --  2.2  PHOS  --  3.2   Liver Function Tests: No results for input(s): AST, ALT, ALKPHOS, BILITOT, PROT, ALBUMIN in the last 168 hours. No results for input(s): LIPASE, AMYLASE in the last 168 hours. No results for input(s): AMMONIA in the last 168 hours. CBC: Recent Labs  Lab 07/29/24 2230 07/30/24 0425 07/31/24 0534  WBC 4.8 3.8* 4.6  NEUTROABS 3.4 2.5  --   HGB 8.4* 7.1*  9.5*  HCT 26.1* 22.7* 28.2*  MCV 104.8* 105.6* 94.6  PLT 122* 115* 99*   Cardiac Enzymes: No results for input(s): CKTOTAL, CKMB, CKMBINDEX, TROPONINI in the last 168 hours. BNP: Invalid input(s): POCBNP CBG: No results for input(s): GLUCAP in the last 168 hours. D-Dimer No results for input(s): DDIMER in the last 72 hours. Hgb A1c No results for input(s): HGBA1C in the last 72 hours. Lipid Profile No results for input(s): CHOL, HDL, LDLCALC, TRIG, CHOLHDL, LDLDIRECT in the last 72 hours. Thyroid  function studies No results for input(s): TSH, T4TOTAL, T3FREE, THYROIDAB in the last 72 hours.  Invalid input(s): FREET3 Anemia work up No results for input(s): VITAMINB12, FOLATE, FERRITIN, TIBC, IRON, RETICCTPCT in the last 72 hours. Urinalysis    Component Value Date/Time   COLORURINE YELLOW 04/28/2024 0008   APPEARANCEUR CLOUDY (A) 04/28/2024 0008   LABSPEC 1.015 04/28/2024 0008   PHURINE 6.0 04/28/2024 0008   GLUCOSEU >=500 (A) 04/28/2024 0008   HGBUR LARGE (A) 04/28/2024 0008   BILIRUBINUR NEGATIVE 04/28/2024 0008   KETONESUR NEGATIVE 04/28/2024 0008   PROTEINUR 30 (A) 04/28/2024 0008   NITRITE POSITIVE (A) 04/28/2024 0008   LEUKOCYTESUR LARGE (A) 04/28/2024 0008   Sepsis Labs Recent Labs  Lab 07/29/24 2230 07/30/24 0425 07/31/24 0534  WBC  4.8 3.8* 4.6   Microbiology Recent Results (from the past 240 hours)  Culture, blood (routine x 2)     Status: None (Preliminary result)   Collection Time: 07/29/24 11:32 PM   Specimen: BLOOD  Result Value Ref Range Status   Specimen Description BLOOD LEFT ANTECUBITAL  Final   Special Requests   Final    BOTTLES DRAWN AEROBIC AND ANAEROBIC Blood Culture adequate volume   Culture   Final    NO GROWTH 2 DAYS Performed at [facility], [address], [place], [place] [address]    Report Status PENDING  Incomplete  Culture, blood (routine x 2)     Status: None (Preliminary result)   Collection Time: 07/29/24 11:32 PM   Specimen: BLOOD  Result Value Ref Range Status   Specimen Description BLOOD BLOOD LEFT HAND  Final   Special Requests   Final    BOTTLES DRAWN AEROBIC AND ANAEROBIC Blood Culture adequate volume   Culture   Final    NO GROWTH 2 DAYS Performed at [facility], [address] [place], [place] [address]    Report Status PENDING  Incomplete  Resp panel by RT-PCR (RSV, Flu A&B, Covid) Anterior Nasal Swab     Status: None   Collection Time: 07/30/24 12:45 AM   Specimen: Anterior Nasal Swab  Result Value Ref Range Status   SARS Coronavirus 2 by RT PCR NEGATIVE NEGATIVE Final    Comment: (NOTE) SARS-CoV-2 target nucleic acids are NOT DETECTED.  The SARS-CoV-2 RNA is generally detectable in upper respiratory specimens during the acute phase of infection. The lowest concentration of SARS-CoV-2 viral copies this assay can detect is 138 copies/mL. A negative result does not preclude SARS-Cov-2 infection and should not be used as the sole basis for treatment or other patient management decisions. A negative result may occur with  improper specimen collection/handling, submission of specimen other than nasopharyngeal swab, presence of viral mutation(s) within the areas targeted by this assay, and inadequate number of viral copies(<138 copies/mL). A negative result must be  combined with clinical observations, patient history, and epidemiological information. The expected result is Negative.  Fact Sheet for Patients:  bloggercourse.com  Fact Sheet for Healthcare  Providers:  seriousbroker.it  This test is no t yet approved or cleared by the [place]  FDA and  has been authorized for detection and/or diagnosis of SARS-CoV-2 by FDA under an Emergency Use Authorization (EUA). This EUA will remain  in effect (meaning this test can be used) for the duration of the COVID-19 declaration under Section 564(b)(1) of the Act, 21 U.S.C.section 360bbb-3(b)(1), unless the authorization is terminated  or revoked sooner.       Influenza A by PCR NEGATIVE NEGATIVE Final   Influenza B by PCR NEGATIVE NEGATIVE Final    Comment: (NOTE) The Xpert Xpress SARS-CoV-2/FLU/RSV plus assay is intended as an aid in the diagnosis of influenza from Nasopharyngeal swab specimens and should not be used as a sole basis for treatment. Nasal washings and aspirates are unacceptable for Xpert Xpress SARS-CoV-2/FLU/RSV testing.  Fact Sheet for Patients: bloggercourse.com  Fact Sheet for Healthcare Providers: seriousbroker.it  This test is not yet approved or cleared by the [place]  FDA and has been authorized for detection and/or diagnosis of SARS-CoV-2 by FDA under an Emergency Use Authorization (EUA). This EUA will remain in effect (meaning this test can be used) for the duration of the COVID-19 declaration under Section 564(b)(1) of the Act, 21 U.S.C. section 360bbb-3(b)(1), unless the authorization is terminated or revoked.     Resp Syncytial Virus by PCR NEGATIVE NEGATIVE Final    Comment: (NOTE) Fact Sheet for Patients: bloggercourse.com  Fact Sheet for Healthcare Providers: seriousbroker.it  This test is not yet  approved or cleared by the [place]  FDA and has been authorized for detection and/or diagnosis of SARS-CoV-2 by FDA under an Emergency Use Authorization (EUA). This EUA will remain in effect (meaning this test can be used) for the duration of the COVID-19 declaration under Section 564(b)(1) of the Act, 21 U.S.C. section 360bbb-3(b)(1), unless the authorization is terminated or revoked.  Performed at [facility], [address], [place], [place] [address]      Time coordinating discharge: 35 minutes  SIGNED:   Derryl Duval, MD  [facility] 07/31/2024, 4:05 PM       [1] No Known Allergies

## 2024-07-31 NOTE — Progress Notes (Unsigned)
 {  Select_TRH_Note:[id number]}

## 2024-07-31 NOTE — Plan of Care (Addendum)
 Pt is alert and oriented x 4. No bleeding noted to nares. Pain level 0 SCD on and in place. Blood completed this shift. Labs this am. Tele called during shift and reported pt had 2 degree type 1 rhythm. Assessed pt. RA was on right abdomen. Corrected. Went back to check current rhythm and previous noted rhythm. P waves march out with qrs no prolonged p wave noted. Called tele to notify that it looked like pause. NP J daniels notified no additional orders. Possibly related to tele lead misplaced after getting up.  Problem: Education: Goal: Knowledge of General Education information will improve Description: Including pain rating scale, medication(s)/side effects and non-pharmacologic comfort measures Outcome: Progressing   Problem: Health Behavior/Discharge Planning: Goal: Ability to manage health-related needs will improve Outcome: Progressing   Problem: Clinical Measurements: Goal: Ability to maintain clinical measurements within normal limits will improve Outcome: Progressing Goal: Will remain free from infection Outcome: Progressing Goal: Diagnostic test results will improve Outcome: Progressing Goal: Respiratory complications will improve Outcome: Progressing Goal: Cardiovascular complication will be avoided Outcome: Progressing   Problem: Activity: Goal: Risk for activity intolerance will decrease Outcome: Progressing   Problem: Nutrition: Goal: Adequate nutrition will be maintained Outcome: Progressing   Problem: Coping: Goal: Level of anxiety will decrease Outcome: Progressing   Problem: Elimination: Goal: Will not experience complications related to bowel motility Outcome: Progressing Goal: Will not experience complications related to urinary retention Outcome: Progressing   Problem: Pain Managment: Goal: General experience of comfort will improve and/or be controlled Outcome: Progressing   Problem: Safety: Goal: Ability to remain free from injury will  improve Outcome: Progressing   Problem: Skin Integrity: Goal: Risk for impaired skin integrity will decrease Outcome: Progressing

## 2024-08-02 LAB — CULTURE, BLOOD (ROUTINE X 2)
Culture: NO GROWTH
Culture: NO GROWTH
Special Requests: ADEQUATE
Special Requests: ADEQUATE

## 2024-08-20 ENCOUNTER — Institutional Professional Consult (permissible substitution) (INDEPENDENT_AMBULATORY_CARE_PROVIDER_SITE_OTHER): Admitting: Otolaryngology

## 2025-01-08 ENCOUNTER — Inpatient Hospital Stay

## 2025-01-15 ENCOUNTER — Inpatient Hospital Stay: Admitting: Oncology
